# Patient Record
Sex: Male | Born: 1940 | Race: Black or African American | Hispanic: No | Marital: Married | Smoking: Former smoker
Health system: Southern US, Community
[De-identification: ages and names within clinical notes are randomized; demographics above are authoritative.]

## PROBLEM LIST (undated history)

## (undated) DIAGNOSIS — J449 Chronic obstructive pulmonary disease, unspecified: Secondary | ICD-10-CM

## (undated) DIAGNOSIS — R0902 Hypoxemia: Secondary | ICD-10-CM

## (undated) DIAGNOSIS — N179 Acute kidney failure, unspecified: Secondary | ICD-10-CM

## (undated) DIAGNOSIS — I959 Hypotension, unspecified: Secondary | ICD-10-CM

## (undated) DIAGNOSIS — R079 Chest pain, unspecified: Secondary | ICD-10-CM

## (undated) DIAGNOSIS — E119 Type 2 diabetes mellitus without complications: Secondary | ICD-10-CM

## (undated) DIAGNOSIS — G4733 Obstructive sleep apnea (adult) (pediatric): Secondary | ICD-10-CM

## (undated) DIAGNOSIS — I739 Peripheral vascular disease, unspecified: Secondary | ICD-10-CM

## (undated) DIAGNOSIS — I509 Heart failure, unspecified: Secondary | ICD-10-CM

## (undated) DIAGNOSIS — J40 Bronchitis, not specified as acute or chronic: Secondary | ICD-10-CM

## (undated) DIAGNOSIS — F0390 Unspecified dementia without behavioral disturbance: Secondary | ICD-10-CM

## (undated) DIAGNOSIS — J9600 Acute respiratory failure, unspecified whether with hypoxia or hypercapnia: Secondary | ICD-10-CM

## (undated) DIAGNOSIS — J9610 Chronic respiratory failure, unspecified whether with hypoxia or hypercapnia: Secondary | ICD-10-CM

## (undated) DIAGNOSIS — R600 Localized edema: Secondary | ICD-10-CM

## (undated) DIAGNOSIS — I10 Essential (primary) hypertension: Secondary | ICD-10-CM

## (undated) DIAGNOSIS — Z720 Tobacco use: Secondary | ICD-10-CM

## (undated) DIAGNOSIS — E785 Hyperlipidemia, unspecified: Secondary | ICD-10-CM

## (undated) HISTORY — DX: Hypotension, unspecified: I95.9

## (undated) HISTORY — DX: Obstructive sleep apnea (adult) (pediatric): G47.33

## (undated) HISTORY — DX: Chronic obstructive pulmonary disease, unspecified: J44.9

## (undated) HISTORY — DX: Hyperlipidemia, unspecified: E78.5

## (undated) HISTORY — DX: Hypoxemia: R09.02

## (undated) HISTORY — DX: Chronic respiratory failure, unspecified whether with hypoxia or hypercapnia: J96.10

## (undated) HISTORY — DX: Peripheral vascular disease, unspecified: I73.9

## (undated) HISTORY — DX: Essential (primary) hypertension: I10

## (undated) HISTORY — DX: Acute respiratory failure, unspecified whether with hypoxia or hypercapnia: J96.00

## (undated) HISTORY — DX: Heart failure, unspecified: I50.9

## (undated) HISTORY — DX: Type 2 diabetes mellitus without complications: E11.9

## (undated) HISTORY — DX: Acute kidney failure, unspecified: N17.9

## (undated) HISTORY — DX: Localized edema: R60.0

## (undated) HISTORY — DX: Bronchitis, not specified as acute or chronic: J40

## (undated) HISTORY — DX: Tobacco use: Z72.0

## (undated) HISTORY — PX: COLON SURGERY: SHX602

## (undated) HISTORY — DX: Chest pain, unspecified: R07.9

---

## 1997-10-20 ENCOUNTER — Emergency Department (HOSPITAL_COMMUNITY): Admission: EM | Admit: 1997-10-20 | Discharge: 1997-10-20 | Payer: Self-pay | Admitting: Emergency Medicine

## 1997-10-31 ENCOUNTER — Emergency Department (HOSPITAL_COMMUNITY): Admission: EM | Admit: 1997-10-31 | Discharge: 1997-10-31 | Payer: Self-pay | Admitting: Emergency Medicine

## 1997-11-03 ENCOUNTER — Emergency Department (HOSPITAL_COMMUNITY): Admission: EM | Admit: 1997-11-03 | Discharge: 1997-11-03 | Payer: Self-pay | Admitting: Emergency Medicine

## 2004-07-18 ENCOUNTER — Ambulatory Visit: Payer: Self-pay | Admitting: Oncology

## 2005-07-22 ENCOUNTER — Encounter: Admission: RE | Admit: 2005-07-22 | Discharge: 2005-07-22 | Payer: Self-pay | Admitting: Orthopedic Surgery

## 2009-01-27 ENCOUNTER — Emergency Department (HOSPITAL_COMMUNITY): Admission: EM | Admit: 2009-01-27 | Discharge: 2009-01-27 | Payer: Self-pay | Admitting: Emergency Medicine

## 2009-01-27 ENCOUNTER — Encounter (INDEPENDENT_AMBULATORY_CARE_PROVIDER_SITE_OTHER): Payer: Self-pay | Admitting: Emergency Medicine

## 2009-01-27 ENCOUNTER — Ambulatory Visit: Payer: Self-pay | Admitting: Vascular Surgery

## 2010-06-18 ENCOUNTER — Emergency Department (HOSPITAL_COMMUNITY): Payer: MEDICARE

## 2010-06-18 ENCOUNTER — Emergency Department (HOSPITAL_COMMUNITY)
Admission: EM | Admit: 2010-06-18 | Discharge: 2010-06-18 | Disposition: A | Discharge status: Home / Self Care | Payer: MEDICARE | Attending: Emergency Medicine | Admitting: Emergency Medicine

## 2010-06-18 DIAGNOSIS — M25519 Pain in unspecified shoulder: Secondary | ICD-10-CM | POA: Insufficient documentation

## 2010-06-18 DIAGNOSIS — R079 Chest pain, unspecified: Secondary | ICD-10-CM | POA: Insufficient documentation

## 2010-06-18 DIAGNOSIS — R109 Unspecified abdominal pain: Secondary | ICD-10-CM | POA: Insufficient documentation

## 2010-06-18 DIAGNOSIS — E119 Type 2 diabetes mellitus without complications: Secondary | ICD-10-CM | POA: Insufficient documentation

## 2010-06-18 DIAGNOSIS — I10 Essential (primary) hypertension: Secondary | ICD-10-CM | POA: Insufficient documentation

## 2010-06-18 LAB — TROPONIN I: Troponin I: 0.02 ng/mL (ref 0.00–0.06)

## 2010-06-18 LAB — URINALYSIS, ROUTINE W REFLEX MICROSCOPIC
Bilirubin Urine: NEGATIVE
Ketones, ur: NEGATIVE mg/dL
Specific Gravity, Urine: 1.028 (ref 1.005–1.030)
Urine Glucose, Fasting: NEGATIVE mg/dL
Urobilinogen, UA: 1 mg/dL (ref 0.0–1.0)

## 2010-06-18 LAB — COMPREHENSIVE METABOLIC PANEL
Albumin: 4.1 g/dL (ref 3.5–5.2)
GFR calc non Af Amer: >60 mL/min (ref >60)
Potassium: 3.6 mEq/L (ref 3.5–5.1)
Sodium: 139 mEq/L (ref 135–145)

## 2010-06-18 LAB — CBC
HCT: 53.3 % — ABNORMAL HIGH (ref 39.0–52.0)
Hemoglobin: 17.4 g/dL — ABNORMAL HIGH (ref 13.0–17.0)
MCH: 28 pg (ref 26.0–34.0)
MCHC: 32.6 g/dL (ref 30.0–36.0)
RDW: 12.7 % (ref 11.5–15.5)

## 2010-06-18 LAB — POCT CARDIAC MARKERS
CKMB, poc: 12.9 ng/mL (ref 1.0–8.0)
CKMB, poc: 4.2 ng/mL (ref 1.0–8.0)
Myoglobin, poc: 124 ng/mL (ref 12–200)

## 2010-06-18 LAB — DIFFERENTIAL
Basophils Absolute: 0 10*3/uL (ref 0.0–0.1)
Eosinophils Absolute: 0 10*3/uL (ref 0.0–0.7)
Lymphocytes Relative: 11 % — ABNORMAL LOW (ref 12–46)
Neutro Abs: 11.6 10*3/uL — ABNORMAL HIGH (ref 1.7–7.7)

## 2010-06-18 LAB — URINE MICROSCOPIC-ADD ON

## 2010-06-18 LAB — LIPASE, BLOOD: Lipase: 15 U/L (ref 11–59)

## 2010-06-18 LAB — CK TOTAL AND CKMB (NOT AT ARMC): CK, MB: 18.2 ng/mL (ref 0.3–4.0)

## 2010-08-22 LAB — GLUCOSE, CAPILLARY: Glucose-Capillary: 222 mg/dL — ABNORMAL HIGH (ref 70–99)

## 2010-11-04 ENCOUNTER — Inpatient Hospital Stay (HOSPITAL_COMMUNITY)
Admission: EM | Admit: 2010-11-04 | Discharge: 2010-11-11 | DRG: 208 | Disposition: A | Discharge status: Home Health | Payer: Medicare Other | Attending: Emergency Medicine | Admitting: Emergency Medicine

## 2010-11-04 ENCOUNTER — Emergency Department (HOSPITAL_COMMUNITY): Payer: Medicare Other

## 2010-11-04 DIAGNOSIS — I10 Essential (primary) hypertension: Secondary | ICD-10-CM | POA: Diagnosis present

## 2010-11-04 DIAGNOSIS — J9600 Acute respiratory failure, unspecified whether with hypoxia or hypercapnia: Principal | ICD-10-CM | POA: Diagnosis present

## 2010-11-04 DIAGNOSIS — Z91199 Patient's noncompliance with other medical treatment and regimen due to unspecified reason: Secondary | ICD-10-CM

## 2010-11-04 DIAGNOSIS — N139 Obstructive and reflux uropathy, unspecified: Secondary | ICD-10-CM | POA: Diagnosis present

## 2010-11-04 DIAGNOSIS — J441 Chronic obstructive pulmonary disease with (acute) exacerbation: Secondary | ICD-10-CM | POA: Diagnosis present

## 2010-11-04 DIAGNOSIS — R0902 Hypoxemia: Secondary | ICD-10-CM | POA: Diagnosis present

## 2010-11-04 DIAGNOSIS — J9620 Acute and chronic respiratory failure, unspecified whether with hypoxia or hypercapnia: Secondary | ICD-10-CM

## 2010-11-04 DIAGNOSIS — G4733 Obstructive sleep apnea (adult) (pediatric): Secondary | ICD-10-CM | POA: Diagnosis present

## 2010-11-04 DIAGNOSIS — R4182 Altered mental status, unspecified: Secondary | ICD-10-CM | POA: Diagnosis present

## 2010-11-04 DIAGNOSIS — Z9119 Patient's noncompliance with other medical treatment and regimen: Secondary | ICD-10-CM

## 2010-11-04 DIAGNOSIS — F17200 Nicotine dependence, unspecified, uncomplicated: Secondary | ICD-10-CM | POA: Diagnosis present

## 2010-11-04 DIAGNOSIS — E119 Type 2 diabetes mellitus without complications: Secondary | ICD-10-CM | POA: Diagnosis present

## 2010-11-04 DIAGNOSIS — I959 Hypotension, unspecified: Secondary | ICD-10-CM | POA: Diagnosis present

## 2010-11-04 LAB — CBC
Hemoglobin: 16.3 g/dL (ref 13.0–17.0)
MCH: 27.7 pg (ref 26.0–34.0)
MCHC: 31.5 g/dL (ref 30.0–36.0)
Platelets: ADEQUATE 10*3/uL (ref 150–400)
RDW: 12.9 % (ref 11.5–15.5)

## 2010-11-04 LAB — BASIC METABOLIC PANEL
BUN: 19 mg/dL (ref 6–23)
CO2: 32 mEq/L (ref 19–32)
Creatinine, Ser: 1.09 mg/dL (ref 0.50–1.35)
GFR calc Af Amer: >60 mL/min (ref >60)
GFR calc non Af Amer: >60 mL/min (ref >60)
Glucose, Bld: 217 mg/dL — ABNORMAL HIGH (ref 70–99)

## 2010-11-04 LAB — URINALYSIS, ROUTINE W REFLEX MICROSCOPIC
Leukocytes, UA: NEGATIVE
Specific Gravity, Urine: 1.03 (ref 1.005–1.030)
Urobilinogen, UA: 1 mg/dL (ref 0.0–1.0)

## 2010-11-04 LAB — BLOOD GAS, ARTERIAL
Acid-Base Excess: 3.8 mmol/L — ABNORMAL HIGH (ref 0.0–2.0)
Bicarbonate: 36.8 mEq/L — ABNORMAL HIGH (ref 20.0–24.0)
Drawn by: 257701
Expiratory PAP: 6
FIO2: 0.5 %
FIO2: 0.6 %
O2 Saturation: 96 %
Patient temperature: 98.6
TCO2: 34.3 mmol/L (ref 0–100)
TCO2: 27.7 mmol/L (ref 0–100)
pCO2 arterial: 61.1 mmHg (ref 35.0–45.0)
pO2, Arterial: 85.1 mmHg (ref 80.0–100.0)

## 2010-11-04 LAB — GLUCOSE, CAPILLARY: Glucose-Capillary: 232 mg/dL — ABNORMAL HIGH (ref 70–99)

## 2010-11-04 LAB — PROCALCITONIN: Procalcitonin: 0.12 ng/mL

## 2010-11-04 LAB — CK TOTAL AND CKMB (NOT AT ARMC)
CK, MB: 30.4 ng/mL (ref 0.3–4.0)
Relative Index: 1.2 (ref 0.0–2.5)

## 2010-11-04 LAB — URINE MICROSCOPIC-ADD ON

## 2010-11-04 LAB — PROTIME-INR
INR: 1.07 (ref 0.00–1.49)
Prothrombin Time: 14.1 seconds (ref 11.6–15.2)

## 2010-11-04 LAB — APTT: aPTT: 30 seconds (ref 24–37)

## 2010-11-04 LAB — ACETAMINOPHEN LEVEL: Acetaminophen (Tylenol), Serum: <15 ug/mL (ref 10–30)

## 2010-11-04 LAB — PRO B NATRIURETIC PEPTIDE: Pro B Natriuretic peptide (BNP): 84 pg/mL (ref 0–125)

## 2010-11-04 LAB — DIFFERENTIAL
Basophils Relative: 0 % (ref 0–1)
Neutrophils Relative %: 74 % (ref 43–77)

## 2010-11-04 LAB — TROPONIN I: Troponin I: <0.3 ng/mL (ref <0.30)

## 2010-11-04 MED ORDER — IOHEXOL 300 MG/ML  SOLN
100.0000 mL | Freq: Once | INTRAMUSCULAR | Status: AC | PRN
  Administered 2010-11-04: 100 mL via INTRAVENOUS

## 2010-11-05 ENCOUNTER — Inpatient Hospital Stay (HOSPITAL_COMMUNITY): Payer: Medicare Other

## 2010-11-05 DIAGNOSIS — I10 Essential (primary) hypertension: Secondary | ICD-10-CM

## 2010-11-05 DIAGNOSIS — J9600 Acute respiratory failure, unspecified whether with hypoxia or hypercapnia: Secondary | ICD-10-CM

## 2010-11-05 DIAGNOSIS — I359 Nonrheumatic aortic valve disorder, unspecified: Secondary | ICD-10-CM

## 2010-11-05 LAB — LEGIONELLA ANTIGEN, URINE

## 2010-11-05 LAB — STREP PNEUMONIAE URINARY ANTIGEN: Strep Pneumo Urinary Antigen: NEGATIVE

## 2010-11-05 LAB — BASIC METABOLIC PANEL
BUN: 25 mg/dL — ABNORMAL HIGH (ref 6–23)
Calcium: 7.4 mg/dL — ABNORMAL LOW (ref 8.4–10.5)
Chloride: 105 mEq/L (ref 96–112)
Creatinine, Ser: 1.28 mg/dL (ref 0.50–1.35)
GFR calc Af Amer: >60 mL/min (ref >60)

## 2010-11-05 LAB — BLOOD GAS, ARTERIAL
Bicarbonate: 28.7 mEq/L — ABNORMAL HIGH (ref 20.0–24.0)
O2 Saturation: 94.1 %
PEEP: 5 cmH2O
pCO2 arterial: 54.9 mmHg — ABNORMAL HIGH (ref 35.0–45.0)
pO2, Arterial: 71.6 mmHg — ABNORMAL LOW (ref 80.0–100.0)

## 2010-11-05 LAB — GLUCOSE, CAPILLARY
Glucose-Capillary: 179 mg/dL — ABNORMAL HIGH (ref 70–99)
Glucose-Capillary: 150 mg/dL — ABNORMAL HIGH (ref 70–99)
Glucose-Capillary: 156 mg/dL — ABNORMAL HIGH (ref 70–99)
Glucose-Capillary: 161 mg/dL — ABNORMAL HIGH (ref 70–99)

## 2010-11-05 LAB — CBC
HCT: 45.1 % (ref 39.0–52.0)
MCH: 27.2 pg (ref 26.0–34.0)
MCV: 89.7 fL (ref 78.0–100.0)
Platelets: 186 10*3/uL (ref 150–400)
RDW: 13.2 % (ref 11.5–15.5)

## 2010-11-05 LAB — CARDIAC PANEL(CRET KIN+CKTOT+MB+TROPI)
Relative Index: 0.8 (ref 0.0–2.5)
Total CK: 1221 U/L — ABNORMAL HIGH (ref 7–232)

## 2010-11-05 LAB — CK TOTAL AND CKMB (NOT AT ARMC)
CK, MB: 17.6 ng/mL (ref 0.3–4.0)
Relative Index: 1.1 (ref 0.0–2.5)

## 2010-11-05 LAB — URINE CULTURE

## 2010-11-05 LAB — MRSA PCR SCREENING: MRSA by PCR: NEGATIVE

## 2010-11-05 LAB — TROPONIN I: Troponin I: <0.3 ng/mL (ref <0.30)

## 2010-11-06 ENCOUNTER — Inpatient Hospital Stay (HOSPITAL_COMMUNITY): Payer: Medicare Other

## 2010-11-06 LAB — CBC
Hemoglobin: 13.1 g/dL (ref 13.0–17.0)
MCH: 27.2 pg (ref 26.0–34.0)
MCHC: 30.6 g/dL (ref 30.0–36.0)
Platelets: 158 10*3/uL (ref 150–400)
RDW: 13.4 % (ref 11.5–15.5)

## 2010-11-06 LAB — CULTURE, RESPIRATORY W GRAM STAIN

## 2010-11-06 LAB — GLUCOSE, CAPILLARY
Glucose-Capillary: 164 mg/dL — ABNORMAL HIGH (ref 70–99)
Glucose-Capillary: 168 mg/dL — ABNORMAL HIGH (ref 70–99)
Glucose-Capillary: 190 mg/dL — ABNORMAL HIGH (ref 70–99)

## 2010-11-06 LAB — BLOOD GAS, ARTERIAL
Bicarbonate: 25.8 mEq/L — ABNORMAL HIGH (ref 20.0–24.0)
O2 Saturation: 93.3 %
PEEP: 5 cmH2O
TCO2: 22.9 mmol/L (ref 0–100)
pO2, Arterial: 66.9 mmHg — ABNORMAL LOW (ref 80.0–100.0)

## 2010-11-06 LAB — BASIC METABOLIC PANEL
Calcium: 7.1 mg/dL — ABNORMAL LOW (ref 8.4–10.5)
GFR calc Af Amer: >60 mL/min (ref >60)
GFR calc non Af Amer: >60 mL/min (ref >60)
Glucose, Bld: 151 mg/dL — ABNORMAL HIGH (ref 70–99)
Sodium: 141 mEq/L (ref 135–145)

## 2010-11-07 ENCOUNTER — Inpatient Hospital Stay (HOSPITAL_COMMUNITY): Payer: Medicare Other

## 2010-11-07 LAB — CBC
HCT: 49.1 % (ref 39.0–52.0)
MCV: 87.5 fL (ref 78.0–100.0)
RBC: 5.61 MIL/uL (ref 4.22–5.81)
WBC: 19 10*3/uL — ABNORMAL HIGH (ref 4.0–10.5)

## 2010-11-07 LAB — BASIC METABOLIC PANEL
BUN: 29 mg/dL — ABNORMAL HIGH (ref 6–23)
Chloride: 104 mEq/L (ref 96–112)
Creatinine, Ser: 0.99 mg/dL (ref 0.50–1.35)
GFR calc Af Amer: >60 mL/min (ref >60)
GFR calc non Af Amer: >60 mL/min (ref >60)

## 2010-11-07 LAB — GLUCOSE, CAPILLARY
Glucose-Capillary: 231 mg/dL — ABNORMAL HIGH (ref 70–99)
Glucose-Capillary: 157 mg/dL — ABNORMAL HIGH (ref 70–99)
Glucose-Capillary: 159 mg/dL — ABNORMAL HIGH (ref 70–99)

## 2010-11-08 ENCOUNTER — Inpatient Hospital Stay (HOSPITAL_COMMUNITY): Payer: Medicare Other

## 2010-11-08 LAB — BASIC METABOLIC PANEL
Calcium: 8 mg/dL — ABNORMAL LOW (ref 8.4–10.5)
GFR calc Af Amer: >60 mL/min (ref >60)
GFR calc non Af Amer: >60 mL/min (ref >60)
Glucose, Bld: 205 mg/dL — ABNORMAL HIGH (ref 70–99)
Potassium: 4.3 mEq/L (ref 3.5–5.1)
Sodium: 137 mEq/L (ref 135–145)

## 2010-11-08 LAB — GLUCOSE, CAPILLARY
Glucose-Capillary: 226 mg/dL — ABNORMAL HIGH (ref 70–99)
Glucose-Capillary: 248 mg/dL — ABNORMAL HIGH (ref 70–99)
Glucose-Capillary: 214 mg/dL — ABNORMAL HIGH (ref 70–99)
Glucose-Capillary: 268 mg/dL — ABNORMAL HIGH (ref 70–99)
Glucose-Capillary: 265 mg/dL — ABNORMAL HIGH (ref 70–99)

## 2010-11-09 ENCOUNTER — Inpatient Hospital Stay (HOSPITAL_COMMUNITY): Payer: Medicare Other

## 2010-11-09 DIAGNOSIS — I10 Essential (primary) hypertension: Secondary | ICD-10-CM

## 2010-11-09 DIAGNOSIS — J9600 Acute respiratory failure, unspecified whether with hypoxia or hypercapnia: Secondary | ICD-10-CM

## 2010-11-09 LAB — PROCALCITONIN: Procalcitonin: <0.1 ng/mL

## 2010-11-09 LAB — GLUCOSE, CAPILLARY
Glucose-Capillary: 228 mg/dL — ABNORMAL HIGH (ref 70–99)
Glucose-Capillary: 347 mg/dL — ABNORMAL HIGH (ref 70–99)

## 2010-11-09 LAB — CBC
Hemoglobin: 14.4 g/dL (ref 13.0–17.0)
MCH: 27.3 pg (ref 26.0–34.0)
MCHC: 31.1 g/dL (ref 30.0–36.0)
RDW: 13.5 % (ref 11.5–15.5)

## 2010-11-10 ENCOUNTER — Inpatient Hospital Stay (HOSPITAL_COMMUNITY): Payer: Medicare Other

## 2010-11-10 DIAGNOSIS — J9600 Acute respiratory failure, unspecified whether with hypoxia or hypercapnia: Secondary | ICD-10-CM

## 2010-11-10 DIAGNOSIS — I10 Essential (primary) hypertension: Secondary | ICD-10-CM

## 2010-11-10 LAB — BASIC METABOLIC PANEL
Calcium: 8.4 mg/dL (ref 8.4–10.5)
Chloride: 99 mEq/L (ref 96–112)
Creatinine, Ser: 0.88 mg/dL (ref 0.50–1.35)
GFR calc Af Amer: >60 mL/min (ref >60)
Sodium: 134 mEq/L — ABNORMAL LOW (ref 135–145)

## 2010-11-10 LAB — CULTURE, BLOOD (ROUTINE X 2)
Culture  Setup Time: 201206192333
Culture  Setup Time: 201206192333
Culture: NO GROWTH

## 2010-11-10 LAB — CBC
MCH: 26.9 pg (ref 26.0–34.0)
MCV: 87.1 fL (ref 78.0–100.0)
Platelets: 103 10*3/uL — ABNORMAL LOW (ref 150–400)
RBC: 5.43 MIL/uL (ref 4.22–5.81)
RDW: 13.2 % (ref 11.5–15.5)
WBC: 13.4 10*3/uL — ABNORMAL HIGH (ref 4.0–10.5)

## 2010-11-10 LAB — PRO B NATRIURETIC PEPTIDE: Pro B Natriuretic peptide (BNP): 80.4 pg/mL (ref 0–125)

## 2010-11-10 LAB — GLUCOSE, CAPILLARY
Glucose-Capillary: 213 mg/dL — ABNORMAL HIGH (ref 70–99)
Glucose-Capillary: 224 mg/dL — ABNORMAL HIGH (ref 70–99)

## 2010-11-11 DIAGNOSIS — J9600 Acute respiratory failure, unspecified whether with hypoxia or hypercapnia: Secondary | ICD-10-CM

## 2010-11-11 DIAGNOSIS — I10 Essential (primary) hypertension: Secondary | ICD-10-CM

## 2010-11-11 LAB — CBC
HCT: 46.2 % (ref 39.0–52.0)
MCH: 26.7 pg (ref 26.0–34.0)
MCHC: 30.5 g/dL (ref 30.0–36.0)
MCV: 87.5 fL (ref 78.0–100.0)
Platelets: 102 10*3/uL — ABNORMAL LOW (ref 150–400)
RDW: 13.1 % (ref 11.5–15.5)
WBC: 11.5 10*3/uL — ABNORMAL HIGH (ref 4.0–10.5)

## 2010-11-11 LAB — BASIC METABOLIC PANEL
BUN: 25 mg/dL — ABNORMAL HIGH (ref 6–23)
Calcium: 8.6 mg/dL (ref 8.4–10.5)
Creatinine, Ser: 0.85 mg/dL (ref 0.50–1.35)
GFR calc Af Amer: >60 mL/min (ref >60)
GFR calc non Af Amer: >60 mL/min (ref >60)

## 2010-11-12 LAB — GLUCOSE, CAPILLARY: Glucose-Capillary: 155 mg/dL — ABNORMAL HIGH (ref 70–99)

## 2010-11-18 ENCOUNTER — Encounter: Payer: Self-pay | Admitting: *Deleted

## 2010-11-18 ENCOUNTER — Ambulatory Visit (INDEPENDENT_AMBULATORY_CARE_PROVIDER_SITE_OTHER): Payer: Medicare Other | Admitting: Adult Health

## 2010-11-18 VITALS — BP 108/66 | HR 93 | Temp 98.6°F | Ht 66.0 in | Wt 179.0 lb

## 2010-11-18 DIAGNOSIS — J449 Chronic obstructive pulmonary disease, unspecified: Secondary | ICD-10-CM

## 2010-11-18 NOTE — Progress Notes (Signed)
  Subjective:    Patient ID: Ivan Parks, male    DOB: 1940/06/14, 70 y.o.   MRN: [id number]  HPI 70 yo male seen for initial pulmonary consult 11/04/10 for AECOPD w/ vent depend resp failure . Active smoker.   11/18/2010 Post Hospital  Pt was admitted 6/19-6/26/12 for AECOPD requiring vent support. Admitted with acute resp distress found to have Hypercarbic resp failure and vent support. CT chest and head were neg. 2D echo showed LV hypertrophy, EF of 60%.  TX with IV abx, steroids, and nebs.  He was weaned off vent to O2. Did require HOme O2 for ambulatory desaturations.  Discharged on Advair 250/50 Twice daily  .   Since discharge he is feeling better with decreased dyspnea . No smoking since discharge.   Review of Systems Constitutional:   No  weight loss, night sweats,  Fevers, chills, fatigue, or  lassitude.  HEENT:   No headaches,  Difficulty swallowing,  Tooth/dental problems, or  Sore throat,                No sneezing, itching, ear ache, nasal congestion, post nasal drip,   CV:  No chest pain,  Orthopnea, PND, swelling in lower extremities, anasarca, dizziness, palpitations, syncope.   GI  No heartburn, indigestion, abdominal pain, nausea, vomiting, diarrhea, change in bowel habits, loss of appetite, bloody stools.   Resp: .  No excess mucus, no productive cough,  No non-productive cough,  No coughing up of blood.  No change in color of mucus.  No wheezing.  No chest wall deformity  Skin: no rash or lesions.  GU: no dysuria, change in color of urine, no urgency or frequency.  No flank pain, no hematuria   MS:  No joint pain or swelling.  No decreased range of motion.  No back pain.  Psych:  No change in mood or affect. No depression or anxiety.  No memory loss.         Objective:   Physical Exam GEN: A/Ox3; pleasant , NAD, elderly   HEENT:  [place]/AT,  EACs-clear, TMs-wnl, NOSE-clear, THROAT-clear, no lesions, no postnasal drip or exudate noted.   NECK:  Supple w/  fair ROM; no JVD; normal carotid impulses w/o bruits; no thyromegaly or nodules palpated; no lymphadenopathy.  RESP  Coarse BS w/ no wheezing  no accessory muscle use, no dullness to percussion  CARD:  RRR, no m/r/g  , no peripheral edema, pulses intact, no cyanosis or clubbing.  GI:   Soft & nt; nml bowel sounds; no organomegaly or masses detected.  Musco: Warm bil, no deformities or joint swelling noted.   Neuro: alert, no focal deficits noted.    Skin: Warm, no lesions or rashes         Assessment & Plan:

## 2010-11-18 NOTE — Patient Instructions (Signed)
Continue Advair 250/50 1 puff Twice daily  -brush/rinse and gargle after use.  Continue on O2 2.5 l/m continuously  No smoking  follow up 3 weeks Dr. Delton Coombes with PFTs

## 2010-11-19 DIAGNOSIS — J449 Chronic obstructive pulmonary disease, unspecified: Secondary | ICD-10-CM

## 2010-11-19 HISTORY — DX: Chronic obstructive pulmonary disease, unspecified: J44.9

## 2010-11-19 NOTE — Assessment & Plan Note (Addendum)
Flare >>Pt was admitted 6/19-6/26/12 for AECOPD requiring vent support. Admitted with acute resp distress found to have Hypercarbic resp failure and vent support. CT chest and head were neg. 2D echo showed LV hypertrophy, EF of 60%.  TX with IV abx, steroids, and nebs.  He was weaned off vent to O2. Did require HOme O2 for ambulatory desaturations.  Discharged on Advair 250/50 Twice daily  .  >improving , encouraged on smoking cesstation   Plan  Continue Advair 250/50 1 puff Twice daily  -brush/rinse and gargle after use.  Continue on O2 2.5 l/m continuously  No smoking  follow up 3 weeks Dr. Delton Coombes with PFTs

## 2010-11-20 NOTE — Discharge Summary (Signed)
NAMEADEM, [place]                 ACCOUNT NO.:  [id number]  MEDICAL RECORD NO.:  [id number]  LOCATION:  1435                         FACILITY:  [facility]  PHYSICIAN:  Charlaine Dalton. Sherene Sires, MD, FCCPDATE OF BIRTH:  1940/08/07  DATE OF ADMISSION:  11/04/2010 DATE OF DISCHARGE:  11/11/2010                              DISCHARGE SUMMARY   PULMONOLOGIST:  Dr. Levy Pupa of pulmonary division, will be his follow-up pulmonologist.  Dr. Laurann Montana of [facility].  DISCHARGE DIAGNOSES:  Consists of: 1. 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 2. Altered mental status. 3. Hypotension. 4. Hypertension. 5. Diabetes mellitus. 6. Obstructive uropathy.  HISTORY OF PRESENT ILLNESS:  [name] is a 70 year old African American male, who presented to the Emergency Department of [facility] with acute hypercarbic respiratory failure.  He is intubated by physician and pulmonary critical care was asked to admit.  Very little information was available at the time of admit.  He is known to have purulent drainage without radiographic evidence of pneumonia.  He has been a lifelong smoker and noted to be noncompliance with all medications.  LINES AND TUBES:  Had an endotracheal tube from November 04, 2010 to November 07, 2010.  MICRO DATA:  Negative.  ANTIBIOTICS:  Consist of Avelox until discontinued.  He was on sedation protocol from November 04, 2010 to November 06, 2010.  DIAGNOSTIC STUDIES:  CT of the chest was negative.  CT of the head was negative.  He does have sinus disease.  On November 05, 2010, 2-D echo showed wall thickness, increased left ventricular hypertrophy, ejection fraction of 60%.  LABORATORY DATA:  For further lab data, hemoglobin 41; hematocrit 46.2; platelets are 102,000, down from 108,000, eventually came to 186,000. Sodium 137, potassium 4.3, chloride 97, CO2 is 35, BUN is 35,  creatinine is 0.85, glucose went from 206-92 with initiation of metformin. Troponin I less than 0.30.  CK-MB is 9.6.  CK is 1221.  Calcium is 8.6. PFT's prior to discharge showed only mild airflow obstruction  RADIOGRAPHIC DATA:  Chest x-ray on November 10, 2010 demonstrates significant improvement of bilateral lung aeration.  HOSPITAL COURSE BY DISCHARGE DIAGNOSES: 1. Vent dependent respiratory failure secondary to acute exacerbation     of COPD with purulent tracheobronchitis and hypercarbia in the     setting of continued tobacco abuse:  He was initially admitted to     the hospital and intubated.  He was successfully liberated from     mechanical ventilatory support by November 06, 2010.  He is treated     with steroids and bronchodilators and he was treated for Avelox for     H. flu.  He completed antimicrobial therapy.  He was ambulating in     the hall on room air showed sats of 85%, which documented his need     for oxygen.  He will be discharged on home O2 at 2 liters 24x7.  He     will follow up initially with Dr. Rubye Oaks and then with Dr.     Levy Pupa for continued pulmonary issues. 2.  Altered mental status:  He has a CT of the head that was negative.     His altered mental status resolved on November 07, 2010, although he     does continue to have a somewhat blurred affect. 3. Hypotension, which resolved. 4. Hypertension:  This has been treated with antihypertensive. 5. Diabetes mellitus with steroid exacerbation:  He has been in     control now with Glucophage 1000 mg b.i.d.  He has been instructed     to follow up with his primary care physician Dr. Laurann Montana     within 1-2 weeks.  He will be left on metformin at this time.  He     was being taught be given diabetic teaching and taught how to use     the glucometer and he may be overcome off the metformin in the near     future. 6. Obstructive uropathy:  He had a Foley, which was discontinued.  He     had difficulty  in voiding and the bladder scan was negative.  We     will start him on Flomax with resolution of his uropathy symptoms.  DISCHARGE MEDICATIONS:  Note, he was taking no medications that he was supposed to be taking as an outpatient needed.  NEW MEDICATIONS: 1. Clonidine 0.1 mg twice daily. 2. Doxazosin Cardura 4 mg twice daily. 3. Advair 250/50 one puff inhaled daily. 4. Metformin 500 mg by mouth twice daily and that is for a total of     1000 mg twice daily. 5. Benicar 20 mg by mouth daily.  DIET:  His diet is a low carbohydrate heart healthy diet.  FOLLOWUP:  He is going to follow up with Dr. Rubye Oaks on November 18, 2010 at 3 p.m.  Also, he is to follow up with Dr. Delton Coombes in the near future and he is to follow with Dr. Laurann Montana at [facility] on [address].  DISCHARGE INSTRUCTIONS:  Further, he is going to be on home O2 at 2 liters for 24x7.  He has been given special instructions to check his glucose daily.  CONDITION ON DISCHARGE:  He is being discharged in improved condition.  Note, this was prolonged and difficult discharge taking greater than 70 minutes.     Devra Dopp, MSN, ACNP   ______________________________ Charlaine Dalton. Sherene Sires, MD, FCCP    SM/MEDQ  D:  11/11/2010  T:  11/11/2010  Job:  [id number]  cc:   Leslye Peer, MD [address] [place], [place] [address]  Aram Beecham Dr. Cliffton Asters  Electronically Signed by Devra Dopp MSN ACNP on 11/13/2010 03:11:52 PM Electronically Signed by Sandrea Hughs MD FCCP on 11/20/2010 11:25:21 AM

## 2010-12-04 ENCOUNTER — Encounter: Payer: Self-pay | Admitting: Internal Medicine

## 2010-12-19 ENCOUNTER — Ambulatory Visit (INDEPENDENT_AMBULATORY_CARE_PROVIDER_SITE_OTHER): Payer: Medicare Other | Admitting: Emergency Medicine

## 2010-12-19 ENCOUNTER — Encounter: Payer: Self-pay | Admitting: Emergency Medicine

## 2010-12-19 VITALS — BP 120/78 | HR 102 | Temp 98.3°F | Ht 66.0 in | Wt 181.0 lb

## 2010-12-19 DIAGNOSIS — J449 Chronic obstructive pulmonary disease, unspecified: Secondary | ICD-10-CM

## 2010-12-19 DIAGNOSIS — J4489 Other specified chronic obstructive pulmonary disease: Secondary | ICD-10-CM

## 2010-12-19 LAB — PULMONARY FUNCTION TEST

## 2010-12-19 NOTE — Progress Notes (Signed)
PFT done today. 

## 2010-12-19 NOTE — Progress Notes (Signed)
  Subjective:    Patient ID: Ivan Parks, male    DOB: [birth year], 70 y.o.   MRN: [id number] HPI 70 yo male seen for initial pulmonary consult 11/04/10 for AECOPD w/ vent depend resp failure . Active smoker.   11/18/2010 Post Hospital  Pt was admitted 6/19-6/26/12 for AECOPD requiring vent support. Admitted with acute resp distress found to have Hypercarbic resp failure and vent support. CT chest and head were neg. 2D echo showed LV hypertrophy, EF of 60%.  TX with IV abx, steroids, and nebs.  He was weaned off vent to O2. Did require HOme O2 for ambulatory desaturations.  Discharged on Advair 250/50 Twice daily  .   Since discharge he is feeling better with decreased dyspnea . No smoking since discharge.   ROV 12/19/10 -- follows up for COPD, had PFT today as below - severe AFL, no BD response, normal volumes, decreased DLCO that corrects for Va. He is wearing 2.5L/min. Tells me that he has been doing well since d/c from the hospital. His breathing and cough are better. No wheezing. He has not restarted smoking. Has been taking Advair bid.    Review of Systems  Constitutional:   No  weight loss, night sweats,  Fevers, chills, fatigue, or  lassitude.  HEENT:   No headaches,  Difficulty swallowing,  Tooth/dental problems, or  Sore throat,                No sneezing, itching, ear ache, nasal congestion, post nasal drip,   CV:  No chest pain,  Orthopnea, PND, swelling in lower extremities, anasarca, dizziness, palpitations, syncope.   GI  No heartburn, indigestion, abdominal pain, nausea, vomiting, diarrhea, change in bowel habits, loss of appetite, bloody stools.   Resp: .  No excess mucus, no productive cough,  No non-productive cough,  No coughing up of blood.  No change in color of mucus.  No wheezing.  No chest wall deformity  Skin: no rash or lesions.  GU: no dysuria, change in color of urine, no urgency or frequency.  No flank pain, no hematuria   MS:  No joint pain or swelling.  No  decreased range of motion.  No back pain.  Psych:  No change in mood or affect. No depression or anxiety.  No memory loss.     Objective:   GEN: A/Ox3; pleasant , NAD, elderly   HEENT:  [place]/AT,  EACs-clear, TMs-wnl, NOSE-clear, THROAT-clear, no lesions, no postnasal drip or exudate noted.   NECK:  Supple w/ fair ROM; no JVD; normal carotid impulses w/o bruits; no thyromegaly or nodules palpated; no lymphadenopathy.  RESP  Coarse BS w/ no wheezing  no accessory muscle use, no dullness to percussion  CARD:  RRR, no m/r/g  , no peripheral edema, pulses intact, no cyanosis or clubbing.  Musco: Warm bil, no deformities or joint swelling noted.   Neuro: alert, no focal deficits noted.    Skin: Warm, no lesions or rashes   Assessment & Plan:  COPD (chronic obstructive pulmonary disease) Will continue Advair, O2 at all times ROV in 6 mon, discuss possibly adding Spiriva next time.

## 2010-12-19 NOTE — Assessment & Plan Note (Signed)
Will continue Advair, O2 at all times ROV in 6 mon, discuss possibly adding Spiriva next time.

## 2010-12-19 NOTE — Patient Instructions (Signed)
Please continue your Advair twice a day Wear your oxygen set at 2.5L/min at all times Follow up with Dr Delton Coombes in 6 months or sooner if you have any problems We will discuss possibly adding a second medication for your breathing at your next visit.

## 2011-01-01 ENCOUNTER — Encounter: Payer: Self-pay | Admitting: Emergency Medicine

## 2011-08-24 ENCOUNTER — Ambulatory Visit: Payer: Medicare Other | Admitting: Adult Health

## 2011-09-10 ENCOUNTER — Encounter: Payer: Self-pay | Admitting: Emergency Medicine

## 2011-09-10 ENCOUNTER — Ambulatory Visit (INDEPENDENT_AMBULATORY_CARE_PROVIDER_SITE_OTHER): Payer: Medicare Other | Admitting: Emergency Medicine

## 2011-09-10 VITALS — BP 158/96 | HR 93 | Temp 98.3°F | Ht 66.0 in | Wt 196.8 lb

## 2011-09-10 DIAGNOSIS — J449 Chronic obstructive pulmonary disease, unspecified: Secondary | ICD-10-CM
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
## 2011-09-10 NOTE — Assessment & Plan Note (Signed)
-   continue advair - trial spiriva to see if he benefits - O2 - add nasal saline for dryness and throat irritation

## 2011-09-10 NOTE — Patient Instructions (Addendum)
Please continue your Advair twice a day We will try using Spiriva 1 inhalation daily. Keep track of your symptoms so we can decide next visit whether this medication has been helpful.  Try using nasal saline spray several times a day to keep your nose and throat moist.  Continue your oxygen at all times.  Please follow up with Dr Delton Coombes in 1 month or sooner if you have any problems.

## 2011-09-19 ENCOUNTER — Encounter (HOSPITAL_COMMUNITY): Payer: Self-pay | Admitting: Emergency Medicine

## 2011-09-19 ENCOUNTER — Emergency Department (HOSPITAL_COMMUNITY)
Admission: EM | Admit: 2011-09-19 | Discharge: 2011-09-19 | Disposition: A | Discharge status: Home / Self Care | Payer: Medicare Other | Attending: Emergency Medicine | Admitting: Emergency Medicine

## 2011-09-19 DIAGNOSIS — N476 Balanoposthitis: Secondary | ICD-10-CM | POA: Insufficient documentation

## 2011-09-19 DIAGNOSIS — Z79899 Other long term (current) drug therapy: Secondary | ICD-10-CM | POA: Insufficient documentation

## 2011-09-19 DIAGNOSIS — R300 Dysuria: Secondary | ICD-10-CM | POA: Insufficient documentation

## 2011-09-19 DIAGNOSIS — L293 Anogenital pruritus, unspecified: Secondary | ICD-10-CM | POA: Insufficient documentation

## 2011-09-19 DIAGNOSIS — I10 Essential (primary) hypertension: Secondary | ICD-10-CM | POA: Insufficient documentation

## 2011-09-19 DIAGNOSIS — E119 Type 2 diabetes mellitus without complications: Secondary | ICD-10-CM | POA: Insufficient documentation

## 2011-09-19 DIAGNOSIS — N471 Phimosis: Secondary | ICD-10-CM | POA: Insufficient documentation

## 2011-09-19 DIAGNOSIS — N489 Disorder of penis, unspecified: Secondary | ICD-10-CM | POA: Insufficient documentation

## 2011-09-19 DIAGNOSIS — J449 Chronic obstructive pulmonary disease, unspecified: Secondary | ICD-10-CM | POA: Insufficient documentation

## 2011-09-19 DIAGNOSIS — N478 Other disorders of prepuce: Secondary | ICD-10-CM | POA: Insufficient documentation

## 2011-09-19 DIAGNOSIS — N4889 Other specified disorders of penis: Secondary | ICD-10-CM | POA: Insufficient documentation

## 2011-09-19 DIAGNOSIS — J4489 Other specified chronic obstructive pulmonary disease: Secondary | ICD-10-CM | POA: Insufficient documentation

## 2011-09-19 LAB — URINALYSIS, ROUTINE W REFLEX MICROSCOPIC
Bilirubin Urine: NEGATIVE
Ketones, ur: NEGATIVE mg/dL
Leukocytes, UA: NEGATIVE
Nitrite: NEGATIVE
Protein, ur: 100 mg/dL — AB
pH: 6 (ref 5.0–8.0)

## 2011-09-19 LAB — URINE MICROSCOPIC-ADD ON

## 2011-09-19 MED ORDER — CLOTRIMAZOLE 1 % EX CREA: TOPICAL_CREAM | CUTANEOUS | Status: DC

## 2011-09-19 NOTE — Discharge Instructions (Signed)
Balanitis and Foreskin Hygiene Balanitis is a soreness and redness (inflammation) of the head (glans) of the penis. Sometimes there is a discharge, and there may be a mild itch or discomfort. CAUSES   Balanitis is an overgrowth of organisms (such as bacteria or yeast) which are normally present on the skin of the glans.   The condition most most often occurs in men who have a foreskin (have not been circumcised). This provides a warm, moist area for these organisms to grow.   When these organisms overgrow or multiply, they cause inflammation. This is more likely to occur with poor hygiene.   One common organism associated with balanitis is yeast. This yeast is known as Candida albicans. Balanitis may occur because of excessive growth of Candida, due to moisture and warmth under the foreskin.   Treatment of balanitis is usually done by keeping the glans and foreskin clean and dry. Medications usually do not work as well as good Presenter, broadcasting.  HOME CARE INSTRUCTIONS   Once a day, ideally when you shower or bathe, pull the foreskin back towards the body until the glans is uncovered. If there is resistance or discomfort with pulling the foreskin back, check with your caregiver.   Wash the end of the penis and foreskin thoroughly using warm water only. Topical antibiotics, antifungals, or cortisone medications may be used.   After washing, dry the end of the penis and foreskin thoroughly. More thorough drying can be done using a fan or hair dryer.   After drying, replace the foreskin.   When you urinate, slide the foreskin back. This will help keep urine from wetting the foreskin. Following urination, dry the end of the penis and replace the foreskin.   Good hygiene usually leads to rapid improvement in problems. Good hygiene will also help prevent further problems.  SEEK MEDICAL CARE IF:   You experience repeated problems despite good hygiene.   You develop a fever or are unable to urinate.    MAKE SURE YOU:   Understand these instructions.   Will watch your condition.   Will get help right away if you are not doing well or get worse.  Document Released: 07/25/2002 Document Revised: 04/23/2011 Document Reviewed: 08/27/2008 [facility] Patient Information 2012 [place], [place].

## 2011-09-19 NOTE — ED Notes (Signed)
Pt presenting to ed with c/o burning with urination and penile itching x 3 days. Pt states he noticed blood in his underwear this morning. Pt states "something ain't right down there". Pt is alert and oriented at this time.

## 2011-09-19 NOTE — ED Provider Notes (Signed)
History     CSN: [id number]  Arrival date & time 09/19/11  1514   First MD Initiated Contact with Patient 09/19/11 1559      Chief Complaint  Patient presents with  . Dysuria    (Consider location/radiation/quality/duration/timing/severity/associated sxs/prior treatment) Patient is a 71 y.o. male presenting with dysuria. The history is provided by the patient.  Dysuria    patient here complaining of penile pain and itching x2 days. Denies any dysuria or hematuria. States that he's been using a detergent and at bedtime and swelling around his foreskin. Denies any fever abdominal pain or flank pain. Patient notes difficulty retracting his foreskin  Past Medical History  Diagnosis Date  . HTN (hypertension)   . DM (diabetes mellitus)   . COPD (chronic obstructive pulmonary disease)   . Tobacco abuse   . Hypoxemia   . Bronchitis   . OSA (obstructive sleep apnea)   . Acute respiratory failure     History reviewed. No pertinent past surgical history.  No family history on file.  History  Substance Use Topics  . Smoking status: Former Smoker -- 1.5 packs/day for 50 years    Types: Cigarettes    Quit date: 11/04/2010  . Smokeless tobacco: Not on file  . Alcohol Use: No      Review of Systems  Genitourinary: Positive for dysuria.  All other systems reviewed and are negative.    Allergies  Review of patient's allergies indicates no known allergies.  Home Medications   Current Outpatient Rx  Name Route Sig Dispense Refill  . ADVAIR DISKUS 250-50 MCG/DOSE IN AEPB Oral Take 1 puff by mouth Twice daily.    Marland Kitchen DOXAZOSIN MESYLATE 4 MG PO TABS Oral Take 1 tablet by mouth daily.     Marland Kitchen LISINOPRIL-HYDROCHLOROTHIAZIDE 10-12.5 MG PO TABS Oral Take 1 tablet by mouth daily.    Marland Kitchen METFORMIN HCL ER 500 MG PO TB24 Oral Take 1,000 mg by mouth Twice daily.       BP 188/105  Pulse 87  Temp(Src) 98 F (36.7 C) (Oral)  Resp 20  SpO2 96%  Physical Exam  Nursing note and vitals  reviewed. Constitutional: He is oriented to person, place, and time. He appears well-developed and well-nourished.  Non-toxic appearance. No distress.  HENT:  Head: Normocephalic and atraumatic.  Eyes: Conjunctivae, EOM and lids are normal. Pupils are equal, round, and reactive to light.  Neck: Normal range of motion. Neck supple. No tracheal deviation present. No mass present.  Cardiovascular: Normal rate, regular rhythm and normal heart sounds.  Exam reveals no gallop.   No murmur heard. Pulmonary/Chest: Effort normal and breath sounds normal. No stridor. No respiratory distress. He has no decreased breath sounds. He has no wheezes. He has no rhonchi. He has no rales.  Abdominal: Soft. Normal appearance and bowel sounds are normal. He exhibits no distension. There is no tenderness. There is no rebound and no CVA tenderness.  Genitourinary: Uncircumcised. Paraphimosis present.  Musculoskeletal: Normal range of motion. He exhibits no edema and no tenderness.  Neurological: He is alert and oriented to person, place, and time. He has normal strength. No cranial nerve deficit or sensory deficit. GCS eye subscore is 4. GCS verbal subscore is 5. GCS motor subscore is 6.  Skin: Skin is warm and dry. No abrasion and no rash noted.  Psychiatric: He has a normal mood and affect. His speech is normal and behavior is normal.    ED Course  Procedures (including critical care  time)   Labs Reviewed  URINALYSIS, ROUTINE W REFLEX MICROSCOPIC  URINE CULTURE   No results found.   No diagnosis found.    MDM  Pt to be treated for balanoposthitis        Toy Baker, MD 09/19/11 1757

## 2011-09-20 LAB — URINE CULTURE: Culture  Setup Time: 201305042016
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
## 2011-10-08 ENCOUNTER — Ambulatory Visit: Payer: Medicare Other | Admitting: Emergency Medicine

## 2012-06-07 IMAGING — CR DG CHEST 1V PORT
1 series · 1 of 1 positions shown · non-contrast
Comparison: Plain films CT chest 11/04/2010.

CLINICAL DATA: Bradycardia.  Intubated patient.

PORTABLE CHEST - 1 VIEW

[AP]
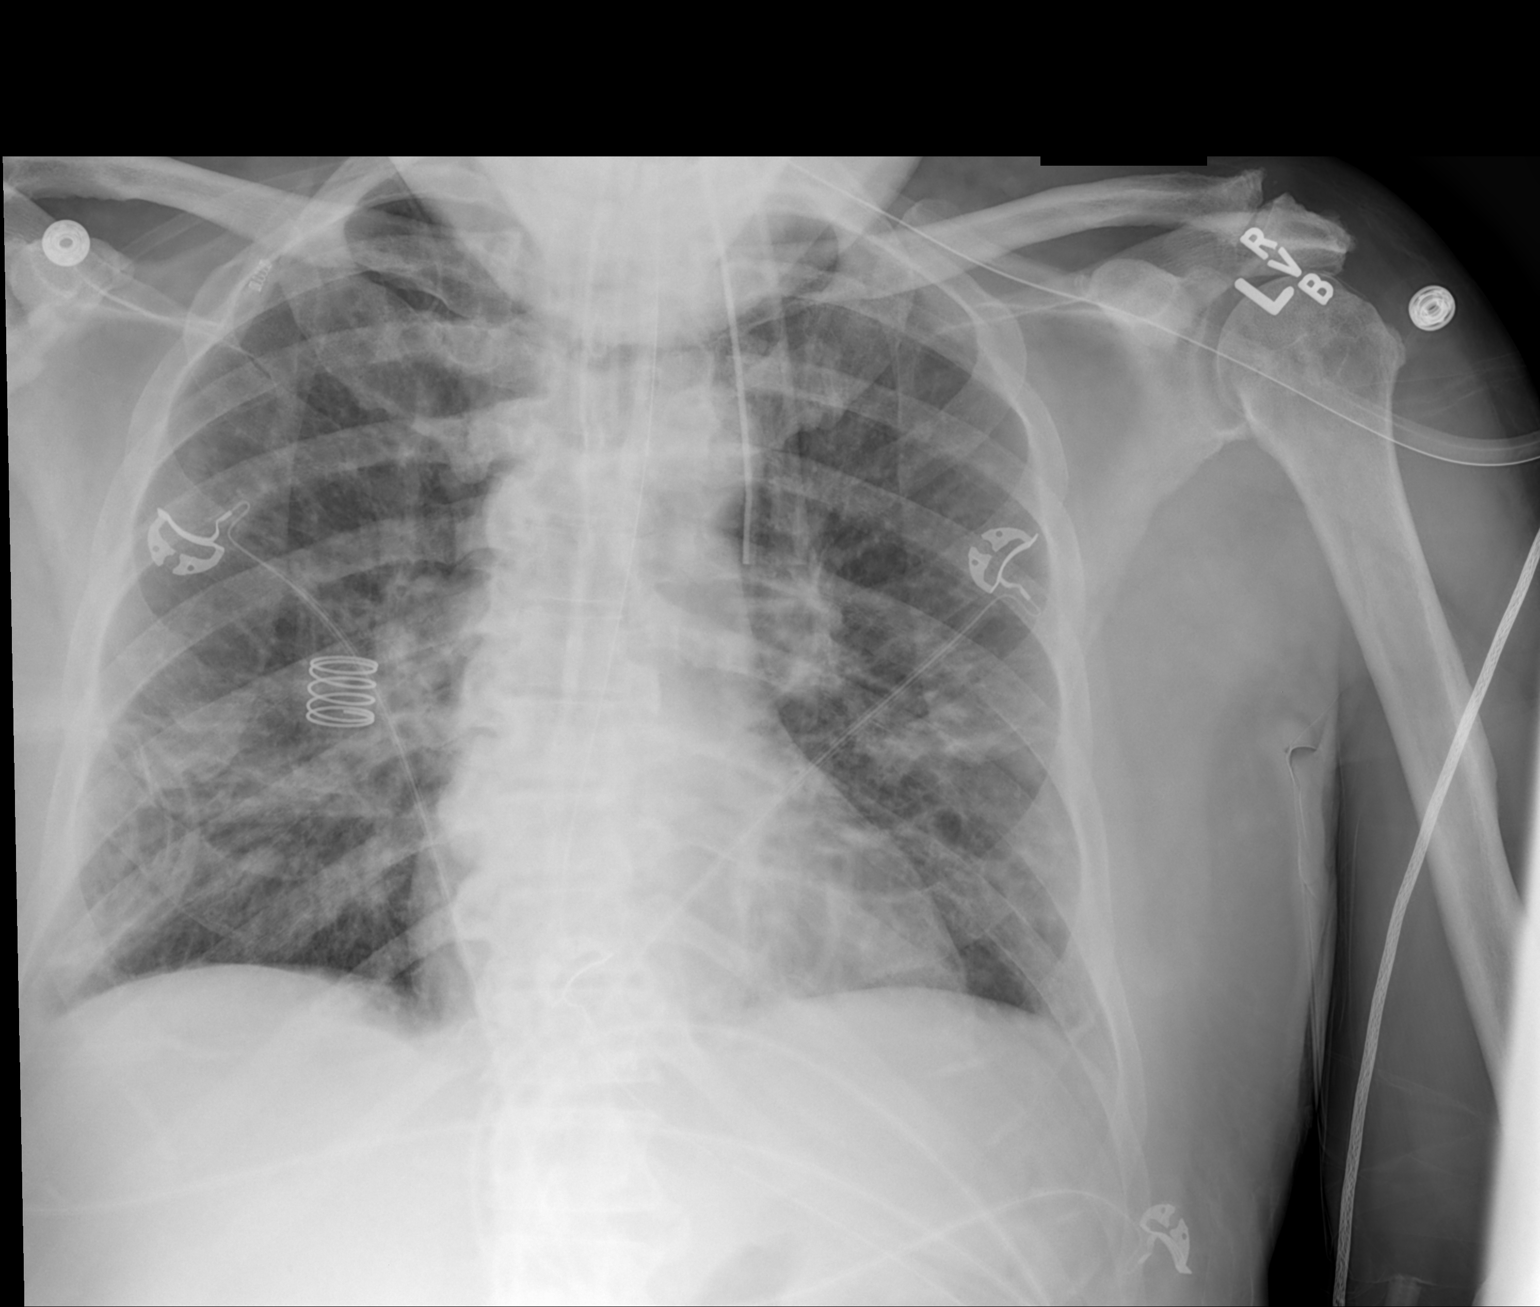

[1 of 1 positions shown; findings below may reference images not displayed]

FINDINGS: Support apparatus is unchanged.  Lung volumes are lower
than on the comparison study with some mild bilateral atelectasis.
Heart size normal.  No pneumothorax or pleural effusion.
IMPRESSION: Some increase in bilateral atelectasis with lung volumes lower than
on yesterday's exam.  No other change.

## 2012-06-10 IMAGING — CR DG CHEST 1V PORT
1 series · 1 of 1 positions shown · non-contrast
Comparison: Portable exam 4041 hours compared to 11/07/2010

CLINICAL DATA: Respiratory failure

PORTABLE CHEST - 1 VIEW

[AP]
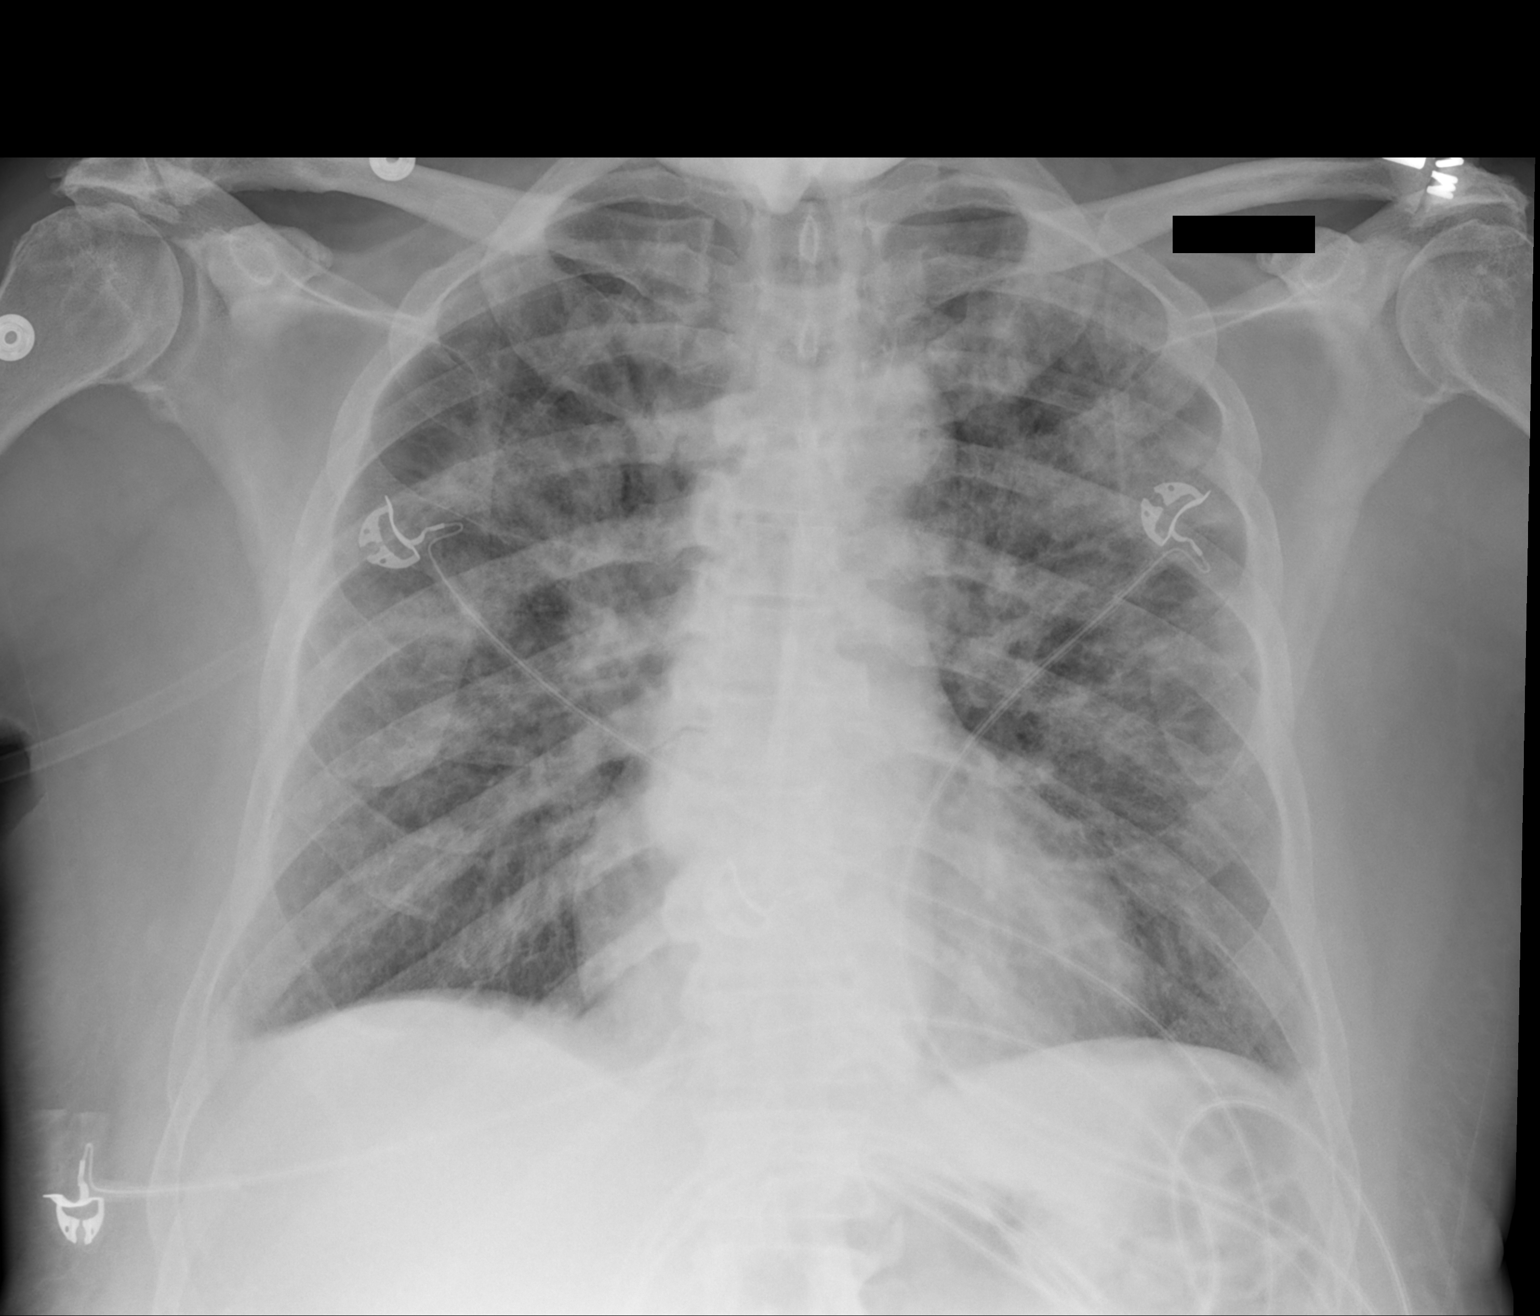

[1 of 1 positions shown; findings below may reference images not displayed]

FINDINGS: Stable heart size and mediastinal contours.
Patchy bilateral pulmonary infiltrates increased since previous
exam.
Probable tiny bilateral pleural effusions.
No pneumothorax.
Degenerative changes of the AC joints bilaterally.
IMPRESSION: Increased bilateral pulmonary infiltrates.

## 2012-06-12 IMAGING — CR DG CHEST 2V
2 series · 2 of 2 positions shown · non-contrast
Comparison: 11/08/2010 and earlier

CLINICAL DATA: Cough - follow-up pneumonia

CHEST - 2 VIEW

[w chest pa]
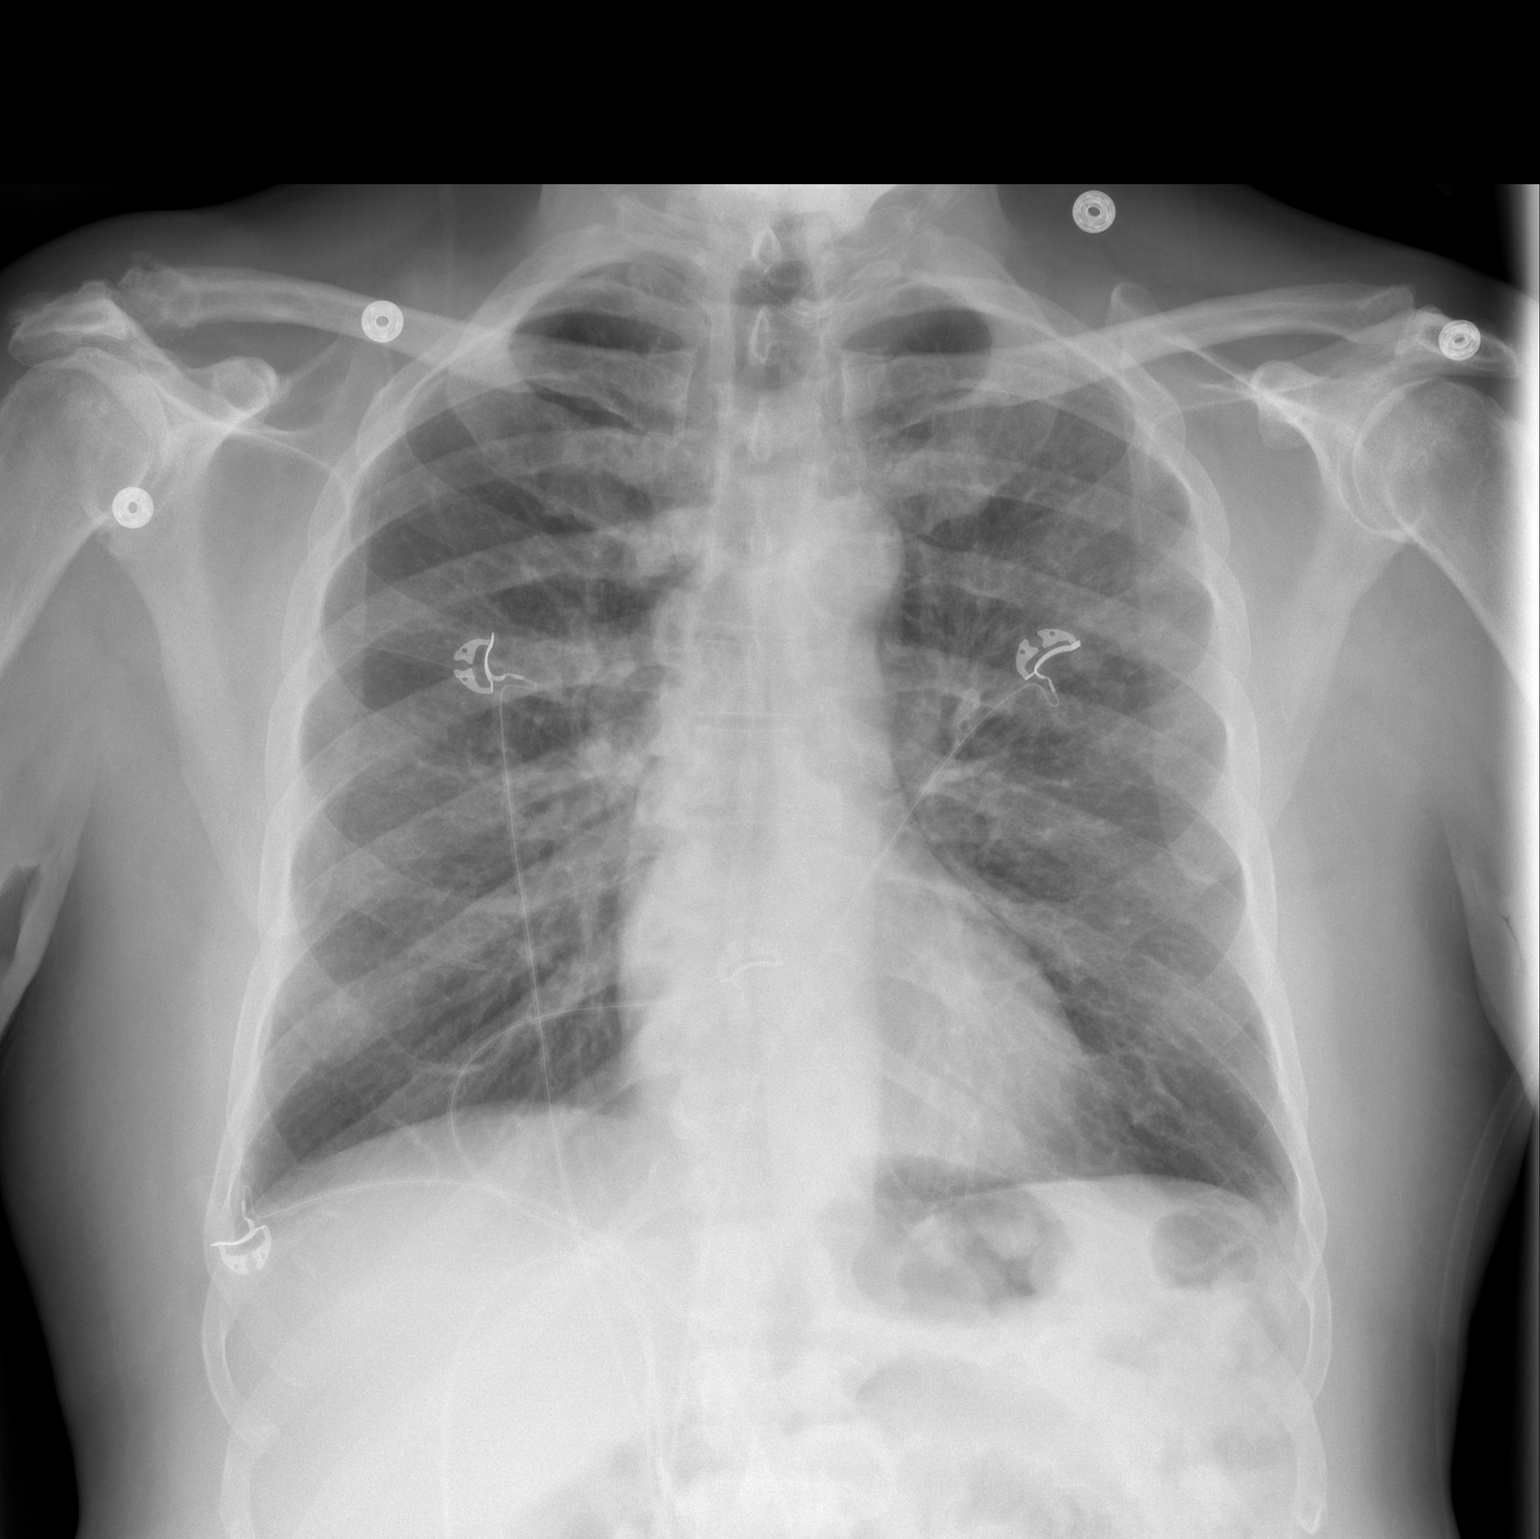

[w chest lat]
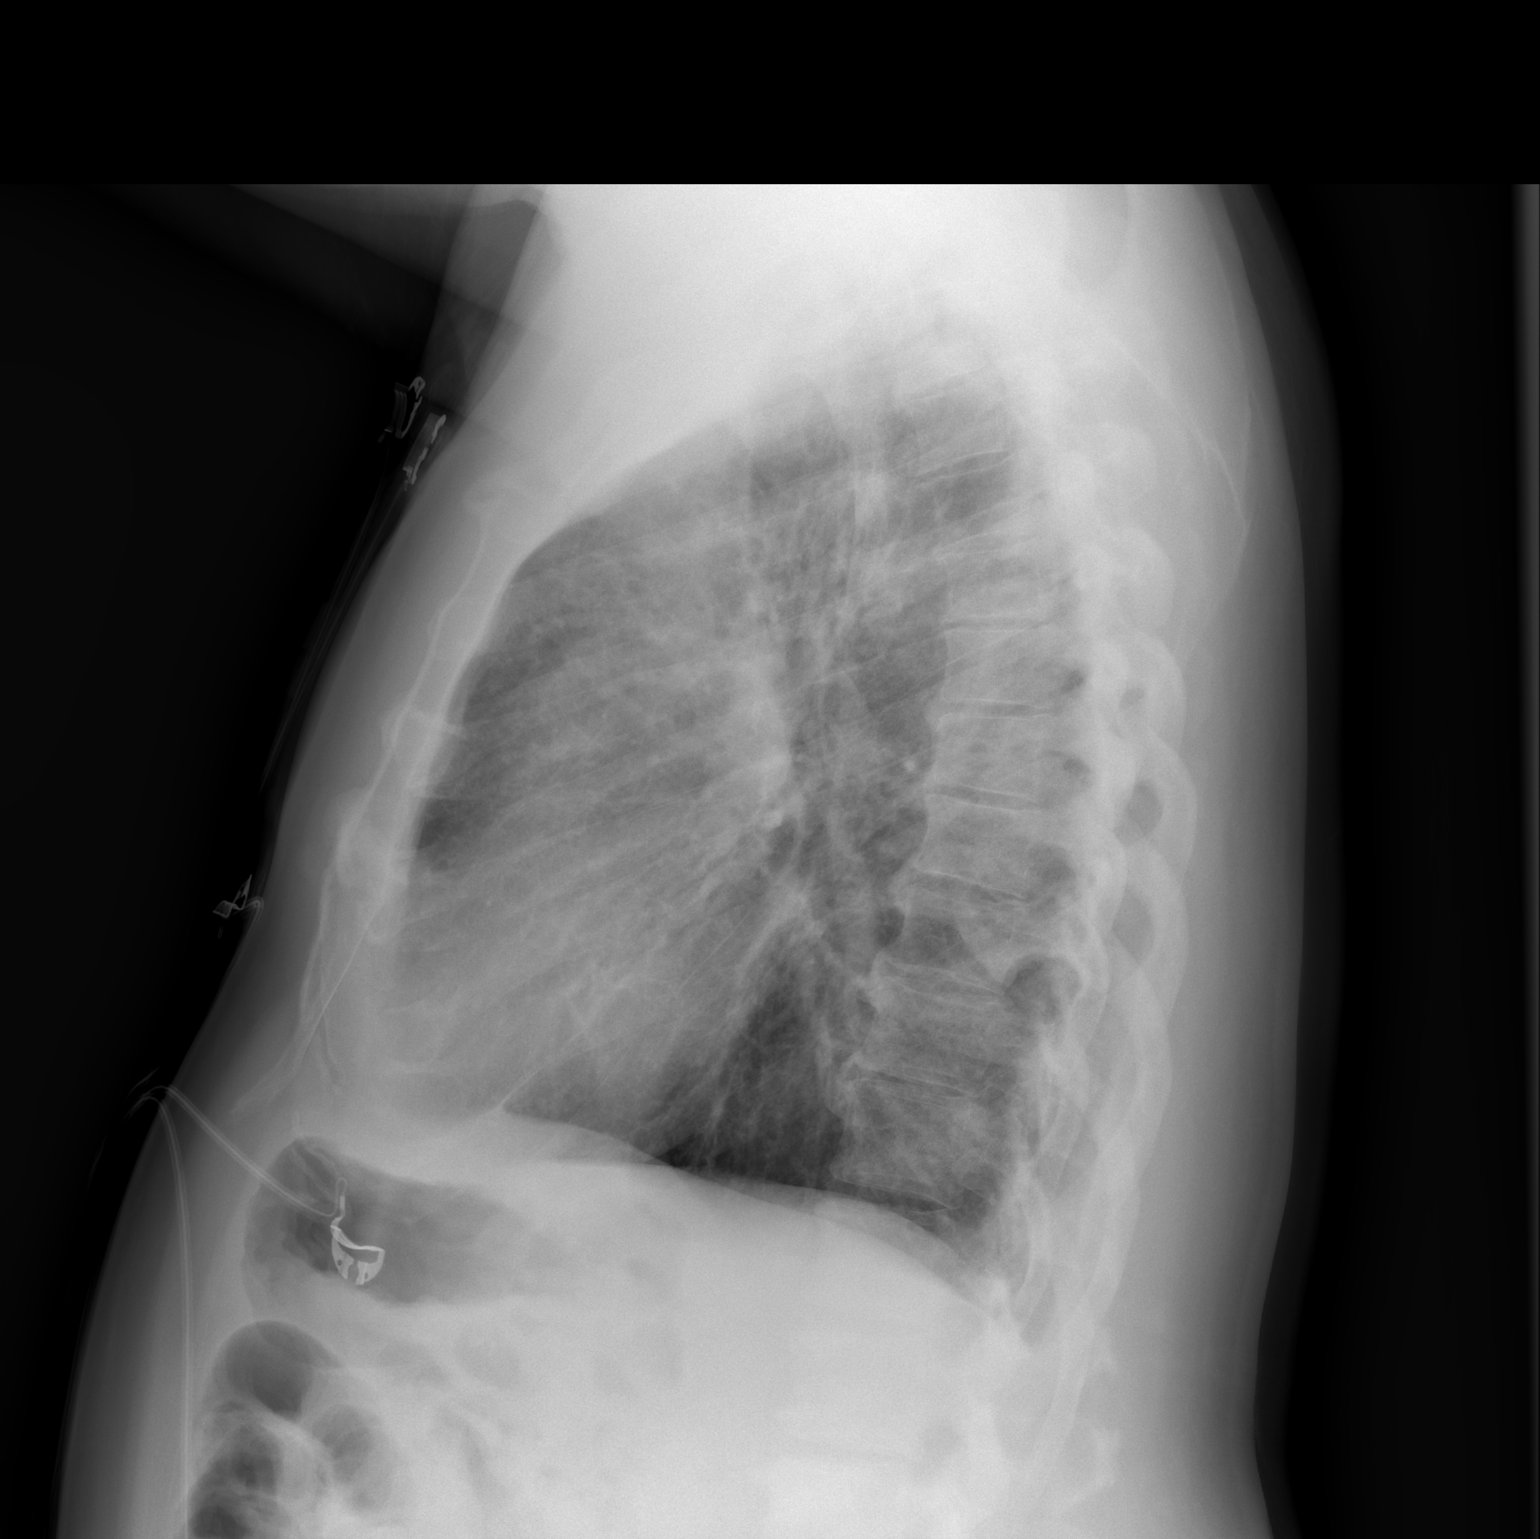

[2 of 2 positions shown; findings below may reference images not displayed]

FINDINGS: Significant interval improvement with incomplete clearing
of the lungs bilaterally.  Heart size normal.  No vascular
congestion or significant pleural fluid.
IMPRESSION: Significant interval improvement in bilateral lung aeration.

## 2012-08-22 ENCOUNTER — Ambulatory Visit (INDEPENDENT_AMBULATORY_CARE_PROVIDER_SITE_OTHER): Payer: Medicare Other | Admitting: Internal Medicine

## 2012-08-22 ENCOUNTER — Encounter: Payer: Self-pay | Admitting: Internal Medicine

## 2012-08-22 VITALS — BP 156/86 | HR 121 | Temp 98.2°F | Ht 66.0 in | Wt 203.2 lb

## 2012-08-22 DIAGNOSIS — J441 Chronic obstructive pulmonary disease with (acute) exacerbation: Secondary | ICD-10-CM

## 2012-08-22 MED ORDER — OSELTAMIVIR PHOSPHATE 75 MG PO CAPS: 75.0000 mg | ORAL_CAPSULE | Freq: Two times a day (BID) | ORAL | Status: DC

## 2012-08-22 MED ORDER — DOXYCYCLINE HYCLATE 100 MG PO TABS: 100.0000 mg | ORAL_TABLET | Freq: Two times a day (BID) | ORAL | Status: DC

## 2012-08-22 MED ORDER — PREDNISONE 10 MG PO TABS: ORAL_TABLET | ORAL | Status: DC

## 2012-08-22 NOTE — Assessment & Plan Note (Signed)
You have mild to moderate attack of copd called COPD exacerbation - Please take doxycycline 100mg  twice daily after meals x 5 days; avoid sunlight - Please take Take prednisone 40 mg daily x 2 days, then 20mg  daily x 2 days, then 10mg  daily x 2 days, then 5mg  daily x 2 days and stop - Take Tamiflu 75 mg twice daily for 5 days (this in case what is going on his influenza because there is still some local flu activity in [place]] - Your wife to talk to primary care physician and take Tamiflu once daily for 10 days in the event she has not had her flu shot  #Followup - As previously scheduled with Dr. Delton Coombes - Come sooner or go to the emergency room if you're getting worse
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
## 2012-08-22 NOTE — Patient Instructions (Addendum)
-   You have mild to moderate attack of copd called COPD exacerbation - Please take doxycycline 100mg  twice daily after meals x 5 days; avoid sunlight - Please take Take prednisone 40 mg daily x 2 days, then 20mg  daily x 2 days, then 10mg  daily x 2 days, then 5mg  daily x 2 days and stop - Take Tamiflu 75 mg twice daily for 5 days (this in case what is going on his influenza because there is still some local flu activity in [place]] - Your wife to talk to primary care physician and take Tamiflu once daily for 10 days in the event she has not had her flu shot  #Followup - As previously scheduled with Dr. Delton Coombes - Come sooner or go to the emergency room if you're getting worse

## 2012-08-29 ENCOUNTER — Telehealth: Payer: Self-pay | Admitting: Emergency Medicine

## 2012-08-29 NOTE — Telephone Encounter (Signed)
Spoke with pt's wife and advised that Tamiflu would have needed to be taken within the first 24-48 hrs of symptoms to be effective.  So no need to take it now.  She states that pt is feeling better now except for some mucus still left on throat.  Instructed ok to take mucinex as directed.

## 2012-09-13 ENCOUNTER — Telehealth: Payer: Self-pay | Admitting: Emergency Medicine

## 2012-09-13 DIAGNOSIS — J449 Chronic obstructive pulmonary disease, unspecified: Secondary | ICD-10-CM

## 2012-09-13 NOTE — Telephone Encounter (Signed)
lmomtcb x1 for pt 

## 2012-09-14 NOTE — Telephone Encounter (Signed)
Order has been placed. Spouse is aware.

## 2012-09-14 NOTE — Telephone Encounter (Signed)
If he has tanks, does this mean he wants a more portable system? A light weight system? If so go ahead and order

## 2012-09-14 NOTE — Telephone Encounter (Signed)
I spoke with spouse. She stated pt is on 2.5 L and he uses the concentrator and has cylinder tanks. Would like an order for pt to be evaluated for portable O2. Please advise if okay to send to [facility]. Please advise RB thanks

## 2012-10-12 ENCOUNTER — Ambulatory Visit: Payer: Medicare Other | Admitting: Emergency Medicine

## 2012-10-13 ENCOUNTER — Telehealth: Payer: Self-pay | Admitting: Emergency Medicine

## 2012-10-13 NOTE — Telephone Encounter (Signed)
I spoke with the pt wife and she states that she went out of town and forgot to let her children know that he had an appt with Dr. Delton Coombes yesterday so he missed the appt. She states this appt has been rescheduled. Nothing further needed. Carron Curie, CMA

## 2012-11-09 ENCOUNTER — Encounter: Payer: Self-pay | Admitting: Emergency Medicine

## 2012-11-09 ENCOUNTER — Ambulatory Visit (INDEPENDENT_AMBULATORY_CARE_PROVIDER_SITE_OTHER): Payer: Medicare Other | Admitting: Emergency Medicine

## 2012-11-09 VITALS — BP 118/72 | HR 115 | Temp 98.0°F | Ht 64.0 in | Wt 211.0 lb

## 2012-11-09 DIAGNOSIS — J309 Allergic rhinitis, unspecified: Secondary | ICD-10-CM | POA: Insufficient documentation

## 2012-11-09 DIAGNOSIS — J449 Chronic obstructive pulmonary disease, unspecified: Secondary | ICD-10-CM

## 2012-11-09 NOTE — Patient Instructions (Addendum)
Please continue your Spiriva and Advair as you are taking them.  Use ProAir 2 puffs up to every 4 hours if needed for shortness of breath Start loratadine 10mg  daily Start nasonex 2 sprays each nostril daily. If this medication helps you, please call our office so we can order the generic version from your pharmacy.  Wear your oxygen at 2.5L/min Follow with Dr Delton Coombes in 4 months or sooner if you have any problems.

## 2012-11-09 NOTE — Assessment & Plan Note (Signed)
-   continue spiriva and advair (he is using the advair qd, will continue this for now since he is doing well) - will get him an albuterol to use prn - rov 4
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
## 2012-11-09 NOTE — Assessment & Plan Note (Signed)
-   start loratadine and nasonex - he will call if the nasal steroid helps him and we will order fluticasone.

## 2013-08-01 ENCOUNTER — Other Ambulatory Visit: Payer: Self-pay | Admitting: Family Medicine

## 2013-08-01 DIAGNOSIS — R0989 Other specified symptoms and signs involving the circulatory and respiratory systems: Secondary | ICD-10-CM

## 2013-08-08 ENCOUNTER — Ambulatory Visit
Admission: RE | Admit: 2013-08-08 | Discharge: 2013-08-08 | Disposition: A | Discharge status: Home / Self Care | Payer: Medicare Other | Source: Ambulatory Visit | Attending: Family Medicine | Admitting: Family Medicine

## 2013-08-08 ENCOUNTER — Other Ambulatory Visit: Payer: Medicare Other

## 2013-08-08 DIAGNOSIS — R0989 Other specified symptoms and signs involving the circulatory and respiratory systems: Secondary | ICD-10-CM

## 2013-10-10 ENCOUNTER — Other Ambulatory Visit: Payer: Self-pay | Admitting: *Deleted

## 2013-10-10 DIAGNOSIS — I739 Peripheral vascular disease, unspecified: Secondary | ICD-10-CM

## 2013-11-08 ENCOUNTER — Encounter: Payer: Self-pay | Admitting: Vascular Surgery

## 2013-11-09 ENCOUNTER — Ambulatory Visit (HOSPITAL_COMMUNITY)
Admission: RE | Admit: 2013-11-09 | Discharge: 2013-11-09 | Disposition: A | Discharge status: Home / Self Care | Payer: Medicare Other | Source: Ambulatory Visit | Attending: Vascular Surgery | Admitting: Vascular Surgery

## 2013-11-09 ENCOUNTER — Encounter: Payer: Self-pay | Admitting: Vascular Surgery

## 2013-11-09 ENCOUNTER — Ambulatory Visit (INDEPENDENT_AMBULATORY_CARE_PROVIDER_SITE_OTHER): Payer: Medicare Other | Admitting: Vascular Surgery

## 2013-11-09 VITALS — BP 131/60 | HR 97 | Ht 64.0 in | Wt 202.5 lb

## 2013-11-09 DIAGNOSIS — I739 Peripheral vascular disease, unspecified: Secondary | ICD-10-CM | POA: Insufficient documentation

## 2013-11-09 NOTE — Progress Notes (Signed)
[facility] [place] HISTORY AND PHYSICAL   CC:  Swelling in right leg  White, Stacie Acresynthia S, MD  HPI: This is a 73 y.o. male who presents today stating that he was referred here for swelling in his right leg.  He states that he has had swelling in his right leg since he was a kid.  He states that this does not give him any problems.  He denies any claudication symptoms.  States that he was told he has some broken bones in his right foot and had a right knee injury in his younger years.  He does have a hx of tobacco use, but quit 3 years ago.  He does have COPD and wears continuous oxygen.  He is on a statin for his cholesterol.  He does take Metformin for his diabetes.  He is on an ACEI for his HTN.  Past Medical History  Diagnosis Date  . HTN (hypertension)   . DM (diabetes mellitus)   . COPD (chronic obstructive pulmonary disease)   . Tobacco abuse   . Hypoxemia   . Bronchitis   . OSA (obstructive sleep apnea)   . Acute respiratory failure    History reviewed. No pertinent past surgical history.  No Known Allergies  Current Outpatient Prescriptions  Medication Sig Dispense Refill  . amLODipine (NORVASC) 5 MG tablet Take 5 mg by mouth daily.      Marland Kitchen. atorvastatin (LIPITOR) 10 MG tablet Take 10 mg by mouth daily.      Marland Kitchen. doxazosin (CARDURA) 4 MG tablet Take 1 tablet by mouth daily.       Marland Kitchen. FLUTICASONE PROPIONATE, INHAL, IN Inhale into the lungs daily.      Marland Kitchen. lisinopril-hydrochlorothiazide (PRINZIDE,ZESTORETIC) 10-12.5 MG per tablet Take 1 tablet by mouth daily.      . metFORMIN (GLUCOPHAGE) 500 MG tablet Take 1,000 mg by mouth 2 (two) times daily with a meal.      . ranitidine (ZANTAC) 300 MG tablet Take 300 mg by mouth at bedtime.      Marland Kitchen. tiotropium (SPIRIVA) 18 MCG inhalation capsule Place 18 mcg into inhaler and inhale daily.      Marland Kitchen. ADVAIR DISKUS 250-50 MCG/DOSE AEPB Take 1 puff by mouth daily.       . Saxagliptin-Metformin (KOMBIGLYZE XR) 2.09-998 MG TB24 Take  2 tablets by mouth daily.       No current facility-administered medications for this visit.    Family History  Problem Relation Age of Onset  . Diabetes Mother   . Hypertension Mother   . Peripheral vascular disease Mother     amputation  . Diabetes Father   . Hypertension Father   . Peripheral vascular disease Father     amputation  . Diabetes Sister   . Hypertension Sister     History   Social History  . Marital Status: Married    Spouse Name: N/A    Number of Children: 7  . Years of Education: N/A   Occupational History  . retired    Social History Main Topics  . Smoking status: Former Smoker -- 1.50 packs/day for 50 years    Types: Cigarettes    Quit date: 11/04/2010  . Smokeless tobacco: Never Used  . Alcohol Use: No  . Drug Use: No  . Sexual Activity: Not on file   Other Topics Concern  . Not on file   Social History Narrative  . No narrative on file     ROS: [  x] Positive   [ ]  Negative   [ ]  All sytems reviewed and are negative  Cardiovascular: []  chest pain/pressure []  palpitations []  SOB lying flat []  DOE []  pain in legs while walking []  pain in feet when lying flat []  hx of DVT []  hx of phlebitis [x]  swelling in legs []  varicose veins  Pulmonary: []  productive cough []  asthma []  wheezing  Neurologic: []  weakness in []  arms []  legs []  numbness in []  arms []  legs [] difficulty speaking or slurred speech []  temporary loss of vision in one eye []  dizziness  Hematologic: []  bleeding problems []  problems with blood clotting easily  GI []  vomiting blood []  blood in stool  GU: []  burning with urination []  blood in urine  Psychiatric: []  hx of major depression  Integumentary: []  rashes []  ulcers  Constitutional: []  fever []  chills   PHYSICAL EXAMINATION:  Filed Vitals:   11/09/13 1351  BP: 131/60  Pulse: 97   Body mass index is 34.74 kg/(m^2).  General:  WDWN in NAD Gait: Not observed HENT: WNL, normocephalic,  but there his neck does have a frozen flexion and he has difficulty extending his neck.  Eyes: Pupils equal Pulmonary: normal non-labored breathing , without Rales, rhonchi,  Wheezing; wearing nasal canula for O2. Cardiac: RRR, without  Murmurs, rubs or gallops; without carotid bruits Abdomen: soft, NT, no masses Skin: without rashes, without ulcers  Vascular Exam/Pulses:  Right Left  Femoral 2+ (normal) 2+ (normal)  DP Non palpable Non palpable  PT Non palpable 2+ (normal)   Extremities: without ischemic changes, without Gangrene , without cellulitis; without open wounds; + edema RLE Musculoskeletal: no muscle wasting or atrophy  Neurologic: A&O X 3; Appropriate Affect ; SENSATION: normal; MOTOR FUNCTION:  moving all extremities equally. Speech is fluent/normal   Non-Invasive Vascular Imaging:   Lower extremity arterial duplex evaluation 11/09/13: 1.  Doppler velocities suggest > 50% stenosis of the right distal SFA 2.  Evidence of possible right anterior tibial artery occlusion as described below  Additional Findings: -Partially occlusive plaque noted in the right distal SFA -No flow noted in the right mid anterior tibial artery with reconstitution distally via collateral  ABI's 10/20/13: Right:  0.80 Left:  1.03  Pt meds includes: Statin:  Yes.   Beta Blocker:  No. Aspirin:  No. ACEI:  Yes.   ARB:  No. Other Antiplatelet/Anticoagulant:  No.   ASSESSMENT/PLAN:: 73 y.o. male with RLE edema and PAD   -the pt does have some swelling in his right lower leg, but he is not having any problems, pain or difficulty from this. -his ABI's are slightly decreased on the right, however, the pt does not have any claudication symptoms or non healing wounds. -will have him follow up in 6 months for repeat ABI's and see NP -he will call sooner if he has any issues.   Doreatha MassedSamantha Rhyne, PA-C [facility] [phone]  Clinic MD:  Pt seen and examined in conjunction with  Dr. Darrick PennaFields  History and exam findings as above. The patient has severe COPD. He was continuous 24-hour oxygen. He is unable to lay flat due to his severe COPD. He has a chronic asymptomatic swollen right leg. He denies any claudication symptoms. His right ABI is slightly decreased at 0.8 he is certainly not at risk of limb loss. He denies any claudication symptoms. Best course of management at this point would be continued observation in nature his ABIs cannot decline over time. Will followup in  6 months time with repeat ABIs. If he develops claudication symptoms we would consider intervention however, due to his severe COPD I doubt that he is able to walk long enough to elicit any symptoms. He is currently not at risk of limb loss.  Fabienne Bruns, MD [facility] [place] Office: [phone] Pager: [phone]

## 2014-05-01 ENCOUNTER — Encounter (HOSPITAL_BASED_OUTPATIENT_CLINIC_OR_DEPARTMENT_OTHER): Payer: Medicare Other | Attending: General Surgery

## 2014-05-01 DIAGNOSIS — L97819 Non-pressure chronic ulcer of other part of right lower leg with unspecified severity: Secondary | ICD-10-CM | POA: Insufficient documentation

## 2014-05-01 DIAGNOSIS — I87331 Chronic venous hypertension (idiopathic) with ulcer and inflammation of right lower extremity: Secondary | ICD-10-CM | POA: Diagnosis not present

## 2014-05-02 NOTE — H&P (Signed)
NAMJuliane Parks:  Spiers, Ivan Parks                 ACCOUNT NO.:  [id number]  MEDICAL RECORD NO.:  [id number]  LOCATION:  FOOT                         FACILITY:  [facility]  PHYSICIAN:  [name], M.D.        DATE OF BIRTH:  10-26-1940  DATE OF ADMISSION:  05/01/2014 DATE OF DISCHARGE:                             HISTORY & PHYSICAL   CHIEF COMPLAINT:  Wound, right foreleg.  HISTORY OF PRESENT ILLNESS:  The patient developed a sizable wound approximately 1 month ago in the right foreleg.  This is associated in the area of previous trauma and has been treated at home with antibiotic ointment and peroxide washes and has almost healed.  There is no pain. No evidence of infection.  PAST MEDICAL HISTORY:  Significant for hypertension, hyperlipidemia, diabetes type 2, COPD, sleep apnea, peripheral vascular disease and GERD.  PAST SURGICAL HISTORY:  Cyst of the colon was removed.  SOCIAL HISTORY:  Cigarettes; he quit 3 years ago.  Alcohol; none.  MEDICATIONS:  Advair, Spiriva, nasal oxygen, Flonase, lisinopril, doxazosin, atorvastatin, metformin, pioglitazone, Norvasc and insulin.  REVIEW OF SYSTEMS:  Essentially as above.  PHYSICAL EXAMINATION:  VITAL SIGNS:  Temp 97.6, pulse 96, respirations 20, blood pressure 156/77, blood glucose is 144. GENERAL APPEARANCE: Well developed, well nourished, nasal oxygen in place. CHEST:  Clear. HEART:  Regular rhythm. EXTREMITIES:  Examination of the lower extremity reveals the right lower extremity has considerable edema in the foot and foreleg.  Peripheral pulses are not palpable.  There is a tiny wound draining some serous fluid in the anterior foreleg and there was an area of recently healed skin.  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  PLAN OF TREATMENT:  We will get arterial and venous studies.  We will wrap the wound and treat it with a silver alginate at present.   I believe the wound will heal very rapidly, but I think there is significant arterial and venous disease present.     Joanne Gaveloy Ivan Parks, M.D.     RA/MEDQ  D:  05/01/2014  T:  05/02/2014  Job:  [id number]

## 2014-05-08 DIAGNOSIS — I87331 Chronic venous hypertension (idiopathic) with ulcer and inflammation of right lower extremity: Secondary | ICD-10-CM | POA: Diagnosis not present

## 2014-05-08 DIAGNOSIS — L97819 Non-pressure chronic ulcer of other part of right lower leg with unspecified severity: Secondary | ICD-10-CM | POA: Diagnosis not present

## 2014-05-16 ENCOUNTER — Encounter (HOSPITAL_COMMUNITY): Payer: Medicare Other

## 2014-05-16 ENCOUNTER — Ambulatory Visit: Payer: Medicare Other | Admitting: Family

## 2014-05-23 ENCOUNTER — Encounter: Payer: Self-pay | Admitting: Family

## 2014-05-24 ENCOUNTER — Encounter (HOSPITAL_COMMUNITY): Payer: Medicare Other

## 2014-05-24 ENCOUNTER — Ambulatory Visit: Payer: Medicare Other | Admitting: Family

## 2014-06-13 ENCOUNTER — Encounter: Payer: Self-pay | Admitting: Family

## 2014-06-14 ENCOUNTER — Ambulatory Visit: Payer: Self-pay | Admitting: Family

## 2014-06-14 ENCOUNTER — Encounter (HOSPITAL_COMMUNITY): Payer: Self-pay

## 2014-06-20 ENCOUNTER — Encounter: Payer: Self-pay | Admitting: Family

## 2014-06-21 ENCOUNTER — Ambulatory Visit: Payer: Self-pay | Admitting: Family

## 2014-06-21 ENCOUNTER — Inpatient Hospital Stay (HOSPITAL_COMMUNITY): Admission: RE | Admit: 2014-06-21 | Payer: Self-pay | Source: Ambulatory Visit

## 2014-07-04 ENCOUNTER — Encounter: Payer: Self-pay | Admitting: Family

## 2014-07-05 ENCOUNTER — Encounter: Payer: Self-pay | Admitting: Family

## 2014-07-05 ENCOUNTER — Other Ambulatory Visit: Payer: Self-pay

## 2014-07-05 ENCOUNTER — Ambulatory Visit (HOSPITAL_COMMUNITY)
Admission: RE | Admit: 2014-07-05 | Discharge: 2014-07-05 | Disposition: A | Discharge status: Home / Self Care | Payer: Medicare Other | Source: Ambulatory Visit | Attending: Family | Admitting: Family

## 2014-07-05 ENCOUNTER — Ambulatory Visit (INDEPENDENT_AMBULATORY_CARE_PROVIDER_SITE_OTHER): Payer: Medicare Other | Admitting: Family

## 2014-07-05 VITALS — BP 131/81 | HR 100 | Resp 18 | Ht 65.0 in | Wt 206.0 lb

## 2014-07-05 DIAGNOSIS — Z87891 Personal history of nicotine dependence: Secondary | ICD-10-CM

## 2014-07-05 DIAGNOSIS — R600 Localized edema: Secondary | ICD-10-CM

## 2014-07-05 DIAGNOSIS — I872 Venous insufficiency (chronic) (peripheral): Secondary | ICD-10-CM

## 2014-07-05 DIAGNOSIS — E1165 Type 2 diabetes mellitus with hyperglycemia: Secondary | ICD-10-CM

## 2014-07-05 DIAGNOSIS — E1151 Type 2 diabetes mellitus with diabetic peripheral angiopathy without gangrene: Secondary | ICD-10-CM

## 2014-07-05 DIAGNOSIS — I739 Peripheral vascular disease, unspecified: Secondary | ICD-10-CM

## 2014-07-05 DIAGNOSIS — IMO0002 Reserved for concepts with insufficient information to code with codable children: Secondary | ICD-10-CM

## 2014-07-05 DIAGNOSIS — E1159 Type 2 diabetes mellitus with other circulatory complications: Secondary | ICD-10-CM

## 2014-07-05 HISTORY — DX: Localized edema: R60.0

## 2014-07-05 NOTE — Progress Notes (Signed)
[facility] [place] HISTORY AND PHYSICAL -PAD  History of Present Illness Ivan Parks is a 74 y.o. male patient of Dr. Darrick Penna who presents today stating that he was referred here for swelling in his right leg, returns for scheduled follow up. He states that he has had swelling in his right leg since he was a kid. He states that this does not give him any problems. He denies any claudication symptoms. States that he was told he has some broken bones in his right foot and had a right knee injury in his younger years. He does have a hx of tobacco use, but quit in 2012.  He does have COPD and wears continuous oxygen. He is on a statin for his cholesterol. He does take Metformin for his diabetes. He is on an ACEI for his HTN.  Pt decided not to have the ABI test today, states he has no worsening problems. He was seen by Dr. Wiliam Ke for wound care of his right anterior lower leg, treated by his daughter, wounds healed, 2 visits to wound care and discharged as his wounds were healed. He has chronic venous insufficiency in his right lower leg, by history he does not seem to elevate his legs much, sleeps sitting up with his legs dependent.   Pt Diabetic: Yes, pt states uncontrolled Pt smoker: former smoker, quit in 2012  Pt meds include: Statin :Yes ASA: No, states he has no allergy to ASA, denies bleeding problems, denies GI ulcers or bleeding Other anticoagulants/antiplatelets: no  Past Medical History  Diagnosis Date  . HTN (hypertension)   . DM (diabetes mellitus)   . COPD (chronic obstructive pulmonary disease)   . Tobacco abuse   . Hypoxemia   . Bronchitis   . OSA (obstructive sleep apnea)   . Acute respiratory failure   . CHF (congestive heart failure)     Social History History  Substance Use Topics  . Smoking status: Former Smoker -- 1.50 packs/day for 50 years    Types: Cigarettes    Quit date: 11/04/2010  . Smokeless tobacco: Never Used  .  Alcohol Use: No    Family History Family History  Problem Relation Age of Onset  . Diabetes Mother   . Hypertension Mother   . Peripheral vascular disease Mother     amputation  . Diabetes Father     Right Leg Amputation-Gangrene  . Hypertension Father   . Peripheral vascular disease Father     amputation  . Cancer Father     Lead Poison-Ca  . Pneumonia Father   . Diabetes Sister   . Hypertension Sister   . Diabetes Daughter   . Hypertension Daughter     History reviewed. No pertinent past surgical history.  No Known Allergies  Current Outpatient Prescriptions  Medication Sig Dispense Refill  . ADVAIR DISKUS 250-50 MCG/DOSE AEPB Take 1 puff by mouth daily.     Marland Kitchen amLODipine (NORVASC) 5 MG tablet Take 5 mg by mouth daily.    Marland Kitchen atorvastatin (LIPITOR) 10 MG tablet Take 10 mg by mouth daily.    Marland Kitchen doxazosin (CARDURA) 4 MG tablet Take 1 tablet by mouth daily.     Marland Kitchen lisinopril-hydrochlorothiazide (PRINZIDE,ZESTORETIC) 20-25 MG per tablet Take 1 tablet by mouth daily.  1  . Saxagliptin-Metformin (KOMBIGLYZE XR) 2.09-998 MG TB24 Take 2 tablets by mouth daily.    Marland Kitchen tiotropium (SPIRIVA) 18 MCG inhalation capsule Place 18 mcg into inhaler and inhale daily.    Marland Kitchen  FLUTICASONE PROPIONATE, INHAL, IN Inhale into the lungs daily.    Marland Kitchen. lisinopril-hydrochlorothiazide (PRINZIDE,ZESTORETIC) 10-12.5 MG per tablet Take 1 tablet by mouth daily.    . metFORMIN (GLUCOPHAGE) 500 MG tablet Take 1,000 mg by mouth 2 (two) times daily with a meal.    . ranitidine (ZANTAC) 300 MG tablet Take 300 mg by mouth at bedtime.     No current facility-administered medications for this visit.    ROS: See HPI for pertinent positives and negatives.   Physical Examination  Filed Vitals:   07/05/14 1204  BP: 131/81  Pulse: 100  Resp: 18  Height: 5\' 5"  (1.651 m)  Weight: 206 lb (93.441 kg)  SpO2: 86%   Body mass index is 34.28 kg/(m^2).  General: WDWN in NAD obese male Gait: slow, deliberate, using  cane HENT: WNL, normocephalic, but there his neck does have a frozen flexion and he has difficulty extending his neck.  Eyes: Pupils equal Pulmonary: normal non-labored breathing , without Rales, rhonchi, Wheezing; wearing nasal canula for O2. Cardiac: RRR, with no appreciable murmur; without carotid bruits Abdomen: soft, NT, no palpable masses, large panus Skin: without rashes, without ulcers   Vascular Exam/Pulses:  Right Left  Femoral 2+ (normal) 2+ (normal)  DP Non palpable, biphasic by Doppler Non palpable, monophasic by Doppler  PT Non palpable, monophasic by Doppler Not palpable, monophasic by Doppler   Extremities: without ischemic changes, without Gangrene , without cellulitis; without open wounds; 2+ pitting edema RLE, 1+ pitting edema in left LE. Musculoskeletal: no muscle wasting or atrophy Neurologic: A&O X 3; Appropriate Affect ; SENSATION: normal; MOTOR FUNCTION: moving all extremities equally. Speech is fluent/normal, some hearing loss.          ASSESSMENT: Ivan Parks is a 74 y.o. male who presents with:  -Mild PAD, pt declined ABI's today, pulses are not palpable, are Dopplerable -Chronic venous insufficiency of lower legs, see Plan. -Uncontrolled DM is his major atherosclerotic risk factor. -He is a former smoker.  Face to face time with patient was 25 minutes. Over 50% of this time was spent on counseling and coordination of care.   PLAN:  Take a daily 81 mg ASA for risk reduction of CVD events. Daily seated leg exercises as discussed and demonstrated Knee high 20-30 mmHg compression hose during the day, elevate feet above heart when not walking. Pt advised to work closely with his DM medical provider to get his DM under as good control as possible.  I discussed in depth with the patient the nature of atherosclerosis, and emphasized the importance of maximal medical management including strict control of blood pressure, blood glucose,  and lipid levels, obtaining regular exercise, and continued cessation of smoking.  The patient is aware that without maximal medical management the underlying atherosclerotic disease process will progress, limiting the benefit of any interventions.  Based on the patient's vascular studies and examination, pt will return to clinic in 1 year for ABI's and evaluation of chronic venous insuficiency.  The patient was given information about PAD including signs, symptoms, treatment, what symptoms should prompt the patient to seek immediate medical care, and risk reduction measures to take.  Charisse MarchSuzanne Uchechukwu Dhawan, RN, MSN, FNP-C [facility] [place] Office Phone: [phone]  Clinic MD: Darrick PennaFields  07/05/2014 12:23 PM

## 2014-07-05 NOTE — Patient Instructions (Addendum)
Peripheral Vascular Disease Peripheral Vascular Disease (PVD), also called Peripheral Arterial Disease (PAD), is a circulation problem caused by cholesterol (atherosclerotic plaque) deposits in the arteries. PVD commonly occurs in the lower extremities (legs) but it can occur in other areas of the body, such as your arms. The cholesterol buildup in the arteries reduces blood flow which can cause pain and other serious problems. The presence of PVD can place a person at risk for Coronary Artery Disease (CAD).  CAUSES  Causes of PVD can be many. It is usually associated with more than one risk factor such as:   High Cholesterol.  Smoking.  Diabetes.  Lack of exercise or inactivity.  High blood pressure (hypertension).  Obesity.  Family history. SYMPTOMS   When the lower extremities are affected, patients with PVD may experience:  Leg pain with exertion or physical activity. This is called INTERMITTENT CLAUDICATION. This may present as cramping or numbness with physical activity. The location of the pain is associated with the level of blockage. For example, blockage at the abdominal level (distal abdominal aorta) may result in buttock or hip pain. Lower leg arterial blockage may result in calf pain.  As PVD becomes more severe, pain can develop with less physical activity.  In people with severe PVD, leg pain may occur at rest.  Other PVD signs and symptoms:  Leg numbness or weakness.  Coldness in the affected leg or foot, especially when compared to the other leg.  A change in leg color.  Patients with significant PVD are more prone to ulcers or sores on toes, feet or legs. These may take longer to heal or may reoccur. The ulcers or sores can become infected.  If signs and symptoms of PVD are ignored, gangrene may occur. This can result in the loss of toes or loss of an entire limb.  Not all leg pain is related to PVD. Other medical conditions can cause leg pain such  as:  Blood clots (embolism) or Deep Vein Thrombosis.  Inflammation of the blood vessels (vasculitis).  Spinal stenosis. DIAGNOSIS  Diagnosis of PVD can involve several different types of tests. These can include:  Pulse Volume Recording Method (PVR). This test is simple, painless and does not involve the use of X-rays. PVR involves measuring and comparing the blood pressure in the arms and legs. An ABI (Ankle-Brachial Index) is calculated. The normal ratio of blood pressures is 1. As this number becomes smaller, it indicates more severe disease.  < 0.95 - indicates significant narrowing in one or more leg vessels.  <0.8 - there will usually be pain in the foot, leg or buttock with exercise.  <0.4 - will usually have pain in the legs at rest.  <0.25 - usually indicates limb threatening PVD.  Doppler detection of pulses in the legs. This test is painless and checks to see if you have a pulses in your legs/feet.  A dye or contrast material (a substance that highlights the blood vessels so they show up on x-ray) may be given to help your caregiver better see the arteries for the following tests. The dye is eliminated from your body by the kidney's. Your caregiver may order blood work to check your kidney function and other laboratory values before the following tests are performed:  Magnetic Resonance Angiography (MRA). An MRA is a picture study of the blood vessels and arteries. The MRA machine uses a large magnet to produce images of the blood vessels.  Computed Tomography Angiography (CTA). A CTA   is a specialized x-ray that looks at how the blood flows in your blood vessels. An IV may be inserted into your arm so contrast dye can be injected.  Angiogram. Is a procedure that uses x-rays to look at your blood vessels. This procedure is minimally invasive, meaning a small incision (cut) is made in your groin. A small tube (catheter) is then inserted into the artery of your groin. The catheter  is guided to the blood vessel or artery your caregiver wants to examine. Contrast dye is injected into the catheter. X-rays are then taken of the blood vessel or artery. After the images are obtained, the catheter is taken out. TREATMENT  Treatment of PVD involves many interventions which may include:  Lifestyle changes:  Quitting smoking.  Exercise.  Following a low fat, low cholesterol diet.  Control of diabetes.  Foot care is very important to the PVD patient. Good foot care can help prevent infection.  Medication:  Cholesterol-lowering medicine.  Blood pressure medicine.  Anti-platelet drugs.  Certain medicines may reduce symptoms of Intermittent Claudication.  Interventional/Surgical options:  Angioplasty. An Angioplasty is a procedure that inflates a balloon in the blocked artery. This opens the blocked artery to improve blood flow.  Stent Implant. A wire mesh tube (stent) is placed in the artery. The stent expands and stays in place, allowing the artery to remain open.  Peripheral Bypass Surgery. This is a surgical procedure that reroutes the blood around a blocked artery to help improve blood flow. This type of procedure may be performed if Angioplasty or stent implants are not an option. SEEK IMMEDIATE MEDICAL CARE IF:   You develop pain or numbness in your arms or legs.  Your arm or leg turns cold, becomes blue in color.  You develop redness, warmth, swelling and pain in your arms or legs. MAKE SURE YOU:   Understand these instructions.  Will watch your condition.  Will get help right away if you are not doing well or get worse. Document Released: 06/11/2004 Document Revised: 07/27/2011 Document Reviewed: 05/08/2008 ExitCare Patient Information 2015 [employer]. This information is not intended to replace advice given to you by your health care provider. Make sure you discuss any questions you have with your health care provider.   Venous Stasis or  Chronic Venous Insufficiency Chronic venous insufficiency, also called venous stasis, is a condition that affects the veins in the legs. The condition prevents blood from being pumped through these veins effectively. Blood may no longer be pumped effectively from the legs back to the heart. This condition can range from mild to severe. With proper treatment, you should be able to continue with an active life. CAUSES  Chronic venous insufficiency occurs when the vein walls become stretched, weakened, or damaged or when valves within the vein are damaged. Some common causes of this include:  High blood pressure inside the veins (venous hypertension).  Increased blood pressure in the leg veins from long periods of sitting or standing.  A blood clot that blocks blood flow in a vein (deep vein thrombosis).  Inflammation of a superficial vein (phlebitis) that causes a blood clot to form. RISK FACTORS Various things can make you more likely to develop chronic venous insufficiency, including:  Family history of this condition.  Obesity.  Pregnancy.  Sedentary lifestyle.  Smoking.  Jobs requiring long periods of standing or sitting in one place.  Being a certain age. Women in their 40s and 50s and men in their 70s are more   likely to develop this condition. SIGNS AND SYMPTOMS  Symptoms may include:   Varicose veins.  Skin breakdown or ulcers.  Reddened or discolored skin on the leg.  Brown, smooth, tight, and painful skin just above the ankle, usually on the inside surface (lipodermatosclerosis).  Swelling. DIAGNOSIS  To diagnose this condition, your health care provider will take a medical history and do a physical exam. The following tests may be ordered to confirm the diagnosis:  Duplex ultrasound--A procedure that produces a picture of a blood vessel and nearby organs and also provides information on blood flow through the blood vessel.  Plethysmography--A procedure that tests  blood flow.  A venogram, or venography--A procedure used to look at the veins using X-ray and dye. TREATMENT The goals of treatment are to help you return to an active life and to minimize pain or disability. Treatment will depend on the severity of the condition. Medical procedures may be needed for severe cases. Treatment options may include:   Use of compression stockings. These can help with symptoms and lower the chances of the problem getting worse, but they do not cure the problem.  Sclerotherapy--A procedure involving an injection of a material that "dissolves" the damaged veins. Other veins in the network of blood vessels take over the function of the damaged veins.  Surgery to remove the vein or cut off blood flow through the vein (vein stripping or laser ablation surgery).  Surgery to repair a valve. HOME CARE INSTRUCTIONS   Wear compression stockings as directed by your health care provider.  Only take over-the-counter or prescription medicines for pain, discomfort, or fever as directed by your health care provider.  Follow up with your health care provider as directed. SEEK MEDICAL CARE IF:   You have redness, swelling, or increasing pain in the affected area.  You see a red streak or line that extends up or down from the affected area.  You have a breakdown or loss of skin in the affected area, even if the breakdown is small.  You have an injury to the affected area. SEEK IMMEDIATE MEDICAL CARE IF:   You have an injury and open wound in the affected area.  Your pain is severe and does not improve with medicine.  You have sudden numbness or weakness in the foot or ankle below the affected area, or you have trouble moving your foot or ankle.  You have a fever or persistent symptoms for more than 2-3 days.  You have a fever and your symptoms suddenly get worse. MAKE SURE YOU:   Understand these instructions.  Will watch your condition.  Will get help right  away if you are not doing well or get worse. Document Released: 09/07/2006 Document Revised: 02/22/2013 Document Reviewed: 01/09/2013 ExitCare Patient Information 2015 [employer]. This information is not intended to replace advice given to you by your health care provider. Make sure you discuss any questions you have with your health care provider.  

## 2014-08-13 ENCOUNTER — Encounter (HOSPITAL_COMMUNITY): Payer: Self-pay | Admitting: Emergency Medicine

## 2014-08-13 ENCOUNTER — Emergency Department (HOSPITAL_COMMUNITY)
Admission: EM | Admit: 2014-08-13 | Discharge: 2014-08-13 | Disposition: A | Discharge status: Home / Self Care | Payer: Medicare Other | Attending: Emergency Medicine | Admitting: Emergency Medicine

## 2014-08-13 DIAGNOSIS — R609 Edema, unspecified: Secondary | ICD-10-CM | POA: Diagnosis not present

## 2014-08-13 DIAGNOSIS — Z87891 Personal history of nicotine dependence: Secondary | ICD-10-CM | POA: Insufficient documentation

## 2014-08-13 DIAGNOSIS — E119 Type 2 diabetes mellitus without complications: Secondary | ICD-10-CM | POA: Insufficient documentation

## 2014-08-13 DIAGNOSIS — I10 Essential (primary) hypertension: Secondary | ICD-10-CM | POA: Insufficient documentation

## 2014-08-13 DIAGNOSIS — Z794 Long term (current) use of insulin: Secondary | ICD-10-CM | POA: Diagnosis not present

## 2014-08-13 DIAGNOSIS — I509 Heart failure, unspecified: Secondary | ICD-10-CM | POA: Insufficient documentation

## 2014-08-13 DIAGNOSIS — J449 Chronic obstructive pulmonary disease, unspecified: Secondary | ICD-10-CM | POA: Insufficient documentation

## 2014-08-13 DIAGNOSIS — Z79899 Other long term (current) drug therapy: Secondary | ICD-10-CM | POA: Insufficient documentation

## 2014-08-13 DIAGNOSIS — R600 Localized edema: Secondary | ICD-10-CM

## 2014-08-13 DIAGNOSIS — M7989 Other specified soft tissue disorders: Secondary | ICD-10-CM | POA: Diagnosis present

## 2014-08-13 DIAGNOSIS — Z8669 Personal history of other diseases of the nervous system and sense organs: Secondary | ICD-10-CM | POA: Diagnosis not present

## 2014-08-13 LAB — I-STAT CHEM 8, ED
BUN: 33 mg/dL — AB (ref 6–23)
CALCIUM ION: 1.13 mmol/L (ref 1.13–1.30)
CREATININE: 1.2 mg/dL (ref 0.50–1.35)
Chloride: 95 mmol/L — ABNORMAL LOW (ref 96–112)
Glucose, Bld: 185 mg/dL — ABNORMAL HIGH (ref 70–99)
HCT: 40 % (ref 39.0–52.0)
Hemoglobin: 13.6 g/dL (ref 13.0–17.0)
Potassium: 4.2 mmol/L (ref 3.5–5.1)
Sodium: 140 mmol/L (ref 135–145)
TCO2: 34 mmol/L (ref 0–100)

## 2014-08-13 MED ORDER — FUROSEMIDE 20 MG PO TABS: 20.0000 mg | ORAL_TABLET | Freq: Every day | ORAL | Status: DC

## 2014-08-13 MED ORDER — SULFAMETHOXAZOLE-TRIMETHOPRIM 800-160 MG PO TABS: 1.0000 | ORAL_TABLET | Freq: Two times a day (BID) | ORAL | Status: DC

## 2014-08-13 NOTE — ED Notes (Signed)
Pt c/o right swelling and drainage, pt is diabetic. Pt oxygen sats dropped down to 79 in triage on 2.5 liters in triage, pt c/o SOB as well since last Thursday pt has COPD.

## 2014-08-13 NOTE — Discharge Instructions (Signed)
Follow up with your md Thursday or Friday for recheck

## 2014-08-14 NOTE — ED Provider Notes (Signed)
CSN: [id number]     Arrival date & time 08/13/14  1234 History   First MD Initiated Contact with Patient 08/13/14 1324     Chief Complaint  Patient presents with  . Leg Swelling     (Consider location/radiation/quality/duration/timing/severity/associated sxs/prior Treatment) Patient is a 74 y.o. Parks presenting with leg pain. The history is provided by the patient (the pt complains of swelling to both lower legs).  Leg Pain Lower extremity pain location: swelling below knees to both legs. Injury: no   Pain details:    Quality:  Aching   Radiates to:  Does not radiate   Severity:  Mild   Onset quality:  Gradual   Timing:  Constant   Progression:  Worsening Chronicity:  Recurrent Associated symptoms: no back pain and no fatigue     Past Medical History  Diagnosis Date  . HTN (hypertension)   . DM (diabetes mellitus)   . COPD (chronic obstructive pulmonary disease)   . Tobacco abuse   . Hypoxemia   . Bronchitis   . OSA (obstructive sleep apnea)   . Acute respiratory failure   . CHF (congestive heart failure)    History reviewed. No pertinent past surgical history. Family History  Problem Relation Age of Onset  . Diabetes Mother   . Hypertension Mother   . Peripheral vascular disease Mother     amputation  . Diabetes Father     Right Leg Amputation-Gangrene  . Hypertension Father   . Peripheral vascular disease Father     amputation  . Cancer Father     Lead Poison-Ca  . Pneumonia Father   . Diabetes Sister   . Hypertension Sister   . Diabetes Daughter   . Hypertension Daughter    History  Substance Use Topics  . Smoking status: Former Smoker -- 1.50 packs/day for 50 years    Types: Cigarettes    Quit date: 11/04/2010  . Smokeless tobacco: Never Used  . Alcohol Use: No    Review of Systems  Constitutional: Negative for appetite change and fatigue.  HENT: Negative for congestion, ear discharge and sinus pressure.   Eyes: Negative for discharge.   Respiratory: Negative for cough.   Cardiovascular: Negative for chest pain.  Gastrointestinal: Negative for abdominal pain and diarrhea.  Genitourinary: Negative for frequency and hematuria.  Musculoskeletal: Negative for back pain.       Swelling to both lower legs  Skin: Negative for rash.  Neurological: Negative for seizures and headaches.  Psychiatric/Behavioral: Negative for hallucinations.      Allergies  Review of patient's allergies indicates no known allergies.  Home Medications   Prior to Admission medications   Medication Sig Start Date End Date Taking? Authorizing Provider  ADVAIR DISKUS 250-50 MCG/DOSE AEPB Take 1 puff by mouth daily.  11/11/10  Yes Historical Provider, MD  amLODipine (NORVASC) 5 MG tablet Take 5 mg by mouth daily.   Yes Historical Provider, MD  doxazosin (CARDURA) 4 MG tablet Take 4 mg by mouth daily.  11/11/10  Yes Historical Provider, MD  Insulin Detemir (LEVEMIR [place]) Inject 10 Units into the skin at bedtime. Gets samples from dr   Yes Historical Provider, MD  lisinopril-hydrochlorothiazide (PRINZIDE,ZESTORETIC) 20-25 MG per tablet Take 1 tablet by mouth daily. 12/Ivan/15  Yes Historical Provider, MD  loratadine (CLARITIN) 10 MG tablet Take 10 mg by mouth daily.   Yes Historical Provider, MD  metFORMIN (GLUCOPHAGE) 500 MG tablet Take 1,000 mg by mouth 2 (two) times daily with a  meal.   Yes Historical Provider, MD  tiotropium (SPIRIVA) 18 MCG inhalation capsule Place 18 mcg into inhaler and inhale daily as needed (for shortness of breath). Uses scheduled everyday but may take another puff if he gets short of breath   Yes Historical Provider, MD  furosemide (LASIX) 20 MG tablet Take 1 tablet (20 mg total) by mouth daily. 08/13/14   Bethann BerkshireJoseph Joshwa Hemric, MD  sulfamethoxazole-trimethoprim (SEPTRA DS) 800-160 MG per tablet Take 1 tablet by mouth every 12 (twelve) hours. 08/13/14   Bethann BerkshireJoseph Edilberto Roosevelt, MD   BP 146/65 mmHg  Pulse 79  Temp(Src) 98.1 F (36.7 C) (Oral)  Resp  13  SpO2 97% Physical Exam  Constitutional: He is oriented to person, place, and time. He appears well-developed.  HENT:  Head: Normocephalic.  Eyes: Conjunctivae and EOM are normal. No scleral icterus.  Neck: Neck supple. No thyromegaly present.  Cardiovascular: Normal rate and regular rhythm.  Exam reveals no gallop and no friction rub.   No murmur heard. Pulmonary/Chest: No stridor. He has no wheezes. He has no rales. He exhibits no tenderness.  Abdominal: He exhibits no distension. There is no tenderness. There is no rebound.  Musculoskeletal: Normal range of motion. He exhibits edema.  3 plus edema to both lower legs with possible celulitis  Lymphadenopathy:    He has no cervical adenopathy.  Neurological: He is oriented to person, place, and time. He exhibits normal muscle tone. Coordination normal.  Skin: No rash noted. No erythema.  Psychiatric: He has a normal mood and affect. His behavior is normal.    ED Course  Procedures (including critical care time) Labs Review Labs Reviewed  I-STAT CHEM 8, ED - Abnormal; Notable for the following:    Chloride 95 (*)    BUN 33 (*)    Glucose, Bld 185 (*)    All other components within normal limits    Imaging Review No results found.   EKG Interpretation None      MDM   Final diagnoses:  Bilateral edema of lower extremity        Bethann BerkshireJoseph Vernis Eid, MD 08/14/14 [phone]

## 2014-09-05 ENCOUNTER — Encounter (HOSPITAL_BASED_OUTPATIENT_CLINIC_OR_DEPARTMENT_OTHER): Payer: Medicare Other | Attending: Surgery

## 2014-09-05 DIAGNOSIS — I87311 Chronic venous hypertension (idiopathic) with ulcer of right lower extremity: Secondary | ICD-10-CM | POA: Diagnosis present

## 2014-09-05 DIAGNOSIS — Z7951 Long term (current) use of inhaled steroids: Secondary | ICD-10-CM | POA: Insufficient documentation

## 2014-09-05 DIAGNOSIS — Z792 Long term (current) use of antibiotics: Secondary | ICD-10-CM | POA: Insufficient documentation

## 2014-09-05 DIAGNOSIS — Z9981 Dependence on supplemental oxygen: Secondary | ICD-10-CM | POA: Insufficient documentation

## 2014-09-05 DIAGNOSIS — E119 Type 2 diabetes mellitus without complications: Secondary | ICD-10-CM | POA: Insufficient documentation

## 2014-09-05 DIAGNOSIS — J449 Chronic obstructive pulmonary disease, unspecified: Secondary | ICD-10-CM | POA: Insufficient documentation

## 2014-09-05 DIAGNOSIS — G4730 Sleep apnea, unspecified: Secondary | ICD-10-CM | POA: Insufficient documentation

## 2014-09-05 DIAGNOSIS — Z87891 Personal history of nicotine dependence: Secondary | ICD-10-CM | POA: Insufficient documentation

## 2014-09-05 DIAGNOSIS — L97821 Non-pressure chronic ulcer of other part of left lower leg limited to breakdown of skin: Secondary | ICD-10-CM | POA: Diagnosis not present

## 2014-09-05 DIAGNOSIS — I10 Essential (primary) hypertension: Secondary | ICD-10-CM | POA: Diagnosis not present

## 2014-09-05 DIAGNOSIS — R0902 Hypoxemia: Secondary | ICD-10-CM | POA: Insufficient documentation

## 2014-09-05 DIAGNOSIS — I509 Heart failure, unspecified: Secondary | ICD-10-CM | POA: Insufficient documentation

## 2014-09-05 DIAGNOSIS — L97811 Non-pressure chronic ulcer of other part of right lower leg limited to breakdown of skin: Secondary | ICD-10-CM | POA: Insufficient documentation

## 2014-09-05 DIAGNOSIS — Z794 Long term (current) use of insulin: Secondary | ICD-10-CM | POA: Diagnosis not present

## 2014-09-05 DIAGNOSIS — I890 Lymphedema, not elsewhere classified: Secondary | ICD-10-CM | POA: Insufficient documentation

## 2014-09-10 ENCOUNTER — Ambulatory Visit (HOSPITAL_COMMUNITY)
Admission: RE | Admit: 2014-09-10 | Discharge: 2014-09-10 | Disposition: A | Discharge status: Home / Self Care | Payer: Medicare Other | Source: Ambulatory Visit | Attending: Vascular Surgery | Admitting: Vascular Surgery

## 2014-09-10 ENCOUNTER — Other Ambulatory Visit: Payer: Self-pay | Admitting: Surgery

## 2014-09-10 DIAGNOSIS — I872 Venous insufficiency (chronic) (peripheral): Secondary | ICD-10-CM

## 2014-09-12 DIAGNOSIS — I87311 Chronic venous hypertension (idiopathic) with ulcer of right lower extremity: Secondary | ICD-10-CM | POA: Diagnosis not present

## 2014-09-19 ENCOUNTER — Encounter (HOSPITAL_BASED_OUTPATIENT_CLINIC_OR_DEPARTMENT_OTHER): Payer: Medicare Other | Attending: Surgery

## 2014-09-19 DIAGNOSIS — G4733 Obstructive sleep apnea (adult) (pediatric): Secondary | ICD-10-CM | POA: Insufficient documentation

## 2014-09-19 DIAGNOSIS — R0902 Hypoxemia: Secondary | ICD-10-CM | POA: Insufficient documentation

## 2014-09-19 DIAGNOSIS — J449 Chronic obstructive pulmonary disease, unspecified: Secondary | ICD-10-CM | POA: Insufficient documentation

## 2014-09-19 DIAGNOSIS — I890 Lymphedema, not elsewhere classified: Secondary | ICD-10-CM | POA: Diagnosis not present

## 2014-09-19 DIAGNOSIS — L97811 Non-pressure chronic ulcer of other part of right lower leg limited to breakdown of skin: Secondary | ICD-10-CM | POA: Insufficient documentation

## 2014-09-19 DIAGNOSIS — I87331 Chronic venous hypertension (idiopathic) with ulcer and inflammation of right lower extremity: Secondary | ICD-10-CM | POA: Insufficient documentation

## 2014-09-19 DIAGNOSIS — E119 Type 2 diabetes mellitus without complications: Secondary | ICD-10-CM | POA: Diagnosis not present

## 2014-09-19 DIAGNOSIS — Z87891 Personal history of nicotine dependence: Secondary | ICD-10-CM | POA: Diagnosis not present

## 2014-09-19 DIAGNOSIS — I509 Heart failure, unspecified: Secondary | ICD-10-CM | POA: Diagnosis not present

## 2014-09-24 ENCOUNTER — Ambulatory Visit: Payer: Medicare Other | Admitting: Emergency Medicine

## 2014-10-12 ENCOUNTER — Ambulatory Visit (INDEPENDENT_AMBULATORY_CARE_PROVIDER_SITE_OTHER): Payer: Medicare Other | Admitting: Emergency Medicine

## 2014-10-12 ENCOUNTER — Encounter: Payer: Self-pay | Admitting: Emergency Medicine

## 2014-10-12 ENCOUNTER — Ambulatory Visit: Payer: Medicare Other | Admitting: Emergency Medicine

## 2014-10-12 VITALS — BP 132/74 | HR 101 | Ht 63.0 in | Wt 190.0 lb

## 2014-10-12 DIAGNOSIS — J449 Chronic obstructive pulmonary disease, unspecified: Secondary | ICD-10-CM | POA: Diagnosis not present

## 2014-10-12 MED ORDER — TIOTROPIUM BROMIDE-OLODATEROL 2.5-2.5 MCG/ACT IN AERS: 2.0000 | INHALATION_SPRAY | Freq: Every day | RESPIRATORY_TRACT | Status: DC

## 2014-10-12 NOTE — Assessment & Plan Note (Signed)
He is worse over the last few months with more dyspnea. He has not taken his Advair or Spiriva for at least a month because he has run out. Prior to that he was using medication that he was able to get from a family member. Cost is an issue. I like to change him to Stiolto to see if he benefits and whether it is more cost effective

## 2014-10-12 NOTE — Patient Instructions (Signed)
We will try replacing Spiriva and Advair with Stiolto 2 puffs once a day.  Please call our office after you have finished the samples to let us know if you've benefited from this medication. If so we will order a through your pharmacy. Please continue your oxygen as you have been using it Follow with Dr Delton CoombesByrum or T Parrett in 1 month.
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
## 2014-10-12 NOTE — Addendum Note (Signed)
Addended by: Meyer CoryAHMAD, MISTY R on: 10/12/2014 12:43 PM   Modules accepted: Orders

## 2014-10-24 ENCOUNTER — Telehealth: Payer: Self-pay | Admitting: Emergency Medicine

## 2014-10-24 NOTE — Telephone Encounter (Signed)
Noted  

## 2014-11-03 ENCOUNTER — Emergency Department (HOSPITAL_COMMUNITY): Payer: Medicare Other

## 2014-11-03 ENCOUNTER — Emergency Department (HOSPITAL_COMMUNITY)
Admission: EM | Admit: 2014-11-03 | Discharge: 2014-11-03 | Disposition: A | Discharge status: Home / Self Care | Payer: Medicare Other | Attending: Emergency Medicine | Admitting: Emergency Medicine

## 2014-11-03 ENCOUNTER — Encounter (HOSPITAL_COMMUNITY): Payer: Self-pay | Admitting: Emergency Medicine

## 2014-11-03 DIAGNOSIS — J441 Chronic obstructive pulmonary disease with (acute) exacerbation: Secondary | ICD-10-CM | POA: Diagnosis not present

## 2014-11-03 DIAGNOSIS — R0602 Shortness of breath: Secondary | ICD-10-CM | POA: Diagnosis present

## 2014-11-03 DIAGNOSIS — Z87891 Personal history of nicotine dependence: Secondary | ICD-10-CM | POA: Diagnosis not present

## 2014-11-03 DIAGNOSIS — Z79899 Other long term (current) drug therapy: Secondary | ICD-10-CM | POA: Insufficient documentation

## 2014-11-03 DIAGNOSIS — I509 Heart failure, unspecified: Secondary | ICD-10-CM | POA: Diagnosis not present

## 2014-11-03 DIAGNOSIS — R2243 Localized swelling, mass and lump, lower limb, bilateral: Secondary | ICD-10-CM | POA: Diagnosis not present

## 2014-11-03 DIAGNOSIS — Z8669 Personal history of other diseases of the nervous system and sense organs: Secondary | ICD-10-CM | POA: Insufficient documentation

## 2014-11-03 DIAGNOSIS — Z794 Long term (current) use of insulin: Secondary | ICD-10-CM | POA: Diagnosis not present

## 2014-11-03 DIAGNOSIS — Z7951 Long term (current) use of inhaled steroids: Secondary | ICD-10-CM | POA: Diagnosis not present

## 2014-11-03 DIAGNOSIS — I10 Essential (primary) hypertension: Secondary | ICD-10-CM | POA: Insufficient documentation

## 2014-11-03 DIAGNOSIS — E119 Type 2 diabetes mellitus without complications: Secondary | ICD-10-CM | POA: Insufficient documentation

## 2014-11-03 LAB — BASIC METABOLIC PANEL
ANION GAP: 7 (ref 5–15)
BUN: 23 mg/dL — ABNORMAL HIGH (ref 6–20)
CALCIUM: 8.3 mg/dL — AB (ref 8.9–10.3)
CO2: 30 mmol/L (ref 22–32)
Chloride: 97 mmol/L — ABNORMAL LOW (ref 101–111)
Creatinine, Ser: 1.33 mg/dL — ABNORMAL HIGH (ref 0.61–1.24)
GFR calc Af Amer: 59 mL/min — ABNORMAL LOW (ref >60)
GFR, EST NON AFRICAN AMERICAN: 51 mL/min — AB (ref >60)
Glucose, Bld: 302 mg/dL — ABNORMAL HIGH (ref 65–99)
Potassium: 4.5 mmol/L (ref 3.5–5.1)
SODIUM: 134 mmol/L — AB (ref 135–145)

## 2014-11-03 LAB — CBC WITH DIFFERENTIAL/PLATELET
Basophils Absolute: 0 10*3/uL (ref 0.0–0.1)
Basophils Relative: 0 % (ref 0–1)
EOS ABS: 0.1 10*3/uL (ref 0.0–0.7)
Eosinophils Relative: 2 % (ref 0–5)
HEMATOCRIT: 43.7 % (ref 39.0–52.0)
Hemoglobin: 13.4 g/dL (ref 13.0–17.0)
LYMPHS ABS: 1.4 10*3/uL (ref 0.7–4.0)
Lymphocytes Relative: 17 % (ref 12–46)
MCH: 26.9 pg (ref 26.0–34.0)
MCHC: 30.7 g/dL (ref 30.0–36.0)
MCV: 87.6 fL (ref 78.0–100.0)
MONOS PCT: 8 % (ref 3–12)
Monocytes Absolute: 0.6 10*3/uL (ref 0.1–1.0)
NEUTROS ABS: 6.1 10*3/uL (ref 1.7–7.7)
Neutrophils Relative %: 73 % (ref 43–77)
Platelets: 172 10*3/uL (ref 150–400)
RBC: 4.99 MIL/uL (ref 4.22–5.81)
RDW: 12.8 % (ref 11.5–15.5)
WBC: 8.4 10*3/uL (ref 4.0–10.5)

## 2014-11-03 LAB — I-STAT TROPONIN, ED: Troponin i, poc: 0.01 ng/mL (ref 0.00–0.08)

## 2014-11-03 LAB — BRAIN NATRIURETIC PEPTIDE: B Natriuretic Peptide: 9.5 pg/mL (ref 0.0–100.0)

## 2014-11-03 MED ORDER — ALBUTEROL SULFATE (2.5 MG/3ML) 0.083% IN NEBU
5.0000 mg | INHALATION_SOLUTION | Freq: Once | RESPIRATORY_TRACT | Status: AC
  Administered 2014-11-03: 5 mg via RESPIRATORY_TRACT
  Filled 2014-11-03: qty 6

## 2014-11-03 MED ORDER — ALBUTEROL SULFATE HFA 108 (90 BASE) MCG/ACT IN AERS
1.0000 | INHALATION_SPRAY | Freq: Four times a day (QID) | RESPIRATORY_TRACT | Status: DC | PRN
  Administered 2014-11-03: 2 via RESPIRATORY_TRACT
  Filled 2014-11-03: qty 6.7

## 2014-11-03 MED ORDER — PREDNISONE 20 MG PO TABS
60.0000 mg | ORAL_TABLET | Freq: Once | ORAL | Status: AC
  Administered 2014-11-03: 60 mg via ORAL
  Filled 2014-11-03: qty 3

## 2014-11-03 MED ORDER — PREDNISONE 20 MG PO TABS: ORAL_TABLET | ORAL | Status: DC
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
## 2014-11-03 NOTE — Discharge Instructions (Signed)
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
## 2014-11-03 NOTE — ED Notes (Signed)
MD at bedside. 

## 2014-11-03 NOTE — ED Notes (Signed)
Patient transported to X-ray 

## 2014-11-03 NOTE — ED Notes (Signed)
Pt ambulated in hallway with no issues. Pt O2 level went to 94% while ambulating.

## 2014-11-20 ENCOUNTER — Ambulatory Visit: Payer: Medicare Other | Admitting: Adult Health

## 2014-11-26 ENCOUNTER — Encounter: Payer: Self-pay | Admitting: Adult Health

## 2014-11-26 ENCOUNTER — Ambulatory Visit (INDEPENDENT_AMBULATORY_CARE_PROVIDER_SITE_OTHER): Payer: Medicare Other | Admitting: Adult Health

## 2014-11-26 VITALS — BP 116/62 | HR 103 | Temp 98.0°F | Ht 64.0 in | Wt 194.0 lb

## 2014-11-26 DIAGNOSIS — J449 Chronic obstructive pulmonary disease, unspecified: Secondary | ICD-10-CM

## 2014-11-26 DIAGNOSIS — J9611 Chronic respiratory failure with hypoxia: Secondary | ICD-10-CM | POA: Diagnosis not present

## 2014-11-26 DIAGNOSIS — J9610 Chronic respiratory failure, unspecified whether with hypoxia or hypercapnia: Secondary | ICD-10-CM | POA: Insufficient documentation

## 2014-11-26 HISTORY — DX: Chronic respiratory failure, unspecified whether with hypoxia or hypercapnia: J96.10

## 2014-11-26 MED ORDER — TIOTROPIUM BROMIDE-OLODATEROL 2.5-2.5 MCG/ACT IN AERS: 2.0000 | INHALATION_SPRAY | Freq: Every day | RESPIRATORY_TRACT | Status: DC

## 2014-11-26 MED ORDER — TIOTROPIUM BROMIDE-OLODATEROL 2.5-2.5 MCG/ACT IN AERS: 2.0000 | INHALATION_SPRAY | Freq: Every day | RESPIRATORY_TRACT | Status: AC

## 2014-11-26 NOTE — Assessment & Plan Note (Signed)
Compensated on Oxygen   Plan    Wear Oxygen 2.5L .  follow up Dr. Delton CoombesByrum in 3 months and As needed

## 2014-11-26 NOTE — Assessment & Plan Note (Signed)
Compensated on present regimen   Plan  Continue on Stiolto  daily  Wear Oxygen 2.5L .  follow up Dr. Delton CoombesByrum in 3 months and As needed

## 2014-11-26 NOTE — Addendum Note (Signed)
Addended by: Karalee HeightOX, Zahrah Sutherlin P on: 11/26/2014 11:35 AM   Modules accepted: Orders
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
## 2014-11-26 NOTE — Patient Instructions (Addendum)
Continue on Stiolto  daily  Wear Oxygen 2.5L .  follow up Dr. Delton CoombesByrum in 3 months and As needed

## 2014-12-11 ENCOUNTER — Ambulatory Visit: Payer: Medicare Other | Admitting: Cardiology

## 2015-01-22 ENCOUNTER — Ambulatory Visit: Payer: Medicare Other | Admitting: Cardiovascular Disease

## 2015-01-23 ENCOUNTER — Emergency Department (HOSPITAL_COMMUNITY): Payer: Medicare Other

## 2015-01-23 ENCOUNTER — Encounter (HOSPITAL_COMMUNITY): Payer: Self-pay | Admitting: Family Medicine

## 2015-01-23 ENCOUNTER — Emergency Department (HOSPITAL_COMMUNITY)
Admission: EM | Admit: 2015-01-23 | Discharge: 2015-01-23 | Disposition: A | Discharge status: Home / Self Care | Payer: Medicare Other | Attending: Emergency Medicine | Admitting: Emergency Medicine

## 2015-01-23 DIAGNOSIS — R109 Unspecified abdominal pain: Secondary | ICD-10-CM | POA: Diagnosis not present

## 2015-01-23 DIAGNOSIS — I509 Heart failure, unspecified: Secondary | ICD-10-CM | POA: Diagnosis not present

## 2015-01-23 DIAGNOSIS — I10 Essential (primary) hypertension: Secondary | ICD-10-CM | POA: Diagnosis not present

## 2015-01-23 DIAGNOSIS — R079 Chest pain, unspecified: Secondary | ICD-10-CM | POA: Diagnosis present

## 2015-01-23 DIAGNOSIS — Z87891 Personal history of nicotine dependence: Secondary | ICD-10-CM | POA: Insufficient documentation

## 2015-01-23 DIAGNOSIS — E119 Type 2 diabetes mellitus without complications: Secondary | ICD-10-CM | POA: Diagnosis not present

## 2015-01-23 DIAGNOSIS — Z79899 Other long term (current) drug therapy: Secondary | ICD-10-CM | POA: Diagnosis not present

## 2015-01-23 DIAGNOSIS — K5900 Constipation, unspecified: Secondary | ICD-10-CM | POA: Insufficient documentation

## 2015-01-23 DIAGNOSIS — R112 Nausea with vomiting, unspecified: Secondary | ICD-10-CM | POA: Diagnosis not present

## 2015-01-23 DIAGNOSIS — J449 Chronic obstructive pulmonary disease, unspecified: Secondary | ICD-10-CM | POA: Insufficient documentation

## 2015-01-23 LAB — CBC
HCT: 42.4 % (ref 39.0–52.0)
HEMOGLOBIN: 13.6 g/dL (ref 13.0–17.0)
MCH: 27.1 pg (ref 26.0–34.0)
MCHC: 32.1 g/dL (ref 30.0–36.0)
MCV: 84.5 fL (ref 78.0–100.0)
PLATELETS: 201 10*3/uL (ref 150–400)
RBC: 5.02 MIL/uL (ref 4.22–5.81)
RDW: 12.9 % (ref 11.5–15.5)
WBC: 7.5 10*3/uL (ref 4.0–10.5)

## 2015-01-23 LAB — BASIC METABOLIC PANEL
ANION GAP: 12 (ref 5–15)
BUN: 18 mg/dL (ref 6–20)
CALCIUM: 9.6 mg/dL (ref 8.9–10.3)
CO2: 25 mmol/L (ref 22–32)
CREATININE: 1.22 mg/dL (ref 0.61–1.24)
Chloride: 94 mmol/L — ABNORMAL LOW (ref 101–111)
GFR, EST NON AFRICAN AMERICAN: 57 mL/min — AB (ref >60)
GLUCOSE: 307 mg/dL — AB (ref 65–99)
Potassium: 3.9 mmol/L (ref 3.5–5.1)
Sodium: 131 mmol/L — ABNORMAL LOW (ref 135–145)

## 2015-01-23 LAB — HEPATIC FUNCTION PANEL
ALT: 15 U/L — ABNORMAL LOW (ref 17–63)
AST: 21 U/L (ref 15–41)
Albumin: 3.6 g/dL (ref 3.5–5.0)
Alkaline Phosphatase: 73 U/L (ref 38–126)
Total Bilirubin: 0.8 mg/dL (ref 0.3–1.2)
Total Protein: 7.2 g/dL (ref 6.5–8.1)

## 2015-01-23 LAB — I-STAT TROPONIN, ED: TROPONIN I, POC: 0.01 ng/mL (ref 0.00–0.08)

## 2015-01-23 LAB — LIPASE, BLOOD: LIPASE: 35 U/L (ref 22–51)

## 2015-01-23 MED ORDER — ONDANSETRON 4 MG PO TBDP: 4.0000 mg | ORAL_TABLET | Freq: Three times a day (TID) | ORAL | Status: DC | PRN

## 2015-01-23 MED ORDER — ONDANSETRON HCL 4 MG/2ML IJ SOLN: 4.0000 mg | Freq: Once | INTRAMUSCULAR | Status: AC

## 2015-01-23 MED ORDER — ONDANSETRON 4 MG PO TBDP
4.0000 mg | ORAL_TABLET | Freq: Once | ORAL | Status: AC
  Administered 2015-01-23: 4 mg via ORAL
  Filled 2015-01-23: qty 1

## 2015-01-23 NOTE — ED Provider Notes (Signed)
CSN: [id number]     Arrival date & time 01/23/15  1831 History   First MD Initiated Contact with Patient 01/23/15 2124     Chief Complaint  Patient presents with  . Chest Pain     (Consider location/radiation/quality/duration/timing/severity/associated sxs/prior Treatment) Patient is a 74 y.o. male presenting with chest pain. The history is provided by the patient.  Chest Pain Associated symptoms: abdominal pain, nausea and vomiting   Associated symptoms: no back pain, no headache, no numbness, no shortness of breath and no weakness    patient presents with chest and upper abdominal pain. States that he began to throw up on Sunday, today is Wednesday. States he thinks he may have eaten some bad food. States he's also had some constipation. States he had one small bowel movement on Sunday but has not had much sense. He usually goes twice a day. States he has some pain in his upper abdomen that radiates into his chest. States it feels like when he has had gas in the past. States abdomen is not distended. No fevers. No cough. No dysuria.  Past Medical History  Diagnosis Date  . HTN (hypertension)   . DM (diabetes mellitus)   . COPD (chronic obstructive pulmonary disease)   . Tobacco abuse   . Hypoxemia   . Bronchitis   . OSA (obstructive sleep apnea)   . Acute respiratory failure   . CHF (congestive heart failure)    Past Surgical History  Procedure Laterality Date  . Colon surgery     Family History  Problem Relation Age of Onset  . Diabetes Mother   . Hypertension Mother   . Peripheral vascular disease Mother     amputation  . Diabetes Father     Right Leg Amputation-Gangrene  . Hypertension Father   . Peripheral vascular disease Father     amputation  . Cancer Father     Lead Poison-Ca  . Pneumonia Father   . Diabetes Sister   . Hypertension Sister   . Diabetes Daughter   . Hypertension Daughter    Social History  Substance Use Topics  . Smoking status: Former  Smoker -- 1.50 packs/day for 50 years    Types: Cigarettes    Quit date: 11/04/2010  . Smokeless tobacco: Never Used  . Alcohol Use: No    Review of Systems  Constitutional: Negative for activity change and appetite change.  Eyes: Negative for pain.  Respiratory: Negative for chest tightness and shortness of breath.   Cardiovascular: Positive for chest pain. Negative for leg swelling.  Gastrointestinal: Positive for nausea, vomiting, abdominal pain and constipation. Negative for diarrhea.  Genitourinary: Negative for flank pain.  Musculoskeletal: Negative for back pain and neck stiffness.  Skin: Negative for rash.  Neurological: Negative for weakness, numbness and headaches.  Psychiatric/Behavioral: Negative for behavioral problems.      Allergies  Review of patient's allergies indicates no known allergies.  Home Medications   Prior to Admission medications   Medication Sig Start Date End Date Taking? Authorizing Provider  amLODipine (NORVASC) 5 MG tablet Take 5 mg by mouth daily.    Historical Provider, MD  furosemide (LASIX) 20 MG tablet Take 1 tablet (20 mg total) by mouth daily. 08/13/14   Bethann Berkshire, MD  Insulin Detemir (LEVEMIR [place]) Inject 10 Units into the skin at bedtime. Gets samples from dr    Historical Provider, MD  lisinopril-hydrochlorothiazide (PRINZIDE,ZESTORETIC) 20-25 MG per tablet Take 1 tablet by mouth daily. 05/08/14   Historical  Provider, MD  loratadine (CLARITIN) 10 MG tablet Take 10 mg by mouth daily.    Historical Provider, MD  metFORMIN (GLUCOPHAGE) 500 MG tablet Take 1,000 mg by mouth 2 (two) times daily with a meal.    Historical Provider, MD  ondansetron (ZOFRAN-ODT) 4 MG disintegrating tablet Take 1 tablet (4 mg total) by mouth every 8 (eight) hours as needed for nausea or vomiting. 01/23/15   Benjiman Core, MD  Tiotropium Bromide-Olodaterol (STIOLTO RESPIMAT) 2.5-2.5 MCG/ACT AERS Inhale 2 puffs into the lungs daily. 11/26/14   Tammy S Parrett, NP    BP 161/81 mmHg  Pulse 94  Temp(Src) 98.4 F (36.9 C) (Oral)  Resp 20  SpO2 97% Physical Exam  Constitutional: He appears well-developed.  HENT:  Head: Atraumatic.  Neck: Normal range of motion.  Cardiovascular: Normal rate.   Pulmonary/Chest: Effort normal.  Abdominal: Soft. He exhibits distension.  Musculoskeletal: He exhibits no tenderness.  Neurological: He is alert.  Skin: Skin is warm.    ED Course  Procedures (including critical care time) Labs Review Labs Reviewed  BASIC METABOLIC PANEL - Abnormal; Notable for the following:    Sodium 131 (*)    Chloride 94 (*)    Glucose, Bld 307 (*)    GFR calc non Af Amer 57 (*)    All other components within normal limits  HEPATIC FUNCTION PANEL - Abnormal; Notable for the following:    ALT 15 (*)    Bilirubin, Direct <0.1 (*)    All other components within normal limits  CBC  LIPASE, BLOOD  I-STAT TROPOININ, ED    Imaging Review Dg Chest 2 View  01/23/2015   CLINICAL DATA:  Mid left chest pain with tingling in the left arm. History of COPD.  EXAM: CHEST  2 VIEW  COMPARISON:  PA and lateral chest 11/03/2014 and 11/10/2010.  FINDINGS: Mild linear atelectasis or scar is seen in the left mid lung. The lungs are otherwise clear. Heart size is normal. No pneumothorax or pleural effusion. No focal bony abnormality.  IMPRESSION: No acute disease.   Electronically Signed   By: Drusilla Kanner M.D.   On: 01/23/2015 20:59   Dg Abd 2 Views  01/23/2015   CLINICAL DATA:  Epigastric pain for 3 days. Lots of gas and burping.  EXAM: ABDOMEN - 2 VIEW  COMPARISON:  06/18/2010  FINDINGS: Scattered gas and stool in the colon. No small or large bowel distention. No free intra-abdominal air. No abnormal air-fluid levels. No radiopaque stones. Visualized bones appear intact. Degenerative changes in the lumbar spine and hips.  IMPRESSION: Nonobstructive bowel gas pattern.   Electronically Signed   By: Burman Nieves M.D.   On: 01/23/2015 22:42    I have personally reviewed and evaluated these images and lab results as part of my medical decision-making.   EKG Interpretation   Date/Time:  Wednesday January 23 2015 18:36:44 EDT Ventricular Rate:  95 PR Interval:  182 QRS Duration: 88 QT Interval:  346 QTC Calculation: 434 R Axis:   44 Text Interpretation:  Normal sinus rhythm with sinus arrhythmia Normal ECG  Confirmed by Ceylin Dreibelbis  MD, Harrold Donath [phone]) on 01/23/2015 9:26:53 PM      MDM   Final diagnoses:  Abdominal pain  Non-intractable vomiting with nausea, vomiting of unspecified type    Patient with abdominal pain nausea and vomiting. Still passing gas. Does not have obstruction on x-ray. EKG reassuring. Lab work also reassuring. Has tolerated some fluids here. Rather benign abdominal exam minus  some mild distention. Will discharge home. Patient feels somewhat better after Zofran.    Benjiman Core, MD 01/23/15 [phone]

## 2015-01-23 NOTE — ED Notes (Signed)
Pt. Given 240cc of Coke for PO challenge

## 2015-01-23 NOTE — Discharge Instructions (Signed)
Abdominal Pain °Many things can cause abdominal pain. Usually, abdominal pain is not caused by a disease and will improve without treatment. It can often be observed and treated at home. Your health care provider will do a physical exam and possibly order blood tests and X-rays to help determine the seriousness of your pain. However, in many cases, more time must pass before a clear cause of the pain can be found. Before that point, your health care provider may not know if you need more testing or further treatment. °HOME CARE INSTRUCTIONS  °Monitor your abdominal pain for any changes. The following actions may help to alleviate any discomfort you are experiencing: °· Only take over-the-counter or prescription medicines as directed by your health care provider. °· Do not take laxatives unless directed to do so by your health care provider. °· Try a clear liquid diet (broth, tea, or water) as directed by your health care provider. Slowly move to a bland diet as tolerated. °SEEK MEDICAL CARE IF: °· You have unexplained abdominal pain. °· You have abdominal pain associated with nausea or diarrhea. °· You have pain when you urinate or have a bowel movement. °· You experience abdominal pain that wakes you in the night. °· You have abdominal pain that is worsened or improved by eating food. °· You have abdominal pain that is worsened with eating fatty foods. °· You have a fever. °SEEK IMMEDIATE MEDICAL CARE IF:  °· Your pain does not go away within 2 hours. °· You keep throwing up (vomiting). °· Your pain is felt only in portions of the abdomen, such as the right side or the left lower portion of the abdomen. °· You pass bloody or black tarry stools. °MAKE SURE YOU: °· Understand these instructions.   °· Will watch your condition.   °· Will get help right away if you are not doing well or get worse.   °Document Released: 02/11/2005 Document Revised: 05/09/2013 Document Reviewed: 01/11/2013 °ExitCare® Patient Information  ©2015 [employer]. This information is not intended to replace advice given to you by your health care provider. Make sure you discuss any questions you have with your health care provider. ° °Nausea and Vomiting °Nausea is a sick feeling that often comes before throwing up (vomiting). Vomiting is a reflex where stomach contents come out of your mouth. Vomiting can cause severe loss of body fluids (dehydration). Children and elderly adults can become dehydrated quickly, especially if they also have diarrhea. Nausea and vomiting are symptoms of a condition or disease. It is important to find the cause of your symptoms. °CAUSES  °· Direct irritation of the stomach lining. This irritation can result from increased acid production (gastroesophageal reflux disease), infection, food poisoning, taking certain medicines (such as nonsteroidal anti-inflammatory drugs), alcohol use, or tobacco use. °· Signals from the brain. These signals could be caused by a headache, heat exposure, an inner ear disturbance, increased pressure in the brain from injury, infection, a tumor, or a concussion, pain, emotional stimulus, or metabolic problems. °· An obstruction in the gastrointestinal tract (bowel obstruction). °· Illnesses such as diabetes, hepatitis, gallbladder problems, appendicitis, kidney problems, cancer, sepsis, atypical symptoms of a heart attack, or eating disorders. °· Medical treatments such as chemotherapy and radiation. °· Receiving medicine that makes you sleep (general anesthetic) during surgery. °DIAGNOSIS °Your caregiver may ask for tests to be done if the problems do not improve after a few days. Tests may also be done if symptoms are severe or if the reason for the nausea   and vomiting is not clear. Tests may include: °· Urine tests. °· Blood tests. °· Stool tests. °· Cultures (to look for evidence of infection). °· X-rays or other imaging studies. °Test results can help your caregiver make decisions about  treatment or the need for additional tests. °TREATMENT °You need to stay well hydrated. Drink frequently but in small amounts. You may wish to drink water, sports drinks, clear broth, or eat frozen ice pops or gelatin dessert to help stay hydrated. When you eat, eating slowly may help prevent nausea. There are also some antinausea medicines that may help prevent nausea. °HOME CARE INSTRUCTIONS  °· Take all medicine as directed by your caregiver. °· If you do not have an appetite, do not force yourself to eat. However, you must continue to drink fluids. °· If you have an appetite, eat a normal diet unless your caregiver tells you differently. °¨ Eat a variety of complex carbohydrates (rice, wheat, potatoes, bread), lean meats, yogurt, fruits, and vegetables. °¨ Avoid high-fat foods because they are more difficult to digest. °· Drink enough water and fluids to keep your urine clear or pale yellow. °· If you are dehydrated, ask your caregiver for specific rehydration instructions. Signs of dehydration may include: °¨ Severe thirst. °¨ Dry lips and mouth. °¨ Dizziness. °¨ Dark urine. °¨ Decreasing urine frequency and amount. °¨ Confusion. °¨ Rapid breathing or pulse. °SEEK IMMEDIATE MEDICAL CARE IF:  °· You have blood or brown flecks (like coffee grounds) in your vomit. °· You have black or bloody stools. °· You have a severe headache or stiff neck. °· You are confused. °· You have severe abdominal pain. °· You have chest pain or trouble breathing. °· You do not urinate at least once every 8 hours. °· You develop cold or clammy skin. °· You continue to vomit for longer than 24 to 48 hours. °· You have a fever. °MAKE SURE YOU:  °· Understand these instructions. °· Will watch your condition. °· Will get help right away if you are not doing well or get worse. °Document Released: 05/04/2005 Document Revised: 07/27/2011 Document Reviewed: 10/01/2010 °ExitCare® Patient Information ©2015 [employer]. This information is not  intended to replace advice given to you by your health care provider. Make sure you discuss any questions you have with your health care provider. ° °

## 2015-01-23 NOTE — ED Notes (Signed)
Pt. Left with all belongings. Discharge instructions were reviewed and all questions were answered.  

## 2015-01-23 NOTE — ED Notes (Signed)
Pt transported to xray 

## 2015-01-23 NOTE — ED Notes (Addendum)
Pt here for chest pain that started this afternoon with some SOB. Pt sts also a lot of gas and abd pain.

## 2015-03-06 ENCOUNTER — Encounter: Payer: Self-pay | Admitting: Cardiovascular Disease

## 2015-03-06 ENCOUNTER — Ambulatory Visit (INDEPENDENT_AMBULATORY_CARE_PROVIDER_SITE_OTHER): Payer: Medicare Other | Admitting: Cardiovascular Disease

## 2015-03-06 VITALS — BP 104/66 | HR 109 | Ht 66.0 in | Wt 192.0 lb

## 2015-03-06 DIAGNOSIS — R600 Localized edema: Secondary | ICD-10-CM

## 2015-03-06 DIAGNOSIS — I10 Essential (primary) hypertension: Secondary | ICD-10-CM | POA: Diagnosis not present

## 2015-03-06 HISTORY — DX: Essential (primary) hypertension: I10

## 2015-03-06 NOTE — Patient Instructions (Signed)
Medication Instructions:  Your physician has recommended you make the following change in your medication:  1) STOP Norvasc   Labwork: none  Testing/Procedures: none  Follow-Up: Follow up with Dr. Allyson SabalBerry as needed.   Any Other Special Instructions Will Be Listed Below (If Applicable).

## 2015-03-06 NOTE — Assessment & Plan Note (Signed)
History of hypertension blood pressure measured at 104/66. He is on amlodipine, lisinopril and hydrochlorothiazide as well as Lasix. I've told him to stop his amlodipine as this may contribute to lower extremity edema.

## 2015-03-06 NOTE — Assessment & Plan Note (Signed)
[name] was referred to me by Dr. Cliffton AstersWhite for evaluation of lower extremity edema right greater than left. He has had this for many years. He apparently had a right lower extremity ulcer which was treated at the wound care center. He had Dopplers performed at [facility] which showed a focal lesion in the right SFA. Venous Doppler showed no evidence of DVT with some mild reflux on the left side.he is worse all restriction, leg elevation and compression stockings. He is on diuretics. He does have 2+ pitting edema on the right greater than left. I have no further recommendations at this time. I did tell to stop his amlodipine which may exacerbate edema.

## 2015-03-06 NOTE — Progress Notes (Signed)
03/06/2015 Ivan Parks   Feb 19, 1941  [id number]  Primary Physician Cala Bradford, MD Primary Cardiologist: Runell Gess MD Ivan Parks   HPI:  Mr. Doughten is a 74 year old moderately overweight married African-American male father of 8 children 7 of whom are children his accompanied by his daughter Ivan Parks today. He was referred by Dr. Leone Brand for peripheral vascular evaluation because of lower extremity edema right greater than left. His cardiovascular risk factor profile is notable for 50-pack-years of tobacco abuse having stopped 7 years ago. History of hypertension and diabetes. Does have COPD on home O2. He has never had a heart attack or stroke. He did have trauma to his right leg and developed a superficial ulcer which was treated at the wound care center. He was seen by [facility] were arterial venous Dopplers were performed. hhe did have a focal lesion in his mid right SFA but denies claudication. Venous Doppler showed no evidence of DVT but he did have reflux on the left side. He is on amlodipine for hypertension and is aware of solid restriction, leg elevation and does have compression stockings at home.   Current Outpatient Prescriptions  Medication Sig Dispense Refill  . furosemide (LASIX) 20 MG tablet Take 1 tablet (20 mg total) by mouth daily. 30 tablet 0  . Insulin Detemir (LEVEMIR [place]) Inject 10 Units into the skin at bedtime. Gets samples from dr    . lisinopril-hydrochlorothiazide (PRINZIDE,ZESTORETIC) 20-25 MG per tablet Take 1 tablet by mouth daily.  1  . loratadine (CLARITIN) 10 MG tablet Take 10 mg by mouth daily.    . metFORMIN (GLUCOPHAGE) 500 MG tablet Take 1,000 mg by mouth 2 (two) times daily with a meal.    . ondansetron (ZOFRAN-ODT) 4 MG disintegrating tablet Take 1 tablet (4 mg total) by mouth every 8 (eight) hours as needed for nausea or vomiting. 8 tablet 0  . Tiotropium Bromide-Olodaterol (STIOLTO RESPIMAT) 2.5-2.5 MCG/ACT AERS Inhale 2 puffs into  the lungs daily. 4 g 5   No current facility-administered medications for this visit.    No Known Allergies  Social History   Social History  . Marital Status: Married    Spouse Name: N/A  . Number of Children: 7  . Years of Education: N/A   Occupational History  . retired    Social History Main Topics  . Smoking status: Former Smoker -- 1.50 packs/day for 50 years    Types: Cigarettes    Quit date: 11/04/2010  . Smokeless tobacco: Never Used  . Alcohol Use: No  . Drug Use: No  . Sexual Activity: Not on file   Other Topics Concern  . Not on file   Social History Narrative     Review of Systems: General: negative for chills, fever, night sweats or weight changes.  Cardiovascular: negative for chest pain, dyspnea on exertion, edema, orthopnea, palpitations, paroxysmal nocturnal dyspnea or shortness of breath Dermatological: negative for rash Respiratory: negative for cough or wheezing Urologic: negative for hematuria Abdominal: negative for nausea, vomiting, diarrhea, bright red blood per rectum, melena, or hematemesis Neurologic: negative for visual changes, syncope, or dizziness All other systems reviewed and are otherwise negative except as noted above.    Blood pressure 104/66, pulse 109, height  (1.676 m), weight 192 lb (87.091 kg).  General appearance: alert and no distress Neck: no adenopathy, no carotid bruit, no JVD, supple, symmetrical, trachea midline and thyroid not enlarged, symmetric, no tenderness/mass/nodules Lungs: clear to auscultation bilaterally Heart: regular  rate and rhythm, S1, S2 normal, no murmur, click, rub or gallop Extremities: 2+ pitting edema right greater than left  EKG not performed today  ASSESSMENT AND PLAN:   Leg edema, right- Greater than Left leg Mr. Anselm Lisnoch was referred to me by Dr. Cliffton AstersWhite for evaluation of lower extremity edema right greater than left. He has had this for many years. He apparently had a right lower  extremity ulcer which was treated at the wound care center. He had Dopplers performed at [facility] which showed a focal lesion in the right SFA. Venous Doppler showed no evidence of DVT with some mild reflux on the left side.he is worse all restriction, leg elevation and compression stockings. He is on diuretics. He does have 2+ pitting edema on the right greater than left. I have no further recommendations at this time. I did tell to stop his amlodipine which may exacerbate edema.  Essential hypertension History of hypertension blood pressure measured at 104/66. He is on amlodipine, lisinopril and hydrochlorothiazide as well as Lasix. I've told him to stop his amlodipine as this may contribute to lower extremity edema.      Runell GessJonathan J. Rilynn Habel MD FACP,FACC,FAHA, [facility] 03/06/2015 2:51 PM

## 2015-06-27 ENCOUNTER — Encounter: Payer: Self-pay | Admitting: Family

## 2015-07-04 ENCOUNTER — Encounter (HOSPITAL_COMMUNITY): Payer: Medicare Other

## 2015-07-04 ENCOUNTER — Ambulatory Visit: Payer: Medicare Other | Admitting: Family

## 2015-08-08 ENCOUNTER — Encounter: Payer: Self-pay | Admitting: Family

## 2015-08-16 ENCOUNTER — Ambulatory Visit: Payer: Medicare Other | Admitting: Family

## 2015-08-16 ENCOUNTER — Encounter (HOSPITAL_COMMUNITY): Payer: Medicare Other

## 2015-10-09 ENCOUNTER — Encounter: Payer: Self-pay | Admitting: Family

## 2015-10-16 ENCOUNTER — Encounter: Payer: Self-pay | Admitting: Family

## 2015-10-16 ENCOUNTER — Ambulatory Visit (HOSPITAL_COMMUNITY)
Admission: RE | Admit: 2015-10-16 | Discharge: 2015-10-16 | Disposition: A | Discharge status: Home / Self Care | Payer: Medicare Other | Source: Ambulatory Visit | Attending: Family | Admitting: Family

## 2015-10-16 ENCOUNTER — Ambulatory Visit (INDEPENDENT_AMBULATORY_CARE_PROVIDER_SITE_OTHER): Payer: Medicare Other | Admitting: Family

## 2015-10-16 VITALS — BP 124/79 | HR 108 | Temp 97.0°F | Resp 16 | Ht 64.0 in | Wt 197.0 lb

## 2015-10-16 DIAGNOSIS — R600 Localized edema: Secondary | ICD-10-CM

## 2015-10-16 DIAGNOSIS — I872 Venous insufficiency (chronic) (peripheral): Secondary | ICD-10-CM

## 2015-10-16 DIAGNOSIS — Z87891 Personal history of nicotine dependence: Secondary | ICD-10-CM

## 2015-10-16 DIAGNOSIS — IMO0002 Reserved for concepts with insufficient information to code with codable children: Secondary | ICD-10-CM

## 2015-10-16 DIAGNOSIS — I739 Peripheral vascular disease, unspecified: Secondary | ICD-10-CM

## 2015-10-16 DIAGNOSIS — I110 Hypertensive heart disease with heart failure: Secondary | ICD-10-CM | POA: Diagnosis not present

## 2015-10-16 DIAGNOSIS — I509 Heart failure, unspecified: Secondary | ICD-10-CM | POA: Diagnosis not present

## 2015-10-16 DIAGNOSIS — E119 Type 2 diabetes mellitus without complications: Secondary | ICD-10-CM | POA: Insufficient documentation

## 2015-10-16 DIAGNOSIS — Z720 Tobacco use: Secondary | ICD-10-CM | POA: Diagnosis not present

## 2015-10-16 DIAGNOSIS — E1165 Type 2 diabetes mellitus with hyperglycemia: Secondary | ICD-10-CM

## 2015-10-16 DIAGNOSIS — G4733 Obstructive sleep apnea (adult) (pediatric): Secondary | ICD-10-CM | POA: Insufficient documentation

## 2015-10-16 DIAGNOSIS — E1151 Type 2 diabetes mellitus with diabetic peripheral angiopathy without gangrene: Secondary | ICD-10-CM | POA: Diagnosis not present

## 2015-10-16 NOTE — Progress Notes (Signed)
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 male patient of Dr. Darrick Penna who presents today stating that he was referred here for swelling in his right leg, returns for scheduled follow up. He states that he has had swelling in his right leg since he was a kid. He states that this does not give him any problems. He denies any claudication symptoms, but he does not seem to walk much, limited by right knee pain and possibly dyspnea. States that he was told he has some broken bones in his right foot and had a right knee injury in his younger years. He does have a hx of tobacco use, but quit in 2012.  He does have COPD and wears continuous oxygen. He is on a statin for his cholesterol. He does take Metformin for his diabetes. He is on an ACEI for his HTN.  He was seen by Dr. Wiliam Ke for wound care of his right anterior lower leg, treated by his daughter, wounds healed, 2 visits to wound care and discharged as his wounds were healed. He has chronic venous insufficiency in his right lower leg, by history he does not seem to elevate his legs much, sleeps sitting up with his legs dependent.   Pt Diabetic: Yes, pt states uncontrolled Pt smoker: former smoker, quit in 2012  Pt meds include: Statin :Yes ASA: No, states he has no allergy to ASA, denies bleeding problems, denies GI ulcers or bleeding Other anticoagulants/antiplatelets: no   Past Medical History  Diagnosis Date  . HTN (hypertension)   . DM (diabetes mellitus) (HCC)   . COPD (chronic obstructive pulmonary disease) (HCC)   . Tobacco abuse   . Hypoxemia   . Bronchitis   . OSA (obstructive sleep apnea)   . Acute respiratory failure (HCC)   . CHF (congestive heart failure) (HCC)   . Bilateral lower extremity edema   . Peripheral vascular disease [facility])     Social History Social History  Substance Use Topics  . Smoking  status: Former Smoker -- 1.50 packs/day for 50 years    Types: Cigarettes    Quit date: 11/04/2010  . Smokeless tobacco: Never Used  . Alcohol Use: No    Family History Family History  Problem Relation Age of Onset  . Diabetes Mother   . Hypertension Mother   . Peripheral vascular disease Mother     amputation  . Diabetes Father     Right Leg Amputation-Gangrene  . Hypertension Father   . Peripheral vascular disease Father     amputation  . Cancer Father     Lead Poison-Ca  . Pneumonia Father   . Diabetes Sister   . Hypertension Sister   . Diabetes Daughter   . Hypertension Daughter     Past Surgical History  Procedure Laterality Date  . Colon surgery      No Known Allergies  Current Outpatient Prescriptions  Medication Sig Dispense Refill  . furosemide (LASIX) 20 MG tablet Take 1 tablet (20 mg total) by mouth daily. 30 tablet 0  . Insulin Detemir (LEVEMIR [place]) Inject 10 Units into the skin at bedtime. Gets samples from dr    . lisinopril-hydrochlorothiazide (PRINZIDE,ZESTORETIC) 20-25 MG per tablet Take 1 tablet by mouth daily.  1  . loratadine (CLARITIN) 10 MG tablet Take 10 mg by mouth daily.    . metFORMIN (GLUCOPHAGE) 500 MG tablet Take 1,000  mg by mouth 2 (two) times daily with a meal.    . ondansetron (ZOFRAN-ODT) 4 MG disintegrating tablet Take 1 tablet (4 mg total) by mouth every 8 (eight) hours as needed for nausea or vomiting. 8 tablet 0  . Tiotropium Bromide-Olodaterol (STIOLTO RESPIMAT) 2.5-2.5 MCG/ACT AERS Inhale 2 puffs into the lungs daily. 4 g 5   No current facility-administered medications for this visit.    ROS: See HPI for pertinent positives and negatives.   Physical Examination  Filed Vitals:   10/16/15 1549  BP: 124/79  Pulse: 108  Temp: 97 F (36.1 C)  Resp: 16  Height: 5\' 4"  (1.626 m)  Weight: 197 lb (89.359 kg)  SpO2: 88%   Body mass index is 33.8 kg/(m^2).  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  Eyes: Pupils equal Pulmonary: normal non-labored breathing, limited air movement in all fields, no rales, rhonchi,or wheezing; wearing nasal canula for supplemental O2. Cardiac: RRR, with no appreciable murmur; without carotid bruits Abdomen: soft, NT, no palpable masses, large panus Skin: without rashes, without ulcers, see Extremities.  Vascular Exam/Pulses:  Right Left  Femoral Not palpable (obese) Not palpable (obese)  DP Non palpable, biphasic by Doppler Non palpable, biphasic by Doppler  PT Non palpable, biphasic by Doppler Not palpable, mbiphasic by Doppler   Extremities: without ischemic changes, without Gangrene , without cellulitis; without open wounds; 2+ pitting and non pitting edema RLE, 1+ in left LE. Leathery skin of both lower legs consistent with chronic venous insufficiency.  Musculoskeletal: no muscle wasting or atrophy Neurologic: A&O X 3; Appropriate Affect ; SENSATION: normal; MOTOR FUNCTION: moving all extremities equally. Speech is fluent/normal, some hearing loss.               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 male who presents with: -Mild PAD,pulses are not palpable, are Dopplerable. His walking is limited by right knee pain, see Plan.  -Chronic venous insufficiency of lower legs, see Plan. -Uncontrolled DM is his major atherosclerotic risk factor. Other atherosclerotic risk factors for him are former smoker, COPD, obesity, and sedentary lifestyle.    PLAN:  Based on the patient's vascular studies and examination, pt will return to clinic in 1 year with ABI's.   PAOD - Daily seated leg exercises discussed and demonstrated   Chronic venous insufficiency  with history of venous stasis ulcers - adequate elevation of legs demonstrated to pt and niece: do this 20 minutes, 3-4 x/day and overnight.       Wear knee high compression hose during the day.  I discussed in depth with the patient the nature of atherosclerosis, and emphasized the importance of maximal medical management including strict control of blood pressure, blood glucose, and lipid levels, obtaining regular exercise, and continued cessation of smoking.  The patient is aware that without maximal medical management the underlying atherosclerotic disease process will progress, limiting the benefit of any interventions.  The patient was given information about PAD including signs, symptoms, treatment, what symptoms should prompt the patient to seek immediate medical care, and risk reduction measures to take.  Charisse MarchSuzanne Alonnah Lampkins, RN, MSN, FNP-C [facility] [place] Office Phone: [phone]  Clinic MD: Early  10/16/2015 3:56 PM

## 2015-10-16 NOTE — Patient Instructions (Addendum)
Peripheral Vascular Disease  Peripheral vascular disease (PVD) is a disease of the blood vessels that are not part of your heart and brain. A simple term for PVD is poor circulation. In most cases, PVD narrows the blood vessels that carry blood from your heart to the rest of your body. This can result in a decreased supply of blood to your arms, legs, and internal organs, like your stomach or kidneys. However, it most often affects a person's lower legs and feet.  There are two types of PVD.  · Organic PVD. This is the more common type. It is caused by damage to the structure of blood vessels.  · Functional PVD. This is caused by conditions that make blood vessels contract and tighten (spasm).  Without treatment, PVD tends to get worse over time.  PVD can also lead to acute ischemic limb. This is when an arm or limb suddenly has trouble getting enough blood. This is a medical emergency.  CAUSES  Each type of PVD has many different causes. The most common cause of PVD is buildup of a fatty material (plaque) inside of your arteries (atherosclerosis). Small amounts of plaque can break off from the walls of the blood vessels and become lodged in a smaller artery. This blocks blood flow and can cause acute ischemic limb.  Other common causes of PVD include:  · Blood clots that form inside of blood vessels.  · Injuries to blood vessels.  · Diseases that cause inflammation of blood vessels or cause blood vessel spasms.  · Health behaviors and health history that increase your risk of developing PVD.  RISK FACTORS   You may have a greater risk of PVD if you:  · Have a family history of PVD.  · Have certain medical conditions, including:    High cholesterol.    Diabetes.    High blood pressure (hypertension).    Coronary heart disease.    Past problems with blood clots.    Past injury, such as burns or a broken bone. These may have damaged blood vessels in your limbs.    Buerger disease. This is caused by inflamed blood  vessels in your hands and feet.    Some forms of arthritis.    Rare birth defects that affect the arteries in your legs.  · Use tobacco.  · Do not get enough exercise.  · Are obese.  · Are age 50 or older.  SIGNS AND SYMPTOMS   PVD may cause many different symptoms. Your symptoms depend on what part of your body is not getting enough blood. Some common signs and symptoms include:  · Cramps in your lower legs. This may be a symptom of poor leg circulation (claudication).  · Pain and weakness in your legs while you are physically active that goes away when you rest (intermittent claudication).  · Leg pain when at rest.  · Leg numbness, tingling, or weakness.  · Coldness in a leg or foot, especially when compared with the other leg.  · Skin or hair changes. These can include:    Hair loss.    Shiny skin.    Pale or bluish skin.    Thick toenails.  · Inability to get or maintain an erection (erectile dysfunction).  People with PVD are more prone to developing ulcers and sores on their toes, feet, or legs. These may take longer than normal to heal.  DIAGNOSIS  Your health care provider may diagnose PVD from your signs and symptoms.   The health care provider will also do a physical exam. You may have tests to find out what is causing your PVD and determine its severity. Tests may include:  · Blood pressure recordings from your arms and legs and measurements of the strength of your pulses (pulse volume recordings).  · Imaging studies using sound waves to take pictures of the blood flow through your blood vessels (Doppler ultrasound).  · Injecting a dye into your blood vessels before having imaging studies using:    X-rays (angiogram or arteriogram).    Computer-generated X-rays (CT angiogram).    A powerful electromagnetic field and a computer (magnetic resonance angiogram or MRA).  TREATMENT  Treatment for PVD depends on the cause of your condition and the severity of your symptoms. It also depends on your age. Underlying  causes need to be treated and controlled. These include long-lasting (chronic) conditions, such as diabetes, high cholesterol, and high blood pressure. You may need to first try making lifestyle changes and taking medicines. Surgery may be needed if these do not work.  Lifestyle changes may include:  · Quitting smoking.  · Exercising regularly.  · Following a low-fat, low-cholesterol diet.  Medicines may include:  · Blood thinners to prevent blood clots.  · Medicines to improve blood flow.  · Medicines to improve your blood cholesterol levels.  Surgical procedures may include:  · A procedure that uses an inflated balloon to open a blocked artery and improve blood flow (angioplasty).  · A procedure to put in a tube (stent) to keep a blocked artery open (stent implant).  · Surgery to reroute blood flow around a blocked artery (peripheral bypass surgery).  · Surgery to remove dead tissue from an infected wound on the affected limb.  · Amputation. This is surgical removal of the affected limb. This may be necessary in cases of acute ischemic limb that are not improved through medical or surgical treatments.  HOME CARE INSTRUCTIONS  · Take medicines only as directed by your health care provider.  · Do not use any tobacco products, including cigarettes, chewing tobacco, or electronic cigarettes.  If you need help quitting, ask your health care provider.  · Lose weight if you are overweight, and maintain a healthy weight as directed by your health care provider.  · Eat a diet that is low in fat and cholesterol. If you need help, ask your health care provider.  · Exercise regularly. Ask your health care provider to suggest some good activities for you.  · Use compression stockings or other mechanical devices as directed by your health care provider.  · Take good care of your feet.    Wear comfortable shoes that fit well.    Check your feet often for any cuts or sores.  SEEK MEDICAL CARE IF:  · You have cramps in your legs  while walking.  · You have leg pain when you are at rest.  · You have coldness in a leg or foot.  · Your skin changes.  · You have erectile dysfunction.  · You have cuts or sores on your feet that are not healing.  SEEK IMMEDIATE MEDICAL CARE IF:  · Your arm or leg turns cold and blue.  · Your arms or legs become red, warm, swollen, painful, or numb.  · You have chest pain or trouble breathing.  · You suddenly have weakness in your face, arm, or leg.  · You become very confused or lose the ability to speak.  ·   You suddenly have a very bad headache or lose your vision.     This information is not intended to replace advice given to you by your health care provider. Make sure you discuss any questions you have with your health care provider.     Document Released: 06/11/2004 Document Revised: 05/25/2014 Document Reviewed: 10/12/2013  Elsevier Interactive Patient Education ©2016 [employer] Inc[employer]    Venous Stasis or Chronic Venous Insufficiency  Chronic venous insufficiency, also called venous stasis, is a condition that affects the veins in the legs. The condition prevents blood from being pumped through these veins effectively. Blood may no longer be pumped effectively from the legs back to the heart. This condition can range from mild to severe. With proper treatment, you should be able to continue with an active life.  CAUSES   Chronic venous insufficiency occurs when the vein walls become stretched, weakened, or damaged or when valves within the vein are damaged. Some common causes of this include:  · High blood pressure inside the veins (venous hypertension).  · Increased blood pressure in the leg veins from long periods of sitting or standing.  · A blood clot that blocks blood flow in a vein (deep vein thrombosis).  · Inflammation of a superficial vein (phlebitis) that causes a blood clot to form.  RISK FACTORS  Various things can make you more likely to develop chronic venous insufficiency, including:  · Family  history of this condition.  · Obesity.  · Pregnancy.  · Sedentary lifestyle.  · Smoking.  · Jobs requiring long periods of standing or sitting in one place.  · Being a certain age. Women in their 40s and 50s and men in their 70s are more likely to develop this condition.  SIGNS AND SYMPTOMS   Symptoms may include:   · Varicose veins.  · Skin breakdown or ulcers.  · Reddened or discolored skin on the leg.  · Brown, smooth, tight, and painful skin just above the ankle, usually on the inside surface (lipodermatosclerosis).  · Swelling.  DIAGNOSIS   To diagnose this condition, your health care provider will take a medical history and do a physical exam. The following tests may be ordered to confirm the diagnosis:  · Duplex ultrasound--A procedure that produces a picture of a blood vessel and nearby organs and also provides information on blood flow through the blood vessel.  · Plethysmography--A procedure that tests blood flow.  · A venogram, or venography--A procedure used to look at the veins using X-ray and dye.  TREATMENT  The goals of treatment are to help you return to an active life and to minimize pain or disability. Treatment will depend on the severity of the condition. Medical procedures may be needed for severe cases. Treatment options may include:   · Use of compression stockings. These can help with symptoms and lower the chances of the problem getting worse, but they do not cure the problem.  · Sclerotherapy--A procedure involving an injection of a material that "dissolves" the damaged veins. Other veins in the network of blood vessels take over the function of the damaged veins.  · Surgery to remove the vein or cut off blood flow through the vein (vein stripping or laser ablation surgery).  · Surgery to repair a valve.  HOME CARE INSTRUCTIONS   · Wear compression stockings as directed by your health care provider.  · Only take over-the-counter or prescription medicines for pain, discomfort, or fever as  directed by your health   care provider.  · Follow up with your health care provider as directed.  SEEK MEDICAL CARE IF:   · You have redness, swelling, or increasing pain in the affected area.  · You see a red streak or line that extends up or down from the affected area.  · You have a breakdown or loss of skin in the affected area, even if the breakdown is small.  · You have an injury to the affected area.  SEEK IMMEDIATE MEDICAL CARE IF:   · You have an injury and open wound in the affected area.  · Your pain is severe and does not improve with medicine.  · You have sudden numbness or weakness in the foot or ankle below the affected area, or you have trouble moving your foot or ankle.  · You have a fever or persistent symptoms for more than 2-3 days.  · You have a fever and your symptoms suddenly get worse.  MAKE SURE YOU:   · Understand these instructions.  · Will watch your condition.  · Will get help right away if you are not doing well or get worse.     This information is not intended to replace advice given to you by your health care provider. Make sure you discuss any questions you have with your health care provider.     Document Released: 09/07/2006 Document Revised: 02/22/2013 Document Reviewed: 01/09/2013  Elsevier Interactive Patient Education ©2016 Elsevier Inc.

## 2015-12-09 ENCOUNTER — Other Ambulatory Visit: Payer: Self-pay | Admitting: Family

## 2015-12-09 DIAGNOSIS — I739 Peripheral vascular disease, unspecified: Secondary | ICD-10-CM

## 2016-01-14 ENCOUNTER — Ambulatory Visit (INDEPENDENT_AMBULATORY_CARE_PROVIDER_SITE_OTHER): Payer: Medicare Other | Admitting: Adult Health

## 2016-01-14 ENCOUNTER — Ambulatory Visit (INDEPENDENT_AMBULATORY_CARE_PROVIDER_SITE_OTHER)
Admission: RE | Admit: 2016-01-14 | Discharge: 2016-01-14 | Disposition: A | Discharge status: Home / Self Care | Payer: Medicare Other | Source: Ambulatory Visit | Attending: Adult Health | Admitting: Adult Health

## 2016-01-14 ENCOUNTER — Encounter: Payer: Self-pay | Admitting: Adult Health

## 2016-01-14 ENCOUNTER — Telehealth: Payer: Self-pay | Admitting: Emergency Medicine

## 2016-01-14 DIAGNOSIS — J9611 Chronic respiratory failure with hypoxia: Secondary | ICD-10-CM

## 2016-01-14 DIAGNOSIS — J449 Chronic obstructive pulmonary disease, unspecified: Secondary | ICD-10-CM

## 2016-01-14 MED ORDER — TIOTROPIUM BROMIDE-OLODATEROL 2.5-2.5 MCG/ACT IN AERS: 2.0000 | INHALATION_SPRAY | Freq: Every day | RESPIRATORY_TRACT | 5 refills | Status: DC

## 2016-01-14 NOTE — Addendum Note (Signed)
Addended by: Karalee HeightOX, Mayu Ronk P on: 01/14/2016 11:15 AM   Modules accepted: Orders

## 2016-01-14 NOTE — Telephone Encounter (Signed)
Spoke with pt's wife.  She states that pharmacy will have to order Stiolto and should pt use his daughter's Symbicort until this comes in.  I spoke with pharmacy and Stiolto will be available tomorrow and they will call when it comes in.  Spoke with pt's wife and advised that they will be able to pick up [employer] tomorrow.

## 2016-01-14 NOTE — Patient Instructions (Addendum)
Restart Stiolto 2 puffs daily  Chest xray and labs today .  Continue on Oxygen 2.5l/m  follow up Dr. Delton CoombesByrum in 3 months and As needed   Discuss with your primary doctor that Lisinopril may cause your cough or wheezing to be worse.

## 2016-01-14 NOTE — Assessment & Plan Note (Signed)
On oxygen 2.5 L/m

## 2016-01-14 NOTE — Progress Notes (Signed)
Subjective:    Patient ID: Ivan Parks, male    DOB: 09-11-1940, 75 y.o.   MRN: [id number]  HPI 75 yo male seen for initial pulmonary consult 11/04/10 for AECOPD w/ vent depend resp failure . Former smoker    TEST  10/2010  CT chest and head were neg.  10/2010 2D echo showed LV hypertrophy, EF of 60%.  2012 FEV1 36%, ratio 54    PULMONARY FUNCTON TEST 12/19/2010  FVC 1.71  FEV1 .93  FEV1/FVC 54.4  FVC  % Predicted 45  FEV % Predicted 36  FeF 25-75 .28  FeF 25-75 % Predicted 2.46    01/14/2016 Follow up COPD and Chronic O2  Patient returns for a 1 year follow-up. Says he has had thick mucus , little cough and dyspnea  for last 2 weeks . Seen PCP last week , was given nebulizer treatment in office. Started on Abx and steroid . He is starting to feel better.  Gets winded easily  Suppose to be on Stiolto., says he has been out of for 1 month.  CXR 01/2015 nad.  He remains on 2.5 L of oxygen. He denies any chest pain, hemoptysis , orthopnea, PND or leg swelling. Last cxr in June with no acute findings PVX utd. Declines Prevnar and Flu .   On ACE inhibitor, says very little cough . Just still has mucus in throat.    Past Medical History:  Diagnosis Date  . Acute respiratory failure (HCC)   . Bilateral lower extremity edema   . Bronchitis   . CHF (congestive heart failure) (HCC)   . COPD (chronic obstructive pulmonary disease) (HCC)   . DM (diabetes mellitus) (HCC)   . HTN (hypertension)   . Hypoxemia   . OSA (obstructive sleep apnea)   . Peripheral vascular disease (HCC)   . Tobacco abuse    . Current Outpatient Prescriptions on File Prior to Visit  Medication Sig Dispense Refill  . acetaminophen (TYLENOL) 500 MG tablet Take 500 mg by mouth every 8 (eight) hours as needed.    . furosemide (LASIX) 20 MG tablet Take 1 tablet (20 mg total) by mouth daily. 30 tablet 0  . Insulin Detemir (LEVEMIR [place]) Inject 10 Units into the skin at bedtime. Gets samples from dr    .  lisinopril-hydrochlorothiazide (PRINZIDE,ZESTORETIC) 20-25 MG per tablet Take 1 tablet by mouth daily.  1  . loratadine (CLARITIN) 10 MG tablet Take 10 mg by mouth daily.    . metFORMIN (GLUCOPHAGE) 500 MG tablet Take 1,000 mg by mouth 2 (two) times daily with a meal.    . ondansetron (ZOFRAN-ODT) 4 MG disintegrating tablet Take 1 tablet (4 mg total) by mouth every 8 (eight) hours as needed for nausea or vomiting. 8 tablet 0  . Tiotropium Bromide-Olodaterol (STIOLTO RESPIMAT) 2.5-2.5 MCG/ACT AERS Inhale 2 puffs into the lungs daily. 4 g 5   No current facility-administered medications on file prior to visit.     Review of Systems  Constitutional:   No  weight loss, night sweats,  Fevers, chills, + fatigue, or  lassitude.  HEENT:   No headaches,  Difficulty swallowing,  Tooth/dental problems, or  Sore throat,                No sneezing, itching, ear ache, nasal congestion, post nasal drip,   CV:  No chest pain,  Orthopnea, PND, swelling in lower extremities, anasarca, dizziness, palpitations, syncope.   GI  No heartburn, indigestion, abdominal pain, nausea,  vomiting, diarrhea, change in bowel habits, loss of appetite, bloody stools.   Resp:   No chest wall deformity  Skin: no rash or lesions.  GU: no dysuria, change in color of urine, no urgency or frequency.  No flank pain, no hematuria   MS:  No joint pain or swelling.  No decreased range of motion.  No back pain.  Psych:  No change in mood or affect. No depression or anxiety.  No memory loss.        Objective:   Physical Exam Vitals:   01/14/16 1045  BP: 118/68  Pulse: (!) 103  Temp: 97.9 F (36.6 C)  TempSrc: Oral  SpO2: 97%  Weight: 190 lb (86.2 kg)  Height: 5\' 5"  (1.651 m)    GEN: A/Ox3; pleasant , NAD, elderly    HEENT:  [place]/AT,  EACs-clear, TMs-wnl, NOSE-clear, THROAT-clear, no lesions, no postnasal drip or exudate noted.   NECK:  Supple w/ fair ROM; no JVD; normal carotid impulses w/o bruits; no thyromegaly  or nodules palpated; no lymphadenopathy.    RESP  Very distant, no crackles or wheeze.   CARD:  RRR, no m/r/g  , 1 peripheral edema, pulses intact, no cyanosis or clubbing.  Musco: Warm bil, no deformities or joint swelling noted.   Neuro: alert, no focal deficits noted.    Skin: Warm, no lesions or rashes   .Tammy Parrett NP-C  [facility]  01/14/2016

## 2016-01-14 NOTE — Assessment & Plan Note (Signed)
Severe COPD with recent exacerbation, now slowly resolving. Check chest x-ray and labs today including a BNP  Plan  Patient Instructions  Restart Stiolto 2 puffs daily  Chest xray and labs today .  Continue on Oxygen 2.5l/m  follow up Dr. Delton CoombesByrum in 3 months and As needed   Discuss with your primary doctor that Lisinopril may cause your cough or wheezing to be worse.

## 2016-01-14 NOTE — Progress Notes (Signed)
Called spoke with pt's daughter. Reviewed results and recs. She voiced understanding and had no further questions. Nothing further needed.

## 2016-01-23 ENCOUNTER — Telehealth: Payer: Self-pay | Admitting: Emergency Medicine

## 2016-01-23 NOTE — Telephone Encounter (Signed)
Spoke with pt. He is requesting samples of Stiolto. Samples will be left at the front desk. Nothing further was needed.

## 2016-03-19 ENCOUNTER — Encounter: Payer: Self-pay | Admitting: Internal Medicine

## 2016-03-26 ENCOUNTER — Other Ambulatory Visit: Payer: Self-pay | Admitting: Urology

## 2016-03-27 NOTE — Progress Notes (Signed)
Pt is being scheduled for preop appt; please place surgical orders in epic. Thanks.  

## 2016-03-31 ENCOUNTER — Emergency Department (HOSPITAL_COMMUNITY): Payer: Medicare Other

## 2016-03-31 ENCOUNTER — Other Ambulatory Visit: Payer: Self-pay

## 2016-03-31 ENCOUNTER — Encounter (HOSPITAL_COMMUNITY)
Admission: RE | Admit: 2016-03-31 | Discharge: 2016-03-31 | Disposition: A | Discharge status: Home / Self Care | Payer: Medicare Other | Source: Ambulatory Visit | Attending: Urology | Admitting: Urology

## 2016-03-31 ENCOUNTER — Inpatient Hospital Stay (HOSPITAL_COMMUNITY)
Admission: EM | Admit: 2016-03-31 | Discharge: 2016-04-01 | DRG: 312 | Disposition: A | Discharge status: Home / Self Care | Payer: Medicare Other | Attending: Internal Medicine | Admitting: Internal Medicine

## 2016-03-31 ENCOUNTER — Encounter (HOSPITAL_COMMUNITY): Payer: Self-pay | Admitting: Emergency Medicine

## 2016-03-31 ENCOUNTER — Encounter (HOSPITAL_COMMUNITY): Payer: Self-pay

## 2016-03-31 DIAGNOSIS — K521 Toxic gastroenteritis and colitis: Secondary | ICD-10-CM | POA: Diagnosis present

## 2016-03-31 DIAGNOSIS — Z87440 Personal history of urinary (tract) infections: Secondary | ICD-10-CM

## 2016-03-31 DIAGNOSIS — I952 Hypotension due to drugs: Secondary | ICD-10-CM | POA: Diagnosis not present

## 2016-03-31 DIAGNOSIS — E872 Acidosis: Secondary | ICD-10-CM

## 2016-03-31 DIAGNOSIS — E119 Type 2 diabetes mellitus without complications: Secondary | ICD-10-CM

## 2016-03-31 DIAGNOSIS — IMO0001 Reserved for inherently not codable concepts without codable children: Secondary | ICD-10-CM

## 2016-03-31 DIAGNOSIS — J449 Chronic obstructive pulmonary disease, unspecified: Secondary | ICD-10-CM

## 2016-03-31 DIAGNOSIS — T383X5A Adverse effect of insulin and oral hypoglycemic [antidiabetic] drugs, initial encounter: Secondary | ICD-10-CM | POA: Diagnosis present

## 2016-03-31 DIAGNOSIS — G4733 Obstructive sleep apnea (adult) (pediatric): Secondary | ICD-10-CM

## 2016-03-31 DIAGNOSIS — Z794 Long term (current) use of insulin: Secondary | ICD-10-CM

## 2016-03-31 DIAGNOSIS — E1165 Type 2 diabetes mellitus with hyperglycemia: Secondary | ICD-10-CM | POA: Diagnosis not present

## 2016-03-31 DIAGNOSIS — N179 Acute kidney failure, unspecified: Secondary | ICD-10-CM | POA: Diagnosis not present

## 2016-03-31 DIAGNOSIS — Z87891 Personal history of nicotine dependence: Secondary | ICD-10-CM

## 2016-03-31 DIAGNOSIS — Z9981 Dependence on supplemental oxygen: Secondary | ICD-10-CM | POA: Diagnosis not present

## 2016-03-31 DIAGNOSIS — Y92009 Unspecified place in unspecified non-institutional (private) residence as the place of occurrence of the external cause: Secondary | ICD-10-CM | POA: Diagnosis not present

## 2016-03-31 DIAGNOSIS — Z8674 Personal history of sudden cardiac arrest: Secondary | ICD-10-CM

## 2016-03-31 DIAGNOSIS — J42 Unspecified chronic bronchitis: Secondary | ICD-10-CM

## 2016-03-31 DIAGNOSIS — T501X5A Adverse effect of loop [high-ceiling] diuretics, initial encounter: Secondary | ICD-10-CM | POA: Diagnosis present

## 2016-03-31 DIAGNOSIS — Z01810 Encounter for preprocedural cardiovascular examination: Secondary | ICD-10-CM

## 2016-03-31 DIAGNOSIS — R0902 Hypoxemia: Secondary | ICD-10-CM | POA: Insufficient documentation

## 2016-03-31 DIAGNOSIS — J9610 Chronic respiratory failure, unspecified whether with hypoxia or hypercapnia: Secondary | ICD-10-CM | POA: Diagnosis present

## 2016-03-31 DIAGNOSIS — I959 Hypotension, unspecified: Secondary | ICD-10-CM | POA: Diagnosis present

## 2016-03-31 DIAGNOSIS — I509 Heart failure, unspecified: Secondary | ICD-10-CM | POA: Insufficient documentation

## 2016-03-31 DIAGNOSIS — Z8249 Family history of ischemic heart disease and other diseases of the circulatory system: Secondary | ICD-10-CM

## 2016-03-31 DIAGNOSIS — I10 Essential (primary) hypertension: Secondary | ICD-10-CM | POA: Diagnosis not present

## 2016-03-31 DIAGNOSIS — I110 Hypertensive heart disease with heart failure: Secondary | ICD-10-CM | POA: Insufficient documentation

## 2016-03-31 DIAGNOSIS — T502X5A Adverse effect of carbonic-anhydrase inhibitors, benzothiadiazides and other diuretics, initial encounter: Secondary | ICD-10-CM | POA: Diagnosis present

## 2016-03-31 DIAGNOSIS — E1151 Type 2 diabetes mellitus with diabetic peripheral angiopathy without gangrene: Secondary | ICD-10-CM | POA: Diagnosis present

## 2016-03-31 DIAGNOSIS — R739 Hyperglycemia, unspecified: Secondary | ICD-10-CM

## 2016-03-31 DIAGNOSIS — E860 Dehydration: Secondary | ICD-10-CM

## 2016-03-31 DIAGNOSIS — T464X5A Adverse effect of angiotensin-converting-enzyme inhibitors, initial encounter: Secondary | ICD-10-CM | POA: Diagnosis present

## 2016-03-31 DIAGNOSIS — Z833 Family history of diabetes mellitus: Secondary | ICD-10-CM

## 2016-03-31 HISTORY — DX: Hypotension, unspecified: I95.9

## 2016-03-31 LAB — COMPREHENSIVE METABOLIC PANEL
ALBUMIN: 3.5 g/dL (ref 3.5–5.0)
ALT: 16 U/L — ABNORMAL LOW (ref 17–63)
ANION GAP: 10 (ref 5–15)
AST: 21 U/L (ref 15–41)
Alkaline Phosphatase: 65 U/L (ref 38–126)
BILIRUBIN TOTAL: 0.5 mg/dL (ref 0.3–1.2)
BUN: 34 mg/dL — ABNORMAL HIGH (ref 6–20)
CO2: 27 mmol/L (ref 22–32)
Calcium: 8.9 mg/dL (ref 8.9–10.3)
Chloride: 98 mmol/L — ABNORMAL LOW (ref 101–111)
Creatinine, Ser: 1.55 mg/dL — ABNORMAL HIGH (ref 0.61–1.24)
GFR, EST AFRICAN AMERICAN: 49 mL/min — AB (ref >60)
GFR, EST NON AFRICAN AMERICAN: 42 mL/min — AB (ref >60)
Glucose, Bld: 353 mg/dL — ABNORMAL HIGH (ref 65–99)
POTASSIUM: 4.4 mmol/L (ref 3.5–5.1)
Sodium: 135 mmol/L (ref 135–145)
TOTAL PROTEIN: 6.8 g/dL (ref 6.5–8.1)

## 2016-03-31 LAB — CBC
HCT: 43.9 % (ref 39.0–52.0)
Hemoglobin: 14.4 g/dL (ref 13.0–17.0)
MCH: 27.7 pg (ref 26.0–34.0)
MCHC: 32.8 g/dL (ref 30.0–36.0)
MCV: 84.4 fL (ref 78.0–100.0)
PLATELETS: 169 10*3/uL (ref 150–400)
RBC: 5.2 MIL/uL (ref 4.22–5.81)
RDW: 12.4 % (ref 11.5–15.5)
WBC: 8.4 10*3/uL (ref 4.0–10.5)

## 2016-03-31 LAB — URINALYSIS, ROUTINE W REFLEX MICROSCOPIC
Bilirubin Urine: NEGATIVE
Glucose, UA: 500 mg/dL — AB
HGB URINE DIPSTICK: NEGATIVE
KETONES UR: NEGATIVE mg/dL
Leukocytes, UA: NEGATIVE
Nitrite: NEGATIVE
PROTEIN: NEGATIVE mg/dL
Specific Gravity, Urine: 1.013 (ref 1.005–1.030)
pH: 5.5 (ref 5.0–8.0)

## 2016-03-31 LAB — LACTIC ACID, PLASMA
LACTIC ACID, VENOUS: 1.2 mmol/L (ref 0.5–1.9)
Lactic Acid, Venous: 1 mmol/L (ref 0.5–1.9)

## 2016-03-31 LAB — I-STAT TROPONIN, ED: Troponin i, poc: 0.01 ng/mL (ref 0.00–0.08)

## 2016-03-31 LAB — I-STAT CG4 LACTIC ACID, ED: LACTIC ACID, VENOUS: 2 mmol/L — AB (ref 0.5–1.9)

## 2016-03-31 LAB — TROPONIN I: Troponin I: <0.03 ng/mL (ref <0.03)

## 2016-03-31 LAB — D-DIMER, QUANTITATIVE (NOT AT ARMC): D DIMER QUANT: 1.53 ug{FEU}/mL — AB (ref 0.00–0.50)

## 2016-03-31 LAB — TSH: TSH: 1.611 u[IU]/mL (ref 0.350–4.500)

## 2016-03-31 LAB — GLUCOSE, CAPILLARY: GLUCOSE-CAPILLARY: 282 mg/dL — AB (ref 65–99)

## 2016-03-31 MED ORDER — SODIUM CHLORIDE 0.9 % IV SOLN
INTRAVENOUS | Status: AC
  Administered 2016-03-31: 20:00:00 via INTRAVENOUS

## 2016-03-31 MED ORDER — ACETAMINOPHEN 325 MG PO TABS: 650.0000 mg | ORAL_TABLET | Freq: Four times a day (QID) | ORAL | Status: DC | PRN

## 2016-03-31 MED ORDER — TRAMADOL HCL 50 MG PO TABS: 50.0000 mg | ORAL_TABLET | Freq: Four times a day (QID) | ORAL | Status: DC | PRN

## 2016-03-31 MED ORDER — ACETAMINOPHEN 650 MG RE SUPP: 650.0000 mg | Freq: Four times a day (QID) | RECTAL | Status: DC | PRN

## 2016-03-31 MED ORDER — SENNOSIDES-DOCUSATE SODIUM 8.6-50 MG PO TABS: 1.0000 | ORAL_TABLET | Freq: Every evening | ORAL | Status: DC | PRN

## 2016-03-31 MED ORDER — ENOXAPARIN SODIUM 40 MG/0.4ML ~~LOC~~ SOLN
40.0000 mg | SUBCUTANEOUS | Status: DC
  Filled 2016-03-31: qty 0.4

## 2016-03-31 MED ORDER — IPRATROPIUM-ALBUTEROL 0.5-2.5 (3) MG/3ML IN SOLN: 3.0000 mL | Freq: Four times a day (QID) | RESPIRATORY_TRACT | Status: DC | PRN

## 2016-03-31 MED ORDER — INSULIN DETEMIR 100 UNIT/ML ~~LOC~~ SOLN
10.0000 [IU] | Freq: Every day | SUBCUTANEOUS | Status: DC
  Administered 2016-03-31: 10 [IU] via SUBCUTANEOUS
  Filled 2016-03-31 (×2): qty 0.1

## 2016-03-31 MED ORDER — INSULIN ASPART 100 UNIT/ML ~~LOC~~ SOLN
0.0000 [IU] | Freq: Three times a day (TID) | SUBCUTANEOUS | Status: DC
  Administered 2016-04-01 (×2): 3 [IU] via SUBCUTANEOUS

## 2016-03-31 MED ORDER — BISACODYL 10 MG RE SUPP
10.0000 mg | Freq: Every day | RECTAL | Status: DC | PRN
Start: 2016-03-31 — End: 2016-04-01

## 2016-03-31 MED ORDER — SODIUM CHLORIDE 0.9 % IV BOLUS (SEPSIS)
1000.0000 mL | Freq: Once | INTRAVENOUS | Status: AC
  Administered 2016-03-31: 1000 mL via INTRAVENOUS

## 2016-03-31 MED ORDER — ONDANSETRON HCL 4 MG/2ML IJ SOLN: 4.0000 mg | Freq: Four times a day (QID) | INTRAMUSCULAR | Status: DC | PRN

## 2016-03-31 MED ORDER — SODIUM CHLORIDE 0.9 % IV SOLN
INTRAVENOUS | Status: DC
  Administered 2016-04-01: 05:00:00 via INTRAVENOUS

## 2016-03-31 MED ORDER — ONDANSETRON HCL 4 MG PO TABS: 4.0000 mg | ORAL_TABLET | Freq: Four times a day (QID) | ORAL | Status: DC | PRN

## 2016-03-31 MED ORDER — SODIUM CHLORIDE 0.9% FLUSH
3.0000 mL | Freq: Two times a day (BID) | INTRAVENOUS | Status: DC
  Administered 2016-03-31: 3 mL via INTRAVENOUS

## 2016-03-31 NOTE — ED Provider Notes (Addendum)
[facility]-EMERGENCY DEPT Provider Note   CSN: [id number] Arrival date & time: 03/31/16  1438     History   Chief Complaint Chief Complaint  Patient presents with  . Hypotension    HPI Ivan Parks is a 75 y.o. male.  HPI Patient presents to the emergency room for evaluation of hypotension. Patient states he was in the preop area in preparation for an elective circumcision.  In the preop area the patient was noted to be hypotensive. He had several blood pressures measured in the 50s to 70s systolic. Patient denied any symptoms. He denied feeling dizzy, lightheaded, short of breath. He denied having any trouble with any chest pain. The patient states he was feeling well today and was driven in from home.  He was not having any complaints.  The patient was sent from the preop area to the emergency room for further evaluation.  He last took his BP meds this am.   Past Medical History:  Diagnosis Date  . Acute respiratory failure (HCC)   . Bilateral lower extremity edema   . Bronchitis   . CHF (congestive heart failure) (HCC)   . COPD (chronic obstructive pulmonary disease) (HCC)   . DM (diabetes mellitus) (HCC)   . HTN (hypertension)   . Hypoxemia   . OSA (obstructive sleep apnea)   . Peripheral vascular disease (HCC)   . Tobacco abuse     Patient Active Problem List   Diagnosis Date Noted  . Essential hypertension 03/06/2015  . Chronic respiratory failure (HCC) 11/26/2014  . Leg edema, right- Greater than Left leg 07/05/2014  . Peripheral vascular disease, unspecified 11/09/2013  . Allergic rhinitis 11/09/2012  . 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 COLON SURGERY         Home Medications    Prior to Admission medications   Medication Sig Start Date End Date Taking? Authorizing Provider  acetaminophen (TYLENOL) 500 MG tablet Take 500 mg by mouth every 8 (eight) hours as needed for mild pain.    Yes Historical  Provider, MD  furosemide (LASIX) 20 MG tablet Take 1 tablet (20 mg total) by mouth daily. 08/13/14  Yes Bethann Berkshire, MD  insulin detemir (LEVEMIR) 100 UNIT/ML injection Inject 10 Units into the skin at bedtime.   Yes Historical Provider, MD  lisinopril-hydrochlorothiazide (PRINZIDE,ZESTORETIC) 20-25 MG per tablet Take 1 tablet by mouth daily. 05/08/14  Yes Historical Provider, MD  metFORMIN (GLUCOPHAGE) 500 MG tablet Take 1,000 mg by mouth 2 (two) times daily with a meal.   Yes Historical Provider, MD  Tiotropium Bromide-Olodaterol (STIOLTO RESPIMAT) 2.5-2.5 MCG/ACT AERS Inhale 2 puffs into the lungs daily. 01/14/16  Yes Tammy Rogers Seeds, NP    Family History Family History  Problem Relation Age of Onset  . Diabetes Mother   . Hypertension Mother   . Peripheral vascular disease Mother     amputation  . Diabetes Father     Right Leg Amputation-Gangrene  . Hypertension Father   . Peripheral vascular disease Father     amputation  . Cancer Father     Lead Poison-Ca  . Pneumonia Father   . Diabetes Sister   . Hypertension Sister   . Diabetes Daughter   . Hypertension Daughter     Social History Social History  Substance Use Topics  . Smoking status: Former Smoker    Packs/day: 1.50    Years: 50.00    Types: Cigarettes  Quit date: 11/04/2010  . Smokeless tobacco: Never Used  . Alcohol use No     Allergies   Patient has no known allergies.   Review of Systems Review of Systems  Constitutional: Negative for fever.  HENT: Negative for sore throat.   Respiratory: Negative for chest tightness and shortness of breath.   Cardiovascular: Negative for chest pain.  Gastrointestinal: Negative for abdominal pain.  Genitourinary: Negative for enuresis.  Neurological: Negative for seizures.  Psychiatric/Behavioral: Negative for confusion.  All other systems reviewed and are negative.    Physical Exam Updated Vital Signs BP (!) 91/54   Pulse 103   Resp 16   SpO2 100%    Physical Exam  Constitutional: He appears well-developed and well-nourished. No distress.  HENT:  Head: Normocephalic and atraumatic.  Right Ear: External ear normal.  Left Ear: External ear normal.  Eyes: Conjunctivae are normal. Right eye exhibits no discharge. Left eye exhibits no discharge. No scleral icterus.  Neck: Neck supple. No tracheal deviation present.  Cardiovascular: Normal rate, regular rhythm and intact distal pulses.   Pulmonary/Chest: Effort normal and breath sounds normal. No stridor. No respiratory distress. He has no wheezes. He has no rales.  Abdominal: Soft. Bowel sounds are normal. He exhibits no distension. There is no tenderness. There is no rebound and no guarding.  Genitourinary: Penis normal.  Musculoskeletal: He exhibits edema. He exhibits no tenderness.  Chronic edema of the lower extremities, venous stasis changes of the skin  Neurological: He is alert. He has normal strength. No cranial nerve deficit (no facial droop, extraocular movements intact, no slurred speech) or sensory deficit. He exhibits normal muscle tone. He displays no seizure activity. Coordination normal.  Skin: Skin is warm and dry. No rash noted.  Psychiatric: He has a normal mood and affect.  Nursing note and vitals reviewed.    ED Treatments / Results  Labs (all labs ordered are listed, but only abnormal results are displayed) Labs Reviewed  URINALYSIS, ROUTINE W REFLEX MICROSCOPIC (NOT [facility]) - Abnormal; Notable for the following:       Result Value   Glucose, UA 500 (*)    All other components within normal limits  COMPREHENSIVE METABOLIC PANEL - Abnormal; Notable for the following:    Chloride 98 (*)    Glucose, Bld 353 (*)    BUN 34 (*)    Creatinine, Ser 1.55 (*)    ALT 16 (*)    GFR calc non Af Amer 42 (*)    GFR calc Af Amer 49 (*)    All other components within normal limits  I-STAT CG4 LACTIC ACID, ED - Abnormal; Notable for the following:    Lactic Acid, Venous  2.00 (*)    All other components within normal limits  CULTURE, BLOOD (ROUTINE X 2)  CULTURE, BLOOD (ROUTINE X 2)  URINE CULTURE  CBC  I-STAT TROPOININ, ED    EKG  EKG Interpretation  Date/Time:  Tuesday March 31 2016 14:54:07 EST Ventricular Rate:  101 PR Interval:    QRS Duration: 89 QT Interval:  328 QTC Calculation: 426 R Axis:   44 Text Interpretation:  Sinus tachycardia Sinus pause No significant change since last tracing Confirmed by Kinsly Hild  MD-J, Yehudah Standing ([id number]) on 03/31/2016 3:06:16 PM       Radiology Dg Chest Port 1 View  Result Date: 03/31/2016 CLINICAL DATA:  Low blood pressure EXAM: PORTABLE CHEST 1 VIEW COMPARISON:  01/14/2016 FINDINGS: Heart and mediastinal contours are within normal limits.  No focal opacities or effusions. No acute bony abnormality. IMPRESSION: No active disease. Electronically Signed   By: Charlett NoseKevin  Dover M.D.   On: 03/31/2016 16:41    Procedures Procedures (including critical care time)  Medications Ordered in ED Medications  sodium chloride 0.9 % bolus 1,000 mL (not administered)  sodium chloride 0.9 % bolus 1,000 mL (not administered)  sodium chloride 0.9 % bolus 1,000 mL (0 mLs Intravenous Stopped 03/31/16 1549)     Initial Impression / Assessment and Plan / ED Course  I have reviewed the triage vital signs and the nursing notes.  Pertinent labs & imaging results that were available during my care of the patient were reviewed by me and considered in my medical decision making (see chart for details).  Clinical Course as of Mar 31 1725  Tue Mar 31, 2016  1520 Initial blood pressure in the emergency room is 116 systolic. He appears stable. No complaints to suggest bleeding.  No chest pain or shortness of breath.  No infectious symptoms. We will proceed with further evaluation  [JK]  1723 BP trending down again.  In the 90s systolic.  Lactic acid slightly elevated.  No WBC elevated.  No UTI  [JK]  1723 No PNA.  Doubt sepsis.    [JK]    1724 Blood sugar and creatinine more elevated.  Could be related to dehydration with his hyperglycemia as well as decreased need for BP meds ( he did take them this am.).  Will continue repeat lactic.  Consult with medical service for admission.  [JK]    Clinical Course User Index [JK] Linwood DibblesJon Neyah Ellerman, MD     Final Clinical Impressions(s) / ED Diagnoses   Final diagnoses:  Hypotension, unspecified hypotension type  Dehydration      Linwood DibblesJon Amandeep Hogston, MD 03/31/16 1726    Linwood DibblesJon Norabelle Kondo, MD 03/31/16 1750

## 2016-03-31 NOTE — Progress Notes (Signed)
Initial BP upon arrival to Pre-Admissions- BP-78/45,  HR- 122, O2 sat-93 %, then BP-59/42, HR-122, O2 sat-91%, then BP-70/42, HR-118, O2 sat- 92 % all oxygen saturations on Oxygen at 3 liters/min. Nasal cannula.  Lossie FaesPam Gibson, scheduler notified of this and to let Dr. Patsi Searsannenbaum know . Patient states has been having problems with being dizzy and has a sense of falling.   EKG obtained.  223 pm- BP-76/50, HR-113, O2 sat.-92%.  Dr. Desmond Lopeurk notified of vital signs and informed me to take patient to the Emergency Room.

## 2016-03-31 NOTE — ED Notes (Signed)
Bed: RESA Expected date:  Expected time:  Means of arrival:  Comments: HYPOTENSION

## 2016-03-31 NOTE — ED Triage Notes (Signed)
Pt sent for hypotension 78/42, O2 89% on 3 L/min, dizziness, sensation of falling. Pt was consulting for circumcision surgery. [place] 3 L/min O2 at home. O2 currently 100% on 3 L/min, BP currently 96/75.

## 2016-03-31 NOTE — ED Notes (Signed)
Patient made aware that urine sample is needed. Patient states he cannot void at this time. Patient given an urinal and encouraged to void when able.

## 2016-03-31 NOTE — ED Notes (Signed)
Hospitalist at bedside 

## 2016-03-31 NOTE — Progress Notes (Signed)
Hand-off report given to Dorene GrebeNatalie, [facility]. Given information to her about what went on in Pre-Admissions with Vital Signs and notes are in EPIC.

## 2016-03-31 NOTE — H&P (Signed)
History and Physical    Ivan Parks UEA:[id number] DOB: March 15, 1941 DOA: 03/31/2016  PCP: Cala Bradford, MD   Patient coming from: Home  Chief Complaint: Hypotension  HPI: Ivan Parks is a 75 y.o. male with medical history significant of Hypertension, COPD, PVD, OSA, Prior Tobacco Abuse, Hx of Cardiac Arrest 5 years ago, IDDM who presented to [facility] ED from Preoperative Clearance for a Circumcision. 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 Because of Concern patient was transferred to [facility] ED for Evaluation where he was bolused 2 Liters with interval improvement in his BP however it still remained low. Patient denied any active complaints except mild dizziness and sensation of falling earlier which have now resolved. Hospitalist was consulted to Admit for Hypotension.   ED Course: Bolused 2 Liters in the Ed. Had Labwork Done.   Review of Systems: As per HPI otherwise 10 point review of systems negative. Denied, N/V, Fevers, Chills, Nightsweats, Weight loss or weight gain, Burning in Urine, Cough or SOB. No Cp, Blood in Stools or painful defecation.  Past Medical History:  Diagnosis Date  . Acute respiratory failure (HCC)   . Bilateral lower extremity edema   . Bronchitis   . CHF (congestive heart failure) (HCC)   . COPD (chronic obstructive pulmonary disease) (HCC)   . DM (diabetes mellitus) (HCC)   . HTN (hypertension)   . Hypoxemia   . OSA (obstructive sleep apnea)   . Peripheral vascular disease (HCC)   . Tobacco abuse     Past Surgical History:  Procedure Laterality Date  . COLON SURGERY     SOCIAL HISTORY  reports that he quit smoking about 5 years ago. His smoking use included Cigarettes. He has a 75.00 pack-year smoking history. He has never used smokeless tobacco. He reports that he does not drink alcohol or use  drugs.  No Known Allergies  Family History  Problem Relation Age of Onset  . Diabetes Mother   . Hypertension Mother   . Peripheral vascular disease Mother     amputation  . Diabetes Father     Right Leg Amputation-Gangrene  . Hypertension Father   . Peripheral vascular disease Father     amputation  . Cancer Father     Lead Poison-Ca  . Pneumonia Father   . Diabetes Sister   . Hypertension Sister   . Diabetes Daughter   . Hypertension Daughter     Prior to Admission medications   Medication Sig Start Date End Date Taking? Authorizing Provider  acetaminophen (TYLENOL) 500 MG tablet Take 500 mg by mouth every 8 (eight) hours as needed for mild pain.    Yes Historical Provider, MD  furosemide (LASIX) 20 MG tablet Take 1 tablet (20 mg total) by mouth daily. 08/13/14  Yes Bethann Berkshire, MD  insulin detemir (LEVEMIR) 100 UNIT/ML injection Inject 10 Units into the skin at bedtime.   Yes Historical Provider, MD  lisinopril-hydrochlorothiazide (PRINZIDE,ZESTORETIC) 20-25 MG per tablet Take 1 tablet by mouth daily. 05/08/14  Yes Historical Provider, MD  metFORMIN (GLUCOPHAGE) 500 MG tablet Take 1,000 mg by mouth 2 (two) times daily with a meal.   Yes Historical Provider, MD  Tiotropium Bromide-Olodaterol (STIOLTO RESPIMAT) 2.5-2.5 MCG/ACT AERS Inhale 2 puffs into the lungs daily. 01/14/16  Yes Tammy Rogers Seeds, NP    Physical Exam: Vitals:  03/31/16 1700 03/31/16 1730 03/31/16 1745 03/31/16 1800  BP: (!) 91/54 101/66 109/74 105/62  Pulse: 103 104 105 106  Resp: 16 14 12 23   SpO2: 100% 98% 99% 99%   Constitutional: WN/WD, NAD and appears calm and comfortable Eyes: Lids and conjunctivae normal, sclerae anicteric  ENMT: External Ears, Nose appear normal. Grossly normal hearing. Mucous Membranes slightly dry.  Neck: Appears normal, supple, no cervical masses, normal ROM, no appreciable thyromegaly Respiratory: Clear to auscultation bilaterally, no wheezing, rales, rhonchi or crackles.  Normal respiratory effort and patient is not tachypenic. No accessory muscle use wearing O2 via [place].  Cardiovascular: Tachycardic Rhythm. No appreciable murmurs / rubs / gallops. S1 and S2 auscultated.  Abdomen: Soft, non-tender, non-distended. No masses palpated. No appreciable hepatosplenomegaly. Bowel sounds positive x4. GU: Deferred. Musculoskeletal: No clubbing / cyanosis of digits/nails.  Skin: Right LE Venous Stasis changes.  Neurologic: CN 2-12 grossly intact with no focal deficits. Sensation intact in all 4 Extremities, Romberg sign cerebellar reflexes not assessed.  Psychiatric: Normal judgment and insight. Alert and oriented x 3. Normal mood and appropriate affect.   Labs on Admission: I have personally reviewed following labs and imaging studies  CBC:  Recent Labs Lab 03/31/16 1512  WBC 8.4  HGB 14.4  HCT 43.9  MCV 84.4  PLT 169   Basic Metabolic Panel:  Recent Labs Lab 03/31/16 1541  NA 135  K 4.4  CL 98*  CO2 27  GLUCOSE 353*  BUN 34*  CREATININE 1.55*  CALCIUM 8.9   GFR: CrCl cannot be calculated (Unknown ideal weight.). Liver Function Tests:  Recent Labs Lab 03/31/16 1541  AST 21  ALT 16*  ALKPHOS 65  BILITOT 0.5  PROT 6.8  ALBUMIN 3.5   No results for input(s): LIPASE, AMYLASE in the last 168 hours. No results for input(s): AMMONIA in the last 168 hours. Coagulation Profile: No results for input(s): INR, PROTIME in the last 168 hours. Cardiac Enzymes: No results for input(s): CKTOTAL, CKMB, CKMBINDEX, TROPONINI in the last 168 hours. BNP (last 3 results) No results for input(s): PROBNP in the last 8760 hours. HbA1C: No results for input(s): HGBA1C in the last 72 hours. CBG: No results for input(s): GLUCAP in the last 168 hours. Lipid Profile: No results for input(s): CHOL, HDL, LDLCALC, TRIG, CHOLHDL, LDLDIRECT in the last 72 hours. Thyroid Function Tests: No results for input(s): TSH, T4TOTAL, FREET4, T3FREE, THYROIDAB in the last 72  hours. Anemia Panel: No results for input(s): VITAMINB12, FOLATE, FERRITIN, TIBC, IRON, RETICCTPCT in the last 72 hours. Urine analysis:    Component Value Date/Time   COLORURINE YELLOW 03/31/2016 1623   APPEARANCEUR CLEAR 03/31/2016 1623   LABSPEC 1.013 03/31/2016 1623   PHURINE 5.5 03/31/2016 1623   GLUCOSEU 500 (A) 03/31/2016 1623   HGBUR NEGATIVE 03/31/2016 1623   BILIRUBINUR NEGATIVE 03/31/2016 1623   KETONESUR NEGATIVE 03/31/2016 1623   PROTEINUR NEGATIVE 03/31/2016 1623   UROBILINOGEN 0.2 09/19/2011 1701   NITRITE NEGATIVE 03/31/2016 1623   LEUKOCYTESUR NEGATIVE 03/31/2016 1623   Sepsis Labs: !!!!!!!!!!!!!!!!!!!!!!!!!!!!!!!!!!!!!!!!!!!! @LABRCNTIP (procalcitonin:4,lacticidven:4) )No results found for this or any previous visit (from the past 240 hour(s)).   Radiological Exams on Admission: Dg Chest Port 1 View  Result Date: 03/31/2016 CLINICAL DATA:  Low blood pressure EXAM: PORTABLE CHEST 1 VIEW COMPARISON:  01/14/2016 FINDINGS: Heart and mediastinal contours are within normal limits. No focal opacities or effusions. No acute bony abnormality. IMPRESSION: No active disease. Electronically Signed   By: Charlett NoseKevin  Dover M.D.  On: 03/31/2016 16:41    EKG: Ordered and Pending to be done.   Assessment/Plan Active Problems:   Hypotension  Hypotension with SIRS 2/2 to Unclear Etiology - ? Medication Induced from Diuretics -Improving with IVF Rehydration; Patient on Home HCTZ and Lasix -C/w IVF Saline at 100 mL/hr -Patient complained of Some Urinary Discomfort on and off -Blood Cx, Sputum Cx, Urine Cx\ -CXR showed Heart and mediastinal contours are within normal limits. No focal opacities or effusions. No acute bony abnormality. 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 -Cr Last year basline of 1.2; Cr this Time was 1.55 -C/w IVF Hydration  Hyperglycemia in the Setting of Insulin Dependent Diabetes Mellitus -C/w Home Levemir -Hold Metformin because of AKI and Lactic Acidosis -Novolog Moderate SSI  COPD with Chronic Respiratory Failure -Currently not in Exacerbation -DuoNebs q6hprn  Sinus Tachycardia likely 2/2 to Dehydration -C/w IVF Rehydration -Telemetry  -Will check D-Dimer and then pursue PE W/U if Elevated as patient was Hypotensive  DVT prophylaxis: Lovenox Code Status: FULL Family Communication: Discussed with Wife at Bedside Disposition Plan: Admit to Telemetry Consults called: None Admission status: Inpatient Telemetry  PortlandOmair Latif Anik Wesch, D.O. [facility] Pager [phone]  If 7PM-7AM, please contact night-coverage [email or web address] Password [email or web address]  03/31/2016, 6:23 PM

## 2016-03-31 NOTE — ED Notes (Signed)
X-ray at bedside

## 2016-03-31 NOTE — Progress Notes (Signed)
Called Emergency Room and talked to charge nurse-Stacey and given vital signs and information given to her and will take patient to the ED.

## 2016-04-01 ENCOUNTER — Inpatient Hospital Stay (HOSPITAL_BASED_OUTPATIENT_CLINIC_OR_DEPARTMENT_OTHER): Payer: Medicare Other

## 2016-04-01 DIAGNOSIS — E119 Type 2 diabetes mellitus without complications: Secondary | ICD-10-CM | POA: Diagnosis not present

## 2016-04-01 DIAGNOSIS — I951 Orthostatic hypotension: Secondary | ICD-10-CM

## 2016-04-01 DIAGNOSIS — N179 Acute kidney failure, unspecified: Secondary | ICD-10-CM | POA: Diagnosis present

## 2016-04-01 DIAGNOSIS — I959 Hypotension, unspecified: Secondary | ICD-10-CM

## 2016-04-01 DIAGNOSIS — Z794 Long term (current) use of insulin: Secondary | ICD-10-CM

## 2016-04-01 DIAGNOSIS — J9611 Chronic respiratory failure with hypoxia: Secondary | ICD-10-CM

## 2016-04-01 DIAGNOSIS — I952 Hypotension due to drugs: Secondary | ICD-10-CM | POA: Diagnosis not present

## 2016-04-01 DIAGNOSIS — R000 Tachycardia, unspecified: Secondary | ICD-10-CM

## 2016-04-01 DIAGNOSIS — Z9981 Dependence on supplemental oxygen: Secondary | ICD-10-CM

## 2016-04-01 HISTORY — DX: Acute kidney failure, unspecified: N17.9

## 2016-04-01 LAB — GLUCOSE, CAPILLARY: Glucose-Capillary: 184 mg/dL — ABNORMAL HIGH (ref 65–99)

## 2016-04-01 LAB — URINE CULTURE: Culture: NO GROWTH

## 2016-04-01 LAB — COMPREHENSIVE METABOLIC PANEL
ALT: 14 U/L — AB (ref 17–63)
ANION GAP: 8 (ref 5–15)
AST: 21 U/L (ref 15–41)
Albumin: 3.1 g/dL — ABNORMAL LOW (ref 3.5–5.0)
Alkaline Phosphatase: 60 U/L (ref 38–126)
BUN: 27 mg/dL — ABNORMAL HIGH (ref 6–20)
CHLORIDE: 104 mmol/L (ref 101–111)
CO2: 26 mmol/L (ref 22–32)
CREATININE: 1.16 mg/dL (ref 0.61–1.24)
Calcium: 8.2 mg/dL — ABNORMAL LOW (ref 8.9–10.3)
GFR, EST NON AFRICAN AMERICAN: 60 mL/min — AB (ref >60)
Glucose, Bld: 270 mg/dL — ABNORMAL HIGH (ref 65–99)
POTASSIUM: 4.3 mmol/L (ref 3.5–5.1)
Sodium: 138 mmol/L (ref 135–145)
Total Bilirubin: 0.7 mg/dL (ref 0.3–1.2)
Total Protein: 6.3 g/dL — ABNORMAL LOW (ref 6.5–8.1)

## 2016-04-01 LAB — TROPONIN I

## 2016-04-01 LAB — CBC
HCT: 41.5 % (ref 39.0–52.0)
HEMOGLOBIN: 13.5 g/dL (ref 13.0–17.0)
MCH: 27.6 pg (ref 26.0–34.0)
MCHC: 32.5 g/dL (ref 30.0–36.0)
MCV: 84.9 fL (ref 78.0–100.0)
PLATELETS: 158 10*3/uL (ref 150–400)
RBC: 4.89 MIL/uL (ref 4.22–5.81)
RDW: 12.5 % (ref 11.5–15.5)
WBC: 7.3 10*3/uL (ref 4.0–10.5)

## 2016-04-01 LAB — ECHOCARDIOGRAM COMPLETE
Height: 65 in
Weight: 3056 oz

## 2016-04-01 MED ORDER — LISINOPRIL 10 MG PO TABS: 10.0000 mg | ORAL_TABLET | Freq: Every day | ORAL | 0 refills | Status: DC

## 2016-04-01 MED ORDER — FUROSEMIDE 20 MG PO TABS: 20.0000 mg | ORAL_TABLET | ORAL | 0 refills | Status: DC | PRN

## 2016-04-01 NOTE — Progress Notes (Signed)
Inpatient Diabetes Program Recommendations  AACE/ADA: New Consensus Statement on Inpatient Glycemic Control (2015)  Target Ranges:  Prepandial:   less than 140 mg/dL      Peak postprandial:   less than 180 mg/dL (1-2 hours)      Critically ill patients:  140 - 180 mg/dL   Lab Results  Component Value Date   GLUCAP 184 (H) 04/01/2016   Pt having US. Will check back before 3:30 pm.  Thank you. Ailene Ardshonda Haroldine Redler, RD, LDN, CDE Inpatient Diabetes Coordinator [phone]

## 2016-04-01 NOTE — Progress Notes (Signed)
OT Cancellation Note  Patient Details Name: Ivan Parks MRN: [id number] DOB: Apr 12, 1941   Cancelled Treatment:    Reason Eval/Treat Not Completed: PT screened, no needs identified, will sign off  Ivan Parks 04/01/2016, 2:07 PM  Ivan Parks, OTR/L [phone] 04/01/2016

## 2016-04-01 NOTE — Care Management Obs Status (Signed)
MEDICARE OBSERVATION STATUS NOTIFICATION   Patient Details  Name: Ivan Parks MRN: [id number] Date of Birth: 1940/06/18   Medicare Observation Status Notification Given:  Yes    Golda AcreDavis, Rhonda Lynn, RN 04/01/2016, 4:25 PM

## 2016-04-01 NOTE — Progress Notes (Signed)
RN assumed care of Pt. Discharge instructions given to Pt and spouse, verbalized understanding. Awaiting family for transportation home.

## 2016-04-01 NOTE — Evaluation (Signed)
Physical Therapy One Time Evaluation Patient Details Name: Stephan MinisterLarry L Borunda MRN: [id number] DOB: [birth year] Today's Date: 04/01/2016   History of Present Illness   75 y.o. male with medical history significant of Hypertension, COPD, PVD, OSA, Prior Tobacco Abuse, Hx of Cardiac Arrest 5 years ago, IDDM [facility] for a Circumcision and found to be hypotensive  Clinical Impression  Patient evaluated by Physical Therapy with no further acute PT needs identified. All education has been completed and the patient has no further questions. Pt mobilizing well and reports no dizziness. Obtained orthostatics prior to gait (see below).  See below for any follow-up Physical Therapy or equipment needs. PT is signing off. Thank you for this referral.    04/01/16 1045  Vital Signs  BP Location Left Arm  BP Method Automatic  Patient Position (if appropriate) Orthostatic Vitals  Orthostatic Lying   BP- Lying 120/56  Pulse- Lying 98  Orthostatic Sitting  BP- Sitting 128/82  Pulse- Sitting 98  Orthostatic Standing at 0 minutes  BP- Standing at 0 minutes 118/69  Pulse- Standing at 0 minutes 98  Orthostatic Standing at 3 minutes  BP- Standing at 3 minutes 125/80  Pulse- Standing at 3 minutes 102  Oxygen Therapy  SpO2 98 %  O2 Device Nasal Cannula  O2 Flow Rate (L/min) 2.5 L/min       Follow Up Recommendations No PT follow up    Equipment Recommendations  None recommended by PT    Recommendations for Other Services       Precautions / Restrictions Precautions Precaution Comments: chronic oxygen      Mobility  Bed Mobility Overal bed mobility: Modified Independent                Transfers Overall transfer level: Needs assistance Equipment used: None Transfers: Sit to/from Stand Sit to Stand: Supervision            Ambulation/Gait Ambulation/Gait assistance: Supervision Ambulation Distance (Feet): 350 Feet Assistive device:  None Gait Pattern/deviations: WFL(Within Functional Limits)     General Gait Details: pt pushed IV pole, has cane and RW at home if needed, no unsteadiness or LOB observed, pt denies dizziness and SOB, remained on 3L O2 [place]  Stairs            Wheelchair Mobility    Modified Rankin (Stroke Patients Only)       Balance                                             Pertinent Vitals/Pain Pain Assessment: No/denies pain    Home Living Family/patient expects to be discharged to:: Private residence Living Arrangements: Spouse/significant other   Type of Home: House       Home Layout: One level Home Equipment: Environmental consultantWalker - 2 wheels;Cane - single point      Prior Function Level of Independence: Independent         Comments: chronic 2.5 L O2 [place]     Hand Dominance        Extremity/Trunk Assessment               Lower Extremity Assessment: Overall WFL for tasks assessed      Cervical / Trunk Assessment: Kyphotic;Other exceptions  Communication   Communication: No difficulties  Cognition Arousal/Alertness: Awake/alert Behavior During Therapy: WFL for tasks assessed/performed Overall  Cognitive Status: Within Functional Limits for tasks assessed                      General Comments      Exercises     Assessment/Plan    PT Assessment Patent does not need any further PT services  PT Problem List            PT Treatment Interventions      PT Goals (Current goals can be found in the Care Plan section)  Acute Rehab PT Goals PT Goal Formulation: All assessment and education complete, DC therapy    Frequency     Barriers to discharge        Co-evaluation               End of Session Equipment Utilized During Treatment: Oxygen Activity Tolerance: Patient tolerated treatment well Patient left: in bed;with call bell/phone within reach           Time: 1045-1107 PT Time Calculation (min) (ACUTE ONLY): 22  min   Charges:   PT Evaluation $PT Eval Low Complexity: 1 Procedure     PT G Codes:        Taytem Ghattas,KATHrine E 04/01/2016, 12:07 PM Zenovia JarredKati Tamas Suen, PT, DPT 04/01/2016 Pager: [phone]
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
## 2016-04-01 NOTE — Progress Notes (Signed)
Echocardiogram 2D Echocardiogram has been performed.  Marisue Humblelexis N Jerron Niblack 04/01/2016, 12:56 PM

## 2016-04-01 NOTE — Progress Notes (Signed)
Inpatient Diabetes Program Recommendations  AACE/ADA: New Consensus Statement on Inpatient Glycemic Control (2015)  Target Ranges:  Prepandial:   less than 140 mg/dL      Peak postprandial:   less than 180 mg/dL (1-2 hours)      Critically ill patients:  140 - 180 mg/dL   Lab Results  Component Value Date   GLUCAP 184 (H) 04/01/2016    Review of Glycemic Control  Diabetes history: DM2 Outpatient Diabetes medications: Levemir 10 units QHS, metformin 1000 mg bid Current orders for Inpatient glycemic control: Levemir 10 units QHS, Novolog 0-15 units tidwc  Spoke with pt and wife regarding pt's glycemic control at home. Pt states he takes his insulin "when he needs to." Does not check blood sugars on a regular basis. Wife states pt does not follow a healthy diet at home. Very little exercise. Stressed importance of monitoring blood sugars and taking logbook to MD for any needed insulin adjustments. Has scheduled appt with endocrinologist.   Awaiting HgbA1C results. Would probably benefit from addition of meal coverage insulin at home as post-prandial blood sugars are elevated, as compliance is issue with insulin administration. Needs a lot of support from family.  Pt requests prescription for glucose monitoring strips.  Thank you. Ailene Ardshonda Dermot Gremillion, RD, LDN, CDE Inpatient Diabetes Coordinator [phone]

## 2016-04-01 NOTE — Progress Notes (Signed)
[facility] scheduling of pts inpatient status

## 2016-04-01 NOTE — Care Management CC44 (Signed)
Condition Code 44 Documentation Completed  Patient Details  Name: Stephan MinisterLarry L Kelliher MRN: [id number] Date of Birth: [birth year]   Condition Code 44 given:  Yes Patient signature on Condition Code 44 notice:  Yes Documentation of 2 MD's agreement:  Yes Code 44 added to claim:  Yes    Golda Acreavis, Rhonda Lynn, RN 04/01/2016, 4:25 PM

## 2016-04-01 NOTE — Progress Notes (Signed)
Pt left the unit in stable condition, transported home by family.

## 2016-04-02 ENCOUNTER — Ambulatory Visit (HOSPITAL_COMMUNITY): Admission: RE | Admit: 2016-04-02 | Payer: Medicare Other | Source: Ambulatory Visit | Admitting: Urology

## 2016-04-02 ENCOUNTER — Encounter (HOSPITAL_COMMUNITY): Admission: RE | Payer: Self-pay | Source: Ambulatory Visit

## 2016-04-02 LAB — GLUCOSE, CAPILLARY: Glucose-Capillary: 169 mg/dL — ABNORMAL HIGH (ref 65–99)

## 2016-04-02 LAB — HEMOGLOBIN A1C
Hgb A1c MFr Bld: 15.4 % — ABNORMAL HIGH (ref 4.8–5.6)
Mean Plasma Glucose: 395 mg/dL

## 2016-04-02 SURGERY — CIRCUMCISION, ADULT
Anesthesia: General

## 2016-04-05 LAB — CULTURE, BLOOD (ROUTINE X 2)
CULTURE: NO GROWTH
Culture: NO GROWTH

## 2016-04-07 NOTE — Progress Notes (Signed)
Physical Therapy Evaluation G-Codes    04/01/16 1206  PT Time Calculation  PT Start Time (ACUTE ONLY) 1045  PT Stop Time (ACUTE ONLY) 1107  PT Time Calculation (min) (ACUTE ONLY) 22 min  PT G-Codes **NOT FOR INPATIENT CLASS**  Functional Assessment Tool Used clinical judgement  Functional Limitation Mobility: Walking and moving around  Mobility: Walking and Moving Around Current Status (Z6109(G8978) CI  Mobility: Walking and Moving Around Goal Status (U0454(G8979) CI  Mobility: Walking and Moving Around Discharge Status (U9811(G8980) CI  PT General Charges  $$ ACUTE PT VISIT 1 Procedure  PT Evaluation  $PT Eval Low Complexity 1 Procedure   Zenovia JarredKati Jil Penland, PT, DPT 04/07/2016 Pager: [phone]

## 2016-04-16 ENCOUNTER — Ambulatory Visit (INDEPENDENT_AMBULATORY_CARE_PROVIDER_SITE_OTHER): Payer: Medicare Other | Admitting: Emergency Medicine

## 2016-04-16 ENCOUNTER — Encounter: Payer: Self-pay | Admitting: Emergency Medicine

## 2016-04-16 DIAGNOSIS — J449 Chronic obstructive pulmonary disease, unspecified: Secondary | ICD-10-CM | POA: Diagnosis not present

## 2016-04-16 NOTE — Patient Instructions (Addendum)
Please continue your Stiolto as yo have been taking it, 2 sprays once a day We will write a prescription for albuterol. Take albuterol 2 puffs up to every 4 hours if needed for shortness of breath.  We offered the flu shot today. You would probably benefit from it.  We discussed getting the Prevnar-13 pneumonia shot today. You should think about getting this.  Follow with Dr Delton CoombesByrum in 6 months or sooner if you have any problems
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
## 2016-04-16 NOTE — Assessment & Plan Note (Signed)
Appears to be stable for the most part. No exacerbations according to his daughter or his wife. We will continue Stiolto as he is taking it. He needs to have albuterol available to use when necessary and we will give a prescription for this. I offered him the flu shot and the Prevnar 13. He is not interested in having either. He is compliant with his oxygen and will wear 2.5 L/m at all times.

## 2016-04-26 NOTE — Progress Notes (Signed)
Subjective:    Patient ID: Ivan Parks, male    DOB: [birth year], 75 y.o.   MRN: [id number]  HPI pt is referred by Dr Cliffton AstersWhite, for diabetes.  Pt states DM was dx'ed in 2010; he has mild neuropathy of the lower extremities; he has associated PAD and renal insufficiency; he has been on insulin since 2016; pt says his diet and exercise are poor; he has never had pancreatitis, severe hypoglycemia or DKA.  He takes levemir, 20 units qd.  He says he misses the insulin approx twice a week. Since on 20 units qd, cbg's are in the 200's.  He says he cannot afford the insulin.  Past Medical History:  Diagnosis Date  . Acute respiratory failure (HCC)   . Bilateral lower extremity edema   . Bronchitis   . CHF (congestive heart failure) (HCC)   . COPD (chronic obstructive pulmonary disease) (HCC)   . DM (diabetes mellitus) (HCC)   . HTN (hypertension)   . Hypoxemia   . OSA (obstructive sleep apnea)   . Peripheral vascular disease (HCC)   . Tobacco abuse     Past Surgical History:  Procedure Laterality Date  . COLON SURGERY      Social History   Social History  . Marital status: Married    Spouse name: N/A  . Number of children: 7  . Years of education: N/A   Occupational History  . retired    Social History Main Topics  . Smoking status: Former Smoker    Packs/day: 1.50    Years: 50.00    Types: Cigarettes    Quit date: 11/04/2010  . Smokeless tobacco: Never Used  . Alcohol use No  . Drug use: No  . Sexual activity: Not on file   Other Topics Concern  . Not on file   Social History Narrative  . No narrative on file    Current Outpatient Prescriptions on File Prior to Visit  Medication Sig Dispense Refill  . acetaminophen (TYLENOL) 500 MG tablet Take 500 mg by mouth every 8 (eight) hours as needed for mild pain.     . furosemide (LASIX) 20 MG tablet Take 1 tablet (20 mg total) by mouth every other day as needed for fluid or edema. 30 tablet 0  . lisinopril (PRINIVIL) 10  MG tablet Take 1 tablet (10 mg total) by mouth daily. 30 tablet 0  . Tiotropium Bromide-Olodaterol (STIOLTO RESPIMAT) 2.5-2.5 MCG/ACT AERS Inhale 2 puffs into the lungs daily. 4 g 5   No current facility-administered medications on file prior to visit.     No Known Allergies  Family History  Problem Relation Age of Onset  . Diabetes Mother   . Hypertension Mother   . Peripheral vascular disease Mother     amputation  . Diabetes Father     Right Leg Amputation-Gangrene  . Hypertension Father   . Peripheral vascular disease Father     amputation  . Cancer Father     Lead Poison-Ca  . Pneumonia Father   . Diabetes Sister   . Hypertension Sister   . Diabetes Daughter   . Hypertension Daughter     BP 136/88   Pulse 83   Ht 5\' 5"  (1.651 m)   Wt 189 lb (85.7 kg)   SpO2 94% Comment: On 2.5 L Of O2  BMI 31.45 kg/m    Review of Systems denies weight loss, headache, chest pain, n/v, muscle cramps, excessive diaphoresis, memory loss, depression, rhinorrhea, and  easy bruising.  He has chronic blurry vision, polyuria, cold intolerance. and doe.     Objective:   Physical Exam VS: see vs page GEN: no distress.  Has 02 on.  HEAD: head: no deformity eyes: no periorbital swelling, no proptosis external nose and ears are normal mouth: no lesion seen NECK: supple, thyroid is not enlarged CHEST WALL: no deformity LUNGS: clear to auscultation CV: reg rate and rhythm, no murmur ABD: abdomen is soft, nontender.  no hepatosplenomegaly.  not distended.  no hernia.  MUSCULOSKELETAL: muscle bulk and strength are grossly normal.  no obvious joint swelling.  gait is normal and steady.  EXTEMITIES: no deformity.  no ulcer on the feet.  feet are of normal temp, but are hyperpigmented.  1+ bilat edema.  There is bilateral onychomycosis of the toenails.  PULSES: dorsalis pedis absent bilaterally.  no carotid bruit NEURO:  cn 2-12 grossly intact.   readily moves all 4's.  sensation is intact to  touch on the feet SKIN:  Normal texture and temperature.  No rash or suspicious lesion is visible.   NODES:  None palpable at the neck PSYCH: alert, well-oriented.  Does not appear anxious nor depressed.    Lab Results  Component Value Date   HGBA1C 15.4 (H) 03/31/2016   Lab Results  Component Value Date   CREATININE 1.16 04/01/2016   BUN 27 (H) 04/01/2016   NA 138 04/01/2016   K 4.3 04/01/2016   CL 104 04/01/2016   CO2 26 04/01/2016   I have reviewed outside records, and summarized: Pt was noted to have severely elevated a1c, and referred here.  He was also rx'ed for balanitis.  He was not taking lipitor as rx'ed.    i personally reviewed electrocardiogram tracing (03/31/16): Indication: hypotension Impression: ST    Assessment & Plan:  Insulin-requiring type 2 DM, with renal insufficiency: severe exacerbation.   Noncompliance with insulin: this contributes to severe hyperglycemia.  He is not currently a candidate for multiple daily injections.  This may be due to the cost of insulin.   Patient is advised the following: Patient Instructions  good diet and exercise significantly improve the control of your diabetes.  please let me know if you wish to be referred to a dietician.  high blood sugar is very risky to your health.  you should see an eye doctor and dentist every year.  It is very important to get all recommended vaccinations.  Controlling your blood pressure and cholesterol drastically reduces the damage diabetes does to your body.  Those who smoke should quit.  Please discuss these with your doctor.   check your blood sugar twice a day.  vary the time of day when you check, between before the 3 meals, and at bedtime.  also check if you have symptoms of your blood sugar being too high or too low.  please keep a record of the readings and bring it to your next appointment here (or you can bring the meter itself).  You can write it on any piece of paper.  please call us  sooner if your blood sugar goes below 70, or if you have a lot of readings over 200. For now, please: change levemir to NPH, 30 units each morning.  I have sent a prescription to your pharmacy.   To help you remember, put the insulin pen next to something you use each morning, such as your toothbrush.   Please come back for a follow-up appointment in 1  month.

## 2016-04-29 ENCOUNTER — Encounter: Payer: Self-pay | Admitting: Endocrinology

## 2016-04-29 ENCOUNTER — Ambulatory Visit (INDEPENDENT_AMBULATORY_CARE_PROVIDER_SITE_OTHER): Payer: Medicare Other | Admitting: Endocrinology

## 2016-04-29 DIAGNOSIS — Z794 Long term (current) use of insulin: Secondary | ICD-10-CM

## 2016-04-29 DIAGNOSIS — E1122 Type 2 diabetes mellitus with diabetic chronic kidney disease: Secondary | ICD-10-CM

## 2016-04-29 DIAGNOSIS — N182 Chronic kidney disease, stage 2 (mild): Secondary | ICD-10-CM | POA: Diagnosis not present

## 2016-04-29 MED ORDER — INSULIN NPH (HUMAN) (ISOPHANE) 100 UNIT/ML ~~LOC~~ SUSP: 30.0000 [IU] | SUBCUTANEOUS | 11 refills | Status: DC

## 2016-04-29 NOTE — Patient Instructions (Addendum)
good diet and exercise significantly improve the control of your diabetes.  please let me know if you wish to be referred to a dietician.  high blood sugar is very risky to your health.  you should see an eye doctor and dentist every year.  It is very important to get all recommended vaccinations.  Controlling your blood pressure and cholesterol drastically reduces the damage diabetes does to your body.  Those who smoke should quit.  Please discuss these with your doctor.   check your blood sugar twice a day.  vary the time of day when you check, between before the 3 meals, and at bedtime.  also check if you have symptoms of your blood sugar being too high or too low.  please keep a record of the readings and bring it to your next appointment here (or you can bring the meter itself).  You can write it on any piece of paper.  please call us sooner if your blood sugar goes below 70, or if you have a lot of readings over 200. For now, please: change levemir to NPH, 30 units each morning.  I have sent a prescription to your pharmacy.   To help you remember, put the insulin pen next to something you use each morning, such as your toothbrush.   Please come back for a follow-up appointment in 1 month.

## 2016-05-02 DIAGNOSIS — E119 Type 2 diabetes mellitus without complications: Secondary | ICD-10-CM | POA: Insufficient documentation

## 2016-05-02 HISTORY — DX: Type 2 diabetes mellitus without complications: E11.9

## 2016-05-13 ENCOUNTER — Telehealth: Payer: Self-pay | Admitting: Endocrinology

## 2016-05-13 MED ORDER — GLUCOSE BLOOD VI STRP: ORAL_STRIP | 2 refills | Status: DC

## 2016-05-13 NOTE — Telephone Encounter (Signed)
Refill submitted per patient's request.  

## 2016-05-13 NOTE — Telephone Encounter (Signed)
Pt's wife called in and requests a prescription for his test strips to be sent to the pharmacy for testing 2x daily.  Contour Test Strip  [facility] [address]

## 2016-06-01 NOTE — Progress Notes (Signed)
Subjective:    Patient ID: Ivan Parks, male    DOB: 1940/08/25, 76 y.o.   MRN: [id number]  HPI Pt returns for f/u of diabetes mellitus: DM type: Insulin-requiring type 2.  Dx'ed: 2010 Complications: polyneuropathy, PAD, and renal insufficiency Therapy: insulin since 2016 GDM: never DKA: never Severe hypoglycemia: never Pancreatitis: never Other: he takes QD insulin, due to noncompliance; he takes human insulin, due to cost.  Interval history: he says he never misses the insulin.  no cbg record, but states cbg's are persistently over 300.  He wants to use up the levemir first.   Past Medical History:  Diagnosis Date  . Acute respiratory failure (HCC)   . Bilateral lower extremity edema   . Bronchitis   . CHF (congestive heart failure) (HCC)   . COPD (chronic obstructive pulmonary disease) (HCC)   . DM (diabetes mellitus) (HCC)   . HTN (hypertension)   . Hypoxemia   . OSA (obstructive sleep apnea)   . Peripheral vascular disease (HCC)   . Tobacco abuse     Past Surgical History:  Procedure Laterality Date  . COLON SURGERY      Social History   Social History  . Marital status: Married    Spouse name: N/A  . Number of children: 7  . Years of education: N/A   Occupational History  . retired    Social History Main Topics  . Smoking status: Former Smoker    Packs/day: 1.50    Years: 50.00    Types: Cigarettes    Quit date: 11/04/2010  . Smokeless tobacco: Never Used  . Alcohol use No  . Drug use: No  . Sexual activity: Not on file   Other Topics Concern  . Not on file   Social History Narrative  . No narrative on file    Current Outpatient Prescriptions on File Prior to Visit  Medication Sig Dispense Refill  . acetaminophen (TYLENOL) 500 MG tablet Take 500 mg by mouth every 8 (eight) hours as needed for mild pain.     . furosemide (LASIX) 20 MG tablet Take 1 tablet (20 mg total) by mouth every other day as needed for fluid or edema. 30 tablet 0  .  glucose blood (BAYER CONTOUR NEXT TEST) test strip Use to check blood sugar 2 times per day. 100 each 2  . lisinopril (PRINIVIL) 10 MG tablet Take 1 tablet (10 mg total) by mouth daily. 30 tablet 0  . Tiotropium Bromide-Olodaterol (STIOLTO RESPIMAT) 2.5-2.5 MCG/ACT AERS Inhale 2 puffs into the lungs daily. 4 g 5  . traMADol (ULTRAM) 50 MG tablet Take by mouth every 6 (six) hours as needed.     No current facility-administered medications on file prior to visit.     No Known Allergies  Family History  Problem Relation Age of Onset  . Diabetes Mother   . Hypertension Mother   . Peripheral vascular disease Mother     amputation  . Diabetes Father     Right Leg Amputation-Gangrene  . Hypertension Father   . Peripheral vascular disease Father     amputation  . Cancer Father     Lead Poison-Ca  . Pneumonia Father   . Diabetes Sister   . Hypertension Sister   . Diabetes Daughter   . Hypertension Daughter     BP 132/84   Pulse (!) 102   Ht 5\' 5"  (1.651 m)   Wt 190 lb (86.2 kg)   SpO2 92% Comment: On  2 Liters of O2  BMI 31.62 kg/m   Review of Systems He denies hypoglycemia.      Objective:   Physical Exam VITAL SIGNS:  See vs page GENERAL: no distress.  Has 02 on.   Pulses: dorsalis pedis intact bilat.   MSK: no deformity of the feet.   CV: 2+ right leg edema, and 1+ on the left.   Skin:  no ulcer on the feet.  feet are of normal temp, but are hyperpigmented.   Neuro: sensation is intact to touch on the feet, but decreased from normal.   A1c=14.7%    Assessment & Plan:  Insulin-requiring type 2 DM: he needs increased rx.   Patient is advised the following: Patient Instructions  check your blood sugar twice a day.  vary the time of day when you check, between before the 3 meals, and at bedtime.  also check if you have symptoms of your blood sugar being too high or too low.  please keep a record of the readings and bring it to your next appointment here (or you can  bring the meter itself).  You can write it on any piece of paper.  please call us sooner if your blood sugar goes below 70, or if you have a lot of readings over 200. please increase the insulin to 50 units each morning.  It is OK to use up the levemir first.   Please call us next week, to tell us how the blood sugar is doing.   Please come back for a follow-up appointment in 2 weeks.

## 2016-06-02 ENCOUNTER — Ambulatory Visit (INDEPENDENT_AMBULATORY_CARE_PROVIDER_SITE_OTHER): Payer: Medicare Other | Admitting: Endocrinology

## 2016-06-02 ENCOUNTER — Encounter: Payer: Self-pay | Admitting: Endocrinology

## 2016-06-02 VITALS — BP 132/84 | HR 102 | Ht 65.0 in | Wt 190.0 lb

## 2016-06-02 DIAGNOSIS — Z794 Long term (current) use of insulin: Secondary | ICD-10-CM

## 2016-06-02 DIAGNOSIS — E1122 Type 2 diabetes mellitus with diabetic chronic kidney disease: Secondary | ICD-10-CM

## 2016-06-02 DIAGNOSIS — N182 Chronic kidney disease, stage 2 (mild): Secondary | ICD-10-CM | POA: Diagnosis not present

## 2016-06-02 LAB — POCT GLYCOSYLATED HEMOGLOBIN (HGB A1C): Hemoglobin A1C: 14.7

## 2016-06-02 MED ORDER — INSULIN NPH (HUMAN) (ISOPHANE) 100 UNIT/ML ~~LOC~~ SUSP: 50.0000 [IU] | SUBCUTANEOUS | 11 refills | Status: DC

## 2016-06-02 NOTE — Patient Instructions (Addendum)
check your blood sugar twice a day.  vary the time of day when you check, between before the 3 meals, and at bedtime.  also check if you have symptoms of your blood sugar being too high or too low.  please keep a record of the readings and bring it to your next appointment here (or you can bring the meter itself).  You can write it on any piece of paper.  please call us sooner if your blood sugar goes below 70, or if you have a lot of readings over 200.  please increase the insulin to 50 units each morning.  It is OK to use up the levemir first.   Please call us next week, to tell us how the blood sugar is doing.  Please come back for a follow-up appointment in 2 weeks.

## 2016-06-17 ENCOUNTER — Ambulatory Visit (INDEPENDENT_AMBULATORY_CARE_PROVIDER_SITE_OTHER): Payer: Medicare Other | Admitting: Endocrinology

## 2016-06-17 VITALS — BP 136/87 | HR 100 | Ht 65.0 in | Wt 191.0 lb

## 2016-06-17 DIAGNOSIS — E1122 Type 2 diabetes mellitus with diabetic chronic kidney disease: Secondary | ICD-10-CM | POA: Diagnosis not present

## 2016-06-17 DIAGNOSIS — Z794 Long term (current) use of insulin: Secondary | ICD-10-CM

## 2016-06-17 DIAGNOSIS — N182 Chronic kidney disease, stage 2 (mild): Secondary | ICD-10-CM

## 2016-06-17 MED ORDER — GLUCOSE BLOOD VI STRP: ORAL_STRIP | 2 refills | Status: DC

## 2016-06-17 MED ORDER — INSULIN NPH (HUMAN) (ISOPHANE) 100 UNIT/ML ~~LOC~~ SUSP: 70.0000 [IU] | SUBCUTANEOUS | 11 refills | Status: DC

## 2016-06-17 NOTE — Progress Notes (Signed)
Subjective:     Patient ID: Ivan Parks, male   DOB: 1941/03/23, 76 y.o.   MRN: [id number]  HPI   Review of Systems     Objective:   Physical Exam     Assessment:         Plan:

## 2016-06-17 NOTE — Patient Instructions (Addendum)
check your blood sugar twice a day.  vary the time of day when you check, between before the 3 meals, and at bedtime.  also check if you have symptoms of your blood sugar being too high or too low.  please keep a record of the readings and bring it to your next appointment here (or you can bring the meter itself).  You can write it on any piece of paper.  please call us sooner if your blood sugar goes below 70, or if you have a lot of readings over 200.  please increase the insulin to 70 units each morning.  It is OK to use up the levemir first.   Please come back for a follow-up appointment in 3 weeks.  By then, you will be on the new NPH insulin.

## 2016-06-17 NOTE — Progress Notes (Signed)
Subjective:    Patient ID: Ivan Parks, male    DOB: August 03, 1940, 76 y.o.   MRN: [id number]  HPI Pt returns for f/u of diabetes mellitus: DM type: Insulin-requiring type 2.  Dx'ed: 2010 Complications: polyneuropathy, PAD, and renal insufficiency Therapy: insulin since 2016 GDM: never DKA: never Severe hypoglycemia: never Pancreatitis: never Other: he takes QD insulin, due to noncompliance; he takes human insulin, due to cost.  Interval history: he says he never misses the insulin.  no cbg record, but states cbg's are persistently over 300.  He is still using up the levemir.  Meter is downloaded today, and the printout is scanned into the record.  It varies from 250-500.  There is no trend throughout the day.  He has 2 more pens.  Past Medical History:  Diagnosis Date  . Acute respiratory failure (HCC)   . Bilateral lower extremity edema   . Bronchitis   . CHF (congestive heart failure) (HCC)   . COPD (chronic obstructive pulmonary disease) (HCC)   . DM (diabetes mellitus) (HCC)   . HTN (hypertension)   . Hypoxemia   . OSA (obstructive sleep apnea)   . Peripheral vascular disease (HCC)   . Tobacco abuse     Past Surgical History:  Procedure Laterality Date  . COLON SURGERY      Social History   Social History  . Marital status: Married    Spouse name: N/A  . Number of children: 7  . Years of education: N/A   Occupational History  . retired    Social History Main Topics  . Smoking status: Former Smoker    Packs/day: 1.50    Years: 50.00    Types: Cigarettes    Quit date: 11/04/2010  . Smokeless tobacco: Never Used  . Alcohol use No  . Drug use: No  . Sexual activity: Not on file   Other Topics Concern  . Not on file   Social History Narrative  . No narrative on file    Current Outpatient Prescriptions on File Prior to Visit  Medication Sig Dispense Refill  . acetaminophen (TYLENOL) 500 MG tablet Take 500 mg by mouth every 8 (eight) hours as needed  for mild pain.     . furosemide (LASIX) 20 MG tablet Take 1 tablet (20 mg total) by mouth every other day as needed for fluid or edema. 30 tablet 0  . lisinopril (PRINIVIL) 10 MG tablet Take 1 tablet (10 mg total) by mouth daily. 30 tablet 0  . Tiotropium Bromide-Olodaterol (STIOLTO RESPIMAT) 2.5-2.5 MCG/ACT AERS Inhale 2 puffs into the lungs daily. 4 g 5   No current facility-administered medications on file prior to visit.     No Known Allergies    BP 136/87   Pulse 100   Ht 5\' 5"  (1.651 m)   Wt 191 lb (86.6 kg)   SpO2 92% Comment: 2.5 L of O2  BMI 31.78 kg/m    Review of Systems He denies hypoglycemia.      Objective:   Physical Exam VITAL SIGNS:  See vs page GENERAL: no distress.  Has 02 on.   Pulses: dorsalis pedis intact bilat.   MSK: no deformity of the feet.   CV: 2+ right leg edema, and 1+ on the left.   Skin:  no ulcer on the feet.  feet are of normal temp, but are hyperpigmented.   Neuro: sensation is intact to touch on the feet, but decreased from normal.   Lab Results  Component Value Date   CREATININE 1.16 04/01/2016   BUN 27 (H) 04/01/2016   NA 138 04/01/2016   K 4.3 04/01/2016   CL 104 04/01/2016   CO2 26 04/01/2016      Assessment & Plan:  Insulin-requiring type 2 DM, with PAD: he needs increased rx  Patient is advised the following: Patient Instructions  check your blood sugar twice a day.  vary the time of day when you check, between before the 3 meals, and at bedtime.  also check if you have symptoms of your blood sugar being too high or too low.  please keep a record of the readings and bring it to your next appointment here (or you can bring the meter itself).  You can write it on any piece of paper.  please call us sooner if your blood sugar goes below 70, or if you have a lot of readings over 200.  please increase the insulin to 70 units each morning.  It is OK to use up the levemir first.   Please come back for a follow-up appointment in 3  weeks.  By then, you will be on the new NPH insulin.

## 2016-06-24 ENCOUNTER — Other Ambulatory Visit: Payer: Self-pay | Admitting: *Deleted

## 2016-06-24 MED ORDER — TIOTROPIUM BROMIDE-OLODATEROL 2.5-2.5 MCG/ACT IN AERS: 2.0000 | INHALATION_SPRAY | Freq: Every day | RESPIRATORY_TRACT | 1 refills | Status: DC

## 2016-06-29 ENCOUNTER — Telehealth: Payer: Self-pay | Admitting: Endocrinology

## 2016-06-29 MED ORDER — GLUCOSE BLOOD VI STRP: ORAL_STRIP | 2 refills | Status: DC

## 2016-06-29 MED ORDER — ONETOUCH DELICA LANCETS 33G MISC: 2 refills | Status: DC

## 2016-06-29 MED ORDER — ONETOUCH VERIO FLEX SYSTEM W/DEVICE KIT: 2.0000 | PACK | Freq: Two times a day (BID) | 2 refills | Status: DC

## 2016-06-29 NOTE — Telephone Encounter (Signed)
Refills submitted.  

## 2016-06-29 NOTE — Telephone Encounter (Signed)
Pt is in need of the diabetic supplies called into [facility]  Let the pt know that the test strips have been called into [facility], pt wife states that is fine-no need to call in the supplies now

## 2016-06-29 NOTE — Telephone Encounter (Signed)
Please call in the [facility] for a new meter and all supplies to [facility]

## 2016-07-08 ENCOUNTER — Encounter: Payer: Self-pay | Admitting: Endocrinology

## 2016-07-08 ENCOUNTER — Ambulatory Visit (INDEPENDENT_AMBULATORY_CARE_PROVIDER_SITE_OTHER): Payer: Medicare Other | Admitting: Endocrinology

## 2016-07-08 VITALS — BP 132/84 | HR 102 | Ht 65.0 in | Wt 192.0 lb

## 2016-07-08 DIAGNOSIS — Z794 Long term (current) use of insulin: Secondary | ICD-10-CM | POA: Diagnosis not present

## 2016-07-08 DIAGNOSIS — N182 Chronic kidney disease, stage 2 (mild): Secondary | ICD-10-CM

## 2016-07-08 DIAGNOSIS — E1122 Type 2 diabetes mellitus with diabetic chronic kidney disease: Secondary | ICD-10-CM | POA: Diagnosis not present

## 2016-07-08 MED ORDER — INSULIN NPH (HUMAN) (ISOPHANE) 100 UNIT/ML ~~LOC~~ SUSP: 100.0000 [IU] | SUBCUTANEOUS | 11 refills | Status: DC

## 2016-07-08 NOTE — Patient Instructions (Addendum)
check your blood sugar twice a day.  vary the time of day when you check, between before the 3 meals, and at bedtime.  also check if you have symptoms of your blood sugar being too high or too low.  please keep a record of the readings and bring it to your next appointment here (or you can bring the meter itself).  You can write it on any piece of paper.  please call us sooner if your blood sugar goes below 70, or if you have a lot of readings over 200.   please increase the insulin to 100 units each morning.  It is OK to use up the lantus first.   Please come back for a follow-up appointment in 2 weeks.  By then, you will be on the new NPH insulin.

## 2016-07-08 NOTE — Progress Notes (Signed)
Subjective:    Patient ID: Ivan Parks, male    DOB: [birth year], 76 y.o.   MRN: [id number]  HPI Pt returns for f/u of diabetes mellitus: DM type: Insulin-requiring type 2.  Dx'ed: 3007 Complications: polyneuropathy, PAD, and renal insufficiency Therapy: insulin since 2016 GDM: never DKA: never Severe hypoglycemia: never Pancreatitis: never Other: he takes QD insulin, due to noncompliance; he takes human insulin, due to cost.  Interval history: he says he never misses the insulin.  He says he is now using up the lantus.  Meter is downloaded today, and the printout is scanned into the record.  It varies from 200-300.  There is no trend throughout the day.  He has a few more pens, which he wants to use up Past Medical History:  Diagnosis Date  . Acute respiratory failure ([place])   . Bilateral lower extremity edema   . Bronchitis   . CHF (congestive heart failure) ([place])   . COPD (chronic obstructive pulmonary disease) ([place])   . DM (diabetes mellitus) ([place])   . HTN (hypertension)   . Hypoxemia   . OSA (obstructive sleep apnea)   . Peripheral vascular disease ([place])   . Tobacco abuse     Past Surgical History:  Procedure Laterality Date  . COLON SURGERY      Social History   Social History  . Marital status: Married    Spouse name: N/A  . Number of children: 7  . Years of education: N/A   Occupational History  . retired    Social History Main Topics  . Smoking status: Former Smoker    Packs/day: 1.50    Years: 50.00    Types: Cigarettes    Quit date: 11/04/2010  . Smokeless tobacco: Never Used  . Alcohol use No  . Drug use: No  . Sexual activity: Not on file   Other Topics Concern  . Not on file   Social History Narrative  . No narrative on file    Current Outpatient Prescriptions on File Prior to Visit  Medication Sig Dispense Refill  . acetaminophen (TYLENOL) 500 MG tablet Take 500 mg by mouth every 8 (eight) hours as needed for mild pain.     . Blood  Glucose Monitoring Suppl (ONETOUCH VERIO FLEX SYSTEM) w/Device KIT 2 each by Does not apply route 2 (two) times daily. 1 kit 2  . furosemide (LASIX) 20 MG tablet Take 1 tablet (20 mg total) by mouth every other day as needed for fluid or edema. 30 tablet 0  . glucose blood (ONETOUCH VERIO) test strip Use to check blood sugar 2 times per day. 200 each 2  . lisinopril (PRINIVIL) 10 MG tablet Take 1 tablet (10 mg total) by mouth daily. 30 tablet 0  . ONETOUCH DELICA LANCETS 62U MISC Use to check blood sugar 2 times per day. 200 each 2  . Tiotropium Bromide-Olodaterol (STIOLTO RESPIMAT) 2.5-2.5 MCG/ACT AERS Inhale 2 puffs into the lungs daily. 3 Inhaler 1   No current facility-administered medications on file prior to visit.     No Known Allergies  Family History  Problem Relation Age of Onset  . Diabetes Mother   . Hypertension Mother   . Peripheral vascular disease Mother     amputation  . Diabetes Father     Right Leg Amputation-Gangrene  . Hypertension Father   . Peripheral vascular disease Father     amputation  . Cancer Father     Lead Poison-Ca  . Pneumonia  Father   . Diabetes Sister   . Hypertension Sister   . Diabetes Daughter   . Hypertension Daughter     BP 132/84   Pulse (!) 102   Ht '5\' 5"'  (1.651 m)   Wt 192 lb (87.1 kg)   SpO2 92% Comment: On 3 L of 02  BMI 31.95 kg/m    Review of Systems He denies hypoglycemia.     Objective:   Physical Exam VITAL SIGNS:  See vs page GENERAL: no distress Pulses: dorsalis pedis intact bilat.   MSK: no deformity of the feet CV: no leg edema Skin:  no ulcer on the feet.  normal color and temp on the feet. Neuro: sensation is intact to touch on the feet.        Assessment & Plan:  Insulin-requiring type 2 DM, with renal insufficiency: he needs increased rx.  Patient is advised the following: Patient Instructions  check your blood sugar twice a day.  vary the time of day when you check, between before the 3 meals,  and at bedtime.  also check if you have symptoms of your blood sugar being too high or too low.  please keep a record of the readings and bring it to your next appointment here (or you can bring the meter itself).  You can write it on any piece of paper.  please call us sooner if your blood sugar goes below 70, or if you have a lot of readings over 200.   please increase the insulin to 100 units each morning.  It is OK to use up the lantus first.   Please come back for a follow-up appointment in 2 weeks.  By then, you will be on the new NPH insulin.

## 2016-07-20 ENCOUNTER — Other Ambulatory Visit: Payer: Self-pay

## 2016-07-20 MED ORDER — INSULIN NPH (HUMAN) (ISOPHANE) 100 UNIT/ML ~~LOC~~ SUSP: SUBCUTANEOUS | 4 refills | Status: DC

## 2016-07-21 ENCOUNTER — Other Ambulatory Visit: Payer: Self-pay

## 2016-07-21 MED ORDER — INSULIN NPH (HUMAN) (ISOPHANE) 100 UNIT/ML ~~LOC~~ SUSP: SUBCUTANEOUS | 5 refills | Status: DC

## 2016-07-23 ENCOUNTER — Telehealth: Payer: Self-pay | Admitting: Endocrinology

## 2016-07-23 NOTE — Telephone Encounter (Signed)
Patient daughter is having some confusion with patient dosage on  Insulin  insulin NPH Human (HUMULIN N) 100 UNIT/ML injection 30 mL

## 2016-07-23 NOTE — Telephone Encounter (Signed)
Requested a call back from the patient to further discuss.  

## 2016-07-27 ENCOUNTER — Ambulatory Visit: Payer: Medicare Other | Admitting: Endocrinology

## 2016-07-27 ENCOUNTER — Encounter: Payer: Self-pay | Admitting: Endocrinology

## 2016-07-27 ENCOUNTER — Ambulatory Visit (INDEPENDENT_AMBULATORY_CARE_PROVIDER_SITE_OTHER): Payer: Medicare Other | Admitting: Endocrinology

## 2016-07-27 VITALS — BP 134/86 | HR 99 | Ht 65.0 in | Wt 189.0 lb

## 2016-07-27 DIAGNOSIS — E1122 Type 2 diabetes mellitus with diabetic chronic kidney disease: Secondary | ICD-10-CM

## 2016-07-27 DIAGNOSIS — Z794 Long term (current) use of insulin: Secondary | ICD-10-CM | POA: Diagnosis not present

## 2016-07-27 DIAGNOSIS — N182 Chronic kidney disease, stage 2 (mild): Secondary | ICD-10-CM | POA: Diagnosis not present

## 2016-07-27 MED ORDER — INSULIN NPH (HUMAN) (ISOPHANE) 100 UNIT/ML ~~LOC~~ SUSP: 40.0000 [IU] | SUBCUTANEOUS | 5 refills | Status: DC

## 2016-07-27 NOTE — Patient Instructions (Addendum)
check your blood sugar twice a day.  vary the time of day when you check, between before the 3 meals, and at bedtime.  also check if you have symptoms of your blood sugar being too high or too low.  please keep a record of the readings and bring it to your next appointment here (or you can bring the meter itself).  You can write it on any piece of paper.  please call us sooner if your blood sugar goes below 70, or if you have a lot of readings over 200.   please discard the lantus. Then start the new NPH insulin, 40 units each morning.   Please call us in a few days, to tell us how the blood sugar is doing. Please come back for a follow-up appointment in 2 weeks.

## 2016-07-27 NOTE — Progress Notes (Signed)
Subjective:    Patient ID: Ivan Parks, male    DOB: 06-03-1940, 76 y.o.   MRN: [id number]  HPI Pt returns for f/u of diabetes mellitus: DM type: Insulin-requiring type 2.  Dx'ed: 0254 Complications: polyneuropathy, PAD, and renal insufficiency.   Therapy: insulin since 2016 GDM: never DKA: never Severe hypoglycemia: never Pancreatitis: never Other: he takes QD insulin, due to noncompliance; he takes human insulin, due to cost.  Interval history: he says he never misses the insulin.  no cbg record, but states cbg's are still approx 300.  There is no trend throughout the day.  He still has a few more pens, which he wants to use up.  He says 1 pen lasts 1 month.   Past Medical History:  Diagnosis Date  . Acute respiratory failure ([place])   . Bilateral lower extremity edema   . Bronchitis   . CHF (congestive heart failure) ([place])   . COPD (chronic obstructive pulmonary disease) ([place])   . DM (diabetes mellitus) ([place])   . HTN (hypertension)   . Hypoxemia   . OSA (obstructive sleep apnea)   . Peripheral vascular disease ([place])   . Tobacco abuse     Past Surgical History:  Procedure Laterality Date  . COLON SURGERY      Social History   Social History  . Marital status: Married    Spouse name: N/A  . Number of children: 7  . Years of education: N/A   Occupational History  . retired    Social History Main Topics  . Smoking status: Former Smoker    Packs/day: 1.50    Years: 50.00    Types: Cigarettes    Quit date: 11/04/2010  . Smokeless tobacco: Never Used  . Alcohol use No  . Drug use: No  . Sexual activity: Not on file   Other Topics Concern  . Not on file   Social History Narrative  . No narrative on file    Current Outpatient Prescriptions on File Prior to Visit  Medication Sig Dispense Refill  . acetaminophen (TYLENOL) 500 MG tablet Take 500 mg by mouth every 8 (eight) hours as needed for mild pain.     . Blood Glucose Monitoring Suppl (ONETOUCH VERIO  FLEX SYSTEM) w/Device KIT 2 each by Does not apply route 2 (two) times daily. 1 kit 2  . furosemide (LASIX) 20 MG tablet Take 1 tablet (20 mg total) by mouth every other day as needed for fluid or edema. 30 tablet 0  . glucose blood (ONETOUCH VERIO) test strip Use to check blood sugar 2 times per day. 200 each 2  . lisinopril (PRINIVIL) 10 MG tablet Take 1 tablet (10 mg total) by mouth daily. 30 tablet 0  . ONETOUCH DELICA LANCETS 27C MISC Use to check blood sugar 2 times per day. 200 each 2  . Tiotropium Bromide-Olodaterol (STIOLTO RESPIMAT) 2.5-2.5 MCG/ACT AERS Inhale 2 puffs into the lungs daily. 3 Inhaler 1   No current facility-administered medications on file prior to visit.     No Known Allergies  Family History  Problem Relation Age of Onset  . Diabetes Mother   . Hypertension Mother   . Peripheral vascular disease Mother     amputation  . Diabetes Father     Right Leg Amputation-Gangrene  . Hypertension Father   . Peripheral vascular disease Father     amputation  . Cancer Father     Lead Poison-Ca  . Pneumonia Father   .  Diabetes Sister   . Hypertension Sister   . Diabetes Daughter   . Hypertension Daughter     BP 134/86   Pulse 99   Ht '5\' 5"'  (1.651 m)   Wt 189 lb (85.7 kg)   SpO2 90% Comment: 3 L of o2  BMI 31.45 kg/m    Review of Systems He denies hypoglycemia    Objective:   Physical Exam VITAL SIGNS:  See vs page GENERAL: no distress Pulses: dorsalis pedis intact bilat.   MSK: no deformity of the feet CV: 2+ right leg edema (1+ on the left).  Skin:  no ulcer on the feet.  normal temp on the feet.  There is patchy hyperpigmentation on the feet.   Neuro: sensation is intact to touch on the feet.       Assessment & Plan:  Insulin-requiring type 2 DM, with PAD. glycemic control is worse. Renal insuff; in this setting, he prob needs NPH insulin just QD.    Patient is advised the following: Patient Instructions  check your blood sugar twice a day.   vary the time of day when you check, between before the 3 meals, and at bedtime.  also check if you have symptoms of your blood sugar being too high or too low.  please keep a record of the readings and bring it to your next appointment here (or you can bring the meter itself).  You can write it on any piece of paper.  please call us sooner if your blood sugar goes below 70, or if you have a lot of readings over 200.   please discard the lantus. Then start the new NPH insulin, 40 units each morning.   Please call us in a few days, to tell us how the blood sugar is doing. Please come back for a follow-up appointment in 2 weeks.

## 2016-08-25 ENCOUNTER — Ambulatory Visit (INDEPENDENT_AMBULATORY_CARE_PROVIDER_SITE_OTHER): Payer: Medicare Other | Admitting: Endocrinology

## 2016-08-25 ENCOUNTER — Telehealth: Payer: Self-pay | Admitting: Emergency Medicine

## 2016-08-25 VITALS — BP 134/86 | HR 107 | Ht 65.0 in | Wt 201.0 lb

## 2016-08-25 DIAGNOSIS — E1122 Type 2 diabetes mellitus with diabetic chronic kidney disease: Secondary | ICD-10-CM | POA: Diagnosis not present

## 2016-08-25 DIAGNOSIS — N182 Chronic kidney disease, stage 2 (mild): Secondary | ICD-10-CM

## 2016-08-25 DIAGNOSIS — Z794 Long term (current) use of insulin: Secondary | ICD-10-CM

## 2016-08-25 LAB — GLUCOSE, POCT (MANUAL RESULT ENTRY): POC Glucose: 118 mg/dl — AB (ref 70–99)

## 2016-08-25 LAB — POCT GLYCOSYLATED HEMOGLOBIN (HGB A1C): HEMOGLOBIN A1C: 11.4

## 2016-08-25 MED ORDER — INSULIN NPH ISOPHANE & REGULAR (70-30) 100 UNIT/ML ~~LOC~~ SUSP: 40.0000 [IU] | Freq: Every day | SUBCUTANEOUS | 11 refills | Status: DC

## 2016-08-25 NOTE — Patient Instructions (Addendum)
check your blood sugar twice a day.  vary the time of day when you check, between before the 3 meals, and at bedtime.  also check if you have symptoms of your blood sugar being too high or too low.  please keep a record of the readings and bring it to your next appointment here (or you can bring the meter itself).  You can write it on any piece of paper.  please call us sooner if your blood sugar goes below 70, or if you have a lot of readings over 200.   please change the NPH insulin to "70/30," 40 units with breakfast.   Please come back for a follow-up appointment in 6 weeks.

## 2016-08-25 NOTE — Progress Notes (Signed)
Subjective:    Patient ID: Ivan Parks, male    DOB: October 20, 1940, 76 y.o.   MRN: [id number]  HPI Pt returns for f/u of diabetes mellitus: DM type: Insulin-requiring type 2.  Dx'ed: 0086 Complications: polyneuropathy, PAD, and renal insufficiency.   Therapy: insulin since 2016.   DKA: never Severe hypoglycemia: never.  Pancreatitis: never.   Other: he takes QD insulin, due to noncompliance; he takes human insulin, due to cost.   Interval history: he says he never misses the insulin.  He now takes NPH insulin, 40 units qam.  Meter is downloaded today, and the printout is scanned into the record.  It varies from 79-271.  It is in general higher as the day goes on.   Past Medical History:  Diagnosis Date  . Acute respiratory failure ([place])   . Bilateral lower extremity edema   . Bronchitis   . CHF (congestive heart failure) ([place])   . COPD (chronic obstructive pulmonary disease) ([place])   . DM (diabetes mellitus) ([place])   . HTN (hypertension)   . Hypoxemia   . OSA (obstructive sleep apnea)   . Peripheral vascular disease ([place])   . Tobacco abuse     Past Surgical History:  Procedure Laterality Date  . COLON SURGERY      Social History   Social History  . Marital status: Married    Spouse name: N/A  . Number of children: 7  . Years of education: N/A   Occupational History  . retired    Social History Main Topics  . Smoking status: Former Smoker    Packs/day: 1.50    Years: 50.00    Types: Cigarettes    Quit date: 11/04/2010  . Smokeless tobacco: Never Used  . Alcohol use No  . Drug use: No  . Sexual activity: Not on file   Other Topics Concern  . Not on file   Social History Narrative  . No narrative on file    Current Outpatient Prescriptions on File Prior to Visit  Medication Sig Dispense Refill  . acetaminophen (TYLENOL) 500 MG tablet Take 500 mg by mouth every 8 (eight) hours as needed for mild pain.     . Blood Glucose Monitoring Suppl (ONETOUCH VERIO  FLEX SYSTEM) w/Device KIT 2 each by Does not apply route 2 (two) times daily. 1 kit 2  . furosemide (LASIX) 20 MG tablet Take 1 tablet (20 mg total) by mouth every other day as needed for fluid or edema. 30 tablet 0  . glucose blood (ONETOUCH VERIO) test strip Use to check blood sugar 2 times per day. 200 each 2  . lisinopril (PRINIVIL) 10 MG tablet Take 1 tablet (10 mg total) by mouth daily. 30 tablet 0  . ONETOUCH DELICA LANCETS 76P MISC Use to check blood sugar 2 times per day. 200 each 2  . Tiotropium Bromide-Olodaterol (STIOLTO RESPIMAT) 2.5-2.5 MCG/ACT AERS Inhale 2 puffs into the lungs daily. 3 Inhaler 1   No current facility-administered medications on file prior to visit.     No Known Allergies  Family History  Problem Relation Age of Onset  . Diabetes Mother   . Hypertension Mother   . Peripheral vascular disease Mother     amputation  . Diabetes Father     Right Leg Amputation-Gangrene  . Hypertension Father   . Peripheral vascular disease Father     amputation  . Cancer Father     Lead Poison-Ca  . Pneumonia Father   .  Diabetes Sister   . Hypertension Sister   . Diabetes Daughter   . Hypertension Daughter     BP 134/86   Pulse (!) 107   Ht '5\' 5"'  (1.651 m)   Wt 201 lb (91.2 kg)   SpO2 97% Comment: On 3 L of O2  BMI 33.45 kg/m    Review of Systems He denies hypoglycemia.      Objective:   Physical Exam VITAL SIGNS:  See vs page GENERAL: no distress Pulses: dorsalis pedis intact bilat.   MSK: no deformity of the feet CV: 2+ bilat leg edema Skin:  no ulcer on the feet.  normal temp on the feet.  There is dense hyperpigmentation on the feet.   Neuro: sensation is intact to touch on the feet, but decreased from normal.       Assessment & Plan:  Insulin-requiring type 2 DM, with PAD: The pattern of his cbg's indicates he needs a faster-acting qd insulin Renal insufficiency: this is the likely reason for the long duration of action of  insulin.   Patient Instructions  check your blood sugar twice a day.  vary the time of day when you check, between before the 3 meals, and at bedtime.  also check if you have symptoms of your blood sugar being too high or too low.  please keep a record of the readings and bring it to your next appointment here (or you can bring the meter itself).  You can write it on any piece of paper.  please call us sooner if your blood sugar goes below 70, or if you have a lot of readings over 200.   please change the NPH insulin to "70/30," 40 units with breakfast.   Please come back for a follow-up appointment in 6 weeks.

## 2016-08-25 NOTE — Telephone Encounter (Signed)
Called and spoke to pt's wife. Pt is requesting a sample of Stiolto. This has been placed up front for pick up. She verbalized understanding and denied any further questions or concerns at this time.

## 2016-10-02 ENCOUNTER — Encounter: Payer: Self-pay | Admitting: Family

## 2016-10-06 ENCOUNTER — Encounter: Payer: Self-pay | Admitting: Endocrinology

## 2016-10-06 ENCOUNTER — Ambulatory Visit (INDEPENDENT_AMBULATORY_CARE_PROVIDER_SITE_OTHER): Payer: Medicare Other | Admitting: Endocrinology

## 2016-10-06 VITALS — BP 122/82 | HR 88 | Ht 65.0 in | Wt 194.0 lb

## 2016-10-06 DIAGNOSIS — E1122 Type 2 diabetes mellitus with diabetic chronic kidney disease: Secondary | ICD-10-CM | POA: Diagnosis not present

## 2016-10-06 DIAGNOSIS — Z794 Long term (current) use of insulin: Secondary | ICD-10-CM | POA: Diagnosis not present

## 2016-10-06 DIAGNOSIS — N182 Chronic kidney disease, stage 2 (mild): Secondary | ICD-10-CM

## 2016-10-06 MED ORDER — INSULIN NPH ISOPHANE & REGULAR (70-30) 100 UNIT/ML ~~LOC~~ SUSP: 50.0000 [IU] | Freq: Every day | SUBCUTANEOUS | 11 refills | Status: DC

## 2016-10-06 NOTE — Progress Notes (Signed)
Subjective:    Patient ID: Ivan Parks, male    DOB: Aug 30, 1940, 76 y.o.   MRN: [id number]  HPI Pt returns for f/u of diabetes mellitus: DM type: Insulin-requiring type 2.  Dx'ed: 7939 Complications: polyneuropathy, PAD, and renal insufficiency.   Therapy: insulin since 2016.   DKA: never Severe hypoglycemia: never.  Pancreatitis: never.   Other: he takes QD insulin, due to noncompliance; he takes human insulin, due to cost; he has long duration of action of insulin, prob due to renal insuff; NPH was changed to 70/30, due to AM hypoglycemia and PM hyperglycemia. Interval history: Meter is downloaded today, and the printout is scanned into the record.  It varies from 70-300, but wife says some of the cbg' s are prior to the 70/30 insulin.  He says he never misses the insulin.  Past Medical History:  Diagnosis Date  . Acute respiratory failure ([place])   . Bilateral lower extremity edema   . Bronchitis   . CHF (congestive heart failure) ([place])   . COPD (chronic obstructive pulmonary disease) ([place])   . DM (diabetes mellitus) ([place])   . HTN (hypertension)   . Hypoxemia   . OSA (obstructive sleep apnea)   . Peripheral vascular disease ([place])   . Tobacco abuse     Past Surgical History:  Procedure Laterality Date  . COLON SURGERY      Social History   Social History  . Marital status: Married    Spouse name: N/A  . Number of children: 7  . Years of education: N/A   Occupational History  . retired    Social History Main Topics  . Smoking status: Former Smoker    Packs/day: 1.50    Years: 50.00    Types: Cigarettes    Quit date: 11/04/2010  . Smokeless tobacco: Never Used  . Alcohol use No  . Drug use: No  . Sexual activity: Not on file   Other Topics Concern  . Not on file   Social History Narrative  . No narrative on file    Current Outpatient Prescriptions on File Prior to Visit  Medication Sig Dispense Refill  . acetaminophen (TYLENOL) 500 MG tablet Take  500 mg by mouth every 8 (eight) hours as needed for mild pain.     Marland Kitchen atorvastatin (LIPITOR) 20 MG tablet     . Blood Glucose Monitoring Suppl (ONETOUCH VERIO FLEX SYSTEM) w/Device KIT 2 each by Does not apply route 2 (two) times daily. 1 kit 2  . furosemide (LASIX) 20 MG tablet Take 1 tablet (20 mg total) by mouth every other day as needed for fluid or edema. 30 tablet 0  . glucose blood (ONETOUCH VERIO) test strip Use to check blood sugar 2 times per day. 200 each 2  . lisinopril (PRINIVIL) 10 MG tablet Take 1 tablet (10 mg total) by mouth daily. 30 tablet 0  . ONETOUCH DELICA LANCETS 03E MISC Use to check blood sugar 2 times per day. 200 each 2  . Tiotropium Bromide-Olodaterol (STIOLTO RESPIMAT) 2.5-2.5 MCG/ACT AERS Inhale 2 puffs into the lungs daily. 3 Inhaler 1   No current facility-administered medications on file prior to visit.     No Known Allergies  Family History  Problem Relation Age of Onset  . Diabetes Mother   . Hypertension Mother   . Peripheral vascular disease Mother        amputation  . Diabetes Father        Right Leg Amputation-Gangrene  .  Hypertension Father   . Peripheral vascular disease Father        amputation  . Cancer Father        Lead Poison-Ca  . Pneumonia Father   . Diabetes Sister   . Hypertension Sister   . Diabetes Daughter   . Hypertension Daughter     BP 122/82   Pulse 88   Ht _0  (1.651 m)   Wt 194 lb (88 kg)   SpO2 94% Comment: On 3 Liters of 02  BMI 32.28 kg/m   Review of Systems He denies hypoglycemia    Objective:   Physical Exam VITAL SIGNS:  See vs page GENERAL: no distress Pulses: dorsalis pedis are absent bilat, prob due to edema. MSK: no deformity of the feet CV: 2+ bilat leg edema Skin:  no ulcer on the feet.  normal temp on the feet.  There is dense hyperpigmentation on the feet and legs.   Neuro: sensation is intact to touch on the feet, but decreased from normal.       Assessment & Plan:  Insulin-requiring  type 2 DM, with renal insuff: he needs increased rx  Patient Instructions  check your blood sugar twice a day.  vary the time of day when you check, between before the 3 meals, and at bedtime.  also check if you have symptoms of your blood sugar being too high or too low.  please keep a record of the readings and bring it to your next appointment here (or you can bring the meter itself).  You can write it on any piece of paper.  please call us sooner if your blood sugar goes below 70, or if you have a lot of readings over 200.   please increase the insulin to 50 units with breakfast.   Please come back for a follow-up appointment in 2 months.

## 2016-10-06 NOTE — Patient Instructions (Addendum)
check your blood sugar twice a day.  vary the time of day when you check, between before the 3 meals, and at bedtime.  also check if you have symptoms of your blood sugar being too high or too low.  please keep a record of the readings and bring it to your next appointment here (or you can bring the meter itself).  You can write it on any piece of paper.  please call us sooner if your blood sugar goes below 70, or if you have a lot of readings over 200.   please increase the insulin to 50 units with breakfast.   Please come back for a follow-up appointment in 2 months.

## 2016-10-15 ENCOUNTER — Ambulatory Visit (HOSPITAL_COMMUNITY)
Admission: RE | Admit: 2016-10-15 | Discharge: 2016-10-15 | Disposition: A | Discharge status: Home / Self Care | Payer: Medicare Other | Source: Ambulatory Visit | Attending: Family | Admitting: Family

## 2016-10-15 ENCOUNTER — Encounter: Payer: Self-pay | Admitting: Family

## 2016-10-15 ENCOUNTER — Ambulatory Visit (INDEPENDENT_AMBULATORY_CARE_PROVIDER_SITE_OTHER): Payer: Medicare Other | Admitting: Family

## 2016-10-15 VITALS — BP 146/89 | HR 95 | Temp 97.0°F | Resp 18 | Ht 65.0 in | Wt 197.0 lb

## 2016-10-15 DIAGNOSIS — E1151 Type 2 diabetes mellitus with diabetic peripheral angiopathy without gangrene: Secondary | ICD-10-CM

## 2016-10-15 DIAGNOSIS — I872 Venous insufficiency (chronic) (peripheral): Secondary | ICD-10-CM

## 2016-10-15 DIAGNOSIS — IMO0002 Reserved for concepts with insufficient information to code with codable children: Secondary | ICD-10-CM

## 2016-10-15 DIAGNOSIS — E1165 Type 2 diabetes mellitus with hyperglycemia: Secondary | ICD-10-CM

## 2016-10-15 DIAGNOSIS — I739 Peripheral vascular disease, unspecified: Secondary | ICD-10-CM

## 2016-10-15 DIAGNOSIS — R9439 Abnormal result of other cardiovascular function study: Secondary | ICD-10-CM | POA: Diagnosis not present

## 2016-10-15 DIAGNOSIS — Z87891 Personal history of nicotine dependence: Secondary | ICD-10-CM

## 2016-10-15 NOTE — Progress Notes (Signed)
[facility] [place]   CC: Follow up peripheral artery occlusive disease  History of Present Illness Ivan Parks is a 76 y.o. male patient of Dr. Oneida Alar who presents today stating that he was referred here for swelling in his right leg, returns for scheduled follow up.   He states that he has had swelling in his right leg since he was a kid. He states that this does not give him any problems. He denies any claudication symptoms, but he does not seem to walk much, limited by right knee pain and possibly dyspnea. States that he was told he has some broken bones in his right foot and had a right knee injury in his younger years. He does have a hx of tobacco use, but quit in 2012.  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   He was seen by Dr. Lindon Romp for wound care of his right anterior lower leg, treated by his daughter, wounds healed, 2 visits to wound care and discharged as his wounds were healed. He has chronic venous insufficiency in his right lower leg, by history he does not seem to elevate his legs much, sleeps sitting up with his legs dependent.   Pt Diabetic: Yes, A1C on 08-25-16 was 11.4 (review of records), uncontrolled. He has been seeing an endocrinologist since 2017.  Pt smoker: former smoker, quit in 2012  Pt meds include: Statin :Yes ASA: No, states he has no allergy to ASA, denies bleeding problems, denies GI ulcers or bleeding Other anticoagulants/antiplatelets: no   Past Medical History:  Diagnosis Date  . Acute respiratory failure ([place])   . Bilateral lower extremity edema   . Bronchitis   . CHF (congestive heart failure) ([place])   . COPD (chronic obstructive pulmonary disease) ([place])   . DM (diabetes mellitus) ([place])   . HTN (hypertension)   . Hypoxemia   . OSA (obstructive sleep apnea)   . Peripheral vascular disease ([place])   . Tobacco abuse     Social History Social History  Substance Use Topics  . Smoking status:  Former Smoker    Packs/day: 1.50    Years: 50.00    Types: Cigarettes    Quit date: 11/04/2010  . Smokeless tobacco: Never Used  . Alcohol use No    Family History Family History  Problem Relation Age of Onset  . Diabetes Mother   . Hypertension Mother   . Peripheral vascular disease Mother        amputation  . Diabetes Father        Right Leg Amputation-Gangrene  . Hypertension Father   . Peripheral vascular disease Father        amputation  . Cancer Father        Lead Poison-Ca  . Pneumonia Father   . Diabetes Sister   . Hypertension Sister   . Diabetes Daughter   . Hypertension Daughter     Past Surgical History:  Procedure Laterality Date  . COLON SURGERY      No Known Allergies  Current Outpatient Prescriptions  Medication Sig Dispense Refill  . acetaminophen (TYLENOL) 500 MG tablet Take 500 mg by mouth every 8 (eight) hours as needed for mild pain.     Marland Kitchen atorvastatin (LIPITOR) 20 MG tablet     . Blood Glucose Monitoring Suppl (ONETOUCH VERIO FLEX SYSTEM) w/Device KIT 2 each by Does not apply route 2 (two) times daily. 1 kit 2  . glucose blood (ONETOUCH VERIO)  test strip Use to check blood sugar 2 times per day. 200 each 2  . insulin NPH-regular Human (NOVOLIN 70/30 RELION) (70-30) 100 UNIT/ML injection Inject 50 Units into the skin daily with breakfast. And syringes 1/day 20 mL 11  . lisinopril (PRINIVIL) 10 MG tablet Take 1 tablet (10 mg total) by mouth daily. 30 tablet 0  . ONETOUCH DELICA LANCETS 80H MISC Use to check blood sugar 2 times per day. 200 each 2  . Tiotropium Bromide-Olodaterol (STIOLTO RESPIMAT) 2.5-2.5 MCG/ACT AERS Inhale 2 puffs into the lungs daily. 3 Inhaler 1  . furosemide (LASIX) 20 MG tablet Take 1 tablet (20 mg total) by mouth every other day as needed for fluid or edema. (Patient not taking: Reported on 10/15/2016) 30 tablet 0   No current facility-administered medications for this visit.     ROS: See HPI for pertinent positives and  negatives.   Physical Examination  Vitals:   10/15/16 1346  BP: (!) 148/87  Pulse: 95  Resp: 18  Temp: 97 F (36.1 C)  TempSrc: Oral  SpO2: 94%  Weight: 197 lb (89.4 kg)  Height: '5\' 5"'  (1.651 m)   Body mass index is 32.78 kg/m.  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  Eyes: Pupils equal Pulmonary: Slightly-labored breathing, limited air movement in all fields, no rales, rhonchi,or wheezing; wearing nasal canula with supplemental O2. Cardiac: RRR, with no appreciable murmur; without carotid bruits Abdomen: soft, NT, no palpable masses, large panus Skin: without rashes, without ulcers, see Extremities.  Vascular Exam/Pulses:  Right Left  Femoral Not palpable (obese) Not palpable (obese)  DP Faintly palpable, biphasic by Doppler faintly palpable, triphasic by Doppler  PT Non palpable, biphasic by Doppler Not palpable, triphasic by Doppler   Extremities: without ischemic changes, without Gangrene , without cellulitis; without open wounds; 2+ pitting and non pitting edema RLE, 1+ in left LE. Leathery skin of both lower legs consistent with chronic venous insufficiency.  Musculoskeletal: no muscle wasting or atrophy Neurologic: A&O X 3; Appropriate Affect ; SENSATION: normal; MOTOR FUNCTION: moving all extremities equally. Speech is fluent/normal, some hearing loss .    Non-Invasive Vascular Imaging: DATE: 10/15/2016  ABI   R:   ABI: 0.89 (0.86, 10-16-15),   PT: bi  DP: bi  TBI:  0.53  L:   ABI: 1.09 (was 0.98),   PT: tri  DP: tri  TBI: 0.88    ASSESSMENT: Ivan Parks is a 76 y.o. male who presents with: -Mild PAD,pulses are not palpable, are Dopplerable. His walking is limited by right knee pain, see Plan. Slightly improved ABI's bilaterally.  -Chronic venous insufficiency of lower legs, see Plan  and pt instructions. -Uncontrolled DM is his major atherosclerotic risk factor. Other atherosclerotic risk factors for him are former smoker, COPD, obesity, and sedentary lifestyle.   PLAN:  Daily seated leg exercises discussed and demonstrated.  Based on the patient's vascular studies and examination, pt will return to clinic in 1 year with ABI's.  I discussed in depth with the patient the nature of atherosclerosis, and emphasized the importance of maximal medical management including strict control of blood pressure, blood glucose, and lipid levels, obtaining regular exercise, and continued cessation of smoking.  The patient is aware that without maximal medical management the underlying atherosclerotic disease process will progress, limiting the benefit of any interventions.  The patient was given information  about PAD including signs, symptoms, treatment, what symptoms should prompt the patient to seek immediate medical care, and risk reduction measures to take.  Clemon Chambers, RN, MSN, FNP-C [facility] Phone: [phone]  Clinic MD: Fields/Dickson  10/15/16 1:49 PM

## 2016-10-15 NOTE — Patient Instructions (Addendum)
Peripheral Vascular Disease Peripheral vascular disease (PVD) is a disease of the blood vessels that are not part of your heart and brain. A simple term for PVD is poor circulation. In most cases, PVD narrows the blood vessels that carry blood from your heart to the rest of your body. This can result in a decreased supply of blood to your arms, legs, and internal organs, like your stomach or kidneys. However, it most often affects a person's lower legs and feet. There are two types of PVD.  Organic PVD. This is the more common type. It is caused by damage to the structure of blood vessels.  Functional PVD. This is caused by conditions that make blood vessels contract and tighten (spasm).  Without treatment, PVD tends to get worse over time. PVD can also lead to acute ischemic limb. This is when an arm or limb suddenly has trouble getting enough blood. This is a medical emergency. Follow these instructions at home:  Take medicines only as told by your doctor.  Do not use any tobacco products, including cigarettes, chewing tobacco, or electronic cigarettes. If you need help quitting, ask your doctor.  Lose weight if you are overweight, and maintain a healthy weight as told by your doctor.  Eat a diet that is low in fat and cholesterol. If you need help, ask your doctor.  Exercise regularly. Ask your doctor for some good activities for you.  Take good care of your feet. ? Wear comfortable shoes that fit well. ? Check your feet often for any cuts or sores. Contact a doctor if:  You have cramps in your legs while walking.  You have leg pain when you are at rest.  You have coldness in a leg or foot.  Your skin changes.  You are unable to get or have an erection (erectile dysfunction).  You have cuts or sores on your feet that are not healing. Get help right away if:  Your arm or leg turns cold and blue.  Your arms or legs become red, warm, swollen, painful, or numb.  You have  chest pain or trouble breathing.  You suddenly have weakness in your face, arm, or leg.  You become very confused or you cannot speak.  You suddenly have a very bad headache.  You suddenly cannot see. This information is not intended to replace advice given to you by your health care provider. Make sure you discuss any questions you have with your health care provider. Document Released: 07/29/2009 Document Revised: 10/10/2015 Document Reviewed: 10/12/2013 Elsevier Interactive Patient Education  2017 Else[employer] Inc.      Chronic Venous Insufficiency Chronic venous insufficiency, also called venous stasis, is a condition that prevents blood from being pumped effectively through the veins in your legs. Blood may no longer be pumped effectively from the legs back to the heart. This condition can range from mild to severe. With proper treatment, you should be able to continue with an active life. What are the causes? Chronic venous insufficiency occurs when the vein walls become stretched, weakened, or damaged, or when valves within the vein are damaged. Some common causes of this include:  High blood pressure inside the veins (venous hypertension).  Increased blood pressure in the leg veins from long periods of sitting or standing.  A blood clot that blocks blood flow in a vein (deep vein thrombosis, DVT).  Inflammation of a vein (phlebitis) that causes a blood clot to form.  Tumors in the pelvis that cause blood   to back up.  What increases the risk? The following factors may make you more likely to develop this condition:  Having a family history of this condition.  Obesity.  Pregnancy.  Living without enough physical activity or exercise (sedentary lifestyle).  Smoking.  Having a job that requires long periods of standing or sitting in one place.  Being a certain age. Women in their 40s and 50s and men in their 70s are more likely to develop this condition.  What are  the signs or symptoms? Symptoms of this condition include:  Veins that are enlarged, bulging, or twisted (varicose veins).  Skin breakdown or ulcers.  Reddened or discolored skin on the front of the leg.  Brown, smooth, tight, and painful skin just above the ankle, usually on the inside of the leg (lipodermatosclerosis).  Swelling.  How is this diagnosed? This condition may be diagnosed based on:  Your medical history.  A physical exam.  Tests, such as: ? A procedure that creates an image of a blood vessel and nearby organs and provides information about blood flow through the blood vessel (duplex ultrasound). ? A procedure that tests blood flow (plethysmography). ? A procedure to look at the veins using X-ray and dye (venogram).  How is this treated? The goals of treatment are to help you return to an active life and to minimize pain or disability. Treatment depends on the severity of your condition, and it may include:  Wearing compression stockings. These can help relieve symptoms and help prevent your condition from getting worse. However, they do not cure the condition.  Sclerotherapy. This is a procedure involving an injection of a material that "dissolves" damaged veins.  Surgery. This may involve: ? Removing a diseased vein (vein stripping). ? Cutting off blood flow through the vein (laser ablation surgery). ? Repairing a valve.  Follow these instructions at home:  Wear compression stockings as told by your health care provider. These stockings help to prevent blood clots and reduce swelling in your legs.  Take over-the-counter and prescription medicines only as told by your health care provider.  Stay active by exercising, walking, or doing different activities. Ask your health care provider what activities are safe for you and how much exercise you need.  Drink enough fluid to keep your urine clear or pale yellow.  Do not use any products that contain nicotine  or tobacco, such as cigarettes and e-cigarettes. If you need help quitting, ask your health care provider.  Keep all follow-up visits as told by your health care provider. This is important. Contact a health care provider if:  You have redness, swelling, or more pain in the affected area.  You see a red streak or line that extends up or down from the affected area.  You have skin breakdown or a loss of skin in the affected area, even if the breakdown is small.  You get an injury in the affected area. Get help right away if:  You get an injury and an open wound in the affected area.  You have severe pain that does not get better with medicine.  You have sudden numbness or weakness in the foot or ankle below the affected area, or you have trouble moving your foot or ankle.  You have a fever and you have worse or persistent symptoms.  You have chest pain.  You have shortness of breath. Summary  Chronic venous insufficiency, also called venous stasis, is a condition that prevents blood from being   pumped effectively through the veins in your legs.  Chronic venous insufficiency occurs when the vein walls become stretched, weakened, or damaged, or when valves within the vein are damaged.  Treatment for this condition depends on how severe your condition is, and it may involve wearing compression stockings or having a procedure.  Make sure you stay active by exercising, walking, or doing different activities. Ask your health care provider what activities are safe for you and how much exercise you need. This information is not intended to replace advice given to you by your health care provider. Make sure you discuss any questions you have with your health care provider. Document Released: 09/07/2006 Document Revised: 03/23/2016 Document Reviewed: 03/23/2016 Elsevier Interactive Patient Education  2017 Elsevier Inc.   Elevate feet above slightly flexed knees, feet above heart,  overnight and 3-4x/day for 20 minutes.

## 2016-10-16 ENCOUNTER — Emergency Department (HOSPITAL_COMMUNITY): Payer: Medicare Other

## 2016-10-16 ENCOUNTER — Emergency Department (HOSPITAL_COMMUNITY)
Admission: EM | Admit: 2016-10-16 | Discharge: 2016-10-16 | Disposition: A | Discharge status: Home / Self Care | Payer: Medicare Other | Attending: Emergency Medicine | Admitting: Emergency Medicine

## 2016-10-16 ENCOUNTER — Encounter (HOSPITAL_COMMUNITY): Payer: Self-pay

## 2016-10-16 DIAGNOSIS — R1032 Left lower quadrant pain: Secondary | ICD-10-CM | POA: Insufficient documentation

## 2016-10-16 DIAGNOSIS — I509 Heart failure, unspecified: Secondary | ICD-10-CM | POA: Diagnosis not present

## 2016-10-16 DIAGNOSIS — E119 Type 2 diabetes mellitus without complications: Secondary | ICD-10-CM | POA: Insufficient documentation

## 2016-10-16 DIAGNOSIS — R109 Unspecified abdominal pain: Secondary | ICD-10-CM

## 2016-10-16 DIAGNOSIS — J449 Chronic obstructive pulmonary disease, unspecified: Secondary | ICD-10-CM | POA: Insufficient documentation

## 2016-10-16 DIAGNOSIS — M5432 Sciatica, left side: Secondary | ICD-10-CM | POA: Diagnosis not present

## 2016-10-16 DIAGNOSIS — I110 Hypertensive heart disease with heart failure: Secondary | ICD-10-CM | POA: Insufficient documentation

## 2016-10-16 DIAGNOSIS — Z79899 Other long term (current) drug therapy: Secondary | ICD-10-CM | POA: Diagnosis not present

## 2016-10-16 DIAGNOSIS — Z87891 Personal history of nicotine dependence: Secondary | ICD-10-CM | POA: Diagnosis not present

## 2016-10-16 LAB — CBC
HCT: 46.2 % (ref 39.0–52.0)
HEMOGLOBIN: 14.3 g/dL (ref 13.0–17.0)
MCH: 27.4 pg (ref 26.0–34.0)
MCHC: 31 g/dL (ref 30.0–36.0)
MCV: 88.7 fL (ref 78.0–100.0)
Platelets: 167 10*3/uL (ref 150–400)
RBC: 5.21 MIL/uL (ref 4.22–5.81)
RDW: 12.4 % (ref 11.5–15.5)
WBC: 8.1 10*3/uL (ref 4.0–10.5)

## 2016-10-16 LAB — COMPREHENSIVE METABOLIC PANEL
ALK PHOS: 64 U/L (ref 38–126)
ALT: 21 U/L (ref 17–63)
ANION GAP: 7 (ref 5–15)
AST: 28 U/L (ref 15–41)
Albumin: 3.3 g/dL — ABNORMAL LOW (ref 3.5–5.0)
BILIRUBIN TOTAL: 0.4 mg/dL (ref 0.3–1.2)
BUN: 10 mg/dL (ref 6–20)
CALCIUM: 9.2 mg/dL (ref 8.9–10.3)
CO2: 33 mmol/L — ABNORMAL HIGH (ref 22–32)
Chloride: 99 mmol/L — ABNORMAL LOW (ref 101–111)
Creatinine, Ser: 0.92 mg/dL (ref 0.61–1.24)
GLUCOSE: 43 mg/dL — AB (ref 65–99)
Potassium: 3.7 mmol/L (ref 3.5–5.1)
Sodium: 139 mmol/L (ref 135–145)
TOTAL PROTEIN: 7.3 g/dL (ref 6.5–8.1)

## 2016-10-16 LAB — URINALYSIS, ROUTINE W REFLEX MICROSCOPIC
BILIRUBIN URINE: NEGATIVE
Glucose, UA: NEGATIVE mg/dL
Hgb urine dipstick: NEGATIVE
Ketones, ur: NEGATIVE mg/dL
Leukocytes, UA: NEGATIVE
NITRITE: NEGATIVE
Protein, ur: NEGATIVE mg/dL
SPECIFIC GRAVITY, URINE: 1.004 — AB (ref 1.005–1.030)
pH: 7 (ref 5.0–8.0)

## 2016-10-16 LAB — CBG MONITORING, ED
GLUCOSE-CAPILLARY: 73 mg/dL (ref 65–99)
Glucose-Capillary: 135 mg/dL — ABNORMAL HIGH (ref 65–99)

## 2016-10-16 LAB — LIPASE, BLOOD: Lipase: 18 U/L (ref 11–51)

## 2016-10-16 NOTE — Discharge Instructions (Signed)
Take tylenol 1000mg(2 extra strength) four times a day.  ° °

## 2016-10-16 NOTE — ED Provider Notes (Signed)
[facility] DEPT Provider Note   CSN: [id number] Arrival date & time: 10/16/16  1542     History   Chief Complaint Chief Complaint  Patient presents with  . Abdominal Pain    HPI Ivan Parks is a 76 y.o. male.  76 yo M with a chief complaint of left flank pain. Going on for the past couple days. Worse with twisting and palpation. He feels that radiates down the anterior aspect of his left leg to the knee. Denies recent injury. Denies abdominal pain. Denies vomiting or fever. Denies dysuria or hematuria.   The history is provided by the patient.  Abdominal Pain   This is a new problem. The current episode started 2 days ago. The problem occurs constantly. The problem has not changed since onset.Pain location: left flank. The quality of the pain is sharp and cramping. The pain is at a severity of 7/10. The pain is moderate. Pertinent negatives include fever, diarrhea, vomiting, headaches, arthralgias and myalgias. Nothing aggravates the symptoms. Nothing relieves the symptoms.    Past Medical History:  Diagnosis Date  . Acute respiratory failure ([place])   . Bilateral lower extremity edema   . Bronchitis   . CHF (congestive heart failure) ([place])   . COPD (chronic obstructive pulmonary disease) ([place])   . DM (diabetes mellitus) ([place])   . HTN (hypertension)   . Hypoxemia   . OSA (obstructive sleep apnea)   . Peripheral vascular disease ([place])   . Tobacco abuse     Patient Active Problem List   Diagnosis Date Noted  . Diabetes ([place]) 05/02/2016  . AKI (acute kidney injury) ([place]) 04/01/2016  . Hypotension 03/31/2016  . Essential hypertension 03/06/2015  . Chronic respiratory failure ([place]) 11/26/2014  . Leg edema, right- Greater than Left leg 07/05/2014  . Peripheral vascular disease, unspecified ([place]) 11/09/2013  . Allergic rhinitis 11/09/2012  . 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 COLON SURGERY         Home  Medications    Prior to Admission medications   Medication Sig Start Date End Date Taking? Authorizing Provider  acetaminophen (TYLENOL) 500 MG tablet Take 500 mg by mouth every 8 (eight) hours as needed for mild pain.     [provider]  atorvastatin (LIPITOR) 20 MG tablet  08/19/16   [provider]  Blood Glucose Monitoring Suppl ([place]) w/Device KIT 2 each by Does not apply route 2 (two) times daily. 06/29/16   Renato Shin, MD  furosemide (LASIX) 20 MG tablet Take 1 tablet (20 mg total) by mouth every other day as needed for fluid or edema. Patient not taking: Reported on 10/15/2016 04/01/16   Florencia Reasons, MD  glucose blood [facility] VERIO) test strip Use to check blood sugar 2 times per day. 06/29/16   Renato Shin, MD  insulin NPH-regular Human (NOVOLIN 70/30 RELION) (70-30) 100 UNIT/ML injection Inject 50 Units into the skin daily with breakfast. And syringes 1/day 10/06/16   Renato Shin, MD  lisinopril (PRINIVIL) 10 MG tablet Take 1 tablet (10 mg total) by mouth daily. 04/01/16   Florencia Reasons, MD  [facility] DELICA LANCETS 58P MISC Use to check blood sugar 2 times per day. 06/29/16   Renato Shin, MD  Tiotropium Bromide-Olodaterol (STIOLTO RESPIMAT) 2.5-2.5 MCG/ACT AERS Inhale 2 puffs into the lungs daily. 06/24/16   Collene Gobble, MD    Family History Family History  Problem Relation Age of  Onset  . Diabetes Mother   . Hypertension Mother   . Peripheral vascular disease Mother        amputation  . Diabetes Father        Right Leg Amputation-Gangrene  . Hypertension Father   . Peripheral vascular disease Father        amputation  . Cancer Father        Lead Poison-Ca  . Pneumonia Father   . Diabetes Sister   . Hypertension Sister   . Diabetes Daughter   . Hypertension Daughter     Social History Social History  Substance Use Topics  . Smoking status: Former Smoker    Packs/day: 1.50    Years: 50.00    Types: Cigarettes    Quit date:  11/04/2010  . Smokeless tobacco: Never Used  . Alcohol use No     Allergies   Patient has no known allergies.   Review of Systems Review of Systems  Constitutional: Negative for chills and fever.  HENT: Negative for congestion and facial swelling.   Eyes: Negative for discharge and visual disturbance.  Respiratory: Negative for shortness of breath.   Cardiovascular: Negative for chest pain and palpitations.  Gastrointestinal: Negative for abdominal pain, diarrhea and vomiting.  Genitourinary: Positive for flank pain.  Musculoskeletal: Negative for arthralgias and myalgias.  Skin: Negative for color change and rash.  Neurological: Negative for tremors, syncope and headaches.  Psychiatric/Behavioral: Negative for confusion and dysphoric mood.     Physical Exam Updated Vital Signs BP (!) 160/86   Pulse 86   Temp 98.2 F (36.8 C) (Oral)   Resp 14   Ht _0  (1.651 m)   Wt 89.4 kg (197 lb)   SpO2 99%   BMI 32.78 kg/m   Physical Exam  Constitutional: He is oriented to person, place, and time. He appears well-developed and well-nourished.  HENT:  Head: Normocephalic and atraumatic.  Eyes: EOM are normal. Pupils are equal, round, and reactive to light.  Neck: Normal range of motion. Neck supple. No JVD present.  Cardiovascular: Normal rate and regular rhythm.  Exam reveals no gallop and no friction rub.   No murmur heard. Pulmonary/Chest: No respiratory distress. He has no wheezes.  Abdominal: He exhibits no distension and no mass. There is no tenderness. There is no rebound and no guarding.  Musculoskeletal: Normal range of motion.  No noted pain on exam. Patient points to the area of his left flank.  Neurological: He is alert and oriented to person, place, and time.  Skin: No rash noted. No pallor.  Psychiatric: He has a normal mood and affect. His behavior is normal.  Nursing note and vitals reviewed.    ED Treatments / Results  Labs (all labs ordered are listed,  but only abnormal results are displayed) Labs Reviewed  COMPREHENSIVE METABOLIC PANEL - Abnormal; Notable for the following:       Result Value   Chloride 99 (*)    CO2 33 (*)    Glucose, Bld 43 (*)    Albumin 3.3 (*)    All other components within normal limits  URINALYSIS, ROUTINE W REFLEX MICROSCOPIC - Abnormal; Notable for the following:    Color, Urine COLORLESS (*)    Specific Gravity, Urine 1.004 (*)    All other components within normal limits  CBG MONITORING, ED - Abnormal; Notable for the following:    Glucose-Capillary 135 (*)    All other components within normal limits  LIPASE, BLOOD  CBC  CBG MONITORING, ED    EKG  EKG Interpretation None       Radiology Ct Renal Stone Study  Result Date: 10/16/2016 CLINICAL DATA:  Lower abdominal pain x2 days EXAM: CT ABDOMEN AND PELVIS WITHOUT CONTRAST TECHNIQUE: Multidetector CT imaging of the abdomen and pelvis was performed following the standard protocol without IV contrast. COMPARISON:  Abdomen radiographs 01/23/2015 FINDINGS: Lower chest: Coronary arteriosclerosis and aortic atherosclerosis. Normal size cardiac chambers without pericardial effusion. No pneumonic consolidation or effusion. Hepatobiliary: No focal liver abnormality is seen. No gallstones, gallbladder wall thickening, or biliary dilatation. Pancreas: Unremarkable. No pancreatic ductal dilatation or surrounding inflammatory changes. Spleen: Normal in size without focal abnormality. Adrenals/Urinary Tract: Thickened, hyperplastic appearance of the adrenal glands bilaterally without focal mass. No obstructive uropathy. The unenhanced kidneys are unremarkable. There is no hydro ureteral nephrosis. The urinary bladder is physiologically distended. Stomach/Bowel: The stomach is nondistended. There is normal small bowel rotation without obstruction or inflammation. Normal appendix. A moderate to large amount of fecal retention is noted. Minimal pericolonic fatty induration  along the distal descending and proximal sigmoid colon may reflect a mild diverticulitis. Vascular/Lymphatic: Aortoiliac atherosclerosis without aneurysm. Small lymph nodes are seen the retroperitoneum and along the iliac chain. Reproductive: Normal prostate and seminal vesicles. Other: Tiny umbilical fat containing hernia. Subcutaneous nodule along the ventral aspect of the lower pelvis possibly a small epidermoid cyst. Musculoskeletal: Mild disc space narrowing at L1-2,L4-5 and L5-S1. No acute nor suspicious osseous abnormalities. IMPRESSION: 1. Mild thickening of both adrenal glands which may reflect adrenal hyperplasia. 2. Minimal pericolonic fatty induration in the junction of the distal descending and sigmoid colon may reflect the mild diverticulitis. 3. Lumbar spondylosis. 4. Coronary arteriosclerosis and aortic atherosclerosis. 5. Tiny subcutaneous nodule along the ventral aspect of the pelvis likely a small epidermoid cyst Electronically Signed   By: Ashley Royalty M.D.   On: 10/16/2016 23:01    Procedures Procedures (including critical care time)  Medications Ordered in ED Medications - No data to display   Initial Impression / Assessment and Plan / ED Course  I have reviewed the triage vital signs and the nursing notes.  Pertinent labs & imaging results that were available during my care of the patient were reviewed by me and considered in my medical decision making (see chart for details).     76 yo M With a chief complaint of left flank pain. CT scan with no acute finding in the area of patient's pain. Based on his history it sounds most likely like a radiculopathy. Love the patient to Tylenol. Follow-up with his family physician.  11:21 PM:  I have discussed the diagnosis/risks/treatment options with the patient and family and believe the pt to be eligible for discharge home to follow-up with PCP. We also discussed returning to the ED immediately if new or worsening sx occur. We  discussed the sx which are most concerning (e.g., sudden worsening pain, fever, inability to tolerate by mouth) that necessitate immediate return. Medications administered to the patient during their visit and any new prescriptions provided to the patient are listed below.  Medications given during this visit Medications - No data to display   The patient appears reasonably screen and/or stabilized for discharge and I doubt any other medical condition or other [facility] requiring further screening, evaluation, or treatment in the ED at this time prior to discharge.    Final Clinical Impressions(s) / ED Diagnoses   Final diagnoses:  Left flank pain  Sciatica  of left side    New Prescriptions New Prescriptions   No medications on file     Deno Etienne, DO 10/16/16 2321

## 2016-10-16 NOTE — ED Notes (Signed)
Dr. Floyd at bedside. 

## 2016-10-16 NOTE — ED Triage Notes (Signed)
Pt complaining of L sided abdominal pain that radiates to L flank. Pt states pain radiates to L leg. Pt denies any N/V/D. Pt denies any urinary symptoms.

## 2016-10-16 NOTE — ED Notes (Signed)
Pt verbalized understanding discharge instructions and denies any further needs or questions at this time. VS stable, ambulatory and steady gait.   

## 2016-10-16 NOTE — ED Notes (Signed)
ED Provider at bedside. 

## 2016-10-16 NOTE — ED Notes (Signed)
Pt in CT at this time.

## 2016-10-23 NOTE — Addendum Note (Signed)
Addended by: Burton ApleyPETTY, Preslynn Bier A on: 10/23/2016 02:32 PM   Modules accepted: Orders

## 2016-11-27 ENCOUNTER — Telehealth: Payer: Self-pay | Admitting: Emergency Medicine

## 2016-11-27 NOTE — Telephone Encounter (Signed)
Returned patient's call and got patient's wife who states patient wanted to know if he could have a POC. Upon looking in the chart, the last qualifying sats we have on this patient was from 2016. Informed her that he would need an ov. She states that this is fine but to call her daughter Steward DroneBrenda because she is the one who handles their appts and drives them due to them both unable to drive. Called daughter and an appt was made with NP. She understood and had no further questions at this time. Nothing further is needed

## 2016-12-07 ENCOUNTER — Telehealth: Payer: Self-pay | Admitting: Endocrinology

## 2016-12-07 ENCOUNTER — Ambulatory Visit: Payer: Medicare Other | Admitting: Endocrinology

## 2016-12-07 NOTE — Telephone Encounter (Signed)
Appointment cancelled

## 2016-12-07 NOTE — Telephone Encounter (Signed)
Patient called the [facility] 12/06/16 @ 8:25to cancel appt for 12/07/16.

## 2016-12-14 ENCOUNTER — Telehealth: Payer: Self-pay | Admitting: Endocrinology

## 2016-12-14 NOTE — Telephone Encounter (Signed)
It is unlikely to be a problem.  However, pt did not keep appt, so can't say for sure.

## 2016-12-14 NOTE — Telephone Encounter (Signed)
Please advise, Thanks!  

## 2016-12-14 NOTE — Telephone Encounter (Signed)
Patient's daughter calling in reference to beach trip on August 16th. She wants to make sure the doctor OK's patient to go. Please call patient's daughter and advise.

## 2016-12-15 NOTE — Telephone Encounter (Signed)
Called and notified patient's daughter that it would unlikely be a problem. They also had made follow up appt for the 28th.

## 2016-12-16 ENCOUNTER — Telehealth: Payer: Self-pay | Admitting: Adult Health

## 2016-12-16 NOTE — Telephone Encounter (Signed)
Patient's wife Luetta NuttingHattie is returning phone call [phone]

## 2016-12-16 NOTE — Telephone Encounter (Signed)
Called and spoke with pts wife and she stated that the pt has the oxygen tanks and the machine that stays at the house.  They are wanting to go to the beach and they are wanting to know what they need to use.  [facility]  They are wanting to leave on the 16th to go to the beach.  I have scheduled the pt to see TP on 8/3 at 4:15.  The pts wife will make his daughter aware.

## 2016-12-16 NOTE — Telephone Encounter (Signed)
[id number] x1 

## 2016-12-18 ENCOUNTER — Encounter: Payer: Self-pay | Admitting: Adult Health

## 2016-12-18 ENCOUNTER — Ambulatory Visit (INDEPENDENT_AMBULATORY_CARE_PROVIDER_SITE_OTHER): Payer: Medicare Other | Admitting: Adult Health

## 2016-12-18 VITALS — BP 134/66 | HR 98 | Ht 64.0 in | Wt 186.4 lb

## 2016-12-18 DIAGNOSIS — J449 Chronic obstructive pulmonary disease, unspecified: Secondary | ICD-10-CM | POA: Diagnosis not present

## 2016-12-18 DIAGNOSIS — J9611 Chronic respiratory failure with hypoxia: Secondary | ICD-10-CM

## 2016-12-18 MED ORDER — TIOTROPIUM BROMIDE-OLODATEROL 2.5-2.5 MCG/ACT IN AERS: 2.0000 | INHALATION_SPRAY | Freq: Every day | RESPIRATORY_TRACT | 0 refills | Status: DC

## 2016-12-18 NOTE — [place] Notes (Signed)
'@Patient'  ID: Ivan Parks, male    DOB: 30-Apr-1941, 76 y.o.   MRN: [id number]  Chief Complaint  Patient presents with  . Follow-up    COPD     Referring provider: Harlan Stains, MD  HPI: 76 year old male former smoker followed for COPD and chronic hypoxic respiratory failure, on O2 History of critical illness. 2012 with vent dependent respiratory failure  12/18/2016 Follow up : COPD  Pt presents for a six-month follow-up. Says overall that his breathing is doing okay.  He remains on Stiolto . No flare of cough or wheezing .  PVX utd.  Gets winded with walking long distance.  Remains O2 . Going to beach soon needs a Marine scientist for trip . Order to [facility] sent .    No Known Allergies  Immunization History  Administered Date(s) Administered  . Influenza Whole 11/10/2010  . Pneumococcal Polysaccharide-23 11/10/2010    Past Medical History:  Diagnosis Date  . Acute respiratory failure ([place])   . Bilateral lower extremity edema   . Bronchitis   . CHF (congestive heart failure) ([place])   . COPD (chronic obstructive pulmonary disease) ([place])   . DM (diabetes mellitus) ([place])   . HTN (hypertension)   . Hypoxemia   . OSA (obstructive sleep apnea)   . Peripheral vascular disease ([place])   . Tobacco abuse     Tobacco History: History  Smoking Status  . Former Smoker  . Packs/day: 1.50  . Years: 50.00  . Types: Cigarettes  . Quit date: 11/04/2010  Smokeless Tobacco  . Never Used   Counseling given: Not Answered   Outpatient Encounter Prescriptions as of 12/18/2016  Medication Sig  . acetaminophen (TYLENOL) 500 MG tablet Take 500 mg by mouth every 8 (eight) hours as needed for mild pain.   Marland Kitchen atorvastatin (LIPITOR) 20 MG tablet Take 20 mg by mouth daily at 6 PM.   . Blood Glucose Monitoring Suppl (ONETOUCH VERIO FLEX SYSTEM) w/Device KIT 2 each by Does not apply route 2 (two) times daily.  . furosemide (LASIX) 20 MG tablet Take 1 tablet (20 mg total) by mouth every  other day as needed for fluid or edema.  Marland Kitchen glucose blood (ONETOUCH VERIO) test strip Use to check blood sugar 2 times per day.  . insulin NPH-regular Human (NOVOLIN 70/30 RELION) (70-30) 100 UNIT/ML injection Inject 50 Units into the skin daily with breakfast. And syringes 1/day  . lisinopril (PRINIVIL) 10 MG tablet Take 1 tablet (10 mg total) by mouth daily.  Glory Rosebush DELICA LANCETS 61W MISC Use to check blood sugar 2 times per day.  . Tiotropium Bromide-Olodaterol (STIOLTO RESPIMAT) 2.5-2.5 MCG/ACT AERS Inhale 2 puffs into the lungs daily.  . Tiotropium Bromide-Olodaterol (STIOLTO RESPIMAT) 2.5-2.5 MCG/ACT AERS Inhale 2 puffs into the lungs daily.   No facility-administered encounter medications on file as of 12/18/2016.      Review of Systems  Constitutional:   No  weight loss, night sweats,  Fevers, chills,  +fatigue, or  lassitude.  HEENT:   No headaches,  Difficulty swallowing,  Tooth/dental problems, or  Sore throat,                No sneezing, itching, ear ache, nasal congestion, post nasal drip,   CV:  No chest pain,  Orthopnea, PND, swelling in lower extremities, anasarca, dizziness, palpitations, syncope.   GI  No heartburn, indigestion, abdominal pain, nausea, vomiting, diarrhea, change in bowel habits, loss of appetite, bloody stools.   Resp:  No chest wall deformity  Skin: no rash or lesions.  GU: no dysuria, change in color of urine, no urgency or frequency.  No flank pain, no hematuria   MS:  No joint pain or swelling.  No decreased range of motion.  No back pain.    Physical Exam  BP 134/66 (BP Location: Left Arm, Cuff Size: Normal)   Pulse 98   Ht '5\' 4"'  (1.626 m)   Wt 186 lb 6.4 oz (84.6 kg)   SpO2 94%   BMI 32.00 kg/m   GEN: A/Ox3; pleasant , NAD, obese    HEENT:  [place]/AT,  EACs-clear, TMs-wnl, NOSE-clear, THROAT-clear, no lesions, no postnasal drip or exudate noted.   NECK:  Supple w/ fair ROM; no JVD; normal carotid impulses w/o bruits; no  thyromegaly or nodules palpated; no lymphadenopathy.    RESP  Decreased BS in bases . no accessory muscle use, no dullness to percussion  CARD:  RRR, no m/r/g, no peripheral edema, pulses intact, no cyanosis or clubbing.  GI:   Soft & nt; nml bowel sounds; no organomegaly or masses detected.   Musco: Warm bil, no deformities or joint swelling noted.   Neuro: alert, no focal deficits noted.    Skin: Warm, no lesions or rashes    Lab Results:  CBC  BNPImaging: No results found.   Assessment & Plan:   COPD, severe Compensated without flare   Plan  Patient Instructions  Continue on current regimen Continue on Oxygen 2.5l/m  Follow up Dr. Lamonte Sakai in 4 months and As needed      Chronic respiratory failure Cont on O2      Glen Kesinger, NP 12/18/2016

## 2016-12-18 NOTE — Patient Instructions (Signed)
Continue on current regimen Continue on Oxygen 2.5l/m  Follow up Dr. Delton CoombesByrum in 4 months and As needed

## 2016-12-18 NOTE — Assessment & Plan Note (Signed)
Cont on O2 .  

## 2016-12-18 NOTE — Assessment & Plan Note (Signed)
Compensated without flare   Plan  Patient Instructions  Continue on current regimen Continue on Oxygen 2.5l/m  Follow up Dr. Delton CoombesByrum in 4 months and As needed

## 2016-12-18 NOTE — Addendum Note (Signed)
Addended by: Boone MasterJONES, JESSICA E on: 12/18/2016 04:50 PM   Modules accepted: Orders

## 2016-12-29 ENCOUNTER — Emergency Department (HOSPITAL_COMMUNITY): Payer: Medicare Other

## 2016-12-29 ENCOUNTER — Emergency Department (HOSPITAL_COMMUNITY)
Admission: EM | Admit: 2016-12-29 | Discharge: 2016-12-30 | Disposition: A | Discharge status: Home / Self Care | Payer: Medicare Other | Attending: Emergency Medicine | Admitting: Emergency Medicine

## 2016-12-29 DIAGNOSIS — J449 Chronic obstructive pulmonary disease, unspecified: Secondary | ICD-10-CM | POA: Diagnosis not present

## 2016-12-29 DIAGNOSIS — I509 Heart failure, unspecified: Secondary | ICD-10-CM | POA: Diagnosis not present

## 2016-12-29 DIAGNOSIS — E119 Type 2 diabetes mellitus without complications: Secondary | ICD-10-CM | POA: Diagnosis not present

## 2016-12-29 DIAGNOSIS — Z794 Long term (current) use of insulin: Secondary | ICD-10-CM | POA: Insufficient documentation

## 2016-12-29 DIAGNOSIS — Z79899 Other long term (current) drug therapy: Secondary | ICD-10-CM | POA: Insufficient documentation

## 2016-12-29 DIAGNOSIS — E162 Hypoglycemia, unspecified: Secondary | ICD-10-CM | POA: Diagnosis not present

## 2016-12-29 DIAGNOSIS — Z87891 Personal history of nicotine dependence: Secondary | ICD-10-CM | POA: Insufficient documentation

## 2016-12-29 DIAGNOSIS — I110 Hypertensive heart disease with heart failure: Secondary | ICD-10-CM | POA: Diagnosis not present

## 2016-12-29 LAB — CBC WITH DIFFERENTIAL/PLATELET
BASOS ABS: 0 10*3/uL (ref 0.0–0.1)
Basophils Relative: 0 %
EOS PCT: 0 %
Eosinophils Absolute: 0 10*3/uL (ref 0.0–0.7)
HCT: 44 % (ref 39.0–52.0)
Hemoglobin: 14.6 g/dL (ref 13.0–17.0)
LYMPHS PCT: 10 %
Lymphs Abs: 1.4 10*3/uL (ref 0.7–4.0)
MCH: 28 pg (ref 26.0–34.0)
MCHC: 33.2 g/dL (ref 30.0–36.0)
MCV: 84.5 fL (ref 78.0–100.0)
MONO ABS: 0.6 10*3/uL (ref 0.1–1.0)
MONOS PCT: 5 %
Neutro Abs: 12 10*3/uL — ABNORMAL HIGH (ref 1.7–7.7)
Neutrophils Relative %: 85 %
PLATELETS: 196 10*3/uL (ref 150–400)
RBC: 5.21 MIL/uL (ref 4.22–5.81)
RDW: 12.4 % (ref 11.5–15.5)
WBC: 14.1 10*3/uL — ABNORMAL HIGH (ref 4.0–10.5)

## 2016-12-29 LAB — BASIC METABOLIC PANEL
ANION GAP: 7 (ref 5–15)
BUN: 19 mg/dL (ref 6–20)
CALCIUM: 8.6 mg/dL — AB (ref 8.9–10.3)
CO2: 31 mmol/L (ref 22–32)
CREATININE: 0.9 mg/dL (ref 0.61–1.24)
Chloride: 97 mmol/L — ABNORMAL LOW (ref 101–111)
GFR calc Af Amer: >60 mL/min (ref >60)
GLUCOSE: 90 mg/dL (ref 65–99)
Potassium: 4.1 mmol/L (ref 3.5–5.1)
Sodium: 135 mmol/L (ref 135–145)

## 2016-12-29 LAB — CBG MONITORING, ED
GLUCOSE-CAPILLARY: 88 mg/dL (ref 65–99)
Glucose-Capillary: 70 mg/dL (ref 65–99)

## 2016-12-29 NOTE — ED Notes (Signed)
CBG 88 

## 2016-12-29 NOTE — ED Notes (Signed)
Bed: WA17 Expected date:  Expected time:  Means of arrival:  Comments: Hypoglycemia of 27

## 2016-12-29 NOTE — ED Triage Notes (Addendum)
Per EMS- patient's family called for altered mental status. CBG-27 when EMs arrived. Patient was combative and was unable to get Iv started and [patient refused to eat. Patient was given glucagon 1 mg IM prior to EMs arrival and CBG increased to 100. Patient is on home O2 2L/min via [place] due to history of COPD.

## 2016-12-29 NOTE — ED Notes (Signed)
Unable to collect labs patient is not in room 

## 2016-12-30 ENCOUNTER — Ambulatory Visit: Payer: Medicare Other | Admitting: Adult Health

## 2016-12-30 LAB — CBG MONITORING, ED
Glucose-Capillary: 69 mg/dL (ref 65–99)
Glucose-Capillary: 87 mg/dL (ref 65–99)

## 2016-12-30 NOTE — ED Provider Notes (Signed)
[place] DEPT Provider Note   CSN: [id number] Arrival date & time: 12/29/16  1833     History   Chief Complaint Chief Complaint  Patient presents with  . Hypoglycemia    HPI Ivan Parks is a 76 y.o. male.  HPI   76 year old male with a history of CHF, COPD, diabetes, hypertension, COPD on 2 L, who presents with concern for altered mental status. Wife reports that he had had 50 units of insulin this morning per his regular regimen, have breakfast, however did not eat anything for the rest of the day. She was getting ready to make him dinner, when he began to act confused. He then was combative.  They tried to get him to eat but he was too confused and refused.  When EMS arrived his glucose was 27.  He would not allow them to start an IV, was combative and didn't want to eat. They gave him 59m of IM glucagon and it increased to 100. On arrival to the ED, he is alert and oriented. Family again reports he did not have anything to eat today. They deny recent illness, including no chest pain, no dyspnea, no fevers, no urinary symptoms, no cough/congestion/sore throat, no diarrhea/vomiting, falls, numbness/weakness. FGolden Circleout of bed about 1 week ago and has had right lower back pain that goes down into right hip.  Denies medication changes.  Past Medical History:  Diagnosis Date  . Acute respiratory failure ([place]   . Bilateral lower extremity edema   . Bronchitis   . CHF (congestive heart failure) ([place]   . COPD (chronic obstructive pulmonary disease) ([place]   . DM (diabetes mellitus) ([place]   . HTN (hypertension)   . Hypoxemia   . OSA (obstructive sleep apnea)   . Peripheral vascular disease ([facility]   . Tobacco abuse     Patient Active Problem List   Diagnosis Date Noted  . Diabetes ([facility] [facility] 05/02/2016  . AKI (acute kidney injury) ([facility] 04/01/2016  . Hypotension 03/31/2016  . Essential hypertension 03/06/2015  . Chronic respiratory failure ([place] 11/26/2014  . Leg edema, right-  Greater than Left leg 07/05/2014  . Peripheral vascular disease, unspecified ([facility] 11/09/2013  . Allergic rhinitis 11/09/2012  . COPD, severe ([facility] 11/19/2010    Past Surgical History:  Procedure Laterality Date  . COLON SURGERY         Home Medications    Prior to Admission medications   Medication Sig Start Date End Date Taking? Authorizing Provider  acetaminophen (TYLENOL) 500 MG tablet Take 500 mg by mouth every 8 (eight) hours as needed for mild pain.    Yes [provider]  atorvastatin (LIPITOR) 20 MG tablet Take 20 mg by mouth daily at 6 PM.  08/19/16  Yes [provider]  furosemide (LASIX) 20 MG tablet Take 1 tablet (20 mg total) by mouth every other day as needed for fluid or edema. Patient taking differently: Take 20 mg by mouth daily as needed for fluid or edema.  04/01/16  Yes XFlorencia Reasons MD  insulin NPH-regular Human (NOVOLIN 70/30 RELION) (70-30) 100 UNIT/ML injection Inject 50 Units into the skin daily with breakfast. And syringes 1/day 10/06/16  Yes ERenato Shin MD  lisinopril (PRINIVIL) 10 MG tablet Take 1 tablet (10 mg total) by mouth daily. 04/01/16  Yes XFlorencia Reasons MD  Tiotropium Bromide-Olodaterol (STIOLTO RESPIMAT) 2.5-2.5 MCG/ACT AERS Inhale 2 puffs into the lungs daily. 12/18/16  Yes Parrett, TFonnie Mu NP  Blood Glucose Monitoring Suppl ([facility]  [place]) w/Device KIT 2 each by Does not apply route 2 (two) times daily. 06/29/16   Renato Shin, MD  glucose blood [facility] VERIO) test strip Use to check blood sugar 2 times per day. 06/29/16   Renato Shin, MD  [facility] DELICA LANCETS 79T MISC Use to check blood sugar 2 times per day. 06/29/16   Renato Shin, MD  Tiotropium Bromide-Olodaterol (STIOLTO RESPIMAT) 2.5-2.5 MCG/ACT AERS Inhale 2 puffs into the lungs daily. Patient not taking: Reported on 12/29/2016 06/24/16   Collene Gobble, MD    Family History Family History  Problem Relation Age of Onset  . Diabetes Mother   . Hypertension  Mother   . Peripheral vascular disease Mother        amputation  . Diabetes Father        Right Leg Amputation-Gangrene  . Hypertension Father   . Peripheral vascular disease Father        amputation  . Cancer Father        Lead Poison-Ca  . Pneumonia Father   . Diabetes Sister   . Hypertension Sister   . Diabetes Daughter   . Hypertension Daughter     Social History Social History  Substance Use Topics  . Smoking status: Former Smoker    Packs/day: 1.50    Years: 50.00    Types: Cigarettes    Quit date: 11/04/2010  . Smokeless tobacco: Never Used  . Alcohol use No     Allergies   Patient has no known allergies.   Review of Systems Review of Systems  Constitutional: Negative for fever.  HENT: Negative for sore throat.   Eyes: Negative for visual disturbance.  Respiratory: Negative for shortness of breath.   Cardiovascular: Negative for chest pain.  Gastrointestinal: Negative for abdominal pain, diarrhea, nausea and vomiting.  Genitourinary: Negative for difficulty urinating.  Musculoskeletal: Negative for back pain and neck stiffness.  Skin: Negative for rash.  Neurological: Negative for syncope, weakness, numbness and headaches.  Psychiatric/Behavioral: Positive for confusion (now resolved).     Physical Exam Updated Vital Signs BP (!) 162/81   Pulse 91   Temp 97.6 F (36.4 C) (Oral)   Resp 16   SpO2 99%   Physical Exam  Constitutional: He is oriented to person, place, and time. He appears well-developed and well-nourished. No distress.  HENT:  Head: Normocephalic and atraumatic.  Eyes: Conjunctivae and EOM are normal.  Neck: Normal range of motion.  Cardiovascular: Normal rate, regular rhythm, normal heart sounds and intact distal pulses.  Exam reveals no gallop and no friction rub.   No murmur heard. Pulmonary/Chest: Effort normal and breath sounds normal. No respiratory distress. He has no wheezes. He has no rales.  Abdominal: Soft. He exhibits  no distension. There is no tenderness. There is no guarding.  Musculoskeletal: He exhibits no edema.  Neurological: He is alert and oriented to person, place, and time.  Skin: Skin is warm and dry. He is not diaphoretic.  Nursing note and vitals reviewed.    ED Treatments / Results  Labs (all labs ordered are listed, but only abnormal results are displayed) Labs Reviewed  CBC WITH DIFFERENTIAL/PLATELET - Abnormal; Notable for the following:       Result Value   WBC 14.1 (*)    Neutro Abs 12.0 (*)    All other components within normal limits  BASIC METABOLIC PANEL - Abnormal; Notable for the following:    Chloride 97 (*)    Calcium 8.6 (*)  All other components within normal limits  CBG MONITORING, ED  CBG MONITORING, ED  CBG MONITORING, ED  CBG MONITORING, ED    EKG  EKG Interpretation None       Radiology Dg Lumbar Spine Complete  Result Date: 12/29/2016 CLINICAL DATA:  Pain following fall EXAM: LUMBAR SPINE - COMPLETE 4+ VIEW COMPARISON:  None. FINDINGS: Frontal, lateral, spot lumbosacral lateral, and bilateral oblique views were obtained. There are 5 non-rib-bearing lumbar type vertebral bodies. There is no fracture or spondylolisthesis. There is severe disc space narrowing at L4-5 and L5-S1. There is slight disc space narrowing at L3-4. There are prominent anterior osteophytes at L2, L3, L4. There is facet osteoarthritic change at L3-4, L4-5, and L5-S1 on the left at L5-S1 on the right. There are foci of atherosclerotic calcification in the distal aorta. IMPRESSION: Multilevel arthropathy. No fracture or spondylolisthesis. There is aortic atherosclerosis. Aortic Atherosclerosis (ICD10-I70.0). Electronically Signed   By: Lowella Grip III M.D.   On: 12/29/2016 20:33    Procedures Procedures (including critical care time)  Medications Ordered in ED Medications - No data to display   Initial Impression / Assessment and Plan / ED Course  I have reviewed the  triage vital signs and the nursing notes.  Pertinent labs & imaging results that were available during my care of the patient were reviewed by me and considered in my medical decision making (see chart for details).      76 year old male with a history of CHF, COPD, diabetes, hypertension, COPD on 2 L, who presents with concern for altered mental status and hypoglycemia to 54 with EMS.  Received glucagon and had glucose of 100 with EMS. Was 66 on arrival, patient able to tolerate po.  No history to suggest infectious etiology. Suspect patient took full dose insulin this AM and did not eat today with insulin peaking in afternoon causing hypoglycemia. Observed in the ED without return of hypoglycemia. He is tolerating po. He is beyond insulin peak at this time, should have little effect. Recommend close PCP follow up. Patient discharged in stable condition with understanding of reasons to return.   Final Clinical Impressions(s) / ED Diagnoses   Final diagnoses:  Hypoglycemia    New Prescriptions New Prescriptions   No medications on file     Gareth Morgan, MD 12/30/16 [phone]

## 2017-01-05 ENCOUNTER — Ambulatory Visit (INDEPENDENT_AMBULATORY_CARE_PROVIDER_SITE_OTHER): Payer: Medicare Other | Admitting: Endocrinology

## 2017-01-05 ENCOUNTER — Encounter: Payer: Self-pay | Admitting: Endocrinology

## 2017-01-05 VITALS — BP 148/88 | HR 86 | Wt 186.4 lb

## 2017-01-05 DIAGNOSIS — Z794 Long term (current) use of insulin: Secondary | ICD-10-CM

## 2017-01-05 DIAGNOSIS — E1122 Type 2 diabetes mellitus with diabetic chronic kidney disease: Secondary | ICD-10-CM | POA: Diagnosis not present

## 2017-01-05 DIAGNOSIS — N182 Chronic kidney disease, stage 2 (mild): Secondary | ICD-10-CM

## 2017-01-05 LAB — POCT GLYCOSYLATED HEMOGLOBIN (HGB A1C): HEMOGLOBIN A1C: 7.7

## 2017-01-05 MED ORDER — INSULIN NPH ISOPHANE & REGULAR (70-30) 100 UNIT/ML ~~LOC~~ SUSP
30.0000 [IU] | Freq: Every day | SUBCUTANEOUS | 11 refills | Status: DC
Start: 2017-01-05 — End: 2017-08-25

## 2017-01-05 NOTE — [place] Notes (Signed)
Subjective:    Patient ID: Ivan Parks, male    DOB: 07-15-1940, 76 y.o.   MRN: [id number]  HPI Pt returns for f/u of diabetes mellitus: DM type: Insulin-requiring type 2.  Dx'ed: 2778 Complications: polyneuropathy, PAD, and renal insufficiency.   Therapy: insulin since 2016.   DKA: never Severe hypoglycemia: last episode was 2018.  Pancreatitis: never.   Other: he takes QD insulin, due to noncompliance; he takes human insulin, due to cost; he has long duration of action of insulin, prob due to renal insuff; NPH was changed to 70/30, due to AM hypoglycemia and PM hyperglycemia. Interval history: Last week, he had 2 episodes of severe hypoglycemia.  both happened in the afternoon.  He says this due to decreased caloric intake, which he says is better now.   Past Medical History:  Diagnosis Date  . Acute respiratory failure ([place])   . Bilateral lower extremity edema   . Bronchitis   . CHF (congestive heart failure) ([place])   . COPD (chronic obstructive pulmonary disease) ([place])   . DM (diabetes mellitus) ([place])   . HTN (hypertension)   . Hypoxemia   . OSA (obstructive sleep apnea)   . Peripheral vascular disease ([place])   . Tobacco abuse     Past Surgical History:  Procedure Laterality Date  . COLON SURGERY      Social History   Social History  . Marital status: Married    Spouse name: N/A  . Number of children: 7  . Years of education: N/A   Occupational History  . retired    Social History Main Topics  . Smoking status: Former Smoker    Packs/day: 1.50    Years: 50.00    Types: Cigarettes    Quit date: 11/04/2010  . Smokeless tobacco: Never Used  . Alcohol use No  . Drug use: No  . Sexual activity: Not on file   Other Topics Concern  . Not on file   Social History Narrative  . No narrative on file    Current Outpatient Prescriptions on File Prior to Visit  Medication Sig Dispense Refill  . acetaminophen (TYLENOL) 500 MG tablet Take 500 mg by mouth every  8 (eight) hours as needed for mild pain.     Marland Kitchen atorvastatin (LIPITOR) 20 MG tablet Take 20 mg by mouth daily at 6 PM.     . Blood Glucose Monitoring Suppl (ONETOUCH VERIO FLEX SYSTEM) w/Device KIT 2 each by Does not apply route 2 (two) times daily. 1 kit 2  . furosemide (LASIX) 20 MG tablet Take 1 tablet (20 mg total) by mouth every other day as needed for fluid or edema. (Patient taking differently: Take 20 mg by mouth daily as needed for fluid or edema. ) 30 tablet 0  . glucose blood (ONETOUCH VERIO) test strip Use to check blood sugar 2 times per day. 200 each 2  . lisinopril (PRINIVIL) 10 MG tablet Take 1 tablet (10 mg total) by mouth daily. 30 tablet 0  . ONETOUCH DELICA LANCETS 24M MISC Use to check blood sugar 2 times per day. 200 each 2  . Tiotropium Bromide-Olodaterol (STIOLTO RESPIMAT) 2.5-2.5 MCG/ACT AERS Inhale 2 puffs into the lungs daily. 3 Inhaler 1  . Tiotropium Bromide-Olodaterol (STIOLTO RESPIMAT) 2.5-2.5 MCG/ACT AERS Inhale 2 puffs into the lungs daily. 3 Inhaler 0   No current facility-administered medications on file prior to visit.     No Known Allergies  Family History  Problem Relation Age of  Onset  . Diabetes Mother   . Hypertension Mother   . Peripheral vascular disease Mother        amputation  . Diabetes Father        Right Leg Amputation-Gangrene  . Hypertension Father   . Peripheral vascular disease Father        amputation  . Cancer Father        Lead Poison-Ca  . Pneumonia Father   . Diabetes Sister   . Hypertension Sister   . Diabetes Daughter   . Hypertension Daughter     BP (!) 148/88   Pulse 86   Wt 186 lb 6.4 oz (84.6 kg)   SpO2 93%   BMI 32.00 kg/m   Review of Systems He has lost 8 lbs recently.      Objective:   Physical Exam VITAL SIGNS:  See vs page GENERAL: no distress Pulses: foot pulses are intact bilaterally.   MSK: no deformity of the feet or ankles.  CV: 1+ bilat edema of the legs.   Skin:  no ulcer on the feet or  ankles.  Normal temp on the feet and ankles.  There is dense hyperpigmentation on the feet and legs. Neuro: sensation is intact to touch on the feet and ankles, but decreased from normal.     Lab Results  Component Value Date   CREATININE 0.90 12/29/2016   BUN 19 12/29/2016   NA 135 12/29/2016   K 4.1 12/29/2016   CL 97 (L) 12/29/2016   CO2 31 12/29/2016   Lab Results  Component Value Date   HGBA1C 7.7 01/05/2017      Assessment & Plan:  Insulin-requiring type 2 DM, with PAD: this is the best control this pt should aim for, given this regimen, which does match insulin to her changing needs throughout the day hypoglycemia, worse: in this setting, he is not a candidate for aggressive glycemic control.    Patient Instructions  check your blood sugar twice a day.  vary the time of day when you check, between before the 3 meals, and at bedtime.  also check if you have symptoms of your blood sugar being too high or too low.  please keep a record of the readings and bring it to your next appointment here (or you can bring the meter itself).  You can write it on any piece of paper.  please call us sooner if your blood sugar goes below 70, or if you have a lot of readings over 200.   please reduce the insulin to 30 units with breakfast.   On this type of insulin schedule, you should eat meals on a regular schedule (especially lunch).  If a meal is missed or significantly delayed, your blood sugar could go low.   Please come back for a follow-up appointment in 2 months.

## 2017-01-05 NOTE — Patient Instructions (Signed)
check your blood sugar twice a day.  vary the time of day when you check, between before the 3 meals, and at bedtime.  also check if you have symptoms of your blood sugar being too high or too low.  please keep a record of the readings and bring it to your next appointment here (or you can bring the meter itself).  You can write it on any piece of paper.  please call us sooner if your blood sugar goes below 70, or if you have a lot of readings over 200.   please reduce the insulin to 30 units with breakfast.   On this type of insulin schedule, you should eat meals on a regular schedule (especially lunch).  If a meal is missed or significantly delayed, your blood sugar could go low.   Please come back for a follow-up appointment in 2 months.

## 2017-01-09 ENCOUNTER — Encounter (HOSPITAL_COMMUNITY): Payer: Self-pay | Admitting: Emergency Medicine

## 2017-01-09 ENCOUNTER — Emergency Department (HOSPITAL_COMMUNITY): Payer: Medicare Other

## 2017-01-09 ENCOUNTER — Emergency Department (HOSPITAL_COMMUNITY)
Admission: EM | Admit: 2017-01-09 | Discharge: 2017-01-09 | Disposition: A | Discharge status: Home / Self Care | Payer: Medicare Other | Attending: Emergency Medicine | Admitting: Emergency Medicine

## 2017-01-09 DIAGNOSIS — Z87891 Personal history of nicotine dependence: Secondary | ICD-10-CM | POA: Insufficient documentation

## 2017-01-09 DIAGNOSIS — M25552 Pain in left hip: Secondary | ICD-10-CM | POA: Diagnosis not present

## 2017-01-09 DIAGNOSIS — M549 Dorsalgia, unspecified: Secondary | ICD-10-CM | POA: Diagnosis present

## 2017-01-09 DIAGNOSIS — M5442 Lumbago with sciatica, left side: Secondary | ICD-10-CM

## 2017-01-09 DIAGNOSIS — Z794 Long term (current) use of insulin: Secondary | ICD-10-CM | POA: Insufficient documentation

## 2017-01-09 DIAGNOSIS — M25551 Pain in right hip: Secondary | ICD-10-CM | POA: Insufficient documentation

## 2017-01-09 DIAGNOSIS — I110 Hypertensive heart disease with heart failure: Secondary | ICD-10-CM | POA: Insufficient documentation

## 2017-01-09 DIAGNOSIS — E119 Type 2 diabetes mellitus without complications: Secondary | ICD-10-CM | POA: Insufficient documentation

## 2017-01-09 DIAGNOSIS — Z79899 Other long term (current) drug therapy: Secondary | ICD-10-CM | POA: Insufficient documentation

## 2017-01-09 DIAGNOSIS — J449 Chronic obstructive pulmonary disease, unspecified: Secondary | ICD-10-CM | POA: Diagnosis not present

## 2017-01-09 DIAGNOSIS — I509 Heart failure, unspecified: Secondary | ICD-10-CM | POA: Insufficient documentation

## 2017-01-09 DIAGNOSIS — M5441 Lumbago with sciatica, right side: Secondary | ICD-10-CM

## 2017-01-09 MED ORDER — HYDROCODONE-ACETAMINOPHEN 5-325 MG PO TABS: 1.0000 | ORAL_TABLET | Freq: Four times a day (QID) | ORAL | 0 refills | Status: DC | PRN

## 2017-01-09 MED ORDER — DEXAMETHASONE SODIUM PHOSPHATE 10 MG/ML IJ SOLN
10.0000 mg | Freq: Once | INTRAMUSCULAR | Status: AC
  Administered 2017-01-09: 10 mg via INTRAMUSCULAR
  Filled 2017-01-09: qty 1

## 2017-01-09 MED ORDER — HYDROCODONE-ACETAMINOPHEN 5-325 MG PO TABS
1.0000 | ORAL_TABLET | Freq: Once | ORAL | Status: AC
  Administered 2017-01-09: 1 via ORAL
  Filled 2017-01-09: qty 1

## 2017-01-09 MED ORDER — PREDNISONE 20 MG PO TABS: 40.0000 mg | ORAL_TABLET | Freq: Every day | ORAL | 0 refills | Status: DC

## 2017-01-09 NOTE — Discharge Instructions (Signed)
As discussed, your evaluation today has been largely reassuring.  But, it is important that you monitor your condition carefully, and do not hesitate to return to the ED if you develop new, or concerning changes in your condition. ? ?Otherwise, please follow-up with your physician for appropriate ongoing care. ? ?

## 2017-01-09 NOTE — ED Provider Notes (Signed)
[facility] DEPT Provider Note   CSN: [id number] Arrival date & time: 01/09/17  1021     History   Chief Complaint Chief Complaint  Patient presents with  . Back Pain    HPI BARNIE SOPKO is a 76 y.o. male.  HPI  Patient presents with ongoing back pain. Pain is bilateral radiated down both posterior/lateral thighs. Pain is worse with any attempt at motion, ambulation. Pain began about 2 weeks ago after mechanical fall, and transiently was better, but has become more pronounced last few days. No loss of sensation in the distal extremities, no abdominal pain, no urinary changes, no fever, no chills. History of surgery in the lumbar spine. Patient  He has had no relief with tramadol.  Patient is here with his wife notes that since the fall several weeks ago the patient has been active, essentially spending days in his bed, recliner, kitchen table, without much more activity.     Past Medical History:  Diagnosis Date  . Acute respiratory failure ([place])   . Bilateral lower extremity edema   . Bronchitis   . CHF (congestive heart failure) ([place])   . COPD (chronic obstructive pulmonary disease) ([place])   . DM (diabetes mellitus) ([place])   . HTN (hypertension)   . Hypoxemia   . OSA (obstructive sleep apnea)   . Peripheral vascular disease ([place])   . Tobacco abuse     Patient Active Problem List   Diagnosis Date Noted  . Diabetes ([place]) 05/02/2016  . AKI (acute kidney injury) ([place]) 04/01/2016  . Hypotension 03/31/2016  . Essential hypertension 03/06/2015  . Chronic respiratory failure ([place]) 11/26/2014  . Leg edema, right- Greater than Left leg 07/05/2014  . Peripheral vascular disease, unspecified ([place]) 11/09/2013  . Allergic rhinitis 11/09/2012  . 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 COLON SURGERY         Home Medications    Prior to Admission medications   Medication Sig Start Date End Date Taking? Authorizing  Provider  acetaminophen (TYLENOL) 500 MG tablet Take 500 mg by mouth every 8 (eight) hours as needed for mild pain.     [provider]  atorvastatin (LIPITOR) 20 MG tablet Take 20 mg by mouth daily at 6 PM.  08/19/16   [provider]  Blood Glucose Monitoring Suppl ([place]) w/Device KIT 2 each by Does not apply route 2 (two) times daily. 06/29/16   Renato Shin, MD  furosemide (LASIX) 20 MG tablet Take 1 tablet (20 mg total) by mouth every other day as needed for fluid or edema. Patient taking differently: Take 20 mg by mouth daily as needed for fluid or edema.  04/01/16   Florencia Reasons, MD  glucose blood (ONETOUCH VERIO) test strip Use to check blood sugar 2 times per day. 06/29/16   Renato Shin, MD  insulin NPH-regular Human (NOVOLIN 70/30 RELION) (70-30) 100 UNIT/ML injection Inject 30 Units into the skin daily with breakfast. And syringes 1/day 01/05/17   Renato Shin, MD  lisinopril (PRINIVIL) 10 MG tablet Take 1 tablet (10 mg total) by mouth daily. 04/01/16   Florencia Reasons, MD  [facility] DELICA LANCETS 37S MISC Use to check blood sugar 2 times per day. 06/29/16   Renato Shin, MD  Tiotropium Bromide-Olodaterol (STIOLTO RESPIMAT) 2.5-2.5 MCG/ACT AERS Inhale 2 puffs into the lungs daily. 06/24/16   Collene Gobble, MD  Tiotropium Bromide-Olodaterol (STIOLTO RESPIMAT) 2.5-2.5 MCG/ACT AERS Inhale 2  puffs into the lungs daily. 12/18/16   Parrett, Fonnie Mu, NP    Family History Family History  Problem Relation Age of Onset  . Diabetes Mother   . Hypertension Mother   . Peripheral vascular disease Mother        amputation  . Diabetes Father        Right Leg Amputation-Gangrene  . Hypertension Father   . Peripheral vascular disease Father        amputation  . Cancer Father        Lead Poison-Ca  . Pneumonia Father   . Diabetes Sister   . Hypertension Sister   . Diabetes Daughter   . Hypertension Daughter     Social History Social History  Substance Use Topics   . Smoking status: Former Smoker    Packs/day: 1.50    Years: 50.00    Types: Cigarettes    Quit date: 11/04/2010  . Smokeless tobacco: Never Used  . Alcohol use No     Allergies   Patient has no known allergies.   Review of Systems Review of Systems  Constitutional:       Per HPI, otherwise negative  HENT:       Per HPI, otherwise negative  Respiratory:       Per HPI, otherwise negative  Cardiovascular:       Per HPI, otherwise negative  Gastrointestinal: Negative for vomiting.  Endocrine:       Negative aside from HPI  Genitourinary:       Neg aside from HPI   Musculoskeletal:       Per HPI, otherwise negative  Skin: Negative.   Neurological: Negative for syncope.     Physical Exam Updated Vital Signs BP (!) 172/109 (BP Location: Right Arm)   Pulse (!) 108   Temp 98.3 F (36.8 C) (Oral)   Resp (!) 24   SpO2 92%   Physical Exam  Constitutional: He is oriented to person, place, and time. He appears well-developed. No distress.  HENT:  Head: Normocephalic and atraumatic.  Eyes: Conjunctivae and EOM are normal.  Cardiovascular: Normal rate and regular rhythm.   Pulmonary/Chest: Effort normal. No stridor. No respiratory distress.  Pulse oxygenation 94% room air borderline  Abdominal: He exhibits no distension.  Musculoskeletal: He exhibits no edema.  Patient does move all to be spontaneously, but has pain in his lower back with flexion of either hip, more prominently on the left. No gross deformities.  Neurological: He is alert and oriented to person, place, and time.  Skin: Skin is warm and dry.  Psychiatric: He has a normal mood and affect.  Nursing note and vitals reviewed.    ED Treatments / Results   Radiology Ct Pelvis Wo Contrast  Result Date: 01/09/2017 CLINICAL DATA:  [place] 2 weeks ago with ongoing low back pain. EXAM: CT PELVIS WITHOUT CONTRAST TECHNIQUE: Multidetector CT imaging of the pelvis was performed following the standard protocol  without intravenous contrast. COMPARISON:  None. FINDINGS: Urinary Tract:  Unremarkable. Bowel:  No dilatation. Vascular/Lymphatic: Atherosclerotic calcification noted distal aorta. Reproductive: The prostate gland and seminal vesicles have normal imaging features. Other:  No intraperitoneal free fluid. Musculoskeletal: No fracture. No worrisome lytic or sclerotic osseous abnormality. 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 Facets at L5-S1 also appear fused. IMPRESSION: 1. No acute bony abnormality. 2. 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 Electronically Signed   By: Verda Cumins.D.  On: 01/09/2017 14:14    Procedures Procedures (including critical care time)  Medications Ordered in ED Medications  dexamethasone (DECADRON) injection 10 mg (10 mg Intramuscular Given 01/09/17 1405)  HYDROcodone-acetaminophen (NORCO/VICODIN) 5-325 MG per tablet 1 tablet (1 tablet Oral Given 01/09/17 1405)     Initial Impression / Assessment and Plan / ED Course  I have reviewed the triage vital signs and the nursing notes.  Pertinent labs & imaging results that were available during my care of the patient were reviewed by me and considered in my medical decision making (see chart for details).  After the initial interview are reviewed the patient's chart and documentation following this fall with unremarkable x-ray.  3:20 PM Patient in no distress, sleeping. He awakens easily. I discussed reassuring CT results with him. With no evidence for distal neurovascular compromise, no fractures, patient will have a course of increased analgesia. I advocated for orthopedics follow-up and consideration of physical therapy.   Final Clinical Impressions(s) / ED Diagnoses  Hip pain, bilateral   Carmin Muskrat, MD 01/09/17 1521

## 2017-01-09 NOTE — ED Triage Notes (Signed)
Pt reports ongoing bilateral lower back pain radiating down to bilateral thighs since he fell 2 weeks ago.

## 2017-01-12 ENCOUNTER — Ambulatory Visit: Payer: Medicare Other | Admitting: Endocrinology

## 2017-03-10 ENCOUNTER — Encounter: Payer: Self-pay | Admitting: Endocrinology

## 2017-03-10 ENCOUNTER — Ambulatory Visit (INDEPENDENT_AMBULATORY_CARE_PROVIDER_SITE_OTHER): Payer: Medicare Other | Admitting: Endocrinology

## 2017-03-10 VITALS — BP 170/98 | HR 103 | Wt 180.0 lb

## 2017-03-10 DIAGNOSIS — E1122 Type 2 diabetes mellitus with diabetic chronic kidney disease: Secondary | ICD-10-CM | POA: Diagnosis not present

## 2017-03-10 DIAGNOSIS — Z794 Long term (current) use of insulin: Secondary | ICD-10-CM | POA: Diagnosis not present

## 2017-03-10 DIAGNOSIS — N182 Chronic kidney disease, stage 2 (mild): Secondary | ICD-10-CM

## 2017-03-10 LAB — POCT GLYCOSYLATED HEMOGLOBIN (HGB A1C): HEMOGLOBIN A1C: 8

## 2017-03-10 NOTE — Patient Instructions (Addendum)
check your blood sugar twice a day.  vary the time of day when you check, between before the 3 meals, and at bedtime.  also check if you have symptoms of your blood sugar being too high or too low.  please keep a record of the readings and bring it to your next appointment here (or you can bring the meter itself).  You can write it on any piece of paper.  please call us sooner if your blood sugar goes below 70, or if you have a lot of readings over 200.   please change the insulin to 30 units daily, all with breakfast.   On this type of insulin schedule, you should eat meals on a regular schedule (especially lunch).  If a meal is missed or significantly delayed, your blood sugar could go low.   Please come back for a follow-up appointment in 3 months.

## 2017-03-10 NOTE — Progress Notes (Signed)
Subjective:    Patient ID: Ivan Parks, male    DOB: 01-22-1941, 76 y.o.   MRN: [id number]  HPI Pt returns for f/u of diabetes mellitus: DM type: Insulin-requiring type 2.  Dx'ed: 8413 Complications: polyneuropathy, PAD, and renal insufficiency.   Therapy: insulin since 2016.   DKA: never Severe hypoglycemia: last episode was 2018.  Pancreatitis: never.   Other: he takes QD insulin, due to h/o noncompliance; he takes human insulin, due to cost; he has long duration of action of insulin, prob due to renal insuff; NPH was changed to 70/30, due to AM hypoglycemia and PM hyperglycemia. Interval history: Meter is downloaded today, and the printout is scanned into the record.  All but 1 are checked fasting.  He takes the 70/30 insulin 10 units 3 times a day (just before each meal).   Past Medical History:  Diagnosis Date  . Acute respiratory failure ([place])   . Bilateral lower extremity edema   . Bronchitis   . CHF (congestive heart failure) ([place])   . COPD (chronic obstructive pulmonary disease) ([place])   . DM (diabetes mellitus) ([place])   . HTN (hypertension)   . Hypoxemia   . OSA (obstructive sleep apnea)   . Peripheral vascular disease ([place])   . Tobacco abuse     Past Surgical History:  Procedure Laterality Date  . COLON SURGERY      Social History   Social History  . Marital status: Married    Spouse name: N/A  . Number of children: 7  . Years of education: N/A   Occupational History  . retired    Social History Main Topics  . Smoking status: Former Smoker    Packs/day: 1.50    Years: 50.00    Types: Cigarettes    Quit date: 11/04/2010  . Smokeless tobacco: Never Used  . Alcohol use No  . Drug use: No  . Sexual activity: Not on file   Other Topics Concern  . Not on file   Social History Narrative  . No narrative on file    Current Outpatient Prescriptions on File Prior to Visit  Medication Sig Dispense Refill  . acetaminophen (TYLENOL) 500 MG tablet Take  500 mg by mouth every 8 (eight) hours as needed for mild pain.     Marland Kitchen atorvastatin (LIPITOR) 20 MG tablet Take 20 mg by mouth daily at 6 PM.     . Blood Glucose Monitoring Suppl (ONETOUCH VERIO FLEX SYSTEM) w/Device KIT 2 each by Does not apply route 2 (two) times daily. 1 kit 2  . furosemide (LASIX) 20 MG tablet Take 1 tablet (20 mg total) by mouth every other day as needed for fluid or edema. (Patient taking differently: Take 20 mg by mouth daily as needed for fluid or edema. ) 30 tablet 0  . glucose blood (ONETOUCH VERIO) test strip Use to check blood sugar 2 times per day. 200 each 2  . HYDROcodone-acetaminophen (NORCO/VICODIN) 5-325 MG tablet Take 1 tablet by mouth every 6 (six) hours as needed for severe pain. 15 tablet 0  . insulin NPH-regular Human (NOVOLIN 70/30 RELION) (70-30) 100 UNIT/ML injection Inject 30 Units into the skin daily with breakfast. And syringes 1/day 10 mL 11  . lisinopril (PRINIVIL) 10 MG tablet Take 1 tablet (10 mg total) by mouth daily. 30 tablet 0  . ONETOUCH DELICA LANCETS 24M MISC Use to check blood sugar 2 times per day. 200 each 2  . Tiotropium Bromide-Olodaterol (STIOLTO RESPIMAT) 2.5-2.5  MCG/ACT AERS Inhale 2 puffs into the lungs daily. 3 Inhaler 1  . Tiotropium Bromide-Olodaterol (STIOLTO RESPIMAT) 2.5-2.5 MCG/ACT AERS Inhale 2 puffs into the lungs daily. 3 Inhaler 0   No current facility-administered medications on file prior to visit.     No Known Allergies  Family History  Problem Relation Age of Onset  . Diabetes Mother   . Hypertension Mother   . Peripheral vascular disease Mother        amputation  . Diabetes Father        Right Leg Amputation-Gangrene  . Hypertension Father   . Peripheral vascular disease Father        amputation  . Cancer Father        Lead Poison-Ca  . Pneumonia Father   . Diabetes Sister   . Hypertension Sister   . Diabetes Daughter   . Hypertension Daughter     BP (!) 170/98   Pulse (!) 103   Wt 180 lb (81.6 kg)    SpO2 93%   BMI 30.90 kg/m    Review of Systems He denies hypoglycemia.      Objective:   Physical Exam VITAL SIGNS:  See vs page GENERAL: no distress Pulses: foot pulses are intact bilaterally.   MSK: no deformity of the feet or ankles.  CV: 2+ bilat edema of the legs.   Skin:  no ulcer on the feet or ankles.  Normal temp on the feet and ankles.  There is dense hyperpigmentation on the feet and legs. Neuro: sensation is intact to touch on the feet and ankles, but decreased from normal.    A1c=8.1%    Assessment & Plan:  Insulin-requiring type 2 DM, with renal insuff: the pattern of his cbg's indicates he needs some adjustment in his therapy Severe hypoglycemia: in this context, he is not a candidate for aggressive glycemic control.   Noncompliance with cbg recording and insulin: this complicates the rx of DM.  Patient Instructions  check your blood sugar twice a day.  vary the time of day when you check, between before the 3 meals, and at bedtime.  also check if you have symptoms of your blood sugar being too high or too low.  please keep a record of the readings and bring it to your next appointment here (or you can bring the meter itself).  You can write it on any piece of paper.  please call us sooner if your blood sugar goes below 70, or if you have a lot of readings over 200.   please change the insulin to 30 units daily, all with breakfast.   On this type of insulin schedule, you should eat meals on a regular schedule (especially lunch).  If a meal is missed or significantly delayed, your blood sugar could go low.   Please come back for a follow-up appointment in 3 months.

## 2017-04-19 ENCOUNTER — Ambulatory Visit (INDEPENDENT_AMBULATORY_CARE_PROVIDER_SITE_OTHER): Payer: Medicare Other | Admitting: Emergency Medicine

## 2017-04-19 ENCOUNTER — Encounter: Payer: Self-pay | Admitting: Emergency Medicine

## 2017-04-19 VITALS — BP 136/82 | HR 109 | Ht 65.0 in | Wt 185.6 lb

## 2017-04-19 DIAGNOSIS — J449 Chronic obstructive pulmonary disease, unspecified: Secondary | ICD-10-CM

## 2017-04-19 MED ORDER — TIOTROPIUM BROMIDE-OLODATEROL 2.5-2.5 MCG/ACT IN AERS: 2.0000 | INHALATION_SPRAY | RESPIRATORY_TRACT | 0 refills | Status: AC

## 2017-04-19 NOTE — Assessment & Plan Note (Signed)
Continue your oxygen at 2.5 l/min

## 2017-04-19 NOTE — Progress Notes (Signed)
  Subjective:    Patient ID: Ivan Parks, male    DOB: 1940-06-01, 76 y.o.   MRN: [id number]  HPI 76 yo male seen for initial pulmonary consult 11/04/10 for AECOPD w/ vent depend resp failure.   ROV 04/19/17 --76 year old man with a history of former tobacco, COPD with severe obstruction by spirometry.  Also with allergic rhinitis and chronic hypoxemic respiratory failure.  He uses oxygen at 2.5 L/min, usually wears reliably but didn't wear it today. Currently he is dyspneic. He is on Stiolto, takes it reliably. Rarely uses albuterol. Minimal cough, denies any congestion. No wheeze.  Offered the flu shot - he doesn't want it. No acute exacerbations since last time.     PULMONARY FUNCTON TEST 12/19/2010  FVC 1.71  FEV1 .93  FEV1/FVC 54.4  FVC  % Predicted 45  FEV % Predicted 36  FeF 25-75 .28  FeF 25-75 % Predicted 2.46    Review of Systems  Constitutional: Negative for fever and unexpected weight change.  HENT: Positive for sinus pressure. Negative for congestion, dental problem, ear pain, nosebleeds, postnasal drip, rhinorrhea, sneezing, sore throat and trouble swallowing.   Eyes: Negative for redness and itching.  Respiratory: Positive for shortness of breath. Negative for cough, chest tightness and wheezing.   Cardiovascular: Negative for palpitations and leg swelling.  Gastrointestinal: Negative for nausea and vomiting.  Genitourinary: Negative for dysuria.  Musculoskeletal: Negative for joint swelling.  Skin: Negative for rash.  Neurological: Negative for headaches.  Hematological: Does not bruise/bleed easily.  Psychiatric/Behavioral: Negative for dysphoric mood. The patient is not nervous/anxious.      Objective:   Physical Exam Vitals:   04/19/17 1342  BP: 136/82  Pulse: (!) 109  SpO2: 90%  Weight: 185 lb 9.6 oz (84.2 kg)  Height: 5\' 5"  (1.651 m)   Gen: Pleasant, well-nourished, in no distress,  normal affect  ENT: No lesions,  mouth clear,  oropharynx clear, no  postnasal drip  Neck: No JVD, no stridor  Lungs: No use of accessory muscles, distant clear without rales or rhonchi  Cardiovascular: RRR, heart sounds normal, no murmur or gallops, no peripheral edema  Musculoskeletal: No deformities, no cyanosis or clubbing  Neuro: alert, non focal, insight into his disease is poor  Skin: Warm, no lesions or rashes     Assessment & Plan:  COPD, severe (HCC) Continue Stiolto 2 puffs once a day every day Keep albuterol available to use if needed for shortness of breath, wheezing, chest tightness You would benefit from getting the flu shot Follow with Dr Delton CoombesByrum in 6 months or sooner if you have any problems  Chronic respiratory failure Continue your oxygen at 2.5 l/min  Ivan Pupaobert Lesean Woolverton, MD, PhD 04/19/2017, 2:03 PM [facility] [phone] or if no answer [phone]

## 2017-04-19 NOTE — Patient Instructions (Addendum)
Continue Stiolto 2 puffs once a day every day Keep albuterol available to use if needed for shortness of breath, wheezing, chest tightness Continue your oxygen at 2.5 l/min You would benefit from getting the flu shot Follow with Dr Delton CoombesByrum in 6 months or sooner if you have any problems

## 2017-04-19 NOTE — Assessment & Plan Note (Signed)
Continue Stiolto 2 puffs once a day every day Keep albuterol available to use if needed for shortness of breath, wheezing, chest tightness You would benefit from getting the flu shot Follow with Dr Delton CoombesByrum in 6 months or sooner if you have any problems

## 2017-04-20 ENCOUNTER — Telehealth: Payer: Self-pay | Admitting: Emergency Medicine

## 2017-04-20 NOTE — Telephone Encounter (Signed)
Form has been filled out and signed by RB. This has been faxed back to [employer]. Pt is aware of this information. Nothing further was needed.

## 2017-04-20 NOTE — Telephone Encounter (Signed)
Forms given to [place]. Will route for follow up.

## 2017-06-10 ENCOUNTER — Other Ambulatory Visit: Payer: Medicare Other

## 2017-06-16 ENCOUNTER — Ambulatory Visit: Payer: Medicare Other | Admitting: Endocrinology

## 2017-06-16 ENCOUNTER — Telehealth: Payer: Self-pay

## 2017-06-16 ENCOUNTER — Other Ambulatory Visit: Payer: Medicare Other

## 2017-06-16 NOTE — Telephone Encounter (Signed)
-----   Message from Romero BellingSean Ellison, MD sent at 06/16/2017 11:11 AM EST ----- Regarding: FW: labs This should be for an office visit.  Please add him to today's schedule.   ----- Message ----- From: Adline MangoStone-Elmore, Penny I Sent: 06/16/2017  10:39 AM To: Romero BellingSean Ellison, MD Subject: labs                                           Please put labs in patient coming in today.  Thanks

## 2017-06-16 NOTE — Telephone Encounter (Signed)
Please add pt to this afternoons schedule per note below

## 2017-07-14 ENCOUNTER — Ambulatory Visit: Payer: Medicare Other | Admitting: Endocrinology

## 2017-07-14 ENCOUNTER — Other Ambulatory Visit: Payer: Medicare Other

## 2017-07-19 ENCOUNTER — Telehealth: Payer: Self-pay | Admitting: Emergency Medicine

## 2017-07-19 MED ORDER — TIOTROPIUM BROMIDE-OLODATEROL 2.5-2.5 MCG/ACT IN AERS: 2.0000 | INHALATION_SPRAY | Freq: Every day | RESPIRATORY_TRACT | 0 refills | Status: DC

## 2017-07-19 NOTE — Telephone Encounter (Signed)
Went up to the lobby and spoke with pt's stepdaughter Patsy LagerYolanda verifying the pt's name and DOB.  Gave her a sample of Stiolto for pt.  Nothing further needed at this current time.

## 2017-07-30 ENCOUNTER — Encounter: Payer: Self-pay | Admitting: Pulmonary Disease

## 2017-07-30 ENCOUNTER — Ambulatory Visit (INDEPENDENT_AMBULATORY_CARE_PROVIDER_SITE_OTHER): Payer: Medicare Other | Admitting: Pulmonary Disease

## 2017-07-30 ENCOUNTER — Ambulatory Visit (INDEPENDENT_AMBULATORY_CARE_PROVIDER_SITE_OTHER)
Admission: RE | Admit: 2017-07-30 | Discharge: 2017-07-30 | Disposition: A | Discharge status: Home / Self Care | Payer: Medicare Other | Source: Ambulatory Visit | Attending: Pulmonary Disease | Admitting: Pulmonary Disease

## 2017-07-30 VITALS — BP 144/88 | HR 108 | Temp 98.1°F | Ht 65.0 in | Wt 188.0 lb

## 2017-07-30 DIAGNOSIS — J449 Chronic obstructive pulmonary disease, unspecified: Secondary | ICD-10-CM

## 2017-07-30 DIAGNOSIS — J441 Chronic obstructive pulmonary disease with (acute) exacerbation: Secondary | ICD-10-CM

## 2017-07-30 MED ORDER — ALBUTEROL SULFATE (2.5 MG/3ML) 0.083% IN NEBU: 2.5000 mg | INHALATION_SOLUTION | Freq: Four times a day (QID) | RESPIRATORY_TRACT | 11 refills | Status: DC | PRN

## 2017-07-30 MED ORDER — PREDNISONE 20 MG PO TABS: 20.0000 mg | ORAL_TABLET | Freq: Every day | ORAL | 0 refills | Status: DC

## 2017-07-30 NOTE — Progress Notes (Signed)
Subjective:   PATIENT ID: Ivan Parks GENDER: male DOB: [birth year], MRN: [id number]  Synopsis: Patient of Dr. Lamonte Sakai with COPD   HPI  Chief Complaint  Patient presents with  . Acute Visit    RB pt being treated for severe COPD presents with prod cough with brown mucus X4 days.      [name] says that he has been sick since Monday.  Specifically he says that he has been having sinus congestion increasing shortness of breath and some congestion in his chest.  He says that he has a hard time breathing through his nose.  He continues to use his oxygen.  He notes that he has been noncompliant with his Stiolto lately but he says that this week he is used it more frequently.  He does not have albuterol or nebulizer to use in the home.  In general though he says that he feels like he is coming down with a cold that is now starting to affect his chest.  He denies mucus production.  He denies fevers chills body aches or weight loss.  He says he has had some constipation but he denies diarrhea or nausea or vomiting.  Past Medical History:  Diagnosis Date  . Acute respiratory failure ([place])   . Bilateral lower extremity edema   . Bronchitis   . CHF (congestive heart failure) ([place])   . COPD (chronic obstructive pulmonary disease) ([place])   . DM (diabetes mellitus) ([place])   . HTN (hypertension)   . Hypoxemia   . OSA (obstructive sleep apnea)   . Peripheral vascular disease ([place])   . Tobacco abuse      Family History  Problem Relation Age of Onset  . Diabetes Mother   . Hypertension Mother   . Peripheral vascular disease Mother        amputation  . Diabetes Father        Right Leg Amputation-Gangrene  . Hypertension Father   . Peripheral vascular disease Father        amputation  . Cancer Father        Lead Poison-Ca  . Pneumonia Father   . Diabetes Sister   . Hypertension Sister   . Diabetes Daughter   . Hypertension Daughter      Social History   Socioeconomic History  .  Marital status: Married    Spouse name: Not on file  . Number of children: 7  . Years of education: Not on file  . Highest education level: Not on file  Social Needs  . Financial resource strain: Not on file  . Food insecurity - worry: Not on file  . Food insecurity - inability: Not on file  . Transportation needs - medical: Not on file  . Transportation needs - non-medical: Not on file  Occupational History  . Occupation: retired  Tobacco Use  . Smoking status: Former Smoker    Packs/day: 1.50    Years: 50.00    Pack years: 75.00    Types: Cigarettes    Last attempt to quit: 11/04/2010    Years since quitting: 6.7  . Smokeless tobacco: Never Used  Substance and Sexual Activity  . Alcohol use: No    Alcohol/week: 0.0 oz  . Drug use: No  . Sexual activity: Not on file  Other Topics Concern  . Not on file  Social History Narrative  . Not on file     No Known Allergies   Outpatient Medications Prior to Visit  Medication Sig Dispense Refill  . acetaminophen (TYLENOL) 500 MG tablet Take 500 mg by mouth every 8 (eight) hours as needed for mild pain.     Marland Kitchen atorvastatin (LIPITOR) 20 MG tablet Take 20 mg by mouth daily at 6 PM.     . Blood Glucose Monitoring Suppl (ONETOUCH VERIO FLEX SYSTEM) w/Device KIT 2 each by Does not apply route 2 (two) times daily. 1 kit 2  . furosemide (LASIX) 20 MG tablet Take 1 tablet (20 mg total) by mouth every other day as needed for fluid or edema. (Patient taking differently: Take 20 mg by mouth daily as needed for fluid or edema. ) 30 tablet 0  . glucose blood (ONETOUCH VERIO) test strip Use to check blood sugar 2 times per day. 200 each 2  . HYDROcodone-acetaminophen (NORCO/VICODIN) 5-325 MG tablet Take 1 tablet by mouth every 6 (six) hours as needed for severe pain. 15 tablet 0  . insulin NPH-regular Human (NOVOLIN 70/30 RELION) (70-30) 100 UNIT/ML injection Inject 30 Units into the skin daily with breakfast. And syringes 1/day 10 mL 11  .  lisinopril (PRINIVIL) 10 MG tablet Take 1 tablet (10 mg total) by mouth daily. 30 tablet 0  . ONETOUCH DELICA LANCETS 50Y MISC Use to check blood sugar 2 times per day. 200 each 2  . Tiotropium Bromide-Olodaterol (STIOLTO RESPIMAT) 2.5-2.5 MCG/ACT AERS Inhale 2 puffs into the lungs daily. 3 Inhaler 1  . Tiotropium Bromide-Olodaterol (STIOLTO RESPIMAT) 2.5-2.5 MCG/ACT AERS Inhale 2 puffs into the lungs daily. 1 Inhaler 0   No facility-administered medications prior to visit.     Review of Systems  Constitutional: Positive for malaise/fatigue. Negative for chills.  HENT: Positive for congestion and sinus pain.   Respiratory: Positive for shortness of breath and wheezing. Negative for cough and sputum production.   Cardiovascular: Negative for palpitations, claudication and leg swelling.  Neurological: Negative for weakness.      Objective:  Physical Exam   Vitals:   07/30/17 1133  BP: (!) 144/88  Pulse: (!) 108  Temp: 98.1 F (36.7 C)  TempSrc: Oral  SpO2: 95%  Weight: 188 lb (85.3 kg)  Height: _0  (1.651 m)   3L [place]  Gen: chronically ill appearing HENT: OP clear, TM's clear, neck supple PULM: Wheezing bilaterally B, normal percussion CV: RRR, no mgr, trace edema GI: BS+, soft, nontender Derm: no cyanosis or rash Psyche: normal mood and affect   CBC    Component Value Date/Time   WBC 14.1 (H) 12/29/2016 2120   RBC 5.21 12/29/2016 2120   HGB 14.6 12/29/2016 2120   HCT 44.0 12/29/2016 2120   PLT 196 12/29/2016 2120   MCV 84.5 12/29/2016 2120   MCH 28.0 12/29/2016 2120   MCHC 33.2 12/29/2016 2120   RDW 12.4 12/29/2016 2120   LYMPHSABS 1.4 12/29/2016 2120   MONOABS 0.6 12/29/2016 2120   EOSABS 0.0 12/29/2016 2120   BASOSABS 0.0 12/29/2016 2120     Chest imaging:  PFT: PULMONARY FUNCTON TEST 12/19/2010  FVC 1.71  FEV1 .93  FEV1/FVC 54.4  FVC  % Predicted 45  FEV % Predicted 36  FeF 25-75 .28  FeF 25-75 % Predicted 2.46     Labs:  Path:  Echo:  Heart Catheterization:   Records from his visit with Dr. Lamonte Sakai in 2018 reviewed where he was treated with Stiolto    Assessment & Plan:   COPD, severe ([place]) - Plan: Ambulatory Referral for DME, DG Chest 2 View  COPD with  acute exacerbation ([place])  Discussion: He has an exacerbation of COPD associated with likely a viral illness.  He is wheezing on physical exam today though he does not appear critically ill.  He should be able to be managed as an outpatient.  I doubt something like pneumonia but he has a difficult time providing a detailed history so I think it is reasonable to get a chest x-ray.  I do not think he needs antibiotics at this time.   Plan: COPD with acute exacerbation: Take prednisone 20 mg daily times 5 days Use albuterol nebulized 3 times a day for the next 3-5 days, we will prescribe this medicine and a machine.  After 3-5 days you can use it as needed Takes the also 2 puffs daily no matter how you feel Use Mucinex to help break up mucus in your chest  We will see you back as previously arranged by Dr. Lamonte Sakai    Current Outpatient Medications:  .  acetaminophen (TYLENOL) 500 MG tablet, Take 500 mg by mouth every 8 (eight) hours as needed for mild pain. , Disp: , Rfl:  .  atorvastatin (LIPITOR) 20 MG tablet, Take 20 mg by mouth daily at 6 PM. , Disp: , Rfl:  .  Blood Glucose Monitoring Suppl (ONETOUCH VERIO FLEX SYSTEM) w/Device KIT, 2 each by Does not apply route 2 (two) times daily., Disp: 1 kit, Rfl: 2 .  furosemide (LASIX) 20 MG tablet, Take 1 tablet (20 mg total) by mouth every other day as needed for fluid or edema. (Patient taking differently: Take 20 mg by mouth daily as needed for fluid or edema. ), Disp: 30 tablet, Rfl: 0 .  glucose blood (ONETOUCH VERIO) test strip, Use to check blood sugar 2 times per day., Disp: 200 each, Rfl: 2 .  HYDROcodone-acetaminophen (NORCO/VICODIN) 5-325 MG tablet, Take 1 tablet by mouth every 6 (six)  hours as needed for severe pain., Disp: 15 tablet, Rfl: 0 .  insulin NPH-regular Human (NOVOLIN 70/30 RELION) (70-30) 100 UNIT/ML injection, Inject 30 Units into the skin daily with breakfast. And syringes 1/day, Disp: 10 mL, Rfl: 11 .  lisinopril (PRINIVIL) 10 MG tablet, Take 1 tablet (10 mg total) by mouth daily., Disp: 30 tablet, Rfl: 0 .  ONETOUCH DELICA LANCETS 26J MISC, Use to check blood sugar 2 times per day., Disp: 200 each, Rfl: 2 .  Tiotropium Bromide-Olodaterol (STIOLTO RESPIMAT) 2.5-2.5 MCG/ACT AERS, Inhale 2 puffs into the lungs daily., Disp: 3 Inhaler, Rfl: 1

## 2017-07-30 NOTE — Patient Instructions (Signed)
COPD with acute exacerbation: Take prednisone 20 mg daily times 5 days Use albuterol nebulized 3 times a day for the next 3-5 days, we will prescribe this medicine and a machine.  After 3-5 days you can use it as needed Takes the also 2 puffs daily no matter how you feel Use Mucinex to help break up mucus in your chest

## 2017-08-11 ENCOUNTER — Ambulatory Visit: Payer: Medicare Other | Admitting: Endocrinology

## 2017-08-20 ENCOUNTER — Ambulatory Visit: Payer: Medicare Other | Admitting: Endocrinology

## 2017-08-25 ENCOUNTER — Encounter: Payer: Self-pay | Admitting: Endocrinology

## 2017-08-25 ENCOUNTER — Ambulatory Visit (INDEPENDENT_AMBULATORY_CARE_PROVIDER_SITE_OTHER): Payer: Medicare Other | Admitting: Endocrinology

## 2017-08-25 VITALS — BP 182/98 | HR 101 | Wt 188.8 lb

## 2017-08-25 DIAGNOSIS — N182 Chronic kidney disease, stage 2 (mild): Secondary | ICD-10-CM | POA: Diagnosis not present

## 2017-08-25 DIAGNOSIS — E1122 Type 2 diabetes mellitus with diabetic chronic kidney disease: Secondary | ICD-10-CM | POA: Diagnosis not present

## 2017-08-25 DIAGNOSIS — Z794 Long term (current) use of insulin: Secondary | ICD-10-CM

## 2017-08-25 LAB — POCT GLYCOSYLATED HEMOGLOBIN (HGB A1C): Hemoglobin A1C: 11.6

## 2017-08-25 MED ORDER — INSULIN NPH ISOPHANE & REGULAR (70-30) 100 UNIT/ML ~~LOC~~ SUSP: 40.0000 [IU] | Freq: Every day | SUBCUTANEOUS | 11 refills | Status: DC

## 2017-08-25 NOTE — Patient Instructions (Addendum)
check your blood sugar twice a day.  vary the time of day when you check, between before the 3 meals, and at bedtime.  also check if you have symptoms of your blood sugar being too high or too low.  please keep a record of the readings and bring it to your next appointment here (or you can bring the meter itself).  You can write it on any piece of paper.  please call us sooner if your blood sugar goes below 70, or if you have a lot of readings over 200.   please increase the insulin to 40 units daily, all with breakfast.   On this type of insulin schedule, you should eat meals on a regular schedule (especially lunch).  If a meal is missed or significantly delayed, your blood sugar could go low.   Please come back for a follow-up appointment in 1 month.

## 2017-08-25 NOTE — Progress Notes (Signed)
Subjective:    Patient ID: Ivan Parks, male    DOB: 06-21-1940, 77 y.o.   MRN: [id number]  HPI Pt returns for f/u of diabetes mellitus: DM type: Insulin-requiring type 2.  Dx'ed: 4098 Complications: polyneuropathy, PAD, and renal insufficiency.   Therapy: insulin since 2016.   DKA: never Severe hypoglycemia: last episode was 2018.   Pancreatitis: never.   Other: he takes QD insulin, due to h/o noncompliance; he takes human insulin, due to cost; he has long duration of action of insulin, prob due to renal insuff; NPH was changed to 70/30, due to AM hypoglycemia and PM hyperglycemia.  Interval history: pt says cbg's are in the 200's.  He has finished a course of prednisone for AB.   Past Medical History:  Diagnosis Date  . Acute respiratory failure ([place])   . Bilateral lower extremity edema   . Bronchitis   . CHF (congestive heart failure) ([place])   . COPD (chronic obstructive pulmonary disease) ([place])   . DM (diabetes mellitus) ([place])   . HTN (hypertension)   . Hypoxemia   . OSA (obstructive sleep apnea)   . Peripheral vascular disease ([place])   . Tobacco abuse     Past Surgical History:  Procedure Laterality Date  . COLON SURGERY      Social History   Socioeconomic History  . Marital status: Married    Spouse name: Not on file  . Number of children: 7  . Years of education: Not on file  . Highest education level: Not on file  Occupational History  . Occupation: retired  Scientific laboratory technician  . Financial resource strain: Not on file  . Food insecurity:    Worry: Not on file    Inability: Not on file  . Transportation needs:    Medical: Not on file    Non-medical: Not on file  Tobacco Use  . Smoking status: Former Smoker    Packs/day: 1.50    Years: 50.00    Pack years: 75.00    Types: Cigarettes    Last attempt to quit: 11/04/2010    Years since quitting: 6.8  . Smokeless tobacco: Never Used  Substance and Sexual Activity  . Alcohol use: No    Alcohol/week: 0.0 oz   . Drug use: No  . Sexual activity: Not on file  Lifestyle  . Physical activity:    Days per week: Not on file    Minutes per session: Not on file  . Stress: Not on file  Relationships  . Social connections:    Talks on phone: Not on file    Gets together: Not on file    Attends religious service: Not on file    Active member of club or organization: Not on file    Attends meetings of clubs or organizations: Not on file    Relationship status: Not on file  . Intimate partner violence:    Fear of current or ex partner: Not on file    Emotionally abused: Not on file    Physically abused: Not on file    Forced sexual activity: Not on file  Other Topics Concern  . Not on file  Social History Narrative  . Not on file    Current Outpatient Medications on File Prior to Visit  Medication Sig Dispense Refill  . acetaminophen (TYLENOL) 500 MG tablet Take 500 mg by mouth every 8 (eight) hours as needed for mild pain.     Marland Kitchen albuterol (PROVENTIL) (2.5 MG/3ML)  0.083% nebulizer solution Take 3 mLs (2.5 mg total) by nebulization every 6 (six) hours as needed for wheezing or shortness of breath. 360 mL 11  . atorvastatin (LIPITOR) 20 MG tablet Take 20 mg by mouth daily at 6 PM.     . Blood Glucose Monitoring Suppl (ONETOUCH VERIO FLEX SYSTEM) w/Device KIT 2 each by Does not apply route 2 (two) times daily. 1 kit 2  . furosemide (LASIX) 20 MG tablet Take 1 tablet (20 mg total) by mouth every other day as needed for fluid or edema. (Patient taking differently: Take 20 mg by mouth daily as needed for fluid or edema. ) 30 tablet 0  . glucose blood (ONETOUCH VERIO) test strip Use to check blood sugar 2 times per day. 200 each 2  . HYDROcodone-acetaminophen (NORCO/VICODIN) 5-325 MG tablet Take 1 tablet by mouth every 6 (six) hours as needed for severe pain. 15 tablet 0  . lisinopril (PRINIVIL) 10 MG tablet Take 1 tablet (10 mg total) by mouth daily. 30 tablet 0  . ONETOUCH DELICA LANCETS 59D MISC Use  to check blood sugar 2 times per day. 200 each 2  . Tiotropium Bromide-Olodaterol (STIOLTO RESPIMAT) 2.5-2.5 MCG/ACT AERS Inhale 2 puffs into the lungs daily. 3 Inhaler 1   No current facility-administered medications on file prior to visit.     No Known Allergies  Family History  Problem Relation Age of Onset  . Diabetes Mother   . Hypertension Mother   . Peripheral vascular disease Mother        amputation  . Diabetes Father        Right Leg Amputation-Gangrene  . Hypertension Father   . Peripheral vascular disease Father        amputation  . Cancer Father        Lead Poison-Ca  . Pneumonia Father   . Diabetes Sister   . Hypertension Sister   . Diabetes Daughter   . Hypertension Daughter     BP (!) 182/98 (BP Location: Left Arm, Patient Position: Sitting, Cuff Size: Normal)   Pulse (!) 101   Wt 188 lb 12.8 oz (85.6 kg)   SpO2 91%   BMI 31.42 kg/m    Review of Systems He denies hypoglycemia    Objective:   Physical Exam VITAL SIGNS:  See vs page GENERAL: no distress Pulses: foot pulses are intact bilaterally.   MSK: no deformity of the feet or ankles.  CV: 2+ bilat edema of the legs.   Skin:  no ulcer on the feet or ankles.  Normal temp on the feet and ankles.  There is dense hyperpigmentation on the feet and legs.   Neuro: sensation is intact to touch on the feet and ankles, but decreased from normal.     A1c=11.6%    Assessment & Plan:  Insulin-requiring type 2 DM, with PAD.  He needs increased rx.  AB: new.  Prednisone is affecting a1c, so we'll increase by just 10 units.  Patient Instructions  check your blood sugar twice a day.  vary the time of day when you check, between before the 3 meals, and at bedtime.  also check if you have symptoms of your blood sugar being too high or too low.  please keep a record of the readings and bring it to your next appointment here (or you can bring the meter itself).  You can write it on any piece of paper.  please  call us sooner if your blood sugar goes  below 70, or if you have a lot of readings over 200.   please increase the insulin to 40 units daily, all with breakfast.   On this type of insulin schedule, you should eat meals on a regular schedule (especially lunch).  If a meal is missed or significantly delayed, your blood sugar could go low.   Please come back for a follow-up appointment in 1 month.

## 2017-08-29 ENCOUNTER — Emergency Department (HOSPITAL_COMMUNITY): Payer: Medicare Other

## 2017-08-29 ENCOUNTER — Emergency Department (HOSPITAL_COMMUNITY)
Admission: EM | Admit: 2017-08-29 | Discharge: 2017-08-29 | Disposition: A | Discharge status: Home / Self Care | Payer: Medicare Other | Attending: Emergency Medicine | Admitting: Emergency Medicine

## 2017-08-29 ENCOUNTER — Encounter (HOSPITAL_COMMUNITY): Payer: Self-pay | Admitting: Emergency Medicine

## 2017-08-29 ENCOUNTER — Other Ambulatory Visit: Payer: Self-pay

## 2017-08-29 DIAGNOSIS — Z79899 Other long term (current) drug therapy: Secondary | ICD-10-CM | POA: Insufficient documentation

## 2017-08-29 DIAGNOSIS — W010XXA Fall on same level from slipping, tripping and stumbling without subsequent striking against object, initial encounter: Secondary | ICD-10-CM | POA: Diagnosis not present

## 2017-08-29 DIAGNOSIS — Y9389 Activity, other specified: Secondary | ICD-10-CM | POA: Insufficient documentation

## 2017-08-29 DIAGNOSIS — J449 Chronic obstructive pulmonary disease, unspecified: Secondary | ICD-10-CM | POA: Insufficient documentation

## 2017-08-29 DIAGNOSIS — E119 Type 2 diabetes mellitus without complications: Secondary | ICD-10-CM | POA: Diagnosis not present

## 2017-08-29 DIAGNOSIS — Z87891 Personal history of nicotine dependence: Secondary | ICD-10-CM | POA: Diagnosis not present

## 2017-08-29 DIAGNOSIS — Y998 Other external cause status: Secondary | ICD-10-CM | POA: Insufficient documentation

## 2017-08-29 DIAGNOSIS — I110 Hypertensive heart disease with heart failure: Secondary | ICD-10-CM | POA: Diagnosis not present

## 2017-08-29 DIAGNOSIS — S61209A Unspecified open wound of unspecified finger without damage to nail, initial encounter: Secondary | ICD-10-CM

## 2017-08-29 DIAGNOSIS — Z794 Long term (current) use of insulin: Secondary | ICD-10-CM | POA: Diagnosis not present

## 2017-08-29 DIAGNOSIS — Y92009 Unspecified place in unspecified non-institutional (private) residence as the place of occurrence of the external cause: Secondary | ICD-10-CM | POA: Diagnosis not present

## 2017-08-29 DIAGNOSIS — I509 Heart failure, unspecified: Secondary | ICD-10-CM | POA: Diagnosis not present

## 2017-08-29 DIAGNOSIS — S61205A Unspecified open wound of left ring finger without damage to nail, initial encounter: Secondary | ICD-10-CM | POA: Diagnosis not present

## 2017-08-29 MED ORDER — LIDOCAINE HCL 2 % IJ SOLN
10.0000 mL | Freq: Once | INTRAMUSCULAR | Status: AC
  Administered 2017-08-29: 200 mg via INTRADERMAL
  Filled 2017-08-29: qty 20

## 2017-08-29 MED ORDER — CLINDAMYCIN HCL 300 MG PO CAPS
300.0000 mg | ORAL_CAPSULE | Freq: Once | ORAL | Status: AC
  Administered 2017-08-29: 300 mg via ORAL
  Filled 2017-08-29: qty 1

## 2017-08-29 MED ORDER — CLINDAMYCIN HCL 150 MG PO CAPS: 300.0000 mg | ORAL_CAPSULE | Freq: Three times a day (TID) | ORAL | 0 refills | Status: AC

## 2017-08-29 NOTE — ED Notes (Signed)
Vinson Mosellealled Brenda, daughter, to come pick up pt

## 2017-08-29 NOTE — Discharge Instructions (Signed)
1. Medications: Please take all of your antibiotics until finished!   You may develop abdominal discomfort or diarrhea from the antibiotic.  You may help offset this with probiotics which you can buy or get in yogurt. Do not eat  or take the probiotics until 2 hours after your antibiotic.  2. Treatment: rest, drink plenty of fluids, use warm compresses, flush with warm water and soap several times per day. DO NOT push on wound.  3. Follow Up: Please followup with your primary doctor ior the Hand Doctor above in 2-3 days for discussion of your diagnoses and further evaluation after today's visit; Please return to the ER for fevers, chills, nausea, vomiting, redness to the wound or other signs of infection

## 2017-08-29 NOTE — ED Triage Notes (Signed)
Pt reports that he fell Sunday night/monday morning. Reports thought something stuck in left ring finger so Monday night got tweezers and dug in it but couldn't get anything out of it. Reports that has swelling and pus drainage out of it.

## 2017-08-29 NOTE — ED Provider Notes (Signed)
[facility] DEPT Provider Note   CSN: [id number] Arrival date & time: 08/29/17  1355     History   Chief Complaint Chief Complaint  Patient presents with  . Finger Injury  . finger swelling    HPI Ivan Parks is a 77 y.o. male with history of CHF, COPD, DM, OSA, peripheral vascular disease presents for evaluation of acute onset, progressively worsening left fourth digit wound.  He states that on Sunday 1 week ago he sustained a mechanical fall in which he tripped over a rug at home.  He states that he thinks he landed on his left shoulder.  He denies head injury or loss of consciousness.  He states he thinks a foreign body was inserted into the radial aspect of the proximal left fourth digit but was unable to remove the foreign body.  He is unsure what the foreign body is or if it is even truly they are but he did notice development of a wound to the proximal left fourth digit as well as swelling.  He states that a few days ago he used a pair of tweezers to try to remove the possible foreign body but was unsuccessful.  Today he noted a small amount of clear draining from the wound.  He denies fevers, nausea, vomiting, chest pain, difficulty breathing, or abdominal pain.  He denies any significant pain from the wound.  He denies numbness or tingling.  No aggravating or alleviating factors noted.  His tetanus is up-to-date.  The history is provided by the patient.    Past Medical History:  Diagnosis Date  . Acute respiratory failure ([place])   . Bilateral lower extremity edema   . Bronchitis   . CHF (congestive heart failure) ([place])   . COPD (chronic obstructive pulmonary disease) ([place])   . DM (diabetes mellitus) ([place])   . HTN (hypertension)   . Hypoxemia   . OSA (obstructive sleep apnea)   . Peripheral vascular disease ([place])   . Tobacco abuse     Patient Active Problem List   Diagnosis Date Noted  . Diabetes ([place]) 05/02/2016  . AKI (acute kidney  injury) ([place]) 04/01/2016  . Hypotension 03/31/2016  . Essential hypertension 03/06/2015  . Chronic respiratory failure ([place]) 11/26/2014  . Leg edema, right- Greater than Left leg 07/05/2014  . Peripheral vascular disease, unspecified ([place]) 11/09/2013  . Allergic rhinitis 11/09/2012  . COPD, severe ([place]) 11/19/2010    Past Surgical History:  Procedure Laterality Date  . COLON SURGERY          Home Medications    Prior to Admission medications   Medication Sig Start Date End Date Taking? Authorizing Provider  acetaminophen (TYLENOL) 500 MG tablet Take 500 mg by mouth every 8 (eight) hours as needed for mild pain.     [provider]  albuterol (PROVENTIL) (2.5 MG/3ML) 0.083% nebulizer solution Take 3 mLs (2.5 mg total) by nebulization every 6 (six) hours as needed for wheezing or shortness of breath. 07/30/17   Juanito Doom, MD  atorvastatin (LIPITOR) 20 MG tablet Take 20 mg by mouth daily at 6 PM.  08/19/16   [provider]  Blood Glucose Monitoring Suppl ([place]) w/Device KIT 2 each by Does not apply route 2 (two) times daily. 06/29/16   Renato Shin, MD  clindamycin (CLEOCIN) 150 MG capsule Take 2 capsules (300 mg total) by mouth 3 (three) times daily for 7 days. 08/29/17 09/05/17  Renita Papa,  PA-C  furosemide (LASIX) 20 MG tablet Take 1 tablet (20 mg total) by mouth every other day as needed for fluid or edema. Patient taking differently: Take 20 mg by mouth daily as needed for fluid or edema.  04/01/16   Florencia Reasons, MD  glucose blood (ONETOUCH VERIO) test strip Use to check blood sugar 2 times per day. 06/29/16   Renato Shin, MD  HYDROcodone-acetaminophen (NORCO/VICODIN) 5-325 MG tablet Take 1 tablet by mouth every 6 (six) hours as needed for severe pain. 01/09/17   Carmin Muskrat, MD  insulin NPH-regular Human (NOVOLIN 70/30 RELION) (70-30) 100 UNIT/ML injection Inject 40 Units into the skin daily with breakfast. And syringes 1/day 08/25/17    Renato Shin, MD  lisinopril (PRINIVIL) 10 MG tablet Take 1 tablet (10 mg total) by mouth daily. 04/01/16   Florencia Reasons, MD  [facility] DELICA LANCETS 56L MISC Use to check blood sugar 2 times per day. 06/29/16   Renato Shin, MD  Tiotropium Bromide-Olodaterol (STIOLTO RESPIMAT) 2.5-2.5 MCG/ACT AERS Inhale 2 puffs into the lungs daily. 06/24/16   Collene Gobble, MD    Family History Family History  Problem Relation Age of Onset  . Diabetes Mother   . Hypertension Mother   . Peripheral vascular disease Mother        amputation  . Diabetes Father        Right Leg Amputation-Gangrene  . Hypertension Father   . Peripheral vascular disease Father        amputation  . Cancer Father        Lead Poison-Ca  . Pneumonia Father   . Diabetes Sister   . Hypertension Sister   . Diabetes Daughter   . Hypertension Daughter     Social History Social History   Tobacco Use  . Smoking status: Former Smoker    Packs/day: 1.50    Years: 50.00    Pack years: 75.00    Types: Cigarettes    Last attempt to quit: 11/04/2010    Years since quitting: 6.8  . Smokeless tobacco: Never Used  Substance Use Topics  . Alcohol use: No    Alcohol/week: 0.0 oz  . Drug use: No     Allergies   Patient has no known allergies.   Review of Systems Review of Systems  Constitutional: Negative for chills and fever.  Respiratory: Negative for shortness of breath.   Cardiovascular: Negative for chest pain.  Gastrointestinal: Negative for nausea and vomiting.  Skin: Positive for wound.  Neurological: Negative for weakness and numbness.  All other systems reviewed and are negative.    Physical Exam Updated Vital Signs BP (!) 144/76 (BP Location: Right Arm)   Pulse 90   Temp 97.6 F (36.4 C) (Oral)   Resp 20   SpO2 94%   Physical Exam  Constitutional: He is oriented to person, place, and time. He appears well-developed and well-nourished. No distress.  HENT:  Head: Normocephalic and atraumatic.    Eyes: Conjunctivae are normal. Right eye exhibits no discharge. Left eye exhibits no discharge.  Neck: No JVD present. No tracheal deviation present.  Cardiovascular: Normal rate and intact distal pulses.  2+ radial pulses bilaterally  Pulmonary/Chest: Effort normal.  Resting comfortably in chair, wearing nasal cannula on 2.5 L/min portable oxygen.  Patient states this is his baseline.  Speaking in full sentences without difficulty.  Abdominal: He exhibits no distension.  Musculoskeletal: Normal range of motion. He exhibits no edema.  See attached images.  Wound to the  left proximal fourth digit along the radial aspect of the digit.  No active drainage.  Swelling to the palmar aspect of the digit.  No erythema or warmth.  Very minimal tenderness to palpation at the very base of the finger.  5/5 strength of the wrist and digits with flexion and extension against resistance.  No crepitus noted.  Neurological: He is alert and oriented to person, place, and time. No sensory deficit. He exhibits normal muscle tone.  Fluent speech, no facial droop, sensation intact to soft touch of bilateral hands.  Skin: Skin is warm and dry. No erythema.  Psychiatric: He has a normal mood and affect. His behavior is normal.  Nursing note and vitals reviewed.      ED Treatments / Results  Labs (all labs ordered are listed, but only abnormal results are displayed) Labs Reviewed - No data to display  EKG None  Radiology Dg Finger Ring Left  Result Date: 08/29/2017 CLINICAL DATA:  [place] 1 week ago, something stuck in LEFT ring finger, tried to get it out with tweezers but was unable to remove anything, now with swelling and purulent drainage EXAM: LEFT RING FINGER 2+V COMPARISON:  None FINDINGS: Osseous mineralization normal. Slight joint space narrowing and spur formation at DIP joint. Remaining joint spaces preserved. No acute fracture, dislocation, or bone destruction. No radiopaque foreign body or soft  tissue gas identified. IMPRESSION: Mild degenerative changes at the DIP joint. No acute abnormalities or definite radiopaque foreign body. Electronically Signed   By: Lavonia Dana M.D.   On: 08/29/2017 15:05    Procedures .Marland KitchenIncision and Drainage Date/Time: 08/29/2017 6:00 PM Performed by: Renita Papa, PA-C Authorized by: Renita Papa, PA-C   Consent:    Consent obtained:  Verbal   Consent given by:  Patient   Risks discussed:  Bleeding, incomplete drainage, infection and pain Location:    Type:  Abscess   Location:  Upper extremity   Upper extremity location:  Finger   Finger location:  L ring finger Pre-procedure details:    Skin preparation:  Betadine Anesthesia (see MAR for exact dosages):    Anesthesia method:  Local infiltration   Local anesthetic:  Lidocaine 2% w/o epi Procedure type:    Complexity:  Simple Procedure details:    Incision types:  Stab incision   Incision depth:  Dermal   Scalpel blade:  11   Wound management:  Probed and deloculated and irrigated with saline   Drainage:  Bloody   Drainage amount:  Scant   Wound treatment:  Wound left open Post-procedure details:    Patient tolerance of procedure:  Tolerated well, no immediate complications   (including critical care time)  Medications Ordered in ED Medications  lidocaine (XYLOCAINE) 2 % (with pres) injection 200 mg (has no administration in time range)  clindamycin (CLEOCIN) capsule 300 mg (has no administration in time range)     Initial Impression / Assessment and Plan / ED Course  I have reviewed the triage vital signs and the nursing notes.  Pertinent labs & imaging results that were available during my care of the patient were reviewed by me and considered in my medical decision making (see chart for details).     Patient presents with wound to the proximal left fourth digit.  He is afebrile, vital signs are stable.  He is nontoxic in appearance.  Wound is not particularly tender to  palpation.  No foreign bodies noted.  Radiographs show no radiopaque foreign body,  no acute osseous abnormalities.  He is neurovascularly intact. Attempted I&D of the area. Base of wound was visualized in a bloodless field. No purulent drainage. Tetanus up to date. No evidence of foreign body, tenosynovitis, necrotizing fasciitis, septic joint, gout, or osteomyelitis. Wound was dressed. Will discharge on a course of clindamycin with follow up with hand surgery on an outpatient basis.  Discussed wound care.  Discussed strict ED return precautions. Pt verbalized understanding of and agreement with plan and is safe for discharge home at this time. Patient seen and evaluated by Dr. Ellender Hose who agrees with assessment and plan at this time.   Final Clinical Impressions(s) / ED Diagnoses   Final diagnoses:  Open wound of finger, initial encounter    ED Discharge Orders        Ordered    clindamycin (CLEOCIN) 150 MG capsule  3 times daily     08/29/17 1755       Renita Papa, PA-C 08/29/17 1806    Duffy Bruce, MD 08/30/17 [phone]

## 2017-08-29 NOTE — ED Notes (Addendum)
Steward DroneBrenda daughter, [phone] when pt discharged for ride home.  Elease Hashimotolisha granddaughter, [phone] call if cant get a hold of daughter

## 2017-09-02 ENCOUNTER — Telehealth: Payer: Self-pay

## 2017-09-08 NOTE — Telephone Encounter (Signed)
Error

## 2017-09-10 ENCOUNTER — Other Ambulatory Visit: Payer: Self-pay

## 2017-09-10 MED ORDER — INSULIN NPH ISOPHANE & REGULAR (70-30) 100 UNIT/ML ~~LOC~~ SUSP: SUBCUTANEOUS | 11 refills | Status: DC

## 2017-09-28 ENCOUNTER — Ambulatory Visit (INDEPENDENT_AMBULATORY_CARE_PROVIDER_SITE_OTHER): Payer: Medicare Other | Admitting: Endocrinology

## 2017-09-28 ENCOUNTER — Encounter: Payer: Self-pay | Admitting: Endocrinology

## 2017-09-28 VITALS — BP 140/80 | HR 87 | Ht 65.0 in | Wt 186.0 lb

## 2017-09-28 DIAGNOSIS — E1122 Type 2 diabetes mellitus with diabetic chronic kidney disease: Secondary | ICD-10-CM | POA: Diagnosis not present

## 2017-09-28 DIAGNOSIS — Z794 Long term (current) use of insulin: Secondary | ICD-10-CM | POA: Diagnosis not present

## 2017-09-28 DIAGNOSIS — N182 Chronic kidney disease, stage 2 (mild): Secondary | ICD-10-CM | POA: Diagnosis not present

## 2017-09-28 MED ORDER — INSULIN NPH ISOPHANE & REGULAR (70-30) 100 UNIT/ML ~~LOC~~ SUSP: 55.0000 [IU] | Freq: Every day | SUBCUTANEOUS | 11 refills | Status: DC

## 2017-09-28 NOTE — Patient Instructions (Addendum)
check your blood sugar twice a day.  vary the time of day when you check, between before the 3 meals, and at bedtime.  also check if you have symptoms of your blood sugar being too high or too low.  please keep a record of the readings and bring it to your next appointment here (or you can bring the meter itself).  You can write it on any piece of paper.  please call us sooner if your blood sugar goes below 70, or if you have a lot of readings over 200.   please increase the insulin to 55 units daily, all with breakfast.   On this type of insulin schedule, you should eat meals on a regular schedule (especially lunch).  If a meal is missed or significantly delayed, your blood sugar could go low.   Please come back for a follow-up appointment in 6 weeks.

## 2017-09-28 NOTE — Progress Notes (Signed)
Subjective:    Patient ID: Ivan Parks, male    DOB: 1940/09/11, 77 y.o.   MRN: [id number]  HPI Pt returns for f/u of diabetes mellitus: DM type: Insulin-requiring type 2.  Dx'ed: 0712 Complications: polyneuropathy, PAD, and renal insufficiency.   Therapy: insulin since 2016.   DKA: never Severe hypoglycemia: last episode was 2018.   Pancreatitis: never.   Other: he takes QD insulin, due to h/o noncompliance; he takes human insulin, due to cost; he has long duration of action of insulin, prob due to renal insuff; NPH was changed to 70/30, due to AM hypoglycemia and PM hyperglycemia.  Interval history: Meter is downloaded today, and the printout is scanned into the record.  cbg varies from 250-300's.  It is in general higher as the day goes on.  He is here with dtr, but he takes his own insulin.   Past Medical History:  Diagnosis Date  . Acute respiratory failure ([place])   . Bilateral lower extremity edema   . Bronchitis   . CHF (congestive heart failure) ([place])   . COPD (chronic obstructive pulmonary disease) ([place])   . DM (diabetes mellitus) ([place])   . HTN (hypertension)   . Hypoxemia   . OSA (obstructive sleep apnea)   . Peripheral vascular disease ([place])   . Tobacco abuse     Past Surgical History:  Procedure Laterality Date  . COLON SURGERY      Social History   Socioeconomic History  . Marital status: Married    Spouse name: Not on file  . Number of children: 7  . Years of education: Not on file  . Highest education level: Not on file  Occupational History  . Occupation: retired  Scientific laboratory technician  . Financial resource strain: Not on file  . Food insecurity:    Worry: Not on file    Inability: Not on file  . Transportation needs:    Medical: Not on file    Non-medical: Not on file  Tobacco Use  . Smoking status: Former Smoker    Packs/day: 1.50    Years: 50.00    Pack years: 75.00    Types: Cigarettes    Last attempt to quit: 11/04/2010    Years since  quitting: 6.9  . Smokeless tobacco: Never Used  Substance and Sexual Activity  . Alcohol use: No    Alcohol/week: 0.0 oz  . Drug use: No  . Sexual activity: Not on file  Lifestyle  . Physical activity:    Days per week: Not on file    Minutes per session: Not on file  . Stress: Not on file  Relationships  . Social connections:    Talks on phone: Not on file    Gets together: Not on file    Attends religious service: Not on file    Active member of club or organization: Not on file    Attends meetings of clubs or organizations: Not on file    Relationship status: Not on file  . Intimate partner violence:    Fear of current or ex partner: Not on file    Emotionally abused: Not on file    Physically abused: Not on file    Forced sexual activity: Not on file  Other Topics Concern  . Not on file  Social History Narrative  . Not on file    Current Outpatient Medications on File Prior to Visit  Medication Sig Dispense Refill  . acetaminophen (TYLENOL) 500 MG tablet  Take 500 mg by mouth every 8 (eight) hours as needed for mild pain.     Marland Kitchen albuterol (PROVENTIL) (2.5 MG/3ML) 0.083% nebulizer solution Take 3 mLs (2.5 mg total) by nebulization every 6 (six) hours as needed for wheezing or shortness of breath. 360 mL 11  . atorvastatin (LIPITOR) 20 MG tablet Take 20 mg by mouth daily at 6 PM.     . Blood Glucose Monitoring Suppl (ONETOUCH VERIO FLEX SYSTEM) w/Device KIT 2 each by Does not apply route 2 (two) times daily. 1 kit 2  . furosemide (LASIX) 20 MG tablet Take 1 tablet (20 mg total) by mouth every other day as needed for fluid or edema. (Patient taking differently: Take 20 mg by mouth daily as needed for fluid or edema. ) 30 tablet 0  . glucose blood (ONETOUCH VERIO) test strip Use to check blood sugar 2 times per day. 200 each 2  . lisinopril (PRINIVIL) 10 MG tablet Take 1 tablet (10 mg total) by mouth daily. 30 tablet 0  . ONETOUCH DELICA LANCETS 47M MISC Use to check blood sugar  2 times per day. 200 each 2  . Tiotropium Bromide-Olodaterol (STIOLTO RESPIMAT) 2.5-2.5 MCG/ACT AERS Inhale 2 puffs into the lungs daily. 3 Inhaler 1   No current facility-administered medications on file prior to visit.     No Known Allergies  Family History  Problem Relation Age of Onset  . Diabetes Mother   . Hypertension Mother   . Peripheral vascular disease Mother        amputation  . Diabetes Father        Right Leg Amputation-Gangrene  . Hypertension Father   . Peripheral vascular disease Father        amputation  . Cancer Father        Lead Poison-Ca  . Pneumonia Father   . Diabetes Sister   . Hypertension Sister   . Diabetes Daughter   . Hypertension Daughter     BP 140/80 (BP Location: Left Arm, Patient Position: Sitting, Cuff Size: Normal)   Pulse 87   Ht '5\' 5"'  (1.651 m)   Wt 186 lb (84.4 kg)   SpO2 97%   BMI 30.95 kg/m    Review of Systems He denies hypoglycemia    Objective:   Physical Exam VITAL SIGNS:  See vs page GENERAL: no distress Pulses: foot pulses are intact bilaterally.   MSK: no deformity of the feet or ankles.  CV: 2+ bilat edema of the legs.   Skin:  no ulcer on the feet or ankles.  Normal temp on the feet and ankles.  There is dense hyperpigmentation on the feet and legs.   Neuro: sensation is intact to touch on the feet and ankles, but decreased from normal.   Ext: right great toenail is absent.      Assessment & Plan:  Insulin-requiring type 2 DM: he needs increased rx   Patient Instructions  check your blood sugar twice a day.  vary the time of day when you check, between before the 3 meals, and at bedtime.  also check if you have symptoms of your blood sugar being too high or too low.  please keep a record of the readings and bring it to your next appointment here (or you can bring the meter itself).  You can write it on any piece of paper.  please call us sooner if your blood sugar goes below 70, or if you have a lot of  readings  over 200.   please increase the insulin to 55 units daily, all with breakfast.   On this type of insulin schedule, you should eat meals on a regular schedule (especially lunch).  If a meal is missed or significantly delayed, your blood sugar could go low.   Please come back for a follow-up appointment in 6 weeks.

## 2017-10-01 ENCOUNTER — Emergency Department (HOSPITAL_COMMUNITY)
Admission: EM | Admit: 2017-10-01 | Discharge: 2017-10-02 | Disposition: A | Discharge status: Home / Self Care | Payer: Medicare Other | Attending: Emergency Medicine | Admitting: Emergency Medicine

## 2017-10-01 ENCOUNTER — Other Ambulatory Visit: Payer: Self-pay

## 2017-10-01 ENCOUNTER — Encounter (HOSPITAL_COMMUNITY): Payer: Self-pay

## 2017-10-01 DIAGNOSIS — R2241 Localized swelling, mass and lump, right lower limb: Secondary | ICD-10-CM | POA: Diagnosis not present

## 2017-10-01 DIAGNOSIS — M79661 Pain in right lower leg: Secondary | ICD-10-CM | POA: Insufficient documentation

## 2017-10-01 DIAGNOSIS — E119 Type 2 diabetes mellitus without complications: Secondary | ICD-10-CM | POA: Insufficient documentation

## 2017-10-01 DIAGNOSIS — Z5321 Procedure and treatment not carried out due to patient leaving prior to being seen by health care provider: Secondary | ICD-10-CM | POA: Insufficient documentation

## 2017-10-01 LAB — CBC WITH DIFFERENTIAL/PLATELET
Abs Immature Granulocytes: 0 10*3/uL (ref 0.0–0.1)
Basophils Absolute: 0 10*3/uL (ref 0.0–0.1)
Basophils Relative: 0 %
EOS PCT: 3 %
Eosinophils Absolute: 0.3 10*3/uL (ref 0.0–0.7)
HEMATOCRIT: 49.3 % (ref 39.0–52.0)
HEMOGLOBIN: 14.8 g/dL (ref 13.0–17.0)
Immature Granulocytes: 1 %
LYMPHS ABS: 1.6 10*3/uL (ref 0.7–4.0)
LYMPHS PCT: 21 %
MCH: 26.5 pg (ref 26.0–34.0)
MCHC: 30 g/dL (ref 30.0–36.0)
MCV: 88.4 fL (ref 78.0–100.0)
MONO ABS: 0.7 10*3/uL (ref 0.1–1.0)
MONOS PCT: 9 %
Neutro Abs: 4.9 10*3/uL (ref 1.7–7.7)
Neutrophils Relative %: 66 %
Platelets: 186 10*3/uL (ref 150–400)
RBC: 5.58 MIL/uL (ref 4.22–5.81)
RDW: 12.3 % (ref 11.5–15.5)
WBC: 7.5 10*3/uL (ref 4.0–10.5)

## 2017-10-01 LAB — COMPREHENSIVE METABOLIC PANEL
ALT: 20 U/L (ref 17–63)
AST: 25 U/L (ref 15–41)
Albumin: 3.6 g/dL (ref 3.5–5.0)
Alkaline Phosphatase: 61 U/L (ref 38–126)
Anion gap: 6 (ref 5–15)
BUN: 18 mg/dL (ref 6–20)
CHLORIDE: 98 mmol/L — AB (ref 101–111)
CO2: 34 mmol/L — AB (ref 22–32)
Calcium: 9.3 mg/dL (ref 8.9–10.3)
Creatinine, Ser: 1.05 mg/dL (ref 0.61–1.24)
GFR calc Af Amer: >60 mL/min (ref >60)
GFR calc non Af Amer: >60 mL/min (ref >60)
GLUCOSE: 219 mg/dL — AB (ref 65–99)
POTASSIUM: 4.3 mmol/L (ref 3.5–5.1)
Sodium: 138 mmol/L (ref 135–145)
Total Bilirubin: 0.5 mg/dL (ref 0.3–1.2)
Total Protein: 7.2 g/dL (ref 6.5–8.1)

## 2017-10-01 NOTE — ED Notes (Signed)
Please call wife when ready to discharge:  [phone]

## 2017-10-01 NOTE — ED Triage Notes (Signed)
Pt endorses right lower leg pain/swelling x 1 year, pt noted to have skin tear with redness to right lower leg. Pt has DM and wears 2.5L O2 at all times. VSS.

## 2017-10-02 NOTE — ED Notes (Signed)
Call pt.x3 for reasses vitalsigns

## 2017-10-06 ENCOUNTER — Encounter: Payer: Self-pay | Admitting: Cardiology

## 2017-10-21 ENCOUNTER — Ambulatory Visit (INDEPENDENT_AMBULATORY_CARE_PROVIDER_SITE_OTHER): Payer: Medicare Other | Admitting: Family

## 2017-10-21 ENCOUNTER — Encounter: Payer: Self-pay | Admitting: Family

## 2017-10-21 ENCOUNTER — Ambulatory Visit (HOSPITAL_COMMUNITY)
Admission: RE | Admit: 2017-10-21 | Discharge: 2017-10-21 | Disposition: A | Discharge status: Home / Self Care | Payer: Medicare Other | Source: Ambulatory Visit | Attending: Family | Admitting: Family

## 2017-10-21 VITALS — BP 155/93 | HR 102 | Temp 97.6°F | Ht 65.0 in

## 2017-10-21 DIAGNOSIS — I83019 Varicose veins of right lower extremity with ulcer of unspecified site: Secondary | ICD-10-CM | POA: Diagnosis not present

## 2017-10-21 DIAGNOSIS — L97919 Non-pressure chronic ulcer of unspecified part of right lower leg with unspecified severity: Secondary | ICD-10-CM

## 2017-10-21 DIAGNOSIS — I83029 Varicose veins of left lower extremity with ulcer of unspecified site: Secondary | ICD-10-CM

## 2017-10-21 DIAGNOSIS — E1165 Type 2 diabetes mellitus with hyperglycemia: Secondary | ICD-10-CM | POA: Diagnosis not present

## 2017-10-21 DIAGNOSIS — I872 Venous insufficiency (chronic) (peripheral): Secondary | ICD-10-CM

## 2017-10-21 DIAGNOSIS — Z87891 Personal history of nicotine dependence: Secondary | ICD-10-CM

## 2017-10-21 DIAGNOSIS — I779 Disorder of arteries and arterioles, unspecified: Secondary | ICD-10-CM | POA: Diagnosis not present

## 2017-10-21 DIAGNOSIS — I739 Peripheral vascular disease, unspecified: Secondary | ICD-10-CM | POA: Diagnosis not present

## 2017-10-21 DIAGNOSIS — R609 Edema, unspecified: Secondary | ICD-10-CM | POA: Diagnosis not present

## 2017-10-21 DIAGNOSIS — E1151 Type 2 diabetes mellitus with diabetic peripheral angiopathy without gangrene: Secondary | ICD-10-CM | POA: Insufficient documentation

## 2017-10-21 DIAGNOSIS — IMO0002 Reserved for concepts with insufficient information to code with codable children: Secondary | ICD-10-CM

## 2017-10-21 DIAGNOSIS — L97929 Non-pressure chronic ulcer of unspecified part of left lower leg with unspecified severity: Secondary | ICD-10-CM

## 2017-10-21 NOTE — Progress Notes (Signed)
[facility] [place]   CC: Follow up peripheral artery occlusive disease  History of Present Illness Ivan Parks is a 77 y.o. male referred to Dr. Oneida Alar for swelling in his right leg. He has bilateral lower legs dependent edema with chronic venous stasis dermatitis, and mild peripheral artery occlusive disease.   He states that he has had swelling in his right leg since he was a kid.   He denies any claudication symptoms, but he does not seem to walk much, limited by right knee pain and possibly dyspnea. States that he was told he has some broken bones in his right foot and had a right knee injury in his younger years. He does have a hx of tobacco use, but quit in 2012.  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   He was seen by Dr. Lindon Romp for wound care of his right anterior lower leg, treated by his daughter, wounds healed, 2 visits to wound care and discharged as his wounds were healed. He has chronic venous insufficiency in his right lower leg, by history he does not seem to elevate his legs much, sleeps sitting up with his legs dependent.   Pt Diabetic: Yes, A1C on 08-25-17 was 11.6 (review of records), uncontrolled. He has been seeing an endocrinologist since 2017.  Pt smoker: former smoker, quit in 2012  Pt meds include: Statin :Yes ASA: No, states he has no allergy to ASA, denies bleeding problems, denies GI ulcers or bleeding Other anticoagulants/antiplatelets: no    Past Medical History:  Diagnosis Date  . Acute respiratory failure ([place])   . Bilateral lower extremity edema   . Bronchitis   . CHF (congestive heart failure) ([place])   . COPD (chronic obstructive pulmonary disease) ([place])   . DM (diabetes mellitus) ([place])   . HTN (hypertension)   . Hypoxemia   . OSA (obstructive sleep apnea)   . Peripheral vascular disease ([place])   . Tobacco abuse     Social History Social History   Tobacco Use  . Smoking status: Former Smoker     Packs/day: 1.50    Years: 50.00    Pack years: 75.00    Types: Cigarettes    Last attempt to quit: 11/04/2010    Years since quitting: 6.9  . Smokeless tobacco: Never Used  Substance Use Topics  . Alcohol use: No    Alcohol/week: 0.0 oz  . Drug use: No    Family History Family History  Problem Relation Age of Onset  . Diabetes Mother   . Hypertension Mother   . Peripheral vascular disease Mother        amputation  . Diabetes Father        Right Leg Amputation-Gangrene  . Hypertension Father   . Peripheral vascular disease Father        amputation  . Cancer Father        Lead Poison-Ca  . Pneumonia Father   . Diabetes Sister   . Hypertension Sister   . Diabetes Daughter   . Hypertension Daughter     Past Surgical History:  Procedure Laterality Date  . COLON SURGERY      No Known Allergies  Current Outpatient Medications  Medication Sig Dispense Refill  . acetaminophen (TYLENOL) 500 MG tablet Take 500 mg by mouth every 8 (eight) hours as needed for mild pain.     Marland Kitchen albuterol (PROVENTIL) (2.5 MG/3ML) 0.083% nebulizer solution Take 3 mLs (2.5 mg total)  by nebulization every 6 (six) hours as needed for wheezing or shortness of breath. 360 mL 11  . atorvastatin (LIPITOR) 20 MG tablet Take 20 mg by mouth daily at 6 PM.     . Blood Glucose Monitoring Suppl (ONETOUCH VERIO FLEX SYSTEM) w/Device KIT 2 each by Does not apply route 2 (two) times daily. 1 kit 2  . furosemide (LASIX) 20 MG tablet Take 1 tablet (20 mg total) by mouth every other day as needed for fluid or edema. (Patient taking differently: Take 20 mg by mouth daily as needed for fluid or edema. ) 30 tablet 0  . glucose blood (ONETOUCH VERIO) test strip Use to check blood sugar 2 times per day. 200 each 2  . insulin NPH-regular Human (HUMULIN 70/30) (70-30) 100 UNIT/ML injection Inject 55 Units into the skin daily with breakfast. Inject 40 units into the skin daily with breakfast. And syringes 1/day. 20 mL 11  .  lisinopril (PRINIVIL,ZESTRIL) 20 MG tablet Take 20 mg by mouth daily.    Glory Rosebush DELICA LANCETS 40X MISC Use to check blood sugar 2 times per day. 200 each 2  . Tiotropium Bromide-Olodaterol (STIOLTO RESPIMAT) 2.5-2.5 MCG/ACT AERS Inhale 2 puffs into the lungs daily. 3 Inhaler 1   No current facility-administered medications for this visit.     ROS: See HPI for pertinent positives and negatives.   Physical Examination  Vitals:   10/21/17 1147 10/21/17 1155  BP: (!) 147/91 (!) 155/93  Pulse: (!) 102   Temp: 97.6 F (36.4 C)   TempSrc: Oral   SpO2: 93%   Height: '5\' 5"'  (1.651 m)    Body mass index is 30.95 kg/m.  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  Eyes: Pupils equal Pulmonary: Slightly-labored breathing, limited air movement in all fields, no rales, rhonchi,or wheezing; wearing nasal canula with supplemental O2. Cardiac: RRR, with no appreciable murmur; without carotid bruits Abdomen: soft, NT, no palpable masses, large abdomen  Skin: without rashes,  see Extremities.  Vascular Exam/Pulses:  Right Left  Femoral Not palpable (obese) Not palpable (obese)  DP Not palpable not palpable   PT Not palpable Not palpable   Extremities: without ischemic changes, without Gangrene , without cellulitis;  Venous stasis ulcers at bilateral mid lower legs, 2+ pitting and non pitting edema in bilateral LE, Leathery skin of both lower legs consistent with chronic venous insufficiency and dermatitis.  Musculoskeletal: no muscle wasting or atrophy. Muscle strength 5/5 in upper, 4/5 in lower extremities.  Neurologic: A&O X 3; appropriate affect, Sensation is normal; MOTOR FUNCTION:  moving all extremities equally, motor strength 5/5 throughout. Speech is fluent/normal. CN 2-12 intact except some hearing loss. Psychiatric: Thought content is  normal, mood appropriate for clinical situation.     ASSESSMENT: LUIAN Parks is a 77 y.o. male who presents with chronic venous stasis and ulcers on both lower legs. He also has mild peripheral artery occlusive disease.  He seems to leave his lower legs in a dependent position, even overnight. He has somewhat debilitating CHF and COPD. He is also obese and has OSA.  His large abdomen is likely a factor that places pressure on his IVC and femoral veins, inhibiting adequate venous return.   -Mild PAD,pulses are not palpable feet, ankles, and bilateral lower legs with 2+ non pitting and pitting edema. His walking is limited by right knee pain,  see Plan. -Chronic venous insufficiency of lower legs with stasis ulcers and dependent edema, see Plan and pt instructions. -Uncontrolled DM is his primary atherosclerotic risk factor. Other atherosclerotic risk factors for him are former smoker, COPD, obesity, and sedentary lifestyle.  He has seen wound care in the past, and states he has an appointment to see them on 11-03-17 for bilateral venous stasis ulcers both lower legs.     DATA  ABI (Date: 10/21/2017):  R:   ABI: 0.85 (was 0.89 on 10-15-16),   PT: bi  DP: bi  TBI:  0.78 (was 0.53)  L:   ABI: 1.05 (was 1.09),   PT: waveform morphology not documented (was tri)  DP: waveform morphology not documented (was tri)   TBI: 1.09 (was 0.88)  Stable bilateral ABI with mild disease in the right and no disease in the left.  Improved bilateral TBI.     PLAN:  Daily seated leg exercises discussed and demonstrated.  Based on the patient's vascular studies and examination, pt will return to clinic in 1 year with ABI's. Dependent edema in both lower legs and feet: elevation, see Patient Instructions.  Wound care to manage venous stasis ulcers in both lower legs.   I discussed in depth with the patient the nature of atherosclerosis, and emphasized the importance of maximal medical  management including strict control of blood pressure, blood glucose, and lipid levels, obtaining regular exercise, and continued cessation of smoking.  The patient is aware that without maximal medical management the underlying atherosclerotic disease process will progress, limiting the benefit of any interventions.  The patient was given information about PAD including signs, symptoms, treatment, what symptoms should prompt the patient to seek immediate medical care, and risk reduction measures to take.  Clemon Chambers, RN, MSN, FNP-C [facility] Phone: [phone]  Clinic MD: Oneida Alar  10/21/17 12:22 PM

## 2017-10-21 NOTE — Patient Instructions (Addendum)
To decrease swelling in your feet and legs: Elevate feet above slightly bent knees, feet above heart, overnight and 3-4 times per day for 20 minutes.    Chronic Venous Insufficiency Chronic venous insufficiency, also called venous stasis, is a condition that prevents blood from being pumped effectively through the veins in your legs. Blood may no longer be pumped effectively from the legs back to the heart. This condition can range from mild to severe. With proper treatment, you should be able to continue with an active life. What are the causes? Chronic venous insufficiency occurs when the vein walls become stretched, weakened, or damaged, or when valves within the vein are damaged. Some common causes of this include:  High blood pressure inside the veins (venous hypertension).  Increased blood pressure in the leg veins from long periods of sitting or standing.  A blood clot that blocks blood flow in a vein (deep vein thrombosis, DVT).  Inflammation of a vein (phlebitis) that causes a blood clot to form.  Tumors in the pelvis that cause blood to back up.  What increases the risk? The following factors may make you more likely to develop this condition:  Having a family history of this condition.  Obesity.  Pregnancy.  Living without enough physical activity or exercise (sedentary lifestyle).  Smoking.  Having a job that requires long periods of standing or sitting in one place.  Being a certain age. Women in their 40s and 50s and men in their 70s are more likely to develop this condition.  What are the signs or symptoms? Symptoms of this condition include:  Veins that are enlarged, bulging, or twisted (varicose veins).  Skin breakdown or ulcers.  Reddened or discolored skin on the front of the leg.  Brown, smooth, tight, and painful skin just above the ankle, usually on the inside of the leg (lipodermatosclerosis).  Swelling.  How is this diagnosed? This  condition may be diagnosed based on:  Your medical history.  A physical exam.  Tests, such as: ? A procedure that creates an image of a blood vessel and nearby organs and provides information about blood flow through the blood vessel (duplex ultrasound). ? A procedure that tests blood flow (plethysmography). ? A procedure to look at the veins using X-ray and dye (venogram).  How is this treated? The goals of treatment are to help you return to an active life and to minimize pain or disability. Treatment depends on the severity of your condition, and it may include:  Wearing compression stockings. These can help relieve symptoms and help prevent your condition from getting worse. However, they do not cure the condition.  Sclerotherapy. This is a procedure involving an injection of a material that "dissolves" damaged veins.  Surgery. This may involve: ? Removing a diseased vein (vein stripping). ? Cutting off blood flow through the vein (laser ablation surgery). ? Repairing a valve.  Follow these instructions at home:  Wear compression stockings as told by your health care provider. These stockings help to prevent blood clots and reduce swelling in your legs.  Take over-the-counter and prescription medicines only as told by your health care provider.  Stay active by exercising, walking, or doing different activities. Ask your health care provider what activities are safe for you and how much exercise you need.  Drink enough fluid to keep your urine clear or pale yellow.  Do not use any products that contain nicotine or tobacco, such as cigarettes and e-cigarettes. If you need help   quitting, ask your health care provider.  Keep all follow-up visits as told by your health care provider. This is important. Contact a health care provider if:  You have redness, swelling, or more pain in the affected area.  You see a red streak or line that extends up or down from the affected  area.  You have skin breakdown or a loss of skin in the affected area, even if the breakdown is small.  You get an injury in the affected area. Get help right away if:  You get an injury and an open wound in the affected area.  You have severe pain that does not get better with medicine.  You have sudden numbness or weakness in the foot or ankle below the affected area, or you have trouble moving your foot or ankle.  You have a fever and you have worse or persistent symptoms.  You have chest pain.  You have shortness of breath. Summary  Chronic venous insufficiency, also called venous stasis, is a condition that prevents blood from being pumped effectively through the veins in your legs.  Chronic venous insufficiency occurs when the vein walls become stretched, weakened, or damaged, or when valves within the vein are damaged.  Treatment for this condition depends on how severe your condition is, and it may involve wearing compression stockings or having a procedure.  Make sure you stay active by exercising, walking, or doing different activities. Ask your health care provider what activities are safe for you and how much exercise you need. This information is not intended to replace advice given to you by your health care provider. Make sure you discuss any questions you have with your health care provider. Document Released: 09/07/2006 Document Revised: 03/23/2016 Document Reviewed: 03/23/2016 Elsevier Interactive Patient Education  2017 Elsevier Inc[employer]   Peripheral Vascular Disease Peripheral vascular disease (PVD) is a disease of the blood vessels that are not part of your heart and brain. A simple term for PVD is poor circulation. In most cases, PVD narrows the blood vessels that carry blood from your heart to the rest of your body. This can result in a decreased supply of blood to your arms, legs, and internal organs, like your stomach or kidneys. However, it most often affects a  person's lower legs and feet. There are two types of PVD.  Organic PVD. This is the more common type. It is caused by damage to the structure of blood vessels.  Functional PVD. This is caused by conditions that make blood vessels contract and tighten (spasm).  Without treatment, PVD tends to get worse over time. PVD can also lead to acute ischemic limb. This is when an arm or limb suddenly has trouble getting enough blood. This is a medical emergency. Follow these instructions at home:  Take medicines only as told by your doctor.  Do not use any tobacco products, including cigarettes, chewing tobacco, or electronic cigarettes. If you need help quitting, ask your doctor.  Lose weight if you are overweight, and maintain a healthy weight as told by your doctor.  Eat a diet that is low in fat and cholesterol. If you need help, ask your doctor.  Exercise regularly. Ask your doctor for some good activities for you.  Take good care of your feet. ? Wear comfortable shoes that fit well. ? Check your feet often for any cuts or sores. Contact a doctor if:  You have cramps in your legs while walking.  You have leg pain when   you are at rest.  You have coldness in a leg or foot.  Your skin changes.  You are unable to get or have an erection (erectile dysfunction).  You have cuts or sores on your feet that are not healing. Get help right away if:  Your arm or leg turns cold and blue.  Your arms or legs become red, warm, swollen, painful, or numb.  You have chest pain or trouble breathing.  You suddenly have weakness in your face, arm, or leg.  You become very confused or you cannot speak.  You suddenly have a very bad headache.  You suddenly cannot see. This information is not intended to replace advice given to you by your health care provider. Make sure you discuss any questions you have with your health care provider. Document Released: 07/29/2009 Document Revised: 10/10/2015  Document Reviewed: 10/12/2013 Elsevier Interactive Patient Education  2017 Elsevier Inc.  

## 2017-11-01 IMAGING — DX DG CHEST 1V PORT
1 series · 1 of 1 positions shown · non-contrast
Comparison: 01/14/2016

CLINICAL DATA: Low blood pressure

EXAM:
PORTABLE CHEST 1 VIEW

[chest ap]
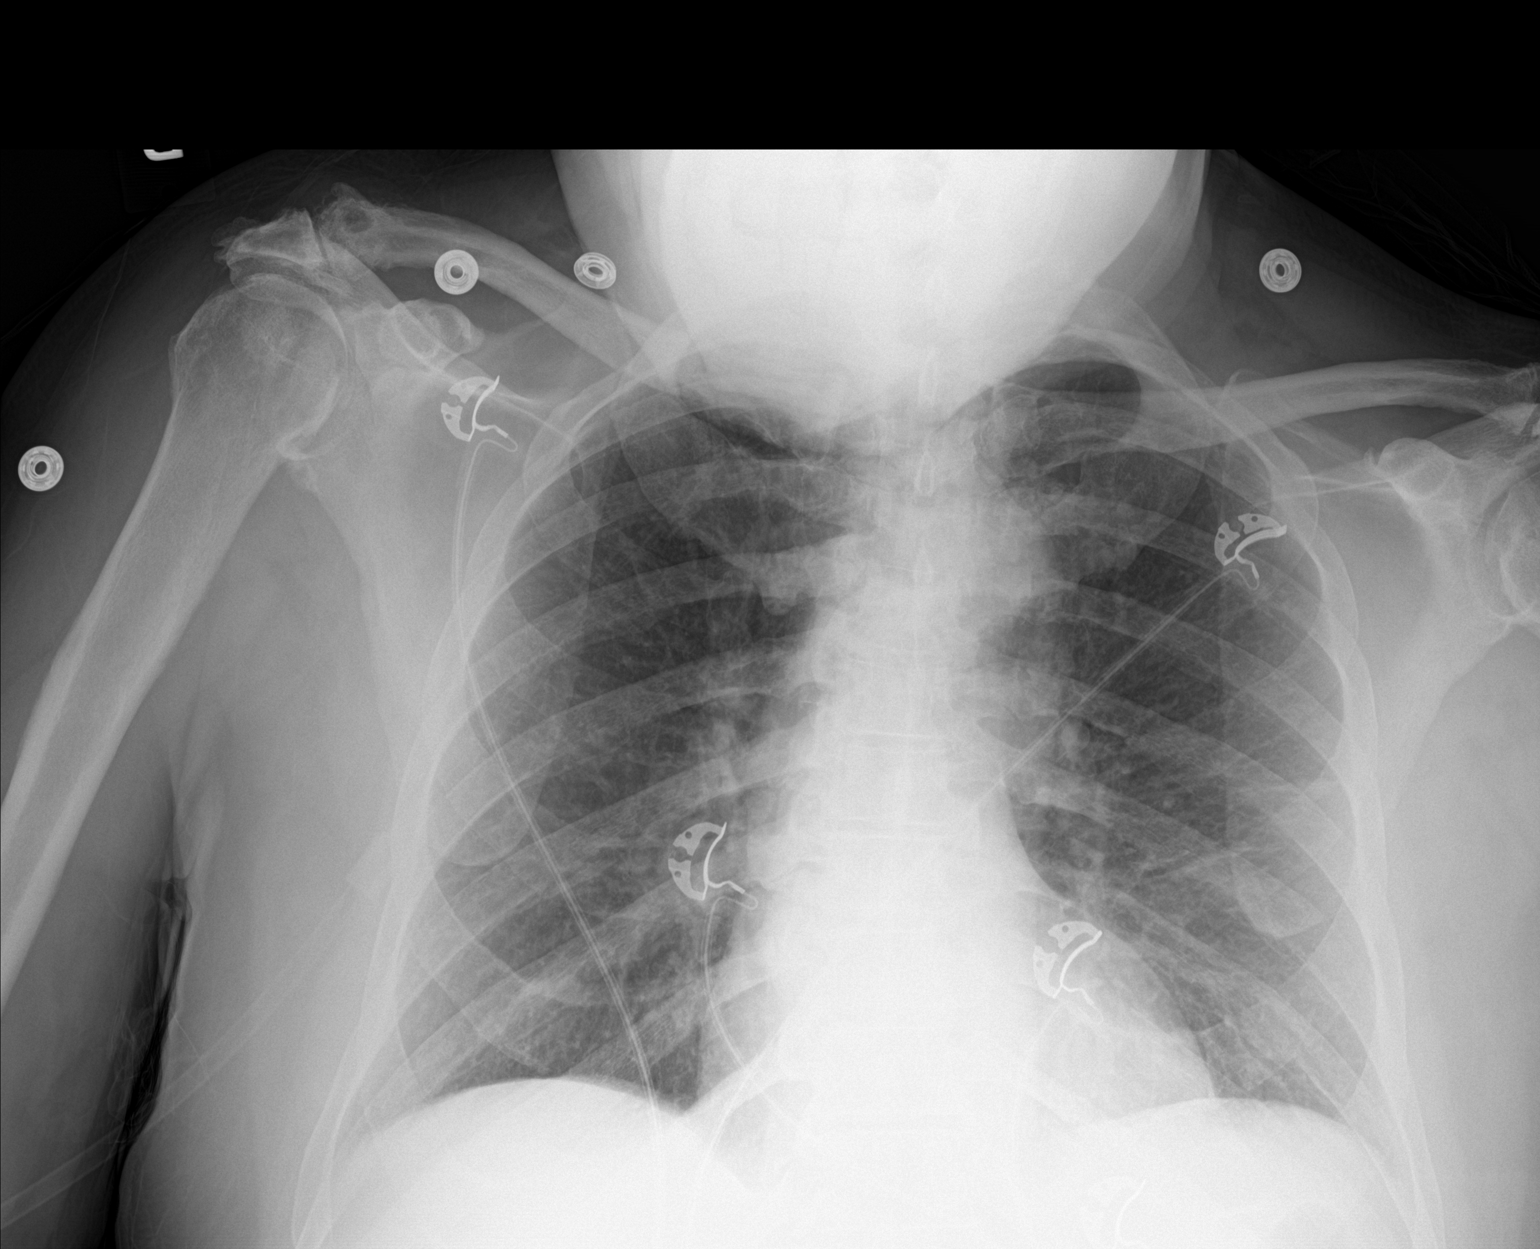

[1 of 1 positions shown; findings below may reference images not displayed]

FINDINGS: Heart and mediastinal contours are within normal limits. No focal
opacities or effusions. No acute bony abnormality.
IMPRESSION: No active disease.

## 2017-11-03 ENCOUNTER — Encounter (HOSPITAL_BASED_OUTPATIENT_CLINIC_OR_DEPARTMENT_OTHER): Payer: Self-pay

## 2017-11-03 ENCOUNTER — Encounter (HOSPITAL_BASED_OUTPATIENT_CLINIC_OR_DEPARTMENT_OTHER): Payer: Medicare Other | Attending: Physician Assistant

## 2017-11-03 DIAGNOSIS — I890 Lymphedema, not elsewhere classified: Secondary | ICD-10-CM | POA: Diagnosis not present

## 2017-11-03 DIAGNOSIS — Z87891 Personal history of nicotine dependence: Secondary | ICD-10-CM | POA: Insufficient documentation

## 2017-11-03 DIAGNOSIS — L97812 Non-pressure chronic ulcer of other part of right lower leg with fat layer exposed: Secondary | ICD-10-CM | POA: Diagnosis not present

## 2017-11-03 DIAGNOSIS — J449 Chronic obstructive pulmonary disease, unspecified: Secondary | ICD-10-CM | POA: Insufficient documentation

## 2017-11-03 DIAGNOSIS — E1151 Type 2 diabetes mellitus with diabetic peripheral angiopathy without gangrene: Secondary | ICD-10-CM | POA: Insufficient documentation

## 2017-11-03 DIAGNOSIS — Z9981 Dependence on supplemental oxygen: Secondary | ICD-10-CM | POA: Diagnosis not present

## 2017-11-03 DIAGNOSIS — I110 Hypertensive heart disease with heart failure: Secondary | ICD-10-CM | POA: Diagnosis not present

## 2017-11-03 DIAGNOSIS — I509 Heart failure, unspecified: Secondary | ICD-10-CM | POA: Insufficient documentation

## 2017-11-03 DIAGNOSIS — G4730 Sleep apnea, unspecified: Secondary | ICD-10-CM | POA: Diagnosis not present

## 2017-11-03 DIAGNOSIS — Z794 Long term (current) use of insulin: Secondary | ICD-10-CM | POA: Insufficient documentation

## 2017-11-03 DIAGNOSIS — E11622 Type 2 diabetes mellitus with other skin ulcer: Secondary | ICD-10-CM | POA: Insufficient documentation

## 2017-11-03 DIAGNOSIS — I87311 Chronic venous hypertension (idiopathic) with ulcer of right lower extremity: Secondary | ICD-10-CM | POA: Insufficient documentation

## 2017-11-10 DIAGNOSIS — E11622 Type 2 diabetes mellitus with other skin ulcer: Secondary | ICD-10-CM | POA: Diagnosis not present

## 2017-11-12 ENCOUNTER — Encounter: Payer: Self-pay | Admitting: Endocrinology

## 2017-11-12 ENCOUNTER — Ambulatory Visit (INDEPENDENT_AMBULATORY_CARE_PROVIDER_SITE_OTHER): Payer: Medicare Other | Admitting: Endocrinology

## 2017-11-12 VITALS — BP 178/100 | HR 108 | Wt 188.8 lb

## 2017-11-12 DIAGNOSIS — N182 Chronic kidney disease, stage 2 (mild): Secondary | ICD-10-CM

## 2017-11-12 DIAGNOSIS — E1122 Type 2 diabetes mellitus with diabetic chronic kidney disease: Secondary | ICD-10-CM

## 2017-11-12 DIAGNOSIS — Z794 Long term (current) use of insulin: Secondary | ICD-10-CM

## 2017-11-12 LAB — POCT GLYCOSYLATED HEMOGLOBIN (HGB A1C)

## 2017-11-12 MED ORDER — INSULIN NPH ISOPHANE & REGULAR (70-30) 100 UNIT/ML ~~LOC~~ SUSP: 58.0000 [IU] | Freq: Every day | SUBCUTANEOUS | 11 refills | Status: DC

## 2017-11-12 NOTE — Progress Notes (Signed)
Subjective:    Patient ID: Ivan Parks, male    DOB: Mar 05, 1941, 77 y.o.   MRN: [id number]  HPI Pt returns for f/u of diabetes mellitus: DM type: Insulin-requiring type 2.  Dx'ed: 0865 Complications: polyneuropathy, PAD, and renal insufficiency.   Therapy: insulin since 2016.   DKA: never Severe hypoglycemia: last episode was 2018.   Pancreatitis: never.   Other: he takes QD insulin, due to h/o noncompliance; he takes human insulin, due to cost; he has long duration of action of insulin, prob due to renal insuff; NPH was changed to 70/30, due to AM hypoglycemia and PM hyperglycemia.  Interval history: no cbg record, but states it varies from 70-200's.  There is no trend throughout the day.  He takes 55 units qam.   Past Medical History:  Diagnosis Date  . Acute respiratory failure ([place])   . Bilateral lower extremity edema   . Bronchitis   . CHF (congestive heart failure) ([place])   . COPD (chronic obstructive pulmonary disease) ([place])   . DM (diabetes mellitus) ([place])   . HTN (hypertension)   . Hypoxemia   . OSA (obstructive sleep apnea)   . Peripheral vascular disease ([place])   . Tobacco abuse     Past Surgical History:  Procedure Laterality Date  . COLON SURGERY      Social History   Socioeconomic History  . Marital status: Married    Spouse name: Not on file  . Number of children: 7  . Years of education: Not on file  . Highest education level: Not on file  Occupational History  . Occupation: retired  Scientific laboratory technician  . Financial resource strain: Not on file  . Food insecurity:    Worry: Not on file    Inability: Not on file  . Transportation needs:    Medical: Not on file    Non-medical: Not on file  Tobacco Use  . Smoking status: Former Smoker    Packs/day: 1.50    Years: 50.00    Pack years: 75.00    Types: Cigarettes    Last attempt to quit: 11/04/2010    Years since quitting: 7.0  . Smokeless tobacco: Never Used  Substance and Sexual Activity  .  Alcohol use: No    Alcohol/week: 0.0 oz  . Drug use: No  . Sexual activity: Not on file  Lifestyle  . Physical activity:    Days per week: Not on file    Minutes per session: Not on file  . Stress: Not on file  Relationships  . Social connections:    Talks on phone: Not on file    Gets together: Not on file    Attends religious service: Not on file    Active member of club or organization: Not on file    Attends meetings of clubs or organizations: Not on file    Relationship status: Not on file  . Intimate partner violence:    Fear of current or ex partner: Not on file    Emotionally abused: Not on file    Physically abused: Not on file    Forced sexual activity: Not on file  Other Topics Concern  . Not on file  Social History Narrative  . Not on file    Current Outpatient Medications on File Prior to Visit  Medication Sig Dispense Refill  . acetaminophen (TYLENOL) 500 MG tablet Take 500 mg by mouth every 8 (eight) hours as needed for mild pain.     Marland Kitchen  albuterol (PROVENTIL) (2.5 MG/3ML) 0.083% nebulizer solution Take 3 mLs (2.5 mg total) by nebulization every 6 (six) hours as needed for wheezing or shortness of breath. 360 mL 11  . atorvastatin (LIPITOR) 20 MG tablet Take 20 mg by mouth daily at 6 PM.     . Blood Glucose Monitoring Suppl (ONETOUCH VERIO FLEX SYSTEM) w/Device KIT 2 each by Does not apply route 2 (two) times daily. 1 kit 2  . furosemide (LASIX) 20 MG tablet Take 1 tablet (20 mg total) by mouth every other day as needed for fluid or edema. (Patient taking differently: Take 20 mg by mouth daily as needed for fluid or edema. ) 30 tablet 0  . glucose blood (ONETOUCH VERIO) test strip Use to check blood sugar 2 times per day. 200 each 2  . lisinopril (PRINIVIL,ZESTRIL) 20 MG tablet Take 20 mg by mouth daily.    Glory Rosebush DELICA LANCETS 44H MISC Use to check blood sugar 2 times per day. 200 each 2  . Tiotropium Bromide-Olodaterol (STIOLTO RESPIMAT) 2.5-2.5 MCG/ACT AERS  Inhale 2 puffs into the lungs daily. 3 Inhaler 1   No current facility-administered medications on file prior to visit.     No Known Allergies  Family History  Problem Relation Age of Onset  . Diabetes Mother   . Hypertension Mother   . Peripheral vascular disease Mother        amputation  . Diabetes Father        Right Leg Amputation-Gangrene  . Hypertension Father   . Peripheral vascular disease Father        amputation  . Cancer Father        Lead Poison-Ca  . Pneumonia Father   . Diabetes Sister   . Hypertension Sister   . Diabetes Daughter   . Hypertension Daughter     BP (!) 178/100 (BP Location: Left Arm, Patient Position: Sitting, Cuff Size: Normal)   Pulse (!) 108   Wt 188 lb 12.8 oz (85.6 kg)   SpO2 93%   BMI 31.42 kg/m    Review of Systems He denies hypoglycemia    Objective:   Physical Exam VITAL SIGNS:  See vs page GENERAL: no distress Pulses: left foot pulses are intact.   MSK: no deformity of the feet or ankles.  CV: 2+ left leg edema.   Skin:  no ulcer on the left foot or ankle.  Normal temp on the feet.  dense hyperpigmentation on the left foot and leg.   Neuro: sensation is intact to touch on the feet , but decreased from normal.   Ext: right great toenail is absent. Right leg is partially bandaged  Lab Results  Component Value Date   CREATININE 1.05 10/01/2017   BUN 18 10/01/2017   NA 138 10/01/2017   K 4.3 10/01/2017   CL 98 (L) 10/01/2017   CO2 34 (H) 10/01/2017   A1c=8.1%    Assessment & Plan:  Insulin-requiring type 2 DM, with renal insuff: he needs increased rx   Patient Instructions  check your blood sugar twice a day.  vary the time of day when you check, between before the 3 meals, and at bedtime.  also check if you have symptoms of your blood sugar being too high or too low.  please keep a record of the readings and bring it to your next appointment here (or you can bring the meter itself).  You can write it on any piece of  paper.  please call  us sooner if your blood sugar goes below 70, or if you have a lot of readings over 200.   please increase the insulin to 58 units daily, with breakfast.   On this type of insulin schedule, you should eat meals on a regular schedule (especially lunch).  If a meal is missed or significantly delayed, your blood sugar could go low.   Please come back for a follow-up appointment in 2 months.

## 2017-11-12 NOTE — Patient Instructions (Addendum)
check your blood sugar twice a day.  vary the time of day when you check, between before the 3 meals, and at bedtime.  also check if you have symptoms of your blood sugar being too high or too low.  please keep a record of the readings and bring it to your next appointment here (or you can bring the meter itself).  You can write it on any piece of paper.  please call us sooner if your blood sugar goes below 70, or if you have a lot of readings over 200.   please increase the insulin to 58 units daily, with breakfast.   On this type of insulin schedule, you should eat meals on a regular schedule (especially lunch).  If a meal is missed or significantly delayed, your blood sugar could go low.   Please come back for a follow-up appointment in 2 months.

## 2017-11-16 DIAGNOSIS — I509 Heart failure, unspecified: Secondary | ICD-10-CM | POA: Insufficient documentation

## 2017-11-16 DIAGNOSIS — E785 Hyperlipidemia, unspecified: Secondary | ICD-10-CM | POA: Insufficient documentation

## 2017-11-16 DIAGNOSIS — I739 Peripheral vascular disease, unspecified: Secondary | ICD-10-CM | POA: Insufficient documentation

## 2017-11-17 ENCOUNTER — Encounter (HOSPITAL_BASED_OUTPATIENT_CLINIC_OR_DEPARTMENT_OTHER): Payer: Medicare Other | Attending: Physician Assistant

## 2017-11-17 DIAGNOSIS — L97312 Non-pressure chronic ulcer of right ankle with fat layer exposed: Secondary | ICD-10-CM | POA: Diagnosis not present

## 2017-11-17 DIAGNOSIS — Z794 Long term (current) use of insulin: Secondary | ICD-10-CM | POA: Diagnosis not present

## 2017-11-17 DIAGNOSIS — G4730 Sleep apnea, unspecified: Secondary | ICD-10-CM | POA: Insufficient documentation

## 2017-11-17 DIAGNOSIS — Z9981 Dependence on supplemental oxygen: Secondary | ICD-10-CM | POA: Insufficient documentation

## 2017-11-17 DIAGNOSIS — I890 Lymphedema, not elsewhere classified: Secondary | ICD-10-CM | POA: Diagnosis not present

## 2017-11-17 DIAGNOSIS — E1151 Type 2 diabetes mellitus with diabetic peripheral angiopathy without gangrene: Secondary | ICD-10-CM | POA: Insufficient documentation

## 2017-11-17 DIAGNOSIS — I87311 Chronic venous hypertension (idiopathic) with ulcer of right lower extremity: Secondary | ICD-10-CM | POA: Diagnosis not present

## 2017-11-17 DIAGNOSIS — I110 Hypertensive heart disease with heart failure: Secondary | ICD-10-CM | POA: Insufficient documentation

## 2017-11-17 DIAGNOSIS — E11622 Type 2 diabetes mellitus with other skin ulcer: Secondary | ICD-10-CM | POA: Diagnosis present

## 2017-11-17 DIAGNOSIS — J449 Chronic obstructive pulmonary disease, unspecified: Secondary | ICD-10-CM | POA: Insufficient documentation

## 2017-11-17 DIAGNOSIS — I509 Heart failure, unspecified: Secondary | ICD-10-CM | POA: Diagnosis not present

## 2017-11-24 DIAGNOSIS — E11622 Type 2 diabetes mellitus with other skin ulcer: Secondary | ICD-10-CM | POA: Diagnosis not present

## 2017-12-02 ENCOUNTER — Encounter: Payer: Self-pay | Admitting: Cardiology

## 2017-12-02 ENCOUNTER — Ambulatory Visit: Payer: Medicare Other | Admitting: Cardiology

## 2017-12-02 VITALS — BP 146/86 | HR 92 | Ht 65.0 in | Wt 190.0 lb

## 2017-12-02 DIAGNOSIS — I10 Essential (primary) hypertension: Secondary | ICD-10-CM | POA: Diagnosis not present

## 2017-12-02 DIAGNOSIS — R079 Chest pain, unspecified: Secondary | ICD-10-CM | POA: Diagnosis not present

## 2017-12-02 DIAGNOSIS — R609 Edema, unspecified: Secondary | ICD-10-CM

## 2017-12-02 DIAGNOSIS — E7800 Pure hypercholesterolemia, unspecified: Secondary | ICD-10-CM

## 2017-12-02 HISTORY — DX: Chest pain, unspecified: R07.9

## 2017-12-02 MED ORDER — FUROSEMIDE 40 MG PO TABS: 40.0000 mg | ORAL_TABLET | Freq: Every day | ORAL | 3 refills | Status: DC

## 2017-12-02 NOTE — Patient Instructions (Signed)
Medication Instructions:  Start taking Lasix 40 mg a day, by mouth  Labwork: Today: BMET, TSH and ProBNP  Testing/Procedures: Your physician has requested that you have an echocardiogram. Echocardiography is a painless test that uses sound waves to create images of your heart. It provides your doctor with information about the size and shape of your heart and how well your heart's chambers and valves are working. This procedure takes approximately one hour. There are no restrictions for this procedure.  Your physician has requested that you have en exercise stress myoview. For further information please visit [email or web address]. Please follow instruction sheet, as given.  Follow-Up: Follow up as needed    If you need a refill on your cardiac medications before your next appointment, please call your pharmacy.

## 2017-12-02 NOTE — Progress Notes (Signed)
Cardiology Office Note    Date:  12/02/2017   ID:  Ivan Parks, DOB February 22, 1941, MRN [id number]  PCP:  Harlan Stains, MD  Cardiologist:  Fransico Him, MD   Chief Complaint  Patient presents with  . Chest Pain    History of Present Illness:  Ivan Parks is a 77 y.o. male who is being seen today for the evaluation of chest pain at the request of Harlan Stains, MD.  This is a 77 year old male with a history of chronic lower extremity edema, CHF, diabetes mellitus, hypertension, PVD and hyperlipidemia who presented to his PCP recently complaining of left-sided chest pain.  He states that he gets this on a daily basis usually lasting about an hour.  It can be worse with movement but if he puts oxygen on it helps.  It is left-sided with no radiation and is not associated with nausea, diaphoresis or worsening of his chronic shortness of breath.  It is nonexertional.  He has chronic dyspnea on exertion due to underlying COPD and is on oxygen at 2.5 L.  He says that his breathing has been stable and has not worsened recently.  He also has marked lower extremity edema with open wounds and is been followed in the wound clinic.  His Lasix was recently increased to 20 mg daily but it really has not helped with his lower extremity edema.  He has multiple cardiac risk factors was never had a cardiac ischemic evaluation.  He is a former tobacco user for 40-pack-year history.   Past Medical History:  Diagnosis Date  . Acute respiratory failure ([place])   . Bilateral lower extremity edema   . Bronchitis   . CHF (congestive heart failure) ([place])   . COPD (chronic obstructive pulmonary disease) ([place])   . DM (diabetes mellitus) ([place])   . HTN (hypertension)   . Hyperlipidemia   . Hypoxemia   . OSA (obstructive sleep apnea)   . Peripheral vascular disease ([place])   . Tobacco abuse     Past Surgical History:  Procedure Laterality Date  . COLON SURGERY      Current Medications: Current Meds    Medication Sig  . acetaminophen (TYLENOL) 500 MG tablet Take 500 mg by mouth every 8 (eight) hours as needed for mild pain.   Marland Kitchen albuterol (PROVENTIL) (2.5 MG/3ML) 0.083% nebulizer solution Take 3 mLs (2.5 mg total) by nebulization every 6 (six) hours as needed for wheezing or shortness of breath.  Marland Kitchen atorvastatin (LIPITOR) 20 MG tablet Take 20 mg by mouth daily at 6 PM.   . Blood Glucose Monitoring Suppl (ONETOUCH VERIO FLEX SYSTEM) w/Device KIT 2 each by Does not apply route 2 (two) times daily.  . furosemide (LASIX) 20 MG tablet Take 1 tablet (20 mg total) by mouth every other day as needed for fluid or edema. (Patient taking differently: Take 20 mg by mouth daily as needed for fluid or edema. )  . glucose blood (ONETOUCH VERIO) test strip Use to check blood sugar 2 times per day.  . insulin NPH-regular Human (HUMULIN 70/30) (70-30) 100 UNIT/ML injection Inject 58 Units into the skin daily with breakfast. and syringes 1/day.  . lisinopril (PRINIVIL,ZESTRIL) 20 MG tablet Take 20 mg by mouth daily.  Glory Rosebush DELICA LANCETS 26R MISC Use to check blood sugar 2 times per day.  . Tiotropium Bromide-Olodaterol (STIOLTO RESPIMAT) 2.5-2.5 MCG/ACT AERS Inhale 2 puffs into the lungs daily.    Allergies:   Patient has no known  allergies.   Social History   Socioeconomic History  . Marital status: Married    Spouse name: Not on file  . Number of children: 7  . Years of education: Not on file  . Highest education level: Not on file  Occupational History  . Occupation: retired  Scientific laboratory technician  . Financial resource strain: Not on file  . Food insecurity:    Worry: Not on file    Inability: Not on file  . Transportation needs:    Medical: Not on file    Non-medical: Not on file  Tobacco Use  . Smoking status: Former Smoker    Packs/day: 1.50    Years: 50.00    Pack years: 75.00    Types: Cigarettes    Last attempt to quit: 11/04/2010    Years since quitting: 7.0  . Smokeless tobacco: Never  Used  Substance and Sexual Activity  . Alcohol use: No    Alcohol/week: 0.0 oz  . Drug use: No  . Sexual activity: Not on file  Lifestyle  . Physical activity:    Days per week: Not on file    Minutes per session: Not on file  . Stress: Not on file  Relationships  . Social connections:    Talks on phone: Not on file    Gets together: Not on file    Attends religious service: Not on file    Active member of club or organization: Not on file    Attends meetings of clubs or organizations: Not on file    Relationship status: Not on file  Other Topics Concern  . Not on file  Social History Narrative  . Not on file     Family History:  The patient's family history includes Cancer in his father; Diabetes in his daughter, father, mother, and sister; Hypertension in his daughter, father, mother, and sister; Peripheral vascular disease in his father and mother; Pneumonia in his father.   ROS:   Please see the history of present illness.    ROS All other systems reviewed and are negative.  No flowsheet data found.     PHYSICAL EXAM:   VS:  BP (!) 146/86 (BP Location: Left Arm, Patient Position: Sitting, Cuff Size: Normal)   Pulse 92   Ht _0  (1.651 m)   Wt 190 lb (86.2 kg)   SpO2 93%   BMI 31.62 kg/m     GEN: Well nourished, well developed, in no acute distress  HEENT: normal  Neck: no JVD, carotid bruits, or masses Cardiac: Patient does not want RRR; no murmurs, rubs, or gallops,no edema.  Intact distal pulses bilaterally.  Respiratory:  clear to auscultation bilaterally, normal work of breathing GI: soft, nontender, nondistended, + BS MS: no deformity or atrophy  Skin: warm and dry, no rash Neuro:  Alert and Oriented x 3, Strength and sensation are intact Psych: euthymic mood, full affect  Wt Readings from Last 3 Encounters:  12/02/17 190 lb (86.2 kg)  11/12/17 188 lb 12.8 oz (85.6 kg)  10/01/17 186 lb (84.4 kg)      Studies/Labs Reviewed:   EKG:  EKG is not  ordered today.  EKG from PCP office on 10/06/2017 showed normal sinus rhythm with no ST changes.  Recent Labs: 10/01/2017: ALT 20; BUN 18; Creatinine, Ser 1.05; Hemoglobin 14.8; Platelets 186; Potassium 4.3; Sodium 138   Lipid Panel No results found for: CHOL, TRIG, HDL, CHOLHDL, VLDL, LDLCALC, LDLDIRECT  Additional studies/ records that were reviewed today  include:  Office notes from PCP    ASSESSMENT:    1. Chest pain, unspecified type   2. Pure hypercholesterolemia   3. Essential hypertension      PLAN:  In order of problems listed above:  1. Chest pain - His chest pain is somewhat atypical in that it is not associated with any other symptoms such as nausea or diaphoresis but does occur on the left side of his chest and is a tightness.  It is nonexertional.  He thinks is related to reflux.his pain He does have multiple cardiac risk factors including hypertension, diabetes mellitus, hyperlipidemia and remote tobacco use.  I will get a Lexiscan Myoview to rule out ischemia  2.   Hyperlipidemia - his LDL was 69 03/30/2017 with normal ALT.  He will continue on atorvastatin 20 mg daily.  3.  Hypertension - BP is well controlled on exam today.  He will continue on Toprol 20 mg daily.  4.  Chronic diastolic CHF -2D echocardiogram 04/01/2016 showed normal LV function with grade 1 diastolic dysfunction.  He has marked lower extremity edema with open wounds and is actually being seen in the wound care clinic.  He had been on Lasix 20 mg as needed and then increased to 20 mg daily.  He still has marked lower extremity edema so I instructed him to increase his Lasix to 40 mg daily.  I will check a be met and BNP today. I will also repeat a 2D echocardiogram to make sure LV function is still appears stable. Medication Adjustments/Labs and Tests Ordered: Current medicines are reviewed at length with the patient today.  Concerns regarding medicines are outlined above.  Medication changes, Labs and  Tests ordered today are listed in the Patient Instructions below.  There are no Patient Instructions on file for this visit.   Signed, Fransico Him, MD  12/02/2017 2:03 PM    [address], [place], [place]  [address] Phone: [phone]; Fax: [phone]

## 2017-12-04 LAB — BASIC METABOLIC PANEL
BUN / CREAT RATIO: 22 (ref 10–24)
BUN: 21 mg/dL (ref 8–27)
CO2: 29 mmol/L (ref 20–29)
Calcium: 9.3 mg/dL (ref 8.6–10.2)
Chloride: 97 mmol/L (ref 96–106)
Creatinine, Ser: 0.97 mg/dL (ref 0.76–1.27)
GFR, EST AFRICAN AMERICAN: 87 mL/min/{1.73_m2} (ref >59)
GFR, EST NON AFRICAN AMERICAN: 75 mL/min/{1.73_m2} (ref >59)
GLUCOSE: 46 mg/dL — AB (ref 65–99)
Potassium: 4.8 mmol/L (ref 3.5–5.2)
SODIUM: 142 mmol/L (ref 134–144)

## 2017-12-04 LAB — PRO B NATRIURETIC PEPTIDE: NT-Pro BNP: 22 pg/mL (ref 0–486)

## 2017-12-04 LAB — TSH: TSH: 1.26 u[IU]/mL (ref 0.450–4.500)

## 2017-12-07 DIAGNOSIS — E11622 Type 2 diabetes mellitus with other skin ulcer: Secondary | ICD-10-CM | POA: Diagnosis not present

## 2017-12-09 ENCOUNTER — Ambulatory Visit (INDEPENDENT_AMBULATORY_CARE_PROVIDER_SITE_OTHER): Payer: Medicare Other | Admitting: Emergency Medicine

## 2017-12-09 ENCOUNTER — Encounter: Payer: Self-pay | Admitting: Emergency Medicine

## 2017-12-09 DIAGNOSIS — J449 Chronic obstructive pulmonary disease, unspecified: Secondary | ICD-10-CM | POA: Diagnosis not present

## 2017-12-09 DIAGNOSIS — J9611 Chronic respiratory failure with hypoxia: Secondary | ICD-10-CM

## 2017-12-09 DIAGNOSIS — J301 Allergic rhinitis due to pollen: Secondary | ICD-10-CM | POA: Diagnosis not present

## 2017-12-09 MED ORDER — TIOTROPIUM BROMIDE-OLODATEROL 2.5-2.5 MCG/ACT IN AERS: 2.0000 | INHALATION_SPRAY | Freq: Every day | RESPIRATORY_TRACT | 0 refills | Status: DC

## 2017-12-09 NOTE — Addendum Note (Signed)
Addended by: Zane HeraldMOORE, Modestine Scherzinger R on: 12/09/2017 12:03 PM   Modules accepted: Orders

## 2017-12-09 NOTE — Progress Notes (Signed)
Subjective:    Patient ID: Ivan Parks, male    DOB: Oct 22, 1940, 77 y.o.   MRN: [id number]  HPI 77 yo male seen for initial pulmonary consult 11/04/10 for AECOPD w/ vent depend resp failure.   ROV 04/19/17 --77 year old man with a history of former tobacco, COPD with severe obstruction by spirometry.  Also with allergic rhinitis and chronic hypoxemic respiratory failure.  He uses oxygen at 2.5 L/min, usually wears reliably but didn't wear it today. Currently he is dyspneic. He is on Stiolto, takes it reliably. Rarely uses albuterol. Minimal cough, denies any congestion. No wheeze.  Offered the flu shot - he doesn't want it. No acute exacerbations since last time.   ROV 12/09/17 --patient returns today for follow-up of his severe COPD, allergic rhinitis, chronic hypoxemic respiratory failure on supplemental oxygen.  He was seen here in March for an acute exacerbation and was treated with prednisone.  He reports that his breathing has been up and down. He stopped Stiolto 3 weeks ago, ran out of it. Has dyspnea sometimes at rest and often at night. He is having nasal congested all the time. He rarely coughs. No wheeze. He uses his O2  at 2.5L/min but only prn for dyspnea. He is using albuterol nebs prn, about    PULMONARY FUNCTON TEST 12/19/2010  FVC 1.71  FEV1 .93  FEV1/FVC 54.4  FVC  % Predicted 45  FEV % Predicted 36  FeF 25-75 .28  FeF 25-75 % Predicted 2.46    Review of Systems  Constitutional: Negative for fever and unexpected weight change.  HENT: Positive for sinus pressure. Negative for congestion, dental problem, ear pain, nosebleeds, postnasal drip, rhinorrhea, sneezing, sore throat and trouble swallowing.   Eyes: Negative for redness and itching.  Respiratory: Positive for shortness of breath. Negative for cough, chest tightness and wheezing.   Cardiovascular: Negative for palpitations and leg swelling.  Gastrointestinal: Negative for nausea and vomiting.  Genitourinary: Negative  for dysuria.  Musculoskeletal: Negative for joint swelling.  Skin: Negative for rash.  Neurological: Negative for headaches.  Hematological: Does not bruise/bleed easily.  Psychiatric/Behavioral: Negative for dysphoric mood. The patient is not nervous/anxious.      Objective:   Physical Exam Vitals:   12/09/17 1127  BP: 120/74  Pulse: (!) 102  SpO2: 90%  Weight: 193 lb (87.5 kg)  Height: 5\' 5"  (1.651 m)   Gen: Pleasant, well-nourished, in no distress,  normal affect  ENT: No lesions,  mouth clear,  oropharynx clear, no postnasal drip  Neck: No JVD, no stridor  Lungs: No use of accessory muscles, distant clear without rales or rhonchi  Cardiovascular: RRR, heart sounds normal, no murmur or gallops, B LE edema present  Musculoskeletal: No deformities, no cyanosis or clubbing  Neuro: alert, non focal, insight into his disease is poor  Skin: Warm, no lesions or rashes     Assessment & Plan:  COPD, severe (HCC) We will restart Stiolto 2 sprays once daily.  We will try to get samples of this for you today and then reorder through your pharmacy. Please check your insurance formulary so that we can see if there is a less expensive alternative to the Stiolto.  You can call us and let us know which medications are preferred. Continue use your albuterol nebulizer every 4 hours if needed for shortness of breath, chest tightness, wheezing. Consider getting the flu shot this fall. You will probably benefit from the Prevnar-13 pneumonia shot at some point in the future.  Allergic rhinitis Please start fluticasone nasal spray, 2 sprays each nostril once a day. Please start loratadine 10 mg daily   Chronic respiratory failure Continue oxygen at 2.5 L/min with exertion.  Levy Pupa, MD, PhD 12/09/2017, 11:57 AM [facility] [phone] or if no answer [phone]

## 2017-12-09 NOTE — Assessment & Plan Note (Signed)
Please start fluticasone nasal spray, 2 sprays each nostril once a day. Please start loratadine 10 mg daily

## 2017-12-09 NOTE — Patient Instructions (Addendum)
We will restart Stiolto 2 sprays once daily.  We will try to get samples of this for you today and then reorder through your pharmacy. Please check your insurance formulary so that we can see if there is a less expensive alternative to the Stiolto.  You can call us and let us know which medications are preferred. Continue use your albuterol nebulizer every 4 hours if needed for shortness of breath, chest tightness, wheezing. Continue your oxygen at 2.5 L/min with exertion. Please start fluticasone nasal spray, 2 sprays each nostril once a day. Please start loratadine 10 mg daily Consider getting the flu shot this fall. You will probably benefit from the Prevnar-13 pneumonia shot at some point in the future. Follow with Dr Delton CoombesByrum in 6 months or sooner if you have any problems

## 2017-12-09 NOTE — Addendum Note (Signed)
Addended by: Sylvester HarderMOORE, Bassem Bernasconi R on: 12/09/2017 04:56 PM   Modules accepted: Orders

## 2017-12-09 NOTE — Assessment & Plan Note (Signed)
Continue oxygen at 2.5 L/min with exertion.

## 2017-12-09 NOTE — Addendum Note (Signed)
Addended by: Sylvester HarderMOORE, Kaveh Kissinger R on: 12/09/2017 05:09 PM   Modules accepted: Orders

## 2017-12-09 NOTE — Assessment & Plan Note (Addendum)
We will restart Stiolto 2 sprays once daily.  We will try to get samples of this for you today and then reorder through your pharmacy. Please check your insurance formulary so that we can see if there is a less expensive alternative to the Stiolto.  You can call us and let us know which medications are preferred. Continue use your albuterol nebulizer every 4 hours if needed for shortness of breath, chest tightness, wheezing. Consider getting the flu shot this fall. You will probably benefit from the Prevnar-13 pneumonia shot at some point in the future.

## 2017-12-14 ENCOUNTER — Telehealth (HOSPITAL_COMMUNITY): Payer: Self-pay | Admitting: *Deleted

## 2017-12-14 DIAGNOSIS — E11622 Type 2 diabetes mellitus with other skin ulcer: Secondary | ICD-10-CM | POA: Diagnosis not present

## 2017-12-14 NOTE — Telephone Encounter (Signed)
Patient given detailed instructions per Myocardial Perfusion Study Information Sheet for the test on 12/16/17. Patient notified to arrive 15 minutes early and that it is imperative to arrive on time for appointment to keep from having the test rescheduled.  If you need to cancel or reschedule your appointment, please call the office within 24 hours of your appointment. . Patient verbalized understanding. Ivan Parks    

## 2017-12-16 ENCOUNTER — Other Ambulatory Visit: Payer: Self-pay

## 2017-12-16 ENCOUNTER — Ambulatory Visit (HOSPITAL_BASED_OUTPATIENT_CLINIC_OR_DEPARTMENT_OTHER): Payer: Medicare Other

## 2017-12-16 ENCOUNTER — Ambulatory Visit (HOSPITAL_COMMUNITY): Payer: Medicare Other | Attending: Cardiovascular Disease

## 2017-12-16 DIAGNOSIS — I509 Heart failure, unspecified: Secondary | ICD-10-CM | POA: Insufficient documentation

## 2017-12-16 DIAGNOSIS — I110 Hypertensive heart disease with heart failure: Secondary | ICD-10-CM | POA: Diagnosis not present

## 2017-12-16 DIAGNOSIS — R079 Chest pain, unspecified: Secondary | ICD-10-CM

## 2017-12-16 DIAGNOSIS — R0609 Other forms of dyspnea: Secondary | ICD-10-CM | POA: Insufficient documentation

## 2017-12-16 DIAGNOSIS — I351 Nonrheumatic aortic (valve) insufficiency: Secondary | ICD-10-CM | POA: Diagnosis not present

## 2017-12-16 DIAGNOSIS — E119 Type 2 diabetes mellitus without complications: Secondary | ICD-10-CM | POA: Insufficient documentation

## 2017-12-16 DIAGNOSIS — R609 Edema, unspecified: Secondary | ICD-10-CM | POA: Diagnosis not present

## 2017-12-16 LAB — ECHOCARDIOGRAM COMPLETE
Height: 65 in
Weight: 3040 oz

## 2017-12-16 MED ORDER — TECHNETIUM TC 99M TETROFOSMIN IV KIT
32.6000 | PACK | Freq: Once | INTRAVENOUS | Status: AC | PRN
  Administered 2017-12-16: 32.6 via INTRAVENOUS
  Filled 2017-12-16: qty 33

## 2017-12-16 MED ORDER — REGADENOSON 0.4 MG/5ML IV SOLN
0.4000 mg | Freq: Once | INTRAVENOUS | Status: AC
  Administered 2017-12-16: 0.4 mg via INTRAVENOUS

## 2017-12-16 MED ORDER — TECHNETIUM TC 99M TETROFOSMIN IV KIT
10.2000 | PACK | Freq: Once | INTRAVENOUS | Status: AC | PRN
  Administered 2017-12-16: 10.2 via INTRAVENOUS
  Filled 2017-12-16: qty 11

## 2017-12-16 NOTE — Progress Notes (Unsigned)
Ivan Parks  

## 2017-12-17 ENCOUNTER — Telehealth: Payer: Self-pay

## 2017-12-17 LAB — MYOCARDIAL PERFUSION IMAGING
CHL CUP NUCLEAR SDS: 2
CHL CUP NUCLEAR SRS: 8
CHL CUP NUCLEAR SSS: 10
CSEPPHR: 112 {beats}/min
LV dias vol: 104 mL (ref 62–150)
LV sys vol: 41 mL
RATE: 0.34
Rest HR: 95 {beats}/min
TID: 0.98

## 2017-12-17 MED ORDER — PANTOPRAZOLE SODIUM 40 MG PO TBEC: 40.0000 mg | DELAYED_RELEASE_TABLET | Freq: Every day | ORAL | 3 refills | Status: AC

## 2017-12-17 NOTE — Telephone Encounter (Signed)
Patient needs his appointment to be made by his daughter for f/u with extender in 4 weeks. Will send Protonix 40 mg daily to patient's pharmacy.

## 2017-12-17 NOTE — Telephone Encounter (Signed)
Notes recorded by Sigurd Sosapp, Claris Pech, RN on 12/17/2017 at 10:03 AM EDT Informed patient of Echo results. Verbalized understanding. ------

## 2017-12-17 NOTE — Telephone Encounter (Signed)
-----   Message from Quintella Reichertraci R Turner, MD sent at 12/17/2017  9:43 AM EDT ----- Echo showed normal LVF with moderately thickened heart muscle with increased stiffness of heart muscle, mild AR - no change from prior echo

## 2017-12-17 NOTE — Telephone Encounter (Signed)
-----   Message from Quintella Reichertraci R Turner, MD sent at 12/17/2017  4:54 PM EDT ----- Please have him start Protonix 40mg  daily and followup with extender in 4 weeks

## 2017-12-22 ENCOUNTER — Encounter (HOSPITAL_BASED_OUTPATIENT_CLINIC_OR_DEPARTMENT_OTHER): Payer: Medicare Other | Attending: Physician Assistant

## 2017-12-22 DIAGNOSIS — I509 Heart failure, unspecified: Secondary | ICD-10-CM | POA: Insufficient documentation

## 2017-12-22 DIAGNOSIS — I110 Hypertensive heart disease with heart failure: Secondary | ICD-10-CM | POA: Insufficient documentation

## 2017-12-22 DIAGNOSIS — J449 Chronic obstructive pulmonary disease, unspecified: Secondary | ICD-10-CM | POA: Diagnosis not present

## 2017-12-22 DIAGNOSIS — I890 Lymphedema, not elsewhere classified: Secondary | ICD-10-CM | POA: Insufficient documentation

## 2017-12-22 DIAGNOSIS — L97312 Non-pressure chronic ulcer of right ankle with fat layer exposed: Secondary | ICD-10-CM | POA: Insufficient documentation

## 2017-12-22 DIAGNOSIS — I87311 Chronic venous hypertension (idiopathic) with ulcer of right lower extremity: Secondary | ICD-10-CM | POA: Diagnosis not present

## 2017-12-22 DIAGNOSIS — Z87891 Personal history of nicotine dependence: Secondary | ICD-10-CM | POA: Diagnosis not present

## 2017-12-22 DIAGNOSIS — Z794 Long term (current) use of insulin: Secondary | ICD-10-CM | POA: Insufficient documentation

## 2017-12-22 DIAGNOSIS — E11622 Type 2 diabetes mellitus with other skin ulcer: Secondary | ICD-10-CM | POA: Insufficient documentation

## 2017-12-22 DIAGNOSIS — G4730 Sleep apnea, unspecified: Secondary | ICD-10-CM | POA: Insufficient documentation

## 2017-12-22 DIAGNOSIS — E1151 Type 2 diabetes mellitus with diabetic peripheral angiopathy without gangrene: Secondary | ICD-10-CM | POA: Insufficient documentation

## 2017-12-24 NOTE — Telephone Encounter (Signed)
Patient has follow up with Fransico MichaelBrittiany Simmons PA on 01/20/18. Patient's daughter verbalized understanding.

## 2017-12-29 DIAGNOSIS — E11622 Type 2 diabetes mellitus with other skin ulcer: Secondary | ICD-10-CM | POA: Diagnosis not present

## 2018-01-12 ENCOUNTER — Ambulatory Visit (INDEPENDENT_AMBULATORY_CARE_PROVIDER_SITE_OTHER): Payer: Medicare Other | Admitting: Endocrinology

## 2018-01-12 ENCOUNTER — Encounter: Payer: Self-pay | Admitting: Endocrinology

## 2018-01-12 VITALS — BP 132/82 | HR 88 | Ht 65.0 in | Wt 193.0 lb

## 2018-01-12 DIAGNOSIS — Z794 Long term (current) use of insulin: Secondary | ICD-10-CM

## 2018-01-12 DIAGNOSIS — N182 Chronic kidney disease, stage 2 (mild): Secondary | ICD-10-CM

## 2018-01-12 DIAGNOSIS — E1122 Type 2 diabetes mellitus with diabetic chronic kidney disease: Secondary | ICD-10-CM

## 2018-01-12 LAB — POCT GLYCOSYLATED HEMOGLOBIN (HGB A1C): Hemoglobin A1C: 7.7 % — AB (ref 4.0–5.6)

## 2018-01-12 MED ORDER — INSULIN NPH ISOPHANE & REGULAR (70-30) 100 UNIT/ML ~~LOC~~ SUSP: 60.0000 [IU] | Freq: Every day | SUBCUTANEOUS | 11 refills | Status: DC

## 2018-01-12 NOTE — Patient Instructions (Addendum)
check your blood sugar twice a day.  vary the time of day when you check, between before the 3 meals, and at bedtime.  also check if you have symptoms of your blood sugar being too high or too low.  please keep a record of the readings and bring it to your next appointment here (or you can bring the meter itself).  You can write it on any piece of paper.  please call us sooner if your blood sugar goes below 70, or if you have a lot of readings over 200.   Please continue the same insulin.   On this type of insulin schedule, you should eat meals on a regular schedule (especially lunch).  If a meal is missed or significantly delayed, your blood sugar could go low.   Please come back for a follow-up appointment in 2 months.   

## 2018-01-12 NOTE — Progress Notes (Signed)
Subjective:    Patient ID: Ivan Parks, male    DOB: Nov 05, 1940, 77 y.o.   MRN: [id number]  HPI Pt returns for f/u of diabetes mellitus: DM type: Insulin-requiring type 2.  Dx'ed: 7902 Complications: polyneuropathy, PAD, and renal insufficiency.   Therapy: insulin since 2016.   DKA: never Severe hypoglycemia: last episode was 2018.   Pancreatitis: never.   Other: he takes QD insulin, due to h/o noncompliance; he takes human insulin, due to cost; he has long duration of action of insulin, prob due to renal insuff; NPH was changed to 70/30, due to AM hypoglycemia and PM hyperglycemia.  Interval history: no cbg record, but states it varies from 75-200's.  There is no trend throughout the day.  He takes 60 units qam.   Past Medical History:  Diagnosis Date  . Acute respiratory failure ([place])   . AKI (acute kidney injury) ([place]) 04/01/2016  . Bilateral lower extremity edema   . Bronchitis   . Chest pain 12/02/2017  . CHF (congestive heart failure) ([place])   . Chronic respiratory failure ([place]) 11/26/2014  . COPD (chronic obstructive pulmonary disease) ([place])   . 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 Diabetes ([place]) 05/02/2016  . DM (diabetes mellitus) ([place])   . Essential hypertension 03/06/2015   hypertension   . HTN (hypertension)   . Hyperlipidemia   . Hypotension 03/31/2016  . Hypoxemia   . Leg edema, right- Greater than Left leg 07/05/2014   Lower extremity edema   . OSA (obstructive sleep apnea)   . Peripheral vascular disease ([place])   . Tobacco abuse     Past Surgical History:  Procedure Laterality Date  . COLON SURGERY      Social History   Socioeconomic History  . Marital status: Married    Spouse name: Not on file  . Number of children: 7  . Years of education: Not on file  . Highest education level: Not on file    Occupational History  . Occupation: retired  Scientific laboratory technician  . Financial resource strain: Not on file  . Food insecurity:    Worry: Not on file    Inability: Not on file  . Transportation needs:    Medical: Not on file    Non-medical: Not on file  Tobacco Use  . Smoking status: Former Smoker    Packs/day: 1.50    Years: 50.00    Pack years: 75.00    Types: Cigarettes    Last attempt to quit: 11/04/2010    Years since quitting: 7.2  . Smokeless tobacco: Never Used  Substance and Sexual Activity  . Alcohol use: No    Alcohol/week: 0.0 standard drinks  . Drug use: No  . Sexual activity: Not on file  Lifestyle  . Physical activity:    Days per week: Not on file    Minutes per session: Not on file  . Stress: Not on file  Relationships  . Social connections:    Talks on phone: Not on file    Gets together: Not on file    Attends religious service: Not on file    Active member of club or organization: Not on file    Attends meetings of clubs or organizations: Not on file    Relationship status: Not on file  .  Intimate partner violence:    Fear of current or ex partner: Not on file    Emotionally abused: Not on file    Physically abused: Not on file    Forced sexual activity: Not on file  Other Topics Concern  . Not on file  Social History Narrative  . Not on file    Current Outpatient Medications on File Prior to Visit  Medication Sig Dispense Refill  . acetaminophen (TYLENOL) 500 MG tablet Take 500 mg by mouth every 8 (eight) hours as needed for mild pain.     Marland Kitchen albuterol (PROVENTIL) (2.5 MG/3ML) 0.083% nebulizer solution Take 3 mLs (2.5 mg total) by nebulization every 6 (six) hours as needed for wheezing or shortness of breath. 360 mL 11  . atorvastatin (LIPITOR) 20 MG tablet Take 20 mg by mouth daily at 6 PM.     . Blood Glucose Monitoring Suppl (ONETOUCH VERIO FLEX SYSTEM) w/Device KIT 2 each by Does not apply route 2 (two) times daily. 1 kit 2  . furosemide (LASIX)  40 MG tablet Take 1 tablet (40 mg total) by mouth daily. 90 tablet 3  . glucose blood (ONETOUCH VERIO) test strip Use to check blood sugar 2 times per day. 200 each 2  . lisinopril (PRINIVIL,ZESTRIL) 20 MG tablet Take 20 mg by mouth daily.    Glory Rosebush DELICA LANCETS 46N MISC Use to check blood sugar 2 times per day. 200 each 2  . pantoprazole (PROTONIX) 40 MG tablet Take 1 tablet (40 mg total) by mouth daily. 90 tablet 3  . Tiotropium Bromide-Olodaterol (STIOLTO RESPIMAT) 2.5-2.5 MCG/ACT AERS Inhale 2 puffs into the lungs daily. 3 Inhaler 1  . Tiotropium Bromide-Olodaterol (STIOLTO RESPIMAT) 2.5-2.5 MCG/ACT AERS Inhale 2 puffs into the lungs daily. 2 Inhaler 0   No current facility-administered medications on file prior to visit.     No Known Allergies  Family History  Problem Relation Age of Onset  . Diabetes Mother   . Hypertension Mother   . Peripheral vascular disease Mother        amputation  . Diabetes Father        Right Leg Amputation-Gangrene  . Hypertension Father   . Peripheral vascular disease Father        amputation  . Cancer Father        Lead Poison-Ca  . Pneumonia Father   . Diabetes Sister   . Hypertension Sister   . Diabetes Daughter   . Hypertension Daughter     BP 132/82 (BP Location: Left Arm, Patient Position: Sitting, Cuff Size: Normal)   Pulse 88   Ht _0  (1.651 m)   Wt 193 lb (87.5 kg)   SpO2 90%   BMI 32.12 kg/m    Review of Systems He denies hypoglycemia    Objective:   Physical Exam VITAL SIGNS:  See vs page GENERAL: no distress Pulses: left foot pulses are intact.   MSK: no deformity of the feet or ankles.  CV: 1+ left leg edema.   Skin:  no ulcer on the left foot or ankle.  Normal temp on the feet.  dense hyperpigmentation on the left foot and leg.   Neuro: sensation is intact to touch on the feet , but severely decreased from normal.   Ext: right great toenail is absent.    Lab Results  Component Value Date   HGBA1C 7.7  (A) 01/12/2018   Lab Results  Component Value Date   CREATININE 0.97 12/02/2017  BUN 21 12/02/2017   NA 142 12/02/2017   K 4.8 12/02/2017   CL 97 12/02/2017   CO2 29 12/02/2017       Assessment & Plan:  Insulin-requiring type 2 DM, with PAD: this is the best control this pt should aim for, given this regimen, which does match insulin to her changing needs throughout the day Renal insuff: he needs 70/30 just qam.  Patient Instructions  check your blood sugar twice a day.  vary the time of day when you check, between before the 3 meals, and at bedtime.  also check if you have symptoms of your blood sugar being too high or too low.  please keep a record of the readings and bring it to your next appointment here (or you can bring the meter itself).  You can write it on any piece of paper.  please call us sooner if your blood sugar goes below 70, or if you have a lot of readings over 200.   Please continue the same insulin On this type of insulin schedule, you should eat meals on a regular schedule (especially lunch).  If a meal is missed or significantly delayed, your blood sugar could go low.   Please come back for a follow-up appointment in 2 months.

## 2018-01-20 ENCOUNTER — Ambulatory Visit (INDEPENDENT_AMBULATORY_CARE_PROVIDER_SITE_OTHER): Payer: Medicare Other | Admitting: Cardiology

## 2018-01-20 ENCOUNTER — Encounter: Payer: Self-pay | Admitting: Cardiology

## 2018-01-20 VITALS — BP 142/86 | HR 90 | Ht 64.0 in | Wt 192.4 lb

## 2018-01-20 DIAGNOSIS — R0789 Other chest pain: Secondary | ICD-10-CM | POA: Diagnosis not present

## 2018-01-20 NOTE — Patient Instructions (Signed)
Medication Instructions:  Your physician recommends that you continue on your current medications as directed. Please refer to the Current Medication list given to you today.   Labwork: -None  Testing/Procedures: -None  Follow-Up: As needed  Any Other Special Instructions Will Be Listed Below (If Applicable).     If you need a refill on your cardiac medications before your next appointment, please call your pharmacy.   

## 2018-01-20 NOTE — Progress Notes (Signed)
01/20/2018 Ivan Parks   29-Sep-1940  [id number]  Primary Physician Harlan Stains, MD Primary Cardiologist: Dr. Radford Pax  PV: Dr. Gwenlyn Found  Reason for Visit/CC: F/u for Atypical CP  HPI: This is a 77 year old male with a history of chronic lower extremity edema, diastolic CHF, COPD, diabetes mellitus, hypertension, PVD and hyperlipidemia.  He was recently referred to Dr. Radford Pax for evaluation for chest pain.  He was seen December 02, 2017.  His CP was described to be atypical.  In July, He noted chest pain on a daily basis lasting about an hour. It can be worse with movement but if he puts oxygen on it helps.  It is left-sided with no radiation and is not associated with nausea, diaphoresis or worsening of his chronic shortness of breath.  It is nonexertional.  He has chronic dyspnea on exertion due to underlying COPD and is on oxygen at 2.5L.   Dr. Radford Pax ordered a nuclear stress test as well as a 2D echocardiogram.  Both studies were normal.  Nuclear stress test showed no ischemia. Echo showed normal left ventricular EF with mild diastolic dysfunction.  Mild MR.  No change from prior echo.  Dr. Radford Pax felt that perhaps his chest discomfort was from acid reflux and recommended that he start Protonix.  This was called into his pharmacy.  He presents to clinic today with his daughter.  He reports compliance for Protonix but he has not noticed any improvement in symptoms.  His daughter states that his PCP felt that maybe it was from drinking too much coffee, however the patient refuses to give up his coffee.  He continues to deny any exertional symptoms.  Current Meds  Medication Sig  . acetaminophen (TYLENOL) 500 MG tablet Take 500 mg by mouth every 8 (eight) hours as needed for mild pain.   Marland Kitchen albuterol (PROVENTIL) (2.5 MG/3ML) 0.083% nebulizer solution Take 3 mLs (2.5 mg total) by nebulization every 6 (six) hours as needed for wheezing or shortness of breath.  Marland Kitchen atorvastatin (LIPITOR) 20 MG tablet  Take 20 mg by mouth daily at 6 PM.   . Blood Glucose Monitoring Suppl (ONETOUCH VERIO FLEX SYSTEM) w/Device KIT 2 each by Does not apply route 2 (two) times daily.  . furosemide (LASIX) 40 MG tablet Take 1 tablet (40 mg total) by mouth daily.  Marland Kitchen glucose blood (ONETOUCH VERIO) test strip Use to check blood sugar 2 times per day.  . insulin NPH-regular Human (HUMULIN 70/30) (70-30) 100 UNIT/ML injection Inject 60 Units into the skin daily with breakfast. and syringes 1/day.  . lisinopril (PRINIVIL,ZESTRIL) 20 MG tablet Take 20 mg by mouth daily.  Glory Rosebush DELICA LANCETS 01B MISC Use to check blood sugar 2 times per day.  . pantoprazole (PROTONIX) 40 MG tablet Take 1 tablet (40 mg total) by mouth daily.  . Tiotropium Bromide-Olodaterol (STIOLTO RESPIMAT) 2.5-2.5 MCG/ACT AERS Inhale 2 puffs into the lungs daily.  . Tiotropium Bromide-Olodaterol (STIOLTO RESPIMAT) 2.5-2.5 MCG/ACT AERS Inhale 2 puffs into the lungs daily.   No Known Allergies Past Medical History:  Diagnosis Date  . Acute respiratory failure ([place])   . AKI (acute kidney injury) ([place]) 04/01/2016  . Bilateral lower extremity edema   . Bronchitis   . Chest pain 12/02/2017  . CHF (congestive heart failure) ([place])   . Chronic respiratory failure ([place]) 11/26/2014  . COPD (chronic obstructive pulmonary disease) ([place])   . 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 Diabetes ([place]) 05/02/2016  . DM (diabetes mellitus) ([place])   . Essential hypertension 03/06/2015   hypertension   . HTN (hypertension)   . Hyperlipidemia   . Hypotension 03/31/2016  . Hypoxemia   . Leg edema, right- Greater than Left leg 07/05/2014   Lower extremity edema   . OSA (obstructive sleep apnea)   . Peripheral vascular disease ([place])   . Tobacco abuse    Family History  Problem Relation Age of Onset  . Diabetes Mother   .  Hypertension Mother   . Peripheral vascular disease Mother        amputation  . Diabetes Father        Right Leg Amputation-Gangrene  . Hypertension Father   . Peripheral vascular disease Father        amputation  . Cancer Father        Lead Poison-Ca  . Pneumonia Father   . Diabetes Sister   . Hypertension Sister   . Diabetes Daughter   . Hypertension Daughter    Past Surgical History:  Procedure Laterality Date  . COLON SURGERY     Social History   Socioeconomic History  . Marital status: Married    Spouse name: Not on file  . Number of children: 7  . Years of education: Not on file  . Highest education level: Not on file  Occupational History  . Occupation: retired  Scientific laboratory technician  . Financial resource strain: Not on file  . Food insecurity:    Worry: Not on file    Inability: Not on file  . Transportation needs:    Medical: Not on file    Non-medical: Not on file  Tobacco Use  . Smoking status: Former Smoker    Packs/day: 1.50    Years: 50.00    Pack years: 75.00    Types: Cigarettes    Last attempt to quit: 11/04/2010    Years since quitting: 7.2  . Smokeless tobacco: Never Used  Substance and Sexual Activity  . Alcohol use: No    Alcohol/week: 0.0 standard drinks  . Drug use: No  . Sexual activity: Not on file  Lifestyle  . Physical activity:    Days per week: Not on file    Minutes per session: Not on file  . Stress: Not on file  Relationships  . Social connections:    Talks on phone: Not on file    Gets together: Not on file    Attends religious service: Not on file    Active member of club or organization: Not on file    Attends meetings of clubs or organizations: Not on file    Relationship status: Not on file  . Intimate partner violence:    Fear of current or ex partner: Not on file    Emotionally abused: Not on file    Physically abused: Not on file    Forced sexual activity: Not on file  Other Topics Concern  . Not on file  Social  History Narrative  . Not on file     Review of Systems: General: negative for chills, fever, night sweats or weight changes.  Cardiovascular: negative for chest pain, dyspnea on exertion, edema, orthopnea, palpitations, paroxysmal nocturnal dyspnea or shortness of breath Dermatological: negative for rash Respiratory: negative for cough or wheezing Urologic: negative for hematuria Abdominal: negative for  nausea, vomiting, diarrhea, bright red blood per rectum, melena, or hematemesis Neurologic: negative for visual changes, syncope, or dizziness All other systems reviewed and are otherwise negative except as noted above.   Physical Exam:  Blood pressure (!) 142/86, pulse 90, height 5' 4" (1.626 m), weight 192 lb 6.4 oz (87.3 kg).  General appearance: alert, cooperative, no distress and moderately obese Neck: no carotid bruit and no JVD Lungs: clear to auscultation bilaterally Heart: regular rate and rhythm, S1, S2 normal, no murmur, click, rub or gallop Extremities: extremities normal, atraumatic, no cyanosis or edema Pulses: 2+ and symmetric Skin: Skin color, texture, turgor normal. No rashes or lesions Neurologic: Grossly normal  EKG not performed -- personally reviewed   ASSESSMENT AND PLAN:   1. Atypical CP: symptoms not c/w cardiac etiology and w/u including NST and 2D echo unremarkable. No ischemia. EF normal. No significant valvular abnormalities. Pro BNP also normal at 22. No further cardiac w/u advised.   Follow-Up PRN  Nelida Gores, MHS [facility] 01/20/2018 4:25 PM

## 2018-03-16 ENCOUNTER — Ambulatory Visit: Payer: Medicare Other | Admitting: Endocrinology

## 2018-03-22 ENCOUNTER — Encounter (HOSPITAL_BASED_OUTPATIENT_CLINIC_OR_DEPARTMENT_OTHER): Payer: Medicare Other | Attending: Internal Medicine

## 2018-03-22 DIAGNOSIS — I87332 Chronic venous hypertension (idiopathic) with ulcer and inflammation of left lower extremity: Secondary | ICD-10-CM | POA: Diagnosis not present

## 2018-03-22 DIAGNOSIS — G4733 Obstructive sleep apnea (adult) (pediatric): Secondary | ICD-10-CM | POA: Insufficient documentation

## 2018-03-22 DIAGNOSIS — E1151 Type 2 diabetes mellitus with diabetic peripheral angiopathy without gangrene: Secondary | ICD-10-CM | POA: Insufficient documentation

## 2018-03-22 DIAGNOSIS — Z794 Long term (current) use of insulin: Secondary | ICD-10-CM | POA: Insufficient documentation

## 2018-03-22 DIAGNOSIS — J449 Chronic obstructive pulmonary disease, unspecified: Secondary | ICD-10-CM | POA: Insufficient documentation

## 2018-03-22 DIAGNOSIS — I890 Lymphedema, not elsewhere classified: Secondary | ICD-10-CM | POA: Insufficient documentation

## 2018-03-22 DIAGNOSIS — L97822 Non-pressure chronic ulcer of other part of left lower leg with fat layer exposed: Secondary | ICD-10-CM | POA: Insufficient documentation

## 2018-03-22 DIAGNOSIS — E11622 Type 2 diabetes mellitus with other skin ulcer: Secondary | ICD-10-CM | POA: Insufficient documentation

## 2018-03-22 DIAGNOSIS — L97222 Non-pressure chronic ulcer of left calf with fat layer exposed: Secondary | ICD-10-CM | POA: Diagnosis not present

## 2018-03-22 DIAGNOSIS — Z9981 Dependence on supplemental oxygen: Secondary | ICD-10-CM | POA: Insufficient documentation

## 2018-03-22 DIAGNOSIS — L97312 Non-pressure chronic ulcer of right ankle with fat layer exposed: Secondary | ICD-10-CM | POA: Diagnosis not present

## 2018-03-29 DIAGNOSIS — I87332 Chronic venous hypertension (idiopathic) with ulcer and inflammation of left lower extremity: Secondary | ICD-10-CM | POA: Diagnosis not present

## 2018-03-31 ENCOUNTER — Encounter: Payer: Self-pay | Admitting: Endocrinology

## 2018-03-31 ENCOUNTER — Ambulatory Visit (INDEPENDENT_AMBULATORY_CARE_PROVIDER_SITE_OTHER): Payer: Medicare Other | Admitting: Endocrinology

## 2018-03-31 VITALS — BP 134/64 | HR 92 | Ht 64.0 in | Wt 193.2 lb

## 2018-03-31 DIAGNOSIS — N182 Chronic kidney disease, stage 2 (mild): Secondary | ICD-10-CM

## 2018-03-31 DIAGNOSIS — Z794 Long term (current) use of insulin: Secondary | ICD-10-CM | POA: Diagnosis not present

## 2018-03-31 DIAGNOSIS — E1122 Type 2 diabetes mellitus with diabetic chronic kidney disease: Secondary | ICD-10-CM

## 2018-03-31 LAB — POCT GLYCOSYLATED HEMOGLOBIN (HGB A1C): HEMOGLOBIN A1C: 9.8 % — AB (ref 4.0–5.6)

## 2018-03-31 MED ORDER — INSULIN NPH ISOPHANE & REGULAR (70-30) 100 UNIT/ML ~~LOC~~ SUSP: 70.0000 [IU] | Freq: Every day | SUBCUTANEOUS | 11 refills | Status: DC

## 2018-03-31 NOTE — Progress Notes (Signed)
Subjective:    Patient ID: Ivan Parks, male    DOB: February 20, 1941, 77 y.o.   MRN: [id number]  HPI Pt returns for f/u of diabetes mellitus: DM type: Insulin-requiring type 2.  Dx'ed: 2197 Complications: polyneuropathy, PAD, and renal insufficiency.   Therapy: insulin since 2016.   DKA: never Severe hypoglycemia: last episode was 2018.   Pancreatitis: never.   Other: he takes QD insulin, due to h/o noncompliance; he takes human insulin, due to cost; he has long duration of action of insulin, prob due to renal insuff; NPH was changed to 70/30, due to AM hypoglycemia and PM hyperglycemia.  Interval history: meter is downloaded, but there is only 1 cbg.  There is no trend throughout the day.  He takes 60 units qam.   Past Medical History:  Diagnosis Date  . Acute respiratory failure ([place])   . AKI (acute kidney injury) ([place]) 04/01/2016  . Bilateral lower extremity edema   . Bronchitis   . Chest pain 12/02/2017  . CHF (congestive heart failure) ([place])   . Chronic respiratory failure ([place]) 11/26/2014  . COPD (chronic obstructive pulmonary disease) ([place])   . 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 Diabetes ([place]) 05/02/2016  . DM (diabetes mellitus) ([place])   . Essential hypertension 03/06/2015   hypertension   . HTN (hypertension)   . Hyperlipidemia   . Hypotension 03/31/2016  . Hypoxemia   . Leg edema, right- Greater than Left leg 07/05/2014   Lower extremity edema   . OSA (obstructive sleep apnea)   . Peripheral vascular disease ([place])   . Tobacco abuse     Past Surgical History:  Procedure Laterality Date  . COLON SURGERY      Social History   Socioeconomic History  . Marital status: Married    Spouse name: Not on file  . Number of children: 7  . Years of education: Not on file  . Highest education level: Not on file  Occupational  History  . Occupation: retired  Scientific laboratory technician  . Financial resource strain: Not on file  . Food insecurity:    Worry: Not on file    Inability: Not on file  . Transportation needs:    Medical: Not on file    Non-medical: Not on file  Tobacco Use  . Smoking status: Former Smoker    Packs/day: 1.50    Years: 50.00    Pack years: 75.00    Types: Cigarettes    Last attempt to quit: 11/04/2010    Years since quitting: 7.4  . Smokeless tobacco: Never Used  Substance and Sexual Activity  . Alcohol use: No    Alcohol/week: 0.0 standard drinks  . Drug use: No  . Sexual activity: Not on file  Lifestyle  . Physical activity:    Days per week: Not on file    Minutes per session: Not on file  . Stress: Not on file  Relationships  . Social connections:    Talks on phone: Not on file    Gets together: Not on file    Attends religious service: Not on file    Active member of club or organization: Not on file    Attends meetings of clubs or organizations: Not on file    Relationship status: Not on file  . Intimate  partner violence:    Fear of current or ex partner: Not on file    Emotionally abused: Not on file    Physically abused: Not on file    Forced sexual activity: Not on file  Other Topics Concern  . Not on file  Social History Narrative  . Not on file    Current Outpatient Medications on File Prior to Visit  Medication Sig Dispense Refill  . acetaminophen (TYLENOL) 500 MG tablet Take 500 mg by mouth every 8 (eight) hours as needed for mild pain.     Marland Kitchen albuterol (PROVENTIL) (2.5 MG/3ML) 0.083% nebulizer solution Take 3 mLs (2.5 mg total) by nebulization every 6 (six) hours as needed for wheezing or shortness of breath. 360 mL 11  . atorvastatin (LIPITOR) 20 MG tablet Take 20 mg by mouth daily at 6 PM.     . Blood Glucose Monitoring Suppl (ONETOUCH VERIO FLEX SYSTEM) w/Device KIT 2 each by Does not apply route 2 (two) times daily. 1 kit 2  . furosemide (LASIX) 40 MG tablet  Take 1 tablet (40 mg total) by mouth daily. 90 tablet 3  . glucose blood (ONETOUCH VERIO) test strip Use to check blood sugar 2 times per day. 200 each 2  . lisinopril (PRINIVIL,ZESTRIL) 20 MG tablet Take 20 mg by mouth daily.    Glory Rosebush DELICA LANCETS 27X MISC Use to check blood sugar 2 times per day. 200 each 2  . pantoprazole (PROTONIX) 40 MG tablet Take 1 tablet (40 mg total) by mouth daily. 90 tablet 3  . Tiotropium Bromide-Olodaterol (STIOLTO RESPIMAT) 2.5-2.5 MCG/ACT AERS Inhale 2 puffs into the lungs daily. 3 Inhaler 1  . Tiotropium Bromide-Olodaterol (STIOLTO RESPIMAT) 2.5-2.5 MCG/ACT AERS Inhale 2 puffs into the lungs daily. 2 Inhaler 0   No current facility-administered medications on file prior to visit.     No Known Allergies  Family History  Problem Relation Age of Onset  . Diabetes Mother   . Hypertension Mother   . Peripheral vascular disease Mother        amputation  . Diabetes Father        Right Leg Amputation-Gangrene  . Hypertension Father   . Peripheral vascular disease Father        amputation  . Cancer Father        Lead Poison-Ca  . Pneumonia Father   . Diabetes Sister   . Hypertension Sister   . Diabetes Daughter   . Hypertension Daughter     BP 134/64 (BP Location: Right Arm, Patient Position: Sitting, Cuff Size: Large)   Pulse 92   Ht _0  (1.626 m)   Wt 193 lb 3.2 oz (87.6 kg)   SpO2 91%   BMI 33.16 kg/m    Review of Systems He denies hypoglycemia    Objective:   Physical Exam VITAL SIGNS:  See vs page GENERAL: no distress.  In wheelchair.  Has 02 on MSK: the toes are.  There is bilateral onychomycosis of the toenails Skin:  no ulcer on the toes.  Normal temp on the feet.  Dense hyperpigmentation on the distal feet.     Neuro: sensation is intact to touch on the distal feet , but severely decreased from normal.   Ext: right great toenail is absent. Legs and proximal feet are bandaged.    A1c=9.8%  Lab Results  Component Value  Date   CREATININE 0.97 12/02/2017   BUN 21 12/02/2017   NA 142 12/02/2017   K  4.8 12/02/2017   CL 97 12/02/2017   CO2 29 12/02/2017       Assessment & Plan:  Insulin-requiring type 2 DM, with PAD: worse Noncompliance with cbg recording: in this setting, we need to increase insulin slowly   Patient Instructions  check your blood sugar twice a day.  vary the time of day when you check, between before the 3 meals, and at bedtime.  also check if you have symptoms of your blood sugar being too high or too low.  please keep a record of the readings and bring it to your next appointment here (or you can bring the meter itself).  You can write it on any piece of paper.  please call us sooner if your blood sugar goes below 70, or if you have a lot of readings over 200.   Please increase the insulin to 70 units with breakfast.   On this type of insulin schedule, you should eat meals on a regular schedule (especially lunch).  If a meal is missed or significantly delayed, your blood sugar could go low.   Please come back for a follow-up appointment in 2 months.

## 2018-03-31 NOTE — Patient Instructions (Addendum)
check your blood sugar twice a day.  vary the time of day when you check, between before the 3 meals, and at bedtime.  also check if you have symptoms of your blood sugar being too high or too low.  please keep a record of the readings and bring it to your next appointment here (or you can bring the meter itself).  You can write it on any piece of paper.  please call us sooner if your blood sugar goes below 70, or if you have a lot of readings over 200.   Please increase the insulin to 70 units with breakfast.   On this type of insulin schedule, you should eat meals on a regular schedule (especially lunch).  If a meal is missed or significantly delayed, your blood sugar could go low.   Please come back for a follow-up appointment in 2 months.

## 2018-04-05 DIAGNOSIS — I87332 Chronic venous hypertension (idiopathic) with ulcer and inflammation of left lower extremity: Secondary | ICD-10-CM | POA: Diagnosis not present

## 2018-05-31 ENCOUNTER — Ambulatory Visit (INDEPENDENT_AMBULATORY_CARE_PROVIDER_SITE_OTHER): Payer: Medicare Other | Admitting: Endocrinology

## 2018-05-31 ENCOUNTER — Encounter: Payer: Self-pay | Admitting: Endocrinology

## 2018-05-31 VITALS — BP 118/72 | HR 81 | Ht 64.0 in | Wt 205.6 lb

## 2018-05-31 DIAGNOSIS — Z794 Long term (current) use of insulin: Secondary | ICD-10-CM

## 2018-05-31 DIAGNOSIS — E1122 Type 2 diabetes mellitus with diabetic chronic kidney disease: Secondary | ICD-10-CM | POA: Diagnosis not present

## 2018-05-31 DIAGNOSIS — N182 Chronic kidney disease, stage 2 (mild): Secondary | ICD-10-CM

## 2018-05-31 LAB — POCT GLYCOSYLATED HEMOGLOBIN (HGB A1C): Hemoglobin A1C: 8.4 % — AB (ref 4.0–5.6)

## 2018-05-31 MED ORDER — INSULIN NPH ISOPHANE & REGULAR (70-30) 100 UNIT/ML ~~LOC~~ SUSP: 72.0000 [IU] | Freq: Every day | SUBCUTANEOUS | 11 refills | Status: DC

## 2018-05-31 NOTE — Progress Notes (Signed)
Subjective:    Patient ID: Ivan Parks, male    DOB: 1941/05/18, 78 y.o.   MRN: [id number]  HPI Pt returns for f/u of diabetes mellitus: DM type: Insulin-requiring type 2.  Dx'ed: 2010 Complications: polyneuropathy, PAD, and renal insufficiency.   Therapy: insulin since 2016.   DKA: never Severe hypoglycemia: last episode was 2018.   Pancreatitis: never.   Other: he takes QD insulin, due to h/o noncompliance; he takes human insulin, due to cost; he has long duration of action of insulin, prob due to renal insuff; NPH was changed to 70/30, due to AM hypoglycemia and PM hyperglycemia.  Interval history: meter is downloaded, but there are only 5 cbg's.  It is in general higher as the day goes on.  Pt says he seldom misses the insulin, but he sometimes takes later on.  Past Medical History:  Diagnosis Date  . Acute respiratory failure (HCC)   . AKI (acute kidney injury) (HCC) 04/01/2016  . Bilateral lower extremity edema   . Bronchitis   . Chest pain 12/02/2017  . CHF (congestive heart failure) (HCC)   . Chronic respiratory failure (HCC) 11/26/2014  . COPD (chronic obstructive pulmonary disease) (HCC)   . 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 Diabetes (HCC) 05/02/2016  . DM (diabetes mellitus) (HCC)   . Essential hypertension 03/06/2015   hypertension   . HTN (hypertension)   . Hyperlipidemia   . Hypotension 03/31/2016  . Hypoxemia   . Leg edema, right- Greater than Left leg 07/05/2014   Lower extremity edema   . OSA (obstructive sleep apnea)   . Peripheral vascular disease (HCC)   . Tobacco abuse     Past Surgical History:  Procedure Laterality Date  . COLON SURGERY      Social History   Socioeconomic History  . Marital status: Married    Spouse name: Not on file  . Number of children: 7  . Years of education: Not on file    . Highest education level: Not on file  Occupational History  . Occupation: retired  Engineer, productionocial Needs  . Financial resource strain: Not on file  . Food insecurity:    Worry: Not on file    Inability: Not on file  . Transportation needs:    Medical: Not on file    Non-medical: Not on file  Tobacco Use  . Smoking status: Former Smoker    Packs/day: 1.50    Years: 50.00    Pack years: 75.00    Types: Cigarettes    Last attempt to quit: 11/04/2010    Years since quitting: 7.5  . Smokeless tobacco: Never Used  Substance and Sexual Activity  . Alcohol use: No    Alcohol/week: 0.0 standard drinks  . Drug use: No  . Sexual activity: Not on file  Lifestyle  . Physical activity:    Days per week: Not on file    Minutes per session: Not on file  . Stress: Not on file  Relationships  . Social connections:    Talks on phone: Not on file    Gets together: Not on file    Attends religious service: Not on file    Active member of club or organization: Not on file    Attends meetings of clubs or organizations: Not on file  Relationship status: Not on file  . Intimate partner violence:    Fear of current or ex partner: Not on file    Emotionally abused: Not on file    Physically abused: Not on file    Forced sexual activity: Not on file  Other Topics Concern  . Not on file  Social History Narrative  . Not on file    Current Outpatient Medications on File Prior to Visit  Medication Sig Dispense Refill  . acetaminophen (TYLENOL) 500 MG tablet Take 500 mg by mouth every 8 (eight) hours as needed for mild pain.     Marland Kitchen. albuterol (PROVENTIL) (2.5 MG/3ML) 0.083% nebulizer solution Take 3 mLs (2.5 mg total) by nebulization every 6 (six) hours as needed for wheezing or shortness of breath. 360 mL 11  . atorvastatin (LIPITOR) 20 MG tablet Take 20 mg by mouth daily at 6 PM.     . furosemide (LASIX) 40 MG tablet Take 1 tablet (40 mg total) by mouth daily. 90 tablet 3  . glucose blood (ONETOUCH  VERIO) test strip Use to check blood sugar 2 times per day. 200 each 2  . lisinopril (PRINIVIL,ZESTRIL) 20 MG tablet Take 20 mg by mouth daily.    [name]. ONETOUCH DELICA LANCETS 33G MISC Use to check blood sugar 2 times per day. 200 each 2  . pantoprazole (PROTONIX) 40 MG tablet Take 1 tablet (40 mg total) by mouth daily. 90 tablet 3  . Tiotropium Bromide-Olodaterol (STIOLTO RESPIMAT) 2.5-2.5 MCG/ACT AERS Inhale 2 puffs into the lungs daily. 3 Inhaler 1  . Tiotropium Bromide-Olodaterol (STIOLTO RESPIMAT) 2.5-2.5 MCG/ACT AERS Inhale 2 puffs into the lungs daily. 2 Inhaler 0   No current facility-administered medications on file prior to visit.     No Known Allergies  Family History  Problem Relation Age of Onset  . Diabetes Mother   . Hypertension Mother   . Peripheral vascular disease Mother        amputation  . Diabetes Father        Right Leg Amputation-Gangrene  . Hypertension Father   . Peripheral vascular disease Father        amputation  . Cancer Father        Lead Poison-Ca  . Pneumonia Father   . Diabetes Sister   . Hypertension Sister   . Diabetes Daughter   . Hypertension Daughter     BP 118/72 (BP Location: Left Arm, Patient Position: Sitting, Cuff Size: Normal)   Pulse 81   Ht 5\' 4"  (1.626 m)   Wt 205 lb 9.6 oz (93.3 kg)   SpO2 91%   BMI 35.29 kg/m    Review of Systems He denies hypoglycemia.      Objective:   Physical Exam VITAL SIGNS:  See vs page GENERAL: no distress.  In wheelchair.  Ext: Legs and proximal feet are bandaged (sees wound care).   Lab Results  Component Value Date   CREATININE 0.97 12/02/2017   BUN 21 12/02/2017   NA 142 12/02/2017   K 4.8 12/02/2017   CL 97 12/02/2017   CO2 29 12/02/2017   A1c=8.4%     Assessment & Plan:  Insulin-requiring type 2 DM, with PAD Renal insuff: in this setting, he does not need a PM insulin dose.   Patient Instructions  check your blood sugar twice a day.  vary the time of day when you check,  between before the 3 meals, and at bedtime.  also check if you have symptoms  of your blood sugar being too high or too low.  please keep a record of the readings and bring it to your next appointment here (or you can bring the meter itself).  You can write it on any piece of paper.  please call us sooner if your blood sugar goes below 70, or if you have a lot of readings over 200.   Please increase the insulin to 72 units with breakfast.   On this type of insulin schedule, you should eat meals on a regular schedule (especially lunch).  If a meal is missed or significantly delayed, your blood sugar could go low.   Please come back for a follow-up appointment in 2 months.

## 2018-05-31 NOTE — Patient Instructions (Addendum)
check your blood sugar twice a day.  vary the time of day when you check, between before the 3 meals, and at bedtime.  also check if you have symptoms of your blood sugar being too high or too low.  please keep a record of the readings and bring it to your next appointment here (or you can bring the meter itself).  You can write it on any piece of paper.  please call us sooner if your blood sugar goes below 70, or if you have a lot of readings over 200.   Please increase the insulin to 72 units with breakfast.   On this type of insulin schedule, you should eat meals on a regular schedule (especially lunch).  If a meal is missed or significantly delayed, your blood sugar could go low.   Please come back for a follow-up appointment in 2 months.

## 2018-06-01 ENCOUNTER — Telehealth: Payer: Self-pay | Admitting: Emergency Medicine

## 2018-06-01 ENCOUNTER — Encounter (HOSPITAL_BASED_OUTPATIENT_CLINIC_OR_DEPARTMENT_OTHER): Payer: Medicare Other | Attending: Physician Assistant

## 2018-06-01 DIAGNOSIS — Z87891 Personal history of nicotine dependence: Secondary | ICD-10-CM | POA: Insufficient documentation

## 2018-06-01 DIAGNOSIS — J449 Chronic obstructive pulmonary disease, unspecified: Secondary | ICD-10-CM | POA: Insufficient documentation

## 2018-06-01 DIAGNOSIS — Z9981 Dependence on supplemental oxygen: Secondary | ICD-10-CM | POA: Insufficient documentation

## 2018-06-01 DIAGNOSIS — Z794 Long term (current) use of insulin: Secondary | ICD-10-CM | POA: Diagnosis not present

## 2018-06-01 DIAGNOSIS — G4730 Sleep apnea, unspecified: Secondary | ICD-10-CM | POA: Diagnosis not present

## 2018-06-01 DIAGNOSIS — L97822 Non-pressure chronic ulcer of other part of left lower leg with fat layer exposed: Secondary | ICD-10-CM | POA: Insufficient documentation

## 2018-06-01 DIAGNOSIS — L97219 Non-pressure chronic ulcer of right calf with unspecified severity: Secondary | ICD-10-CM | POA: Insufficient documentation

## 2018-06-01 DIAGNOSIS — L97812 Non-pressure chronic ulcer of other part of right lower leg with fat layer exposed: Secondary | ICD-10-CM | POA: Insufficient documentation

## 2018-06-01 DIAGNOSIS — I890 Lymphedema, not elsewhere classified: Secondary | ICD-10-CM | POA: Insufficient documentation

## 2018-06-01 DIAGNOSIS — E11622 Type 2 diabetes mellitus with other skin ulcer: Secondary | ICD-10-CM | POA: Diagnosis not present

## 2018-06-01 DIAGNOSIS — I10 Essential (primary) hypertension: Secondary | ICD-10-CM | POA: Diagnosis not present

## 2018-06-01 DIAGNOSIS — I87333 Chronic venous hypertension (idiopathic) with ulcer and inflammation of bilateral lower extremity: Secondary | ICD-10-CM | POA: Insufficient documentation

## 2018-06-01 NOTE — Telephone Encounter (Signed)
Patients Daughter brought in some [employer] forms to be filled out by Dr. Delton Coombes. The forms were placed in to Dr. Kavin Leech box//fmb

## 2018-06-01 NOTE — Telephone Encounter (Signed)
Forms have been retrieved from up front. They have been filled out and placed in RB's sign folder. Will route message to myself for follow up.

## 2018-06-07 NOTE — Telephone Encounter (Signed)
RB just returned to the office today. Forms have been given to him. Will update chart once they are returned to me. 

## 2018-06-07 NOTE — Telephone Encounter (Signed)
RB back in office 06/07/18.  Lillia Abed to follow up.

## 2018-06-08 DIAGNOSIS — I87333 Chronic venous hypertension (idiopathic) with ulcer and inflammation of bilateral lower extremity: Secondary | ICD-10-CM | POA: Diagnosis not present

## 2018-06-08 NOTE — Telephone Encounter (Signed)
Forms have been returned to me signed. They have been faxed to [employer]. Pt's daughter has been made aware. Nothing further was needed.

## 2018-06-15 DIAGNOSIS — I87333 Chronic venous hypertension (idiopathic) with ulcer and inflammation of bilateral lower extremity: Secondary | ICD-10-CM | POA: Diagnosis not present

## 2018-06-22 ENCOUNTER — Encounter (HOSPITAL_BASED_OUTPATIENT_CLINIC_OR_DEPARTMENT_OTHER): Payer: Medicare Other | Attending: Internal Medicine

## 2018-06-22 DIAGNOSIS — I110 Hypertensive heart disease with heart failure: Secondary | ICD-10-CM | POA: Insufficient documentation

## 2018-06-22 DIAGNOSIS — L97812 Non-pressure chronic ulcer of other part of right lower leg with fat layer exposed: Secondary | ICD-10-CM | POA: Insufficient documentation

## 2018-06-22 DIAGNOSIS — L97822 Non-pressure chronic ulcer of other part of left lower leg with fat layer exposed: Secondary | ICD-10-CM | POA: Insufficient documentation

## 2018-06-22 DIAGNOSIS — Z9981 Dependence on supplemental oxygen: Secondary | ICD-10-CM | POA: Diagnosis not present

## 2018-06-22 DIAGNOSIS — Z87891 Personal history of nicotine dependence: Secondary | ICD-10-CM | POA: Diagnosis not present

## 2018-06-22 DIAGNOSIS — E11622 Type 2 diabetes mellitus with other skin ulcer: Secondary | ICD-10-CM | POA: Diagnosis not present

## 2018-06-22 DIAGNOSIS — G4730 Sleep apnea, unspecified: Secondary | ICD-10-CM | POA: Diagnosis not present

## 2018-06-22 DIAGNOSIS — I890 Lymphedema, not elsewhere classified: Secondary | ICD-10-CM | POA: Diagnosis not present

## 2018-06-22 DIAGNOSIS — I87333 Chronic venous hypertension (idiopathic) with ulcer and inflammation of bilateral lower extremity: Secondary | ICD-10-CM | POA: Diagnosis not present

## 2018-06-22 DIAGNOSIS — J449 Chronic obstructive pulmonary disease, unspecified: Secondary | ICD-10-CM | POA: Insufficient documentation

## 2018-06-22 DIAGNOSIS — I509 Heart failure, unspecified: Secondary | ICD-10-CM | POA: Diagnosis not present

## 2018-06-29 DIAGNOSIS — I87333 Chronic venous hypertension (idiopathic) with ulcer and inflammation of bilateral lower extremity: Secondary | ICD-10-CM | POA: Diagnosis not present

## 2018-07-06 DIAGNOSIS — I87333 Chronic venous hypertension (idiopathic) with ulcer and inflammation of bilateral lower extremity: Secondary | ICD-10-CM | POA: Diagnosis not present

## 2018-07-13 DIAGNOSIS — I87333 Chronic venous hypertension (idiopathic) with ulcer and inflammation of bilateral lower extremity: Secondary | ICD-10-CM | POA: Diagnosis not present

## 2018-08-02 ENCOUNTER — Ambulatory Visit (INDEPENDENT_AMBULATORY_CARE_PROVIDER_SITE_OTHER): Payer: Medicare Other | Admitting: Endocrinology

## 2018-08-02 ENCOUNTER — Telehealth: Payer: Self-pay | Admitting: Endocrinology

## 2018-08-02 ENCOUNTER — Other Ambulatory Visit: Payer: Self-pay

## 2018-08-02 ENCOUNTER — Encounter: Payer: Self-pay | Admitting: Endocrinology

## 2018-08-02 VITALS — BP 160/80 | HR 95 | Ht 64.0 in | Wt 192.0 lb

## 2018-08-02 DIAGNOSIS — E1122 Type 2 diabetes mellitus with diabetic chronic kidney disease: Secondary | ICD-10-CM

## 2018-08-02 DIAGNOSIS — Z794 Long term (current) use of insulin: Secondary | ICD-10-CM

## 2018-08-02 DIAGNOSIS — N182 Chronic kidney disease, stage 2 (mild): Secondary | ICD-10-CM

## 2018-08-02 LAB — POCT GLYCOSYLATED HEMOGLOBIN (HGB A1C): Hemoglobin A1C: 10.1 % — AB (ref 4.0–5.6)

## 2018-08-02 MED ORDER — "INSULIN SYRINGE-NEEDLE U-100 31G X 5/16"" 1 ML MISC": 1.0000 | Freq: Every day | 3 refills | Status: DC

## 2018-08-02 MED ORDER — INSULIN NPH ISOPHANE & REGULAR (70-30) 100 UNIT/ML ~~LOC~~ SUSP: 80.0000 [IU] | Freq: Every day | SUBCUTANEOUS | 11 refills | Status: DC

## 2018-08-02 NOTE — Progress Notes (Signed)
Subjective:    Patient ID: Ivan Parks, male    DOB: [birth year], 78 y.o.   MRN: [id number]  HPI Pt returns for f/u of diabetes mellitus: DM type: Insulin-requiring type 2.  Dx'ed: 2010 Complications: polyneuropathy and PAD.   Therapy: insulin since 2016.   DKA: never Severe hypoglycemia: last episode was 2018.   Pancreatitis: never.   Other: he takes QD insulin, due to h/o noncompliance; he takes human insulin, due to cost; he has long duration of action of insulin; NPH was changed to 70/30, due to AM hypoglycemia and PM hyperglycemia; rx is limited by memory loss; dtr brings him to appts, but he declines help from family at home.    Interval history: meter is downloaded, but there are only 5 cbg's, checked 10 AM-1 PM.  cbg varies from 110-199.  Pt says he never misses the insulin, and he always takes with breakfast.   Past Medical History:  Diagnosis Date  . Acute respiratory failure (HCC)   . AKI (acute kidney injury) (HCC) 04/01/2016  . Bilateral lower extremity edema   . Bronchitis   . Chest pain 12/02/2017  . CHF (congestive heart failure) (HCC)   . Chronic respiratory failure (HCC) 11/26/2014  . COPD (chronic obstructive pulmonary disease) (HCC)   . 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 Diabetes (HCC) 05/02/2016  . DM (diabetes mellitus) (HCC)   . Essential hypertension 03/06/2015   hypertension   . HTN (hypertension)   . Hyperlipidemia   . Hypotension 03/31/2016  . Hypoxemia   . Leg edema, right- Greater than Left leg 07/05/2014   Lower extremity edema   . OSA (obstructive sleep apnea)   . Peripheral vascular disease (HCC)   . Tobacco abuse     Past Surgical History:  Procedure Laterality Date  . COLON SURGERY      Social History   Socioeconomic History  . Marital status: Married    Spouse name: Not on file  .  Number of children: 7  . Years of education: Not on file  . Highest education level: Not on file  Occupational History  . Occupation: retired  Engineer, production  . Financial resource strain: Not on file  . Food insecurity:    Worry: Not on file    Inability: Not on file  . Transportation needs:    Medical: Not on file    Non-medical: Not on file  Tobacco Use  . Smoking status: Former Smoker    Packs/day: 1.50    Years: 50.00    Pack years: 75.00    Types: Cigarettes    Last attempt to quit: 11/04/2010    Years since quitting: 7.7  . Smokeless tobacco: Never Used  Substance and Sexual Activity  . Alcohol use: No    Alcohol/week: 0.0 standard drinks  . Drug use: No  . Sexual activity: Not on file  Lifestyle  . Physical activity:    Days per week: Not on file    Minutes per session: Not on file  . Stress: Not on file  Relationships  . Social connections:    Talks on phone: Not on file    Gets together: Not on file    Attends religious service: Not on file    Active member of club or organization: Not on file  Attends meetings of clubs or organizations: Not on file    Relationship status: Not on file  . Intimate partner violence:    Fear of current or ex partner: Not on file    Emotionally abused: Not on file    Physically abused: Not on file    Forced sexual activity: Not on file  Other Topics Concern  . Not on file  Social History Narrative  . Not on file    Current Outpatient Medications on File Prior to Visit  Medication Sig Dispense Refill  . acetaminophen (TYLENOL) 500 MG tablet Take 500 mg by mouth every 8 (eight) hours as needed for mild pain.     Marland Kitchen albuterol (PROVENTIL) (2.5 MG/3ML) 0.083% nebulizer solution Take 3 mLs (2.5 mg total) by nebulization every 6 (six) hours as needed for wheezing or shortness of breath. 360 mL 11  . atorvastatin (LIPITOR) 20 MG tablet Take 20 mg by mouth daily at 6 PM.     . furosemide (LASIX) 40 MG tablet Take 1 tablet (40 mg  total) by mouth daily. 90 tablet 3  . glucose blood (ONETOUCH VERIO) test strip Use to check blood sugar 2 times per day. 200 each 2  . lisinopril (PRINIVIL,ZESTRIL) 20 MG tablet Take 20 mg by mouth daily.    [name] DELICA LANCETS 33G MISC Use to check blood sugar 2 times per day. 200 each 2  . pantoprazole (PROTONIX) 40 MG tablet Take 1 tablet (40 mg total) by mouth daily. 90 tablet 3  . Tiotropium Bromide-Olodaterol (STIOLTO RESPIMAT) 2.5-2.5 MCG/ACT AERS Inhale 2 puffs into the lungs daily. 3 Inhaler 1  . Tiotropium Bromide-Olodaterol (STIOLTO RESPIMAT) 2.5-2.5 MCG/ACT AERS Inhale 2 puffs into the lungs daily. 2 Inhaler 0   No current facility-administered medications on file prior to visit.     No Known Allergies  Family History  Problem Relation Age of Onset  . Diabetes Mother   . Hypertension Mother   . Peripheral vascular disease Mother        amputation  . Diabetes Father        Right Leg Amputation-Gangrene  . Hypertension Father   . Peripheral vascular disease Father        amputation  . Cancer Father        Lead Poison-Ca  . Pneumonia Father   . Diabetes Sister   . Hypertension Sister   . Diabetes Daughter   . Hypertension Daughter     BP (!) 160/80 (BP Location: Left Arm, Patient Position: Sitting, Cuff Size: Large) Comment: did not take antihypertensive  Pulse 95   Ht 5\' 4"  (1.626 m)   Wt 192 lb (87.1 kg)   SpO2 (!) 89%   BMI 32.96 kg/m    Review of Systems He denies hypoglycemia.    Objective:   Physical Exam VITAL SIGNS:  See vs page GENERAL: no distress.  In wheelchair.  Pulses: dorsalis pedis intact bilat.   MSK: no deformity of the feet CV: 2+ bilat leg edema Skin:  no ulcer on the feet.  normal color and temp on the feet. Neuro: sensation is intact to touch on the feet Ext: There is bilateral onychomycosis of the toenails.  Right great toenail is absent   Lab Results  Component Value Date   HGBA1C 10.1 (A) 08/02/2018   Lab Results   Component Value Date   CREATININE 0.97 12/02/2017   BUN 21 12/02/2017   NA 142 12/02/2017   K 4.8 12/02/2017  CL 97 12/02/2017   CO2 29 12/02/2017      Assessment & Plan:  Insulin-requiring type 2 DM, with PAD: worse HTN: is noted today Noncompliance with cbg recording: in this context, we need to increase insulin slowly   Patient Instructions  Your blood pressure is high today.  Please see your primary care provider soon, to have it rechecked check your blood sugar twice a day.  vary the time of day when you check, between before the 3 meals, and at bedtime.  also check if you have symptoms of your blood sugar being too high or too low.  please keep a record of the readings and bring it to your next appointment here (or you can bring the meter itself).  You can write it on any piece of paper.  please call us sooner if your blood sugar goes below 70, or if you have a lot of readings over 200.   Please increase the insulin to 80 units with breakfast.   On this type of insulin schedule, you should eat meals on a regular schedule (especially lunch).  If a meal is missed or significantly delayed, your blood sugar could go low.   Please come back for a follow-up appointment in 2 months.

## 2018-08-02 NOTE — Telephone Encounter (Signed)
Please refer to pharmacy request

## 2018-08-02 NOTE — Telephone Encounter (Signed)
Pharmacy just called stating they will need a separate RX for patients saringes for there Humilin.  Please Advise, thanks

## 2018-08-02 NOTE — Patient Instructions (Addendum)
Your blood pressure is high today.  Please see your primary care provider soon, to have it rechecked check your blood sugar twice a day.  vary the time of day when you check, between before the 3 meals, and at bedtime.  also check if you have symptoms of your blood sugar being too high or too low.  please keep a record of the readings and bring it to your next appointment here (or you can bring the meter itself).  You can write it on any piece of paper.  please call us sooner if your blood sugar goes below 70, or if you have a lot of readings over 200.   Please increase the insulin to 80 units with breakfast.   On this type of insulin schedule, you should eat meals on a regular schedule (especially lunch).  If a meal is missed or significantly delayed, your blood sugar could go low.   Please come back for a follow-up appointment in 2 months.

## 2018-08-02 NOTE — Telephone Encounter (Signed)
rx sent

## 2018-08-22 ENCOUNTER — Telehealth: Payer: Self-pay | Admitting: Endocrinology

## 2018-08-22 NOTE — Telephone Encounter (Signed)
Patient's Wife would like a call back to discuss the patient's insulin and when he should be eating with this.    insulin NPH-regular Human (HUMULIN 70/30) (70-30) 100 UNIT/ML injection

## 2018-08-22 NOTE — Telephone Encounter (Signed)
LVM requesting returned call 

## 2018-08-24 ENCOUNTER — Telehealth: Payer: Self-pay | Admitting: Endocrinology

## 2018-08-24 NOTE — Telephone Encounter (Signed)
From yesterday:  Patient's Wife would like a call back to discuss the patient's insulin and when he should be eating with this.    insulin NPH-regular Human (HUMULIN 70/30) (70-30) 100 UNIT/ML injection  Please advise if you provide pt with specific instructions re: his diet and this insulin.

## 2018-08-24 NOTE — Telephone Encounter (Signed)
Called pt wife and informed of Dr. George Hugh response. Verbalized acceptance and understanding.

## 2018-08-24 NOTE — Telephone Encounter (Signed)
Patient's wife is returning a call from our office

## 2018-08-24 NOTE — Telephone Encounter (Signed)
Insulin should be taken with first meal of the day.  General healthy diet is advised

## 2018-09-08 ENCOUNTER — Telehealth: Payer: Self-pay | Admitting: General Practice

## 2018-09-08 NOTE — Telephone Encounter (Signed)
Called and spoke with Pt at daughters request. Pt advised that since Dr. Everardo All has increased his insulin dosage he is experiencing chills, blurry vision, speech issues, and feels dizzy when walking (though he claims he has had this issue previously). .   Pt advised that he typically takes his insulin before his breakfast. Breakfast typically is 2 eggs, 2 pieces of bacon , and 2 pieces of toast. He has a sandwich for lunch, and a good dinner. Pt states that there are some days that he is not hungry and just doesn't feel like eating  Wife advised before she put pt on the phone that he is not eating. EMS and his entire family have told him that he cannot take the insulin on an empty stomach.   Pt would like to know if he should decrease his insulin dose because there are some days his reading are around 34.   Please advise?

## 2018-09-08 NOTE — Telephone Encounter (Signed)
This is a difficult situation.  We'll continue to do our best.  What time of day is it going low?

## 2018-09-09 NOTE — Telephone Encounter (Signed)
Called and advised pt wife of Dr. George Hugh recommendations. They will change the dose and ensure pt is eating and will call office if anything changes.

## 2018-09-09 NOTE — Telephone Encounter (Signed)
I assume this means before breakfast.  Please verify.  Then please reduce insulin to 75 units with breakfast.   I'll see you next time.

## 2018-09-09 NOTE — Telephone Encounter (Signed)
Pt stated that he has noticed the lowest numbers in the morning.

## 2018-09-12 NOTE — Telephone Encounter (Signed)
Patients wife has called wanting to know the next steps for her husband.  Please Advise, Thanks

## 2018-09-14 ENCOUNTER — Telehealth: Payer: Self-pay | Admitting: Endocrinology

## 2018-09-14 NOTE — Telephone Encounter (Signed)
please contact patient's wife. Please reduce insulin to 75 units with breakfast. F/u next week

## 2018-09-14 NOTE — Telephone Encounter (Signed)
Per [facility], "Caller states that her husband has had blood sugar problems and she has a question about his insulin. Indicates that someone called from the office and indicated that if his blood sugar is 59 or less not to take the Humulin but if it is over 100 to take it. Caller indicates that he is supposed to take the insulin one time in the morning. His blood sugar was 59 yesterday morning and they did not give the Humulin but he did not take it this morning. His blood sugar at this time is over 300. Caller wants to know if they should take the humulin now."  Placed in doctors box.

## 2018-09-14 NOTE — Telephone Encounter (Signed)
LVM requesting returned call 

## 2018-09-16 ENCOUNTER — Telehealth: Payer: Self-pay | Admitting: Endocrinology

## 2018-09-16 NOTE — Telephone Encounter (Signed)
Wife Ivan Parks) called into [facility] and spoke with Nurse on 09/15/18 at 4:02pm - pt has had EMS called 3 times in the last week due to low BS levels. EMS evaluated and treated patient in home giving OJ and Peanut Butter sandwiches. Instructed that if BS below 59 not to take the Insulin. Wife states that she has been making sure that the patient is eating; that seemed to the be the issue if the patient was not eating and was taking his insulin. Wife told [facility] RN that she plans to take the patient to the Hospital if he has another low BS.   Pt wife wanted to make Dr Everardo All aware of the reason for the low BS levels.   Will send as FYI and for recommendations.

## 2018-09-16 NOTE — Telephone Encounter (Signed)
See TE 09/16/18 This has been addressed. Pt is having OV 09/19/18 Nothing further needed.

## 2018-09-16 NOTE — Telephone Encounter (Signed)
Pt scheduled for Monday 09/19/18 at 3:30 with Dr Everardo All.  Aware of recommendations. Nothing further needed.

## 2018-09-16 NOTE — Telephone Encounter (Signed)
Please reduce insulin to 65 units with breakfast. F/u next week

## 2018-09-19 ENCOUNTER — Other Ambulatory Visit: Payer: Self-pay

## 2018-09-19 ENCOUNTER — Encounter: Payer: Self-pay | Admitting: Endocrinology

## 2018-09-19 ENCOUNTER — Ambulatory Visit (INDEPENDENT_AMBULATORY_CARE_PROVIDER_SITE_OTHER): Payer: Medicare Other | Admitting: Endocrinology

## 2018-09-19 DIAGNOSIS — E1122 Type 2 diabetes mellitus with diabetic chronic kidney disease: Secondary | ICD-10-CM | POA: Diagnosis not present

## 2018-09-19 DIAGNOSIS — N182 Chronic kidney disease, stage 2 (mild): Secondary | ICD-10-CM

## 2018-09-19 DIAGNOSIS — Z794 Long term (current) use of insulin: Secondary | ICD-10-CM | POA: Diagnosis not present

## 2018-09-19 MED ORDER — INSULIN NPH ISOPHANE & REGULAR (70-30) 100 UNIT/ML ~~LOC~~ SUSP: 55.0000 [IU] | Freq: Every day | SUBCUTANEOUS | 11 refills | Status: DC

## 2018-09-19 NOTE — Patient Instructions (Addendum)
check your blood sugar twice a day.  vary the time of day when you check, between before the 3 meals, and at bedtime.  also check if you have symptoms of your blood sugar being too high or too low.  please keep a record of the readings and bring it to your next appointment here (or you can bring the meter itself).  You can write it on any piece of paper.  please call us sooner if your blood sugar goes below 70, or if you have a lot of readings over 200.   Please increase the insulin to 55 units with breakfast.   On this type of insulin schedule, you should eat meals on a regular schedule (especially lunch).  If a meal is missed or significantly delayed, your blood sugar could go low.   Please come back for a follow-up appointment in 2-4 weeks.

## 2018-09-19 NOTE — Progress Notes (Signed)
Subjective:    Patient ID: Ivan Parks, male    DOB: [birth year], 78 y.o.   MRN: [id number]  HPI  telehealth visit today via Phone x 10 minutes. Alternatives to telehealth are presented to this patient, and the patient agrees to the telehealth visit.   Pt is advised of the cost of the visit, and agrees to this, also.   Patient is at home, and I am at the office.   Persons attending the telehealth visit: the patient, wife, and I.   Pt returns for f/u of diabetes mellitus: DM type: Insulin-requiring type 2.  Dx'ed: 2010 Complications: polyneuropathy and PAD.   Therapy: insulin since 2016.   DKA: never Severe hypoglycemia: last episode was early 2020.   Pancreatitis: never.   Other: he takes QD insulin, due to h/o noncompliance; he takes human insulin, due to cost; he has long duration of action of insulin; NPH was changed to 70/30, due to AM hypoglycemia and PM hyperglycemia; rx is limited by memory loss; dtr brings him to appts, but he declines help from family at home.    Interval history: Pt recently had an episode of severe hypoglycemia at 4 PM.  However, wife says he has have hypoglycemia at any time of day, including fasting.  He takes 50 units qam.  He says he never misses the insulin.  Since the reduction, he says cbg varies from 135-275.   Past Medical History:  Diagnosis Date  . Acute respiratory failure (HCC)   . AKI (acute kidney injury) (HCC) 04/01/2016  . Bilateral lower extremity edema   . Bronchitis   . Chest pain 12/02/2017  . CHF (congestive heart failure) (HCC)   . Chronic respiratory failure (HCC) 11/26/2014  . COPD (chronic obstructive pulmonary disease) (HCC)   . 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 Diabetes (HCC) 05/02/2016  . DM (diabetes mellitus) (HCC)   . Essential hypertension 03/06/2015   hypertension   . HTN  (hypertension)   . Hyperlipidemia   . Hypotension 03/31/2016  . Hypoxemia   . Leg edema, right- Greater than Left leg 07/05/2014   Lower extremity edema   . OSA (obstructive sleep apnea)   . Peripheral vascular disease (HCC)   . Tobacco abuse     Past Surgical History:  Procedure Laterality Date  . COLON SURGERY      Social History   Socioeconomic History  . Marital status: Married    Spouse name: Not on file  . Number of children: 7  . Years of education: Not on file  . Highest education level: Not on file  Occupational History  . Occupation: retired  Engineer, production  . Financial resource strain: Not on file  . Food insecurity:    Worry: Not on file    Inability: Not on file  . Transportation needs:    Medical: Not on file    Non-medical: Not on file  Tobacco Use  . Smoking status: Former Smoker    Packs/day: 1.50    Years: 50.00    Pack years: 75.00    Types: Cigarettes    Last attempt to quit: 11/04/2010    Years since quitting: 7.8  . Smokeless tobacco: Never Used  Substance and Sexual Activity  . Alcohol use: No    Alcohol/week: 0.0 standard drinks  .  Drug use: No  . Sexual activity: Not on file  Lifestyle  . Physical activity:    Days per week: Not on file    Minutes per session: Not on file  . Stress: Not on file  Relationships  . Social connections:    Talks on phone: Not on file    Gets together: Not on file    Attends religious service: Not on file    Active member of club or organization: Not on file    Attends meetings of clubs or organizations: Not on file    Relationship status: Not on file  . Intimate partner violence:    Fear of current or ex partner: Not on file    Emotionally abused: Not on file    Physically abused: Not on file    Forced sexual activity: Not on file  Other Topics Concern  . Not on file  Social History Narrative  . Not on file    Current Outpatient Medications on File Prior to Visit  Medication Sig Dispense Refill   . acetaminophen (TYLENOL) 500 MG tablet Take 500 mg by mouth every 8 (eight) hours as needed for mild pain.     Marland Kitchen albuterol (PROVENTIL) (2.5 MG/3ML) 0.083% nebulizer solution Take 3 mLs (2.5 mg total) by nebulization every 6 (six) hours as needed for wheezing or shortness of breath. 360 mL 11  . atorvastatin (LIPITOR) 20 MG tablet Take 20 mg by mouth daily at 6 PM.     . furosemide (LASIX) 40 MG tablet Take 1 tablet (40 mg total) by mouth daily. 90 tablet 3  . glucose blood (ONETOUCH VERIO) test strip Use to check blood sugar 2 times per day. 200 each 2  . Insulin Syringe-Needle U-100 (RELION INSULIN SYRINGE 1ML/31G) 31G X 5/16" 1 ML MISC 1 Device by Does not apply route daily. 100 each 3  . lisinopril (PRINIVIL,ZESTRIL) 20 MG tablet Take 20 mg by mouth daily.    [name] DELICA LANCETS 33G MISC Use to check blood sugar 2 times per day. 200 each 2  . pantoprazole (PROTONIX) 40 MG tablet Take 1 tablet (40 mg total) by mouth daily. 90 tablet 3  . Tiotropium Bromide-Olodaterol (STIOLTO RESPIMAT) 2.5-2.5 MCG/ACT AERS Inhale 2 puffs into the lungs daily. 3 Inhaler 1  . Tiotropium Bromide-Olodaterol (STIOLTO RESPIMAT) 2.5-2.5 MCG/ACT AERS Inhale 2 puffs into the lungs daily. 2 Inhaler 0   No current facility-administered medications on file prior to visit.     No Known Allergies  Family History  Problem Relation Age of Onset  . Diabetes Mother   . Hypertension Mother   . Peripheral vascular disease Mother        amputation  . Diabetes Father        Right Leg Amputation-Gangrene  . Hypertension Father   . Peripheral vascular disease Father        amputation  . Cancer Father        Lead Poison-Ca  . Pneumonia Father   . Diabetes Sister   . Hypertension Sister   . Diabetes Daughter   . Hypertension Daughter    Review of Systems No weight     Objective:   Physical Exam      Assessment & Plan:  Insulin-requiring type 2 DM, with PAD: he needs increased rx.     Patient  Instructions  check your blood sugar twice a day.  vary the time of day when you check, between before the 3 meals, and at  bedtime.  also check if you have symptoms of your blood sugar being too high or too low.  please keep a record of the readings and bring it to your next appointment here (or you can bring the meter itself).  You can write it on any piece of paper.  please call us sooner if your blood sugar goes below 70, or if you have a lot of readings over 200.   Please increase the insulin to 55 units with breakfast.   On this type of insulin schedule, you should eat meals on a regular schedule (especially lunch).  If a meal is missed or significantly delayed, your blood sugar could go low.   Please come back for a follow-up appointment in 2-4 weeks.

## 2018-09-22 ENCOUNTER — Other Ambulatory Visit: Payer: Self-pay

## 2018-09-22 ENCOUNTER — Encounter (HOSPITAL_BASED_OUTPATIENT_CLINIC_OR_DEPARTMENT_OTHER): Payer: Medicare Other | Attending: Internal Medicine

## 2018-09-22 DIAGNOSIS — G4730 Sleep apnea, unspecified: Secondary | ICD-10-CM | POA: Diagnosis not present

## 2018-09-22 DIAGNOSIS — I509 Heart failure, unspecified: Secondary | ICD-10-CM | POA: Diagnosis not present

## 2018-09-22 DIAGNOSIS — I890 Lymphedema, not elsewhere classified: Secondary | ICD-10-CM | POA: Insufficient documentation

## 2018-09-22 DIAGNOSIS — Z794 Long term (current) use of insulin: Secondary | ICD-10-CM | POA: Insufficient documentation

## 2018-09-22 DIAGNOSIS — J449 Chronic obstructive pulmonary disease, unspecified: Secondary | ICD-10-CM | POA: Diagnosis not present

## 2018-09-22 DIAGNOSIS — Z9981 Dependence on supplemental oxygen: Secondary | ICD-10-CM | POA: Insufficient documentation

## 2018-09-22 DIAGNOSIS — L97322 Non-pressure chronic ulcer of left ankle with fat layer exposed: Secondary | ICD-10-CM | POA: Diagnosis not present

## 2018-09-22 DIAGNOSIS — L97222 Non-pressure chronic ulcer of left calf with fat layer exposed: Secondary | ICD-10-CM | POA: Diagnosis not present

## 2018-09-22 DIAGNOSIS — I110 Hypertensive heart disease with heart failure: Secondary | ICD-10-CM | POA: Insufficient documentation

## 2018-09-22 DIAGNOSIS — I87322 Chronic venous hypertension (idiopathic) with inflammation of left lower extremity: Secondary | ICD-10-CM | POA: Insufficient documentation

## 2018-09-22 DIAGNOSIS — E119 Type 2 diabetes mellitus without complications: Secondary | ICD-10-CM | POA: Insufficient documentation

## 2018-09-29 DIAGNOSIS — L97222 Non-pressure chronic ulcer of left calf with fat layer exposed: Secondary | ICD-10-CM | POA: Diagnosis not present

## 2018-09-30 ENCOUNTER — Encounter: Payer: Self-pay | Admitting: Endocrinology

## 2018-10-03 ENCOUNTER — Other Ambulatory Visit: Payer: Self-pay

## 2018-10-03 ENCOUNTER — Ambulatory Visit (INDEPENDENT_AMBULATORY_CARE_PROVIDER_SITE_OTHER): Payer: Medicare Other | Admitting: Endocrinology

## 2018-10-03 DIAGNOSIS — N182 Chronic kidney disease, stage 2 (mild): Secondary | ICD-10-CM

## 2018-10-03 DIAGNOSIS — E1122 Type 2 diabetes mellitus with diabetic chronic kidney disease: Secondary | ICD-10-CM

## 2018-10-03 DIAGNOSIS — Z794 Long term (current) use of insulin: Secondary | ICD-10-CM

## 2018-10-03 MED ORDER — INSULIN NPH ISOPHANE & REGULAR (70-30) 100 UNIT/ML ~~LOC~~ SUSP: 52.0000 [IU] | Freq: Every day | SUBCUTANEOUS | 11 refills | Status: DC

## 2018-10-03 NOTE — Progress Notes (Signed)
Subjective:    Patient ID: Ivan Parks, male    DOB: 06-01-1940, 78 y.o.   MRN: [id number]  HPI telehealth visit today via Phone x 7 minutes Alternatives to telehealth are presented to this patient, and the patient agrees to the telehealth visit.   Pt is advised of the cost of the visit, and agrees to this, also.   Patient is at home, and I am at the office.   Persons attending the telehealth visit: the patient, wife, and I Pt returns for f/u of diabetes mellitus: DM type: Insulin-requiring type 2.  Dx'ed: 2010 Complications: polyneuropathy and PAD.   Therapy: insulin since 2016.   DKA: never Severe hypoglycemia: last episode was early 2020.   Pancreatitis: never.   Other: he takes QD insulin, due to h/o noncompliance; he takes human insulin, due to cost; he has long duration of action of insulin; NPH was changed to 70/30, due to AM hypoglycemia and PM hyperglycemia; rx is limited by memory loss; dtr brings him to appts, but he declines help from family at home.    Interval history: pt says he never misses the insulin.  he says cbg varies from 71-275.  pt states he feels well in general.   Past Medical History:  Diagnosis Date  . Acute respiratory failure (HCC)   . AKI (acute kidney injury) (HCC) 04/01/2016  . Bilateral lower extremity edema   . Bronchitis   . Chest pain 12/02/2017  . CHF (congestive heart failure) (HCC)   . Chronic respiratory failure (HCC) 11/26/2014  . COPD (chronic obstructive pulmonary disease) (HCC)   . 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 Diabetes (HCC) 05/02/2016  . DM (diabetes mellitus) (HCC)   . Essential hypertension 03/06/2015   hypertension   . HTN (hypertension)   . Hyperlipidemia   . Hypotension 03/31/2016  . Hypoxemia   . Leg edema, right- Greater than Left leg 07/05/2014   Lower extremity edema    . OSA (obstructive sleep apnea)   . Peripheral vascular disease (HCC)   . Tobacco abuse     Past Surgical History:  Procedure Laterality Date  . COLON SURGERY      Social History   Socioeconomic History  . Marital status: Married    Spouse name: Not on file  . Number of children: 7  . Years of education: Not on file  . Highest education level: Not on file  Occupational History  . Occupation: retired  Engineer, production  . Financial resource strain: Not on file  . Food insecurity:    Worry: Not on file    Inability: Not on file  . Transportation needs:    Medical: Not on file    Non-medical: Not on file  Tobacco Use  . Smoking status: Former Smoker    Packs/day: 1.50    Years: 50.00    Pack years: 75.00    Types: Cigarettes    Last attempt to quit: 11/04/2010    Years since quitting: 7.9  . Smokeless tobacco: Never Used  Substance and Sexual Activity  . Alcohol use: No    Alcohol/week: 0.0 standard drinks  . Drug use: No  . Sexual activity: Not on file  Lifestyle  . Physical activity:    Days per week: Not on file    Minutes  per session: Not on file  . Stress: Not on file  Relationships  . Social connections:    Talks on phone: Not on file    Gets together: Not on file    Attends religious service: Not on file    Active member of club or organization: Not on file    Attends meetings of clubs or organizations: Not on file    Relationship status: Not on file  . Intimate partner violence:    Fear of current or ex partner: Not on file    Emotionally abused: Not on file    Physically abused: Not on file    Forced sexual activity: Not on file  Other Topics Concern  . Not on file  Social History Narrative  . Not on file    Current Outpatient Medications on File Prior to Visit  Medication Sig Dispense Refill  . acetaminophen (TYLENOL) 500 MG tablet Take 500 mg by mouth every 8 (eight) hours as needed for mild pain.     Marland Kitchen albuterol (PROVENTIL) (2.5 MG/3ML) 0.083%  nebulizer solution Take 3 mLs (2.5 mg total) by nebulization every 6 (six) hours as needed for wheezing or shortness of breath. 360 mL 11  . atorvastatin (LIPITOR) 20 MG tablet Take 20 mg by mouth daily at 6 PM.     . furosemide (LASIX) 40 MG tablet Take 1 tablet (40 mg total) by mouth daily. 90 tablet 3  . glucose blood (ONETOUCH VERIO) test strip Use to check blood sugar 2 times per day. 200 each 2  . Insulin Syringe-Needle U-100 (RELION INSULIN SYRINGE 1ML/31G) 31G X 5/16" 1 ML MISC 1 Device by Does not apply route daily. 100 each 3  . lisinopril (PRINIVIL,ZESTRIL) 20 MG tablet Take 20 mg by mouth daily.    [name] DELICA LANCETS 33G MISC Use to check blood sugar 2 times per day. 200 each 2  . pantoprazole (PROTONIX) 40 MG tablet Take 1 tablet (40 mg total) by mouth daily. 90 tablet 3  . Tiotropium Bromide-Olodaterol (STIOLTO RESPIMAT) 2.5-2.5 MCG/ACT AERS Inhale 2 puffs into the lungs daily. 3 Inhaler 1  . Tiotropium Bromide-Olodaterol (STIOLTO RESPIMAT) 2.5-2.5 MCG/ACT AERS Inhale 2 puffs into the lungs daily. 2 Inhaler 0   No current facility-administered medications on file prior to visit.     No Known Allergies  Family History  Problem Relation Age of Onset  . Diabetes Mother   . Hypertension Mother   . Peripheral vascular disease Mother        amputation  . Diabetes Father        Right Leg Amputation-Gangrene  . Hypertension Father   . Peripheral vascular disease Father        amputation  . Cancer Father        Lead Poison-Ca  . Pneumonia Father   . Diabetes Sister   . Hypertension Sister   . Diabetes Daughter   . Hypertension Daughter       Review of Systems He denies hypoglycemia    Objective:   Physical Exam      Assessment & Plan:  Insulin-requiring type 2 DM, with PAD: overcontrolled, given this insulin regimen, which does match insulin to her changing needs throughout the day  Patient Instructions  check your blood sugar twice a day.  vary the time  of day when you check, between before the 3 meals, and at bedtime.  also check if you have symptoms of your blood sugar being too high or too  low.  please keep a record of the readings and bring it to your next appointment here (or you can bring the meter itself).  You can write it on any piece of paper.  please call us sooner if your blood sugar goes below 70, or if you have a lot of readings over 200.   Please decrease the insulin to 52 units with breakfast.   On this type of insulin schedule, you should eat meals on a regular schedule (especially lunch).  If a meal is missed or significantly delayed, your blood sugar could go low.   Please come back for a follow-up appointment in 1 month.

## 2018-10-03 NOTE — Patient Instructions (Addendum)
check your blood sugar twice a day.  vary the time of day when you check, between before the 3 meals, and at bedtime.  also check if you have symptoms of your blood sugar being too high or too low.  please keep a record of the readings and bring it to your next appointment here (or you can bring the meter itself).  You can write it on any piece of paper.  please call us sooner if your blood sugar goes below 70, or if you have a lot of readings over 200.   Please decrease the insulin to 52 units with breakfast.   On this type of insulin schedule, you should eat meals on a regular schedule (especially lunch).  If a meal is missed or significantly delayed, your blood sugar could go low.   Please come back for a follow-up appointment in 1 month.

## 2018-10-06 DIAGNOSIS — L97222 Non-pressure chronic ulcer of left calf with fat layer exposed: Secondary | ICD-10-CM | POA: Diagnosis not present

## 2018-10-13 DIAGNOSIS — L97222 Non-pressure chronic ulcer of left calf with fat layer exposed: Secondary | ICD-10-CM | POA: Diagnosis not present

## 2018-10-20 ENCOUNTER — Encounter (HOSPITAL_BASED_OUTPATIENT_CLINIC_OR_DEPARTMENT_OTHER): Payer: Medicare Other | Attending: Internal Medicine

## 2018-10-20 DIAGNOSIS — J449 Chronic obstructive pulmonary disease, unspecified: Secondary | ICD-10-CM | POA: Insufficient documentation

## 2018-10-20 DIAGNOSIS — L97521 Non-pressure chronic ulcer of other part of left foot limited to breakdown of skin: Secondary | ICD-10-CM | POA: Insufficient documentation

## 2018-10-20 DIAGNOSIS — L97821 Non-pressure chronic ulcer of other part of left lower leg limited to breakdown of skin: Secondary | ICD-10-CM | POA: Insufficient documentation

## 2018-10-20 DIAGNOSIS — G4730 Sleep apnea, unspecified: Secondary | ICD-10-CM | POA: Insufficient documentation

## 2018-10-20 DIAGNOSIS — I890 Lymphedema, not elsewhere classified: Secondary | ICD-10-CM | POA: Insufficient documentation

## 2018-10-20 DIAGNOSIS — E1151 Type 2 diabetes mellitus with diabetic peripheral angiopathy without gangrene: Secondary | ICD-10-CM | POA: Insufficient documentation

## 2018-10-20 DIAGNOSIS — I87332 Chronic venous hypertension (idiopathic) with ulcer and inflammation of left lower extremity: Secondary | ICD-10-CM | POA: Insufficient documentation

## 2018-10-20 DIAGNOSIS — I110 Hypertensive heart disease with heart failure: Secondary | ICD-10-CM | POA: Insufficient documentation

## 2018-10-20 DIAGNOSIS — I509 Heart failure, unspecified: Secondary | ICD-10-CM | POA: Insufficient documentation

## 2018-10-27 ENCOUNTER — Other Ambulatory Visit: Payer: Self-pay

## 2018-10-27 DIAGNOSIS — G4730 Sleep apnea, unspecified: Secondary | ICD-10-CM | POA: Diagnosis not present

## 2018-10-27 DIAGNOSIS — I87332 Chronic venous hypertension (idiopathic) with ulcer and inflammation of left lower extremity: Secondary | ICD-10-CM | POA: Diagnosis present

## 2018-10-27 DIAGNOSIS — L97521 Non-pressure chronic ulcer of other part of left foot limited to breakdown of skin: Secondary | ICD-10-CM | POA: Diagnosis not present

## 2018-10-27 DIAGNOSIS — I890 Lymphedema, not elsewhere classified: Secondary | ICD-10-CM | POA: Diagnosis not present

## 2018-10-27 DIAGNOSIS — I509 Heart failure, unspecified: Secondary | ICD-10-CM | POA: Diagnosis not present

## 2018-10-27 DIAGNOSIS — E1151 Type 2 diabetes mellitus with diabetic peripheral angiopathy without gangrene: Secondary | ICD-10-CM | POA: Diagnosis not present

## 2018-10-27 DIAGNOSIS — I110 Hypertensive heart disease with heart failure: Secondary | ICD-10-CM | POA: Diagnosis not present

## 2018-10-27 DIAGNOSIS — L97821 Non-pressure chronic ulcer of other part of left lower leg limited to breakdown of skin: Secondary | ICD-10-CM | POA: Diagnosis not present

## 2018-10-27 DIAGNOSIS — J449 Chronic obstructive pulmonary disease, unspecified: Secondary | ICD-10-CM | POA: Diagnosis not present

## 2018-10-28 ENCOUNTER — Other Ambulatory Visit: Payer: Self-pay

## 2018-10-28 ENCOUNTER — Encounter: Payer: Self-pay | Admitting: Endocrinology

## 2018-10-28 ENCOUNTER — Ambulatory Visit (INDEPENDENT_AMBULATORY_CARE_PROVIDER_SITE_OTHER): Payer: Medicare Other | Admitting: Endocrinology

## 2018-10-28 VITALS — BP 138/82 | HR 107 | Temp 98.0°F | Wt 191.0 lb

## 2018-10-28 DIAGNOSIS — Z794 Long term (current) use of insulin: Secondary | ICD-10-CM | POA: Diagnosis not present

## 2018-10-28 DIAGNOSIS — N182 Chronic kidney disease, stage 2 (mild): Secondary | ICD-10-CM | POA: Diagnosis not present

## 2018-10-28 DIAGNOSIS — E1122 Type 2 diabetes mellitus with diabetic chronic kidney disease: Secondary | ICD-10-CM | POA: Diagnosis not present

## 2018-10-28 LAB — POCT GLYCOSYLATED HEMOGLOBIN (HGB A1C): Hemoglobin A1C: 8.3 % — AB (ref 4.0–5.6)

## 2018-10-28 MED ORDER — INSULIN NPH (HUMAN) (ISOPHANE) 100 UNIT/ML ~~LOC~~ SUSP: 26.0000 [IU] | Freq: Every day | SUBCUTANEOUS | 11 refills | Status: DC

## 2018-10-28 MED ORDER — INSULIN REGULAR HUMAN 100 UNIT/ML IJ SOLN: 26.0000 [IU] | Freq: Every day | INTRAMUSCULAR | 11 refills | Status: DC

## 2018-10-28 NOTE — Progress Notes (Signed)
Subjective:    Patient ID: Ivan MinisterLarry L Parks, male    DOB: [birth year], 78 y.o.   MRN: [id number]  HPI Pt returns for f/u of diabetes mellitus: DM type: Insulin-requiring type 2.  Dx'ed: 2010 Complications: polyneuropathy and PAD.   Therapy: insulin since 2016.   DKA: never Severe hypoglycemia: last episode was early 2020.   Pancreatitis: never.   Other: he takes QD insulin, due to h/o noncompliance; he takes human insulin, due to cost; he has long duration of action of insulin; NPH was changed to 70/30, due to AM hypoglycemia and PM hyperglycemia; rx is limited by memory loss; dtr brings him to appts, but he declines help from family at home.  Interval history: pt says he rarely misses the insulin.  he says cbg varies from 32-384.  It is in general higher as the day goes on.  pt states he feels well in general.   Past Medical History:  Diagnosis Date  . Acute respiratory failure (HCC)   . AKI (acute kidney injury) (HCC) 04/01/2016  . Bilateral lower extremity edema   . Bronchitis   . Chest pain 12/02/2017  . CHF (congestive heart failure) (HCC)   . Chronic respiratory failure (HCC) 11/26/2014  . COPD (chronic obstructive pulmonary disease) (HCC)   . 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 Diabetes (HCC) 05/02/2016  . DM (diabetes mellitus) (HCC)   . Essential hypertension 03/06/2015   hypertension   . HTN (hypertension)   . Hyperlipidemia   . Hypotension 03/31/2016  . Hypoxemia   . Leg edema, right- Greater than Left leg 07/05/2014   Lower extremity edema   . OSA (obstructive sleep apnea)   . Peripheral vascular disease (HCC)   . Tobacco abuse     Past Surgical History:  Procedure Laterality Date  . COLON SURGERY      Social History   Socioeconomic History  . Marital status: Married    Spouse name: Not on file  . Number of  children: 7  . Years of education: Not on file  . Highest education level: Not on file  Occupational History  . Occupation: retired  Engineer, productionocial Needs  . Financial resource strain: Not on file  . Food insecurity    Worry: Not on file    Inability: Not on file  . Transportation needs    Medical: Not on file    Non-medical: Not on file  Tobacco Use  . Smoking status: Former Smoker    Packs/day: 1.50    Years: 50.00    Pack years: 75.00    Types: Cigarettes    Quit date: 11/04/2010    Years since quitting: 7.9  . Smokeless tobacco: Never Used  Substance and Sexual Activity  . Alcohol use: No    Alcohol/week: 0.0 standard drinks  . Drug use: No  . Sexual activity: Not on file  Lifestyle  . Physical activity    Days per week: Not on file    Minutes per session: Not on file  . Stress: Not on file  Relationships  . Social Musicianconnections    Talks on phone: Not on file    Gets together: Not on file    Attends religious service: Not on file    Active member of club or organization: Not on file  Attends meetings of clubs or organizations: Not on file    Relationship status: Not on file  . Intimate partner violence    Fear of current or ex partner: Not on file    Emotionally abused: Not on file    Physically abused: Not on file    Forced sexual activity: Not on file  Other Topics Concern  . Not on file  Social History Narrative  . Not on file    Current Outpatient Medications on File Prior to Visit  Medication Sig Dispense Refill  . acetaminophen (TYLENOL) 500 MG tablet Take 500 mg by mouth every 8 (eight) hours as needed for mild pain.     Marland Kitchen. albuterol (PROVENTIL) (2.5 MG/3ML) 0.083% nebulizer solution Take 3 mLs (2.5 mg total) by nebulization every 6 (six) hours as needed for wheezing or shortness of breath. 360 mL 11  . atorvastatin (LIPITOR) 20 MG tablet Take 20 mg by mouth daily at 6 PM.     . furosemide (LASIX) 40 MG tablet Take 1 tablet (40 mg total) by mouth daily. 90  tablet 3  . glucose blood (ONETOUCH VERIO) test strip Use to check blood sugar 2 times per day. 200 each 2  . Insulin Syringe-Needle U-100 (RELION INSULIN SYRINGE 1ML/31G) 31G X 5/16" 1 ML MISC 1 Device by Does not apply route daily. 100 each 3  . lisinopril (PRINIVIL,ZESTRIL) 20 MG tablet Take 20 mg by mouth daily.    [name]. ONETOUCH DELICA LANCETS 33G MISC Use to check blood sugar 2 times per day. 200 each 2  . pantoprazole (PROTONIX) 40 MG tablet Take 1 tablet (40 mg total) by mouth daily. 90 tablet 3  . Tiotropium Bromide-Olodaterol (STIOLTO RESPIMAT) 2.5-2.5 MCG/ACT AERS Inhale 2 puffs into the lungs daily. 3 Inhaler 1  . Tiotropium Bromide-Olodaterol (STIOLTO RESPIMAT) 2.5-2.5 MCG/ACT AERS Inhale 2 puffs into the lungs daily. 2 Inhaler 0   No current facility-administered medications on file prior to visit.     No Known Allergies  Family History  Problem Relation Age of Onset  . Diabetes Mother   . Hypertension Mother   . Peripheral vascular disease Mother        amputation  . Diabetes Father        Right Leg Amputation-Gangrene  . Hypertension Father   . Peripheral vascular disease Father        amputation  . Cancer Father        Lead Poison-Ca  . Pneumonia Father   . Diabetes Sister   . Hypertension Sister   . Diabetes Daughter   . Hypertension Daughter     BP 138/82 (BP Location: Right Arm, Patient Position: Sitting, Cuff Size: Normal)   Pulse (!) 107   Temp 98 F (36.7 C) (Oral)   Wt 191 lb (86.6 kg)   SpO2 90% Comment: 2 liters pulse  BMI 32.79 kg/m    Review of Systems Denies LOC    Objective:   Physical Exam VITAL SIGNS:  See vs page GENERAL: no distress.  In wheelchair.   Ext: both feet are bandaged.   Lab Results  Component Value Date   CREATININE 0.97 12/02/2017   BUN 21 12/02/2017   NA 142 12/02/2017   K 4.8 12/02/2017   CL 97 12/02/2017   CO2 29 12/02/2017     A1c=8.3%    Assessment & Plan:  Insulin-requiring type 2 DM, with PAD: The  pattern of his cbg's indicates he needs a faster-acting qd insulin Hypoglycemia: worse  Patient Instructions  check your blood sugar twice a day.  vary the time of day when you check, between before the 3 meals, and at bedtime.  also check if you have symptoms of your blood sugar being too high or too low.  please keep a record of the readings and bring it to your next appointment here (or you can bring the meter itself).  You can write it on any piece of paper.  please call us sooner if your blood sugar goes below 70, or if you have a lot of readings over 200.   Please change the insulin to 26 units of each, both with breakfast.   On this type of insulin schedule, you should eat meals on a regular schedule (especially lunch).  If a meal is missed or significantly delayed, your blood sugar could go low.   Please come back for a follow-up appointment in 1 month.

## 2018-10-28 NOTE — Patient Instructions (Addendum)
check your blood sugar twice a day.  vary the time of day when you check, between before the 3 meals, and at bedtime.  also check if you have symptoms of your blood sugar being too high or too low.  please keep a record of the readings and bring it to your next appointment here (or you can bring the meter itself).  You can write it on any piece of paper.  please call us sooner if your blood sugar goes below 70, or if you have a lot of readings over 200.   Please change the insulin to 26 units of each, both with breakfast.   On this type of insulin schedule, you should eat meals on a regular schedule (especially lunch).  If a meal is missed or significantly delayed, your blood sugar could go low.   Please come back for a follow-up appointment in 1 month.

## 2018-11-03 DIAGNOSIS — I87332 Chronic venous hypertension (idiopathic) with ulcer and inflammation of left lower extremity: Secondary | ICD-10-CM | POA: Diagnosis not present

## 2018-11-14 ENCOUNTER — Encounter (HOSPITAL_COMMUNITY): Payer: Self-pay

## 2018-11-14 ENCOUNTER — Other Ambulatory Visit: Payer: Self-pay

## 2018-11-14 ENCOUNTER — Emergency Department (HOSPITAL_COMMUNITY): Payer: Medicare Other

## 2018-11-14 ENCOUNTER — Inpatient Hospital Stay (HOSPITAL_COMMUNITY)
Admission: EM | Admit: 2018-11-14 | Discharge: 2018-11-22 | DRG: 638 | Disposition: A | Discharge status: Skilled Nursing Facility | Payer: Medicare Other | Attending: Internal Medicine | Admitting: Internal Medicine

## 2018-11-14 DIAGNOSIS — E11621 Type 2 diabetes mellitus with foot ulcer: Principal | ICD-10-CM

## 2018-11-14 DIAGNOSIS — L97529 Non-pressure chronic ulcer of other part of left foot with unspecified severity: Secondary | ICD-10-CM | POA: Diagnosis present

## 2018-11-14 DIAGNOSIS — L089 Local infection of the skin and subcutaneous tissue, unspecified: Secondary | ICD-10-CM

## 2018-11-14 DIAGNOSIS — B999 Unspecified infectious disease: Secondary | ICD-10-CM | POA: Diagnosis present

## 2018-11-14 DIAGNOSIS — Z833 Family history of diabetes mellitus: Secondary | ICD-10-CM

## 2018-11-14 DIAGNOSIS — E785 Hyperlipidemia, unspecified: Secondary | ICD-10-CM | POA: Diagnosis present

## 2018-11-14 DIAGNOSIS — I5032 Chronic diastolic (congestive) heart failure: Secondary | ICD-10-CM | POA: Diagnosis present

## 2018-11-14 DIAGNOSIS — J449 Chronic obstructive pulmonary disease, unspecified: Secondary | ICD-10-CM | POA: Diagnosis present

## 2018-11-14 DIAGNOSIS — I110 Hypertensive heart disease with heart failure: Secondary | ICD-10-CM | POA: Diagnosis present

## 2018-11-14 DIAGNOSIS — E119 Type 2 diabetes mellitus without complications: Secondary | ICD-10-CM

## 2018-11-14 DIAGNOSIS — J9611 Chronic respiratory failure with hypoxia: Secondary | ICD-10-CM | POA: Diagnosis present

## 2018-11-14 DIAGNOSIS — L03115 Cellulitis of right lower limb: Secondary | ICD-10-CM | POA: Diagnosis present

## 2018-11-14 DIAGNOSIS — Z794 Long term (current) use of insulin: Secondary | ICD-10-CM

## 2018-11-14 DIAGNOSIS — I878 Other specified disorders of veins: Secondary | ICD-10-CM | POA: Diagnosis present

## 2018-11-14 DIAGNOSIS — E1165 Type 2 diabetes mellitus with hyperglycemia: Secondary | ICD-10-CM | POA: Diagnosis present

## 2018-11-14 DIAGNOSIS — L97519 Non-pressure chronic ulcer of other part of right foot with unspecified severity: Secondary | ICD-10-CM | POA: Diagnosis present

## 2018-11-14 DIAGNOSIS — I739 Peripheral vascular disease, unspecified: Secondary | ICD-10-CM | POA: Diagnosis present

## 2018-11-14 DIAGNOSIS — N179 Acute kidney failure, unspecified: Secondary | ICD-10-CM | POA: Diagnosis present

## 2018-11-14 DIAGNOSIS — Z9119 Patient's noncompliance with other medical treatment and regimen: Secondary | ICD-10-CM

## 2018-11-14 DIAGNOSIS — E1151 Type 2 diabetes mellitus with diabetic peripheral angiopathy without gangrene: Secondary | ICD-10-CM | POA: Diagnosis present

## 2018-11-14 DIAGNOSIS — L03116 Cellulitis of left lower limb: Secondary | ICD-10-CM | POA: Diagnosis present

## 2018-11-14 DIAGNOSIS — Z87891 Personal history of nicotine dependence: Secondary | ICD-10-CM

## 2018-11-14 DIAGNOSIS — L97509 Non-pressure chronic ulcer of other part of unspecified foot with unspecified severity: Secondary | ICD-10-CM

## 2018-11-14 DIAGNOSIS — Z1159 Encounter for screening for other viral diseases: Secondary | ICD-10-CM

## 2018-11-14 DIAGNOSIS — E11628 Type 2 diabetes mellitus with other skin complications: Secondary | ICD-10-CM

## 2018-11-14 DIAGNOSIS — Z9114 Patient's other noncompliance with medication regimen: Secondary | ICD-10-CM

## 2018-11-14 DIAGNOSIS — I509 Heart failure, unspecified: Secondary | ICD-10-CM

## 2018-11-14 DIAGNOSIS — I890 Lymphedema, not elsewhere classified: Secondary | ICD-10-CM | POA: Diagnosis present

## 2018-11-14 LAB — CBG MONITORING, ED: Glucose-Capillary: 219 mg/dL — ABNORMAL HIGH (ref 70–99)

## 2018-11-14 LAB — LACTIC ACID, PLASMA: Lactic Acid, Venous: 1.2 mmol/L (ref 0.5–1.9)

## 2018-11-14 LAB — COMPREHENSIVE METABOLIC PANEL
ALT: 14 U/L (ref 0–44)
AST: 28 U/L (ref 15–41)
Albumin: 2.8 g/dL — ABNORMAL LOW (ref 3.5–5.0)
Alkaline Phosphatase: 65 U/L (ref 38–126)
Anion gap: 10 (ref 5–15)
BUN: 21 mg/dL (ref 8–23)
CO2: 29 mmol/L (ref 22–32)
Calcium: 8.7 mg/dL — ABNORMAL LOW (ref 8.9–10.3)
Chloride: 91 mmol/L — ABNORMAL LOW (ref 98–111)
Creatinine, Ser: 1.21 mg/dL (ref 0.61–1.24)
GFR calc Af Amer: >60 mL/min (ref >60)
GFR calc non Af Amer: 57 mL/min — ABNORMAL LOW (ref >60)
Glucose, Bld: 207 mg/dL — ABNORMAL HIGH (ref 70–99)
Potassium: 4.8 mmol/L (ref 3.5–5.1)
Sodium: 130 mmol/L — ABNORMAL LOW (ref 135–145)
Total Bilirubin: 0.5 mg/dL (ref 0.3–1.2)
Total Protein: 7.3 g/dL (ref 6.5–8.1)

## 2018-11-14 LAB — CBC WITH DIFFERENTIAL/PLATELET
Abs Immature Granulocytes: 0.02 10*3/uL (ref 0.00–0.07)
Basophils Absolute: 0 10*3/uL (ref 0.0–0.1)
Basophils Relative: 0 %
Eosinophils Absolute: 0.1 10*3/uL (ref 0.0–0.5)
Eosinophils Relative: 1 %
HCT: 49.6 % (ref 39.0–52.0)
Hemoglobin: 14.7 g/dL (ref 13.0–17.0)
Immature Granulocytes: 0 %
Lymphocytes Relative: 14 %
Lymphs Abs: 1 10*3/uL (ref 0.7–4.0)
MCH: 26.9 pg (ref 26.0–34.0)
MCHC: 29.6 g/dL — ABNORMAL LOW (ref 30.0–36.0)
MCV: 90.8 fL (ref 80.0–100.0)
Monocytes Absolute: 0.9 10*3/uL (ref 0.1–1.0)
Monocytes Relative: 13 %
Neutro Abs: 5.3 10*3/uL (ref 1.7–7.7)
Neutrophils Relative %: 72 %
Platelets: 168 10*3/uL (ref 150–400)
RBC: 5.46 MIL/uL (ref 4.22–5.81)
RDW: 13.1 % (ref 11.5–15.5)
WBC: 7.3 10*3/uL (ref 4.0–10.5)
nRBC: 0 % (ref 0.0–0.2)

## 2018-11-14 LAB — PROTIME-INR
INR: 1.1 (ref 0.8–1.2)
Prothrombin Time: 13.7 seconds (ref 11.4–15.2)

## 2018-11-14 MED ORDER — SODIUM CHLORIDE 0.9% FLUSH: 3.0000 mL | Freq: Once | INTRAVENOUS | Status: DC

## 2018-11-14 NOTE — ED Triage Notes (Signed)
Pt reports increased redness and swelling to the first three toes on his right foot. Pt recently had toenail removed on right foot. Hs of diabetes and states his blood sugars have been running high. Pt takes insulin

## 2018-11-15 ENCOUNTER — Inpatient Hospital Stay (HOSPITAL_COMMUNITY): Payer: Medicare Other

## 2018-11-15 ENCOUNTER — Encounter (HOSPITAL_COMMUNITY): Payer: Self-pay | Admitting: General Practice

## 2018-11-15 DIAGNOSIS — I110 Hypertensive heart disease with heart failure: Secondary | ICD-10-CM | POA: Diagnosis present

## 2018-11-15 DIAGNOSIS — B999 Unspecified infectious disease: Secondary | ICD-10-CM | POA: Diagnosis present

## 2018-11-15 DIAGNOSIS — I878 Other specified disorders of veins: Secondary | ICD-10-CM | POA: Diagnosis present

## 2018-11-15 DIAGNOSIS — E11628 Type 2 diabetes mellitus with other skin complications: Secondary | ICD-10-CM | POA: Diagnosis not present

## 2018-11-15 DIAGNOSIS — Z794 Long term (current) use of insulin: Secondary | ICD-10-CM

## 2018-11-15 DIAGNOSIS — N182 Chronic kidney disease, stage 2 (mild): Secondary | ICD-10-CM | POA: Diagnosis not present

## 2018-11-15 DIAGNOSIS — I5032 Chronic diastolic (congestive) heart failure: Secondary | ICD-10-CM | POA: Diagnosis present

## 2018-11-15 DIAGNOSIS — E1122 Type 2 diabetes mellitus with diabetic chronic kidney disease: Secondary | ICD-10-CM | POA: Diagnosis not present

## 2018-11-15 DIAGNOSIS — L03116 Cellulitis of left lower limb: Secondary | ICD-10-CM | POA: Diagnosis present

## 2018-11-15 DIAGNOSIS — I890 Lymphedema, not elsewhere classified: Secondary | ICD-10-CM | POA: Diagnosis present

## 2018-11-15 DIAGNOSIS — J9611 Chronic respiratory failure with hypoxia: Secondary | ICD-10-CM | POA: Diagnosis present

## 2018-11-15 DIAGNOSIS — L97529 Non-pressure chronic ulcer of other part of left foot with unspecified severity: Secondary | ICD-10-CM | POA: Diagnosis present

## 2018-11-15 DIAGNOSIS — E785 Hyperlipidemia, unspecified: Secondary | ICD-10-CM | POA: Diagnosis present

## 2018-11-15 DIAGNOSIS — Z87891 Personal history of nicotine dependence: Secondary | ICD-10-CM | POA: Diagnosis not present

## 2018-11-15 DIAGNOSIS — E1165 Type 2 diabetes mellitus with hyperglycemia: Secondary | ICD-10-CM | POA: Diagnosis present

## 2018-11-15 DIAGNOSIS — L97509 Non-pressure chronic ulcer of other part of unspecified foot with unspecified severity: Secondary | ICD-10-CM

## 2018-11-15 DIAGNOSIS — E11621 Type 2 diabetes mellitus with foot ulcer: Secondary | ICD-10-CM

## 2018-11-15 DIAGNOSIS — Z9119 Patient's noncompliance with other medical treatment and regimen: Secondary | ICD-10-CM | POA: Diagnosis not present

## 2018-11-15 DIAGNOSIS — Z1159 Encounter for screening for other viral diseases: Secondary | ICD-10-CM | POA: Diagnosis not present

## 2018-11-15 DIAGNOSIS — Z833 Family history of diabetes mellitus: Secondary | ICD-10-CM | POA: Diagnosis not present

## 2018-11-15 DIAGNOSIS — L089 Local infection of the skin and subcutaneous tissue, unspecified: Secondary | ICD-10-CM

## 2018-11-15 DIAGNOSIS — L97519 Non-pressure chronic ulcer of other part of right foot with unspecified severity: Secondary | ICD-10-CM | POA: Diagnosis present

## 2018-11-15 DIAGNOSIS — I10 Essential (primary) hypertension: Secondary | ICD-10-CM | POA: Diagnosis not present

## 2018-11-15 DIAGNOSIS — N179 Acute kidney failure, unspecified: Secondary | ICD-10-CM | POA: Diagnosis present

## 2018-11-15 DIAGNOSIS — E7800 Pure hypercholesterolemia, unspecified: Secondary | ICD-10-CM

## 2018-11-15 DIAGNOSIS — E1151 Type 2 diabetes mellitus with diabetic peripheral angiopathy without gangrene: Secondary | ICD-10-CM | POA: Diagnosis present

## 2018-11-15 DIAGNOSIS — I739 Peripheral vascular disease, unspecified: Secondary | ICD-10-CM | POA: Diagnosis not present

## 2018-11-15 DIAGNOSIS — Z9114 Patient's other noncompliance with medication regimen: Secondary | ICD-10-CM | POA: Diagnosis not present

## 2018-11-15 DIAGNOSIS — L03115 Cellulitis of right lower limb: Secondary | ICD-10-CM | POA: Diagnosis present

## 2018-11-15 DIAGNOSIS — J449 Chronic obstructive pulmonary disease, unspecified: Secondary | ICD-10-CM | POA: Diagnosis present

## 2018-11-15 DIAGNOSIS — L97505 Non-pressure chronic ulcer of other part of unspecified foot with muscle involvement without evidence of necrosis: Secondary | ICD-10-CM | POA: Diagnosis not present

## 2018-11-15 LAB — GLUCOSE, CAPILLARY
Glucose-Capillary: 171 mg/dL — ABNORMAL HIGH (ref 70–99)
Glucose-Capillary: 234 mg/dL — ABNORMAL HIGH (ref 70–99)
Glucose-Capillary: 216 mg/dL — ABNORMAL HIGH (ref 70–99)
Glucose-Capillary: 83 mg/dL (ref 70–99)

## 2018-11-15 LAB — SARS CORONAVIRUS 2 BY RT PCR (HOSPITAL ORDER, PERFORMED IN ~~LOC~~ HOSPITAL LAB): SARS Coronavirus 2: NEGATIVE

## 2018-11-15 LAB — LACTIC ACID, PLASMA: Lactic Acid, Venous: 1.1 mmol/L (ref 0.5–1.9)

## 2018-11-15 MED ORDER — INSULIN DETEMIR 100 UNIT/ML ~~LOC~~ SOLN
15.0000 [IU] | Freq: Every day | SUBCUTANEOUS | Status: DC
  Administered 2018-11-16 – 2018-11-19 (×4): 15 [IU] via SUBCUTANEOUS
  Filled 2018-11-15 (×5): qty 0.15

## 2018-11-15 MED ORDER — ENOXAPARIN SODIUM 40 MG/0.4ML ~~LOC~~ SOLN
40.0000 mg | SUBCUTANEOUS | Status: DC
  Administered 2018-11-15 – 2018-11-20 (×4): 40 mg via SUBCUTANEOUS
  Filled 2018-11-15 (×6): qty 0.4

## 2018-11-15 MED ORDER — INSULIN ASPART 100 UNIT/ML ~~LOC~~ SOLN
0.0000 [IU] | Freq: Every day | SUBCUTANEOUS | Status: DC
  Administered 2018-11-16: 2 [IU] via SUBCUTANEOUS

## 2018-11-15 MED ORDER — ACETAMINOPHEN 650 MG RE SUPP: 650.0000 mg | Freq: Four times a day (QID) | RECTAL | Status: DC | PRN

## 2018-11-15 MED ORDER — ONDANSETRON HCL 4 MG/2ML IJ SOLN
4.0000 mg | Freq: Four times a day (QID) | INTRAMUSCULAR | Status: DC | PRN
  Administered 2018-11-17: 4 mg via INTRAVENOUS
  Filled 2018-11-15: qty 2

## 2018-11-15 MED ORDER — INSULIN ASPART 100 UNIT/ML ~~LOC~~ SOLN
0.0000 [IU] | Freq: Three times a day (TID) | SUBCUTANEOUS | Status: DC
  Administered 2018-11-15 (×2): 5 [IU] via SUBCUTANEOUS
  Administered 2018-11-16: 3 [IU] via SUBCUTANEOUS
  Administered 2018-11-16: 2 [IU] via SUBCUTANEOUS
  Administered 2018-11-17 – 2018-11-18 (×3): 3 [IU] via SUBCUTANEOUS
  Administered 2018-11-18 – 2018-11-20 (×2): 2 [IU] via SUBCUTANEOUS
  Administered 2018-11-20 – 2018-11-21 (×4): 3 [IU] via SUBCUTANEOUS
  Administered 2018-11-22: 2 [IU] via SUBCUTANEOUS

## 2018-11-15 MED ORDER — VANCOMYCIN HCL 10 G IV SOLR
1750.0000 mg | Freq: Once | INTRAVENOUS | Status: AC
  Administered 2018-11-15: 1750 mg via INTRAVENOUS
  Filled 2018-11-15: qty 1750

## 2018-11-15 MED ORDER — SODIUM CHLORIDE 0.9 % IV SOLN
2.0000 g | INTRAVENOUS | Status: DC
  Administered 2018-11-15 – 2018-11-18 (×4): 2 g via INTRAVENOUS
  Filled 2018-11-15 (×4): qty 20

## 2018-11-15 MED ORDER — VANCOMYCIN HCL 10 G IV SOLR
1250.0000 mg | INTRAVENOUS | Status: DC
  Administered 2018-11-16 – 2018-11-19 (×4): 1250 mg via INTRAVENOUS
  Filled 2018-11-15 (×5): qty 1250

## 2018-11-15 MED ORDER — PIPERACILLIN-TAZOBACTAM 3.375 G IVPB 30 MIN
3.3750 g | Freq: Once | INTRAVENOUS | Status: AC
  Administered 2018-11-15: 3.375 g via INTRAVENOUS
  Filled 2018-11-15: qty 50

## 2018-11-15 MED ORDER — SODIUM CHLORIDE 0.9 % IV SOLN: 1.0000 g | INTRAVENOUS | Status: DC

## 2018-11-15 MED ORDER — ONDANSETRON HCL 4 MG PO TABS: 4.0000 mg | ORAL_TABLET | Freq: Four times a day (QID) | ORAL | Status: DC | PRN

## 2018-11-15 MED ORDER — GADOBUTROL 1 MMOL/ML IV SOLN
8.0000 mL | Freq: Once | INTRAVENOUS | Status: AC | PRN
  Administered 2018-11-15: 20:00:00 8 mL via INTRAVENOUS

## 2018-11-15 MED ORDER — ACETAMINOPHEN 325 MG PO TABS
650.0000 mg | ORAL_TABLET | Freq: Four times a day (QID) | ORAL | Status: DC | PRN
  Administered 2018-11-15 – 2018-11-21 (×4): 650 mg via ORAL
  Filled 2018-11-15 (×5): qty 2

## 2018-11-15 MED ORDER — METRONIDAZOLE IN NACL 5-0.79 MG/ML-% IV SOLN
500.0000 mg | Freq: Three times a day (TID) | INTRAVENOUS | Status: DC
  Administered 2018-11-15 – 2018-11-17 (×6): 500 mg via INTRAVENOUS
  Filled 2018-11-15 (×8): qty 100

## 2018-11-15 NOTE — ED Notes (Signed)
Pt's feet cleaned and maggots removed.

## 2018-11-15 NOTE — ED Notes (Signed)
ED TO INPATIENT HANDOFF REPORT  ED Nurse Name and Phone #:  Patty [phone]  S Name/Age/Gender Ivan Parks 78 y.o. male Room/Bed: 018C/018C  Code Status   Code Status: Prior  Home/SNF/Other Home Patient oriented to: self, place, time and situation Is this baseline? Yes   Triage Complete: Triage complete  Chief Complaint infection on right foot (diabetes)  Triage Note  Pt reports increased redness and swelling to the first three toes on his right foot. Pt recently had toenail removed on right foot. Hs of diabetes and states his blood sugars have been running high. Pt takes insulin   Allergies No Known Allergies  Level of Care/Admitting Diagnosis ED Disposition    None      B Medical/Surgery History Past Medical History:  Diagnosis Date  . Acute respiratory failure (HCC)   . AKI (acute kidney injury) (HCC) 04/01/2016  . Bilateral lower extremity edema   . Bronchitis   . Chest pain 12/02/2017  . CHF (congestive heart failure) (HCC)   . Chronic respiratory failure (HCC) 11/26/2014  . COPD (chronic obstructive pulmonary disease) (HCC)   . 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 Diabetes (HCC) 05/02/2016  . DM (diabetes mellitus) (HCC)   . Essential hypertension 03/06/2015   hypertension   . HTN (hypertension)   . Hyperlipidemia   . Hypotension 03/31/2016  . Hypoxemia   . Leg edema, right- Greater than Left leg 07/05/2014   Lower extremity edema   . OSA (obstructive sleep apnea)   . Peripheral vascular disease (HCC)   . Tobacco abuse    Past Surgical History:  Procedure Laterality Date  . COLON SURGERY       A IV Location/Drains/Wounds Patient Lines/Drains/Airways Status   Active Line/Drains/Airways    Name:   Placement date:   Placement time:   Site:   Days:   Peripheral IV 11/15/18 Left Hand   11/15/18    0220     Hand   less than 1          Intake/Output Last 24 hours No intake or output data in the 24 hours ending 11/15/18 0250  Labs/Imaging Results for orders placed or performed during the hospital encounter of 11/14/18 (from the past 48 hour(s))  Comprehensive metabolic panel     Status: Abnormal   Collection Time: 11/14/18  9:06 PM  Result Value Ref Range   Sodium 130 (L) 135 - 145 mmol/L   Potassium 4.8 3.5 - 5.1 mmol/L   Chloride 91 (L) 98 - 111 mmol/L   CO2 29 22 - 32 mmol/L   Glucose, Bld 207 (H) 70 - 99 mg/dL   BUN 21 8 - 23 mg/dL   Creatinine, Ser 6.211.21 0.61 - 1.24 mg/dL   Calcium 8.7 (L) 8.9 - 10.3 mg/dL   Total Protein 7.3 6.5 - 8.1 g/dL   Albumin 2.8 (L) 3.5 - 5.0 g/dL   AST 28 15 - 41 U/L   ALT 14 0 - 44 U/L   Alkaline Phosphatase 65 38 - 126 U/L   Total Bilirubin 0.5 0.3 - 1.2 mg/dL   GFR calc non Af Amer 57 (L) >60 mL/min   GFR calc Af Amer >60 >60 mL/min   Anion gap 10 5 - 15    Comment: Performed at [facility],  [address] [place], [place] [address]  Lactic acid, plasma     Status: None   Collection Time: 11/14/18  9:06 PM  Result Value Ref Range   Lactic Acid, Venous 1.2 0.5 - 1.9 mmol/L    Comment: Performed at [facility], [address] [place], [place] [address]  CBC with Differential     Status: Abnormal   Collection Time: 11/14/18  9:06 PM  Result Value Ref Range   WBC 7.3 4.0 - 10.5 K/uL   RBC 5.46 4.22 - 5.81 MIL/uL   Hemoglobin 14.7 13.0 - 17.0 g/dL   HCT 09.849.6 11.939.0 - 14.752.0 %   MCV 90.8 80.0 - 100.0 fL   MCH 26.9 26.0 - 34.0 pg   MCHC 29.6 (L) 30.0 - 36.0 g/dL   RDW 82.913.1 56.211.5 - 13.015.5 %   Platelets 168 150 - 400 K/uL   nRBC 0.0 0.0 - 0.2 %   Neutrophils Relative % 72 %   Neutro Abs 5.3 1.7 - 7.7 K/uL   Lymphocytes Relative 14 %   Lymphs Abs 1.0 0.7 - 4.0 K/uL   Monocytes Relative 13 %   Monocytes Absolute 0.9 0.1 - 1.0 K/uL   Eosinophils Relative 1 %   Eosinophils Absolute 0.1 0.0 - 0.5 K/uL   Basophils Relative 0 %    Basophils Absolute 0.0 0.0 - 0.1 K/uL   Immature Granulocytes 0 %   Abs Immature Granulocytes 0.02 0.00 - 0.07 K/uL    Comment: Performed at [facility], [address] [place], [place] [address]  Protime-INR     Status: None   Collection Time: 11/14/18  9:06 PM  Result Value Ref Range   Prothrombin Time 13.7 11.4 - 15.2 seconds   INR 1.1 0.8 - 1.2    Comment: (NOTE) INR goal varies based on device and disease states. Performed at [facility], [address] [place], [place] [address]   CBG monitoring, ED     Status: Abnormal   Collection Time: 11/14/18  9:09 PM  Result Value Ref Range   Glucose-Capillary 219 (H) 70 - 99 mg/dL  Culture, blood (Routine x 2)     Status: None (Preliminary result)   Collection Time: 11/15/18  1:09 AM   Specimen: BLOOD LEFT HAND  Result Value Ref Range   Specimen Description BLOOD LEFT HAND    Special Requests      BOTTLES DRAWN AEROBIC AND ANAEROBIC Blood Culture results may not be optimal due to an inadequate volume of blood received in culture bottles Performed at [facility], [address] [place], [place] [address]    Culture PENDING    Report Status PENDING   Lactic acid, plasma     Status: None   Collection Time: 11/15/18  1:10 AM  Result Value Ref Range   Lactic Acid, Venous 1.1 0.5 - 1.9 mmol/L    Comment: Performed at [facility], [address] [place], [place] [address]   Dg Foot Complete Right  Result Date: 11/14/2018 CLINICAL DATA:  Diabetic wound, right great toe EXAM: RIGHT FOOT COMPLETE - 3+ VIEW COMPARISON:  None. FINDINGS: No fracture or dislocation of the right foot. There is mild right first metatarsophalangeal arthrosis. There is extensive, diffuse soft tissue edema about the included right foot and ankle. There is no bony erosion or sclerosis to suggest osteomyelitis. IMPRESSION: No fracture or dislocation of the right foot. There is mild right first metatarsophalangeal arthrosis.  There is  extensive, diffuse soft tissue edema about the included right foot and ankle. There is no bony erosion or sclerosis to suggest osteomyelitis. Please note that MRI is more sensitive for the evaluation of marrow edema and osteomyelitis if suspected. Electronically Signed   By: Eddie Candle M.D.   On: 11/14/2018 21:55    Pending Labs Unresulted Labs (From admission, onward)    Start     Ordered   11/15/18 0041  SARS Coronavirus 2 (CEPHEID - Performed in [place] hospital lab), Hosp Order  (Asymptomatic Patients Labs)  Once,   STAT    Question:  Rule Out  Answer:  Yes   11/15/18 0040   11/14/18 2106  Culture, blood (Routine x 2)  BLOOD CULTURE X 2,   STAT     11/14/18 2106          Vitals/Pain Today's Vitals   11/15/18 0030 11/15/18 0115 11/15/18 0130 11/15/18 0242  BP: (!) 178/106 (!) 167/119 (!) 176/110   Pulse: 95 (!) 103    Resp:      Temp:      TempSrc:      SpO2: 99% 100%    PainSc:    0-No pain    Isolation Precautions No active isolations  Medications Medications  sodium chloride flush (NS) 0.9 % injection 3 mL (has no administration in time range)  piperacillin-tazobactam (ZOSYN) IVPB 3.375 g (3.375 g Intravenous New Bag/Given 11/15/18 0241)  vancomycin (VANCOCIN) 1,750 mg in sodium chloride 0.9 % 500 mL IVPB (has no administration in time range)  vancomycin (VANCOCIN) 1,250 mg in sodium chloride 0.9 % 250 mL IVPB (has no administration in time range)    Mobility Walks with cane Moderate fall risk   Focused Assessments    R Recommendations: See Admitting Provider Note  Report given to:   Additional Notes:

## 2018-11-15 NOTE — Progress Notes (Addendum)
Pharmacy Antibiotic Note  Ivan Parks is a 78 y.o. male admitted on 11/14/2018 with cellulitis.  Pharmacy has been consulted for vancomycin dosing.  Plan: Vancomycin 1750 mg IV x 1 then 1250 mg IV q24 hours F/u renal function, cultures and clinical course     Temp (24hrs), Avg:98.9 F (37.2 C), Min:98.9 F (37.2 C), Max:98.9 F (37.2 C)  Recent Labs  Lab 11/14/18 2106 11/15/18 0110  WBC 7.3  --   CREATININE 1.21  --   LATICACIDVEN 1.2 1.1    CrCl cannot be calculated (Unknown ideal weight.).    No Known Allergies  Thank you for allowing pharmacy to be a part of this patient's care.  Excell Seltzer Poteet 11/15/2018 2:42 AM

## 2018-11-15 NOTE — ED Provider Notes (Signed)
[facility]ENC[facility] Provider Note   CSN: [id number] Arrival date & time: 11/14/18  2029    History   Chief Complaint Chief Complaint  Patient presents with  . Wound Infection    HPI Stephan MinisterLarry L Schriefer is a 78 y.o. male.     Patient presents to the emergency department for evaluation of foot pain.  He reports that he has been having problems with both of his feet for most of his life.  He saw a foot doctor several months ago and is currently under the care of a wound care center.  He was last seen a week ago.  He reports that he started having increased pain tonight, presents for further evaluation.  He has not had any fever.  Reports redness of both feet and lower legs, swelling and pain.     Past Medical History:  Diagnosis Date  . Acute respiratory failure (HCC)   . AKI (acute kidney injury) (HCC) 04/01/2016  . Bilateral lower extremity edema   . Bronchitis   . Chest pain 12/02/2017  . CHF (congestive heart failure) (HCC)   . Chronic respiratory failure (HCC) 11/26/2014  . COPD (chronic obstructive pulmonary disease) (HCC)   . 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 Diabetes (HCC) 05/02/2016  . DM (diabetes mellitus) (HCC)   . Essential hypertension 03/06/2015   hypertension   . HTN (hypertension)   . Hyperlipidemia   . Hypotension 03/31/2016  . Hypoxemia   . Leg edema, right- Greater than Left leg 07/05/2014   Lower extremity edema   . OSA (obstructive sleep apnea)   . Peripheral vascular disease (HCC)   . Tobacco abuse     Patient Active Problem List   Diagnosis Date Noted  . Chest pain 12/02/2017  . Peripheral vascular disease (HCC)   . Hyperlipidemia   . CHF (congestive heart failure) (HCC)   . Diabetes (HCC) 05/02/2016  . AKI (acute kidney injury) (HCC) 04/01/2016  . Hypotension  03/31/2016  . Essential hypertension 03/06/2015  . Chronic respiratory failure (HCC) 11/26/2014  . Leg edema, right- Greater than Left leg 07/05/2014  . Allergic rhinitis 11/09/2012  . 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 COLON SURGERY          Home Medications    Prior to Admission medications   Medication Sig Start Date End Date Taking? Authorizing Provider  acetaminophen (TYLENOL) 500 MG tablet Take 500 mg by mouth every 8 (eight) hours as needed for mild pain.     [provider]  albuterol (PROVENTIL) (2.5 MG/3ML) 0.083% nebulizer solution Take 3 mLs (2.5 mg total) by nebulization every 6 (six) hours as needed for wheezing or shortness of breath. 07/30/17   Lupita LeashMcQuaid, Douglas B, MD  atorvastatin (LIPITOR) 20 MG tablet Take 20 mg by mouth daily at 6 PM.  08/19/16   [provider]  furosemide (LASIX) 40 MG tablet Take 1 tablet (40 mg total) by mouth daily. 12/02/17   Quintella Reicherturner, Traci R, MD  glucose blood (ONETOUCH VERIO) test strip Use to check blood sugar 2 times per day. 06/29/16   Romero BellingEllison, Sean, MD  insulin NPH Human (NOVOLIN N RELION) 100 UNIT/ML injection Inject 0.26 mLs (26 Units total) into the skin daily with breakfast.  10/28/18   Romero BellingEllison, Sean, MD  insulin regular (NOVOLIN R RELION) 100 units/mL injection Inject 0.26 mLs (26 Units total) into the skin daily with breakfast. 10/28/18   Romero BellingEllison, Sean, MD  Insulin Syringe-Needle U-100 (RELION INSULIN SYRINGE 1ML/31G) 31G X 5/16" 1 ML MISC 1 Device by Does not apply route daily. 08/02/18   Romero BellingEllison, Sean, MD  lisinopril (PRINIVIL,ZESTRIL) 20 MG tablet Take 20 mg by mouth daily.    [provider]  [facility] LANCETS 33G MISC Use to check blood sugar 2 times per day. 06/29/16   Romero BellingEllison, Sean, MD  pantoprazole (PROTONIX) 40 MG tablet Take 1 tablet (40 mg total) by mouth daily. 12/17/17   Quintella Reicherturner, Traci R, MD  Tiotropium Bromide-Olodaterol (STIOLTO RESPIMAT) 2.5-2.5  MCG/ACT AERS Inhale 2 puffs into the lungs daily. 06/24/16   Leslye PeerByrum, Robert S, MD  Tiotropium Bromide-Olodaterol (STIOLTO RESPIMAT) 2.5-2.5 MCG/ACT AERS Inhale 2 puffs into the lungs daily. 12/09/17   Leslye PeerByrum, Robert S, MD    Family History Family History  Problem Relation Age of Onset  . Diabetes Mother   . Hypertension Mother   . Peripheral vascular disease Mother        amputation  . Diabetes Father        Right Leg Amputation-Gangrene  . Hypertension Father   . Peripheral vascular disease Father        amputation  . Cancer Father        Lead Poison-Ca  . Pneumonia Father   . Diabetes Sister   . Hypertension Sister   . Diabetes Daughter   . Hypertension Daughter     Social History Social History   Tobacco Use  . Smoking status: Former Smoker    Packs/day: 1.50    Years: 50.00    Pack years: 75.00    Types: Cigarettes    Quit date: 11/04/2010    Years since quitting: 8.0  . Smokeless tobacco: Never Used  Substance Use Topics  . Alcohol use: No    Alcohol/week: 0.0 standard drinks  . Drug use: No     Allergies   Patient has no known allergies.   Review of Systems Review of Systems  Skin: Positive for wound.  All other systems reviewed and are negative.    Physical Exam Updated Vital Signs BP (!) 167/119   Pulse (!) 103   Temp 98.9 F (37.2 C) (Oral)   Resp 16   SpO2 100%   Physical Exam Vitals signs and nursing note reviewed.  Constitutional:      General: He is not in acute distress.    Appearance: Normal appearance. He is well-developed.  HENT:     Head: Normocephalic and atraumatic.     Right Ear: Hearing normal.     Left Ear: Hearing normal.     Nose: Nose normal.  Eyes:     Conjunctiva/sclera: Conjunctivae normal.     Pupils: Pupils are equal, round, and reactive to light.  Neck:     Musculoskeletal: Normal range of motion and neck supple.  Cardiovascular:     Rate and Rhythm: Regular rhythm.     Heart sounds: S1 normal and S2 normal. No  murmur. No friction rub. No gallop.   Pulmonary:     Effort: Pulmonary effort is normal. No respiratory distress.     Breath sounds: Normal breath sounds.  Chest:     Chest wall: No tenderness.  Abdominal:     General: Bowel sounds are normal.     Palpations:  Abdomen is soft.     Tenderness: There is no abdominal tenderness. There is no guarding or rebound. Negative signs include Murphy's sign and McBurney's sign.     Hernia: No hernia is present.  Musculoskeletal: Normal range of motion.     Right lower leg: Edema present.     Left lower leg: Edema present.  Skin:    General: Skin is warm.     Findings: No rash.     Comments: Diffuse erythema bilateral lower extremities midshin to toes.  Diffuse edema of bilateral lower extremities  Scattered superficial ulcerations of shins  Ulceration, drainage with maggots between first and second toe right foot  Neurological:     Mental Status: He is alert and oriented to person, place, and time.     GCS: GCS eye subscore is 4. GCS verbal subscore is 5. GCS motor subscore is 6.     Cranial Nerves: No cranial nerve deficit.     Sensory: No sensory deficit.     Coordination: Coordination normal.  Psychiatric:        Speech: Speech normal.        Behavior: Behavior normal.        Thought Content: Thought content normal.      ED Treatments / Results  Labs (all labs ordered are listed, but only abnormal results are displayed) Labs Reviewed  COMPREHENSIVE METABOLIC PANEL - Abnormal; Notable for the following components:      Result Value   Sodium 130 (*)    Chloride 91 (*)    Glucose, Bld 207 (*)    Calcium 8.7 (*)    Albumin 2.8 (*)    GFR calc non Af Amer 57 (*)    All other components within normal limits  CBC WITH DIFFERENTIAL/PLATELET - Abnormal; Notable for the following components:   MCHC 29.6 (*)    All other components within normal limits  CBG MONITORING, ED - Abnormal; Notable for the following components:    Glucose-Capillary 219 (*)    All other components within normal limits  CULTURE, BLOOD (ROUTINE X 2)  CULTURE, BLOOD (ROUTINE X 2)  SARS CORONAVIRUS 2 (HOSPITAL ORDER, [facility])  LACTIC ACID, PLASMA  LACTIC ACID, PLASMA  PROTIME-INR    EKG None  Radiology Dg Foot Complete Right  Result Date: 11/14/2018 CLINICAL DATA:  Diabetic wound, right great toe EXAM: RIGHT FOOT COMPLETE - 3+ VIEW COMPARISON:  None. FINDINGS: No fracture or dislocation of the right foot. There is mild right first metatarsophalangeal arthrosis. There is extensive, diffuse soft tissue edema about the included right foot and ankle. There is no bony erosion or sclerosis to suggest osteomyelitis. IMPRESSION: No fracture or dislocation of the right foot. There is mild right first metatarsophalangeal arthrosis. There is extensive, diffuse soft tissue edema about the included right foot and ankle. There is no bony erosion or sclerosis to suggest osteomyelitis. Please note that MRI is more sensitive for the evaluation of marrow edema and osteomyelitis if suspected. Electronically Signed   By: Eddie Candle M.D.   On: 11/14/2018 21:55    Procedures Procedures (including critical care time)  Medications Ordered in ED Medications  sodium chloride flush (NS) 0.9 % injection 3 mL (has no administration in time range)  piperacillin-tazobactam (ZOSYN) IVPB 3.375 g (has no administration in time range)     Initial Impression / Assessment and Plan / ED Course  I have reviewed the triage vital signs and the nursing notes.  Pertinent labs & imaging results that were available during my care of the patient were reviewed by me and considered in my medical decision making (see chart for details).        Patient presents to the emergency department for evaluation of foot pain.  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  He is currently under the care of  a wound care center.  He reports that his last visit was 5 days ago.  His family members change his dressings at home.  Examination reveals extensive swelling, erythema and warmth of bilateral lower extremities with multiple areas of ulceration.  He has an area of deep ulceration between his great toe and second toe on the right foot and there are numerous maggots within the wound.  X-ray does not suggest osteomyelitis, however will likely need further work-up for possible osteomyelitis.  Will initiate broad-spectrum antibiotic coverage for diabetic foot.  Patient will require hospitalization for further management.  He does not appear to be septic at this time.  I did have to have multiple conversations with this patient to convince him to stay in the hospital.  Initially he did not want to be admitted and then agreed.  After that I had to have another conversation with him to convince him to have the COVID testing prior to admission.  Ultimately after multiple conversations patient agreed to the testing as well as inpatient treatment of his diabetic foot.  CRITICAL CARE Performed by: Gilda Creasehristopher J    Total critical care time: 35 minutes  Critical care time was exclusive of separately billable procedures and treating other patients.  Critical care was necessary to treat or prevent imminent or life-threatening deterioration.  Critical care was time spent personally by me on the following activities: development of treatment plan with patient and/or surrogate as well as nursing, discussions with consultants, evaluation of patient's response to treatment, examination of patient, obtaining history from patient or surrogate, ordering and performing treatments and interventions, ordering and review of laboratory studies, ordering and review of radiographic studies, pulse oximetry and re-evaluation of patient's condition.   Final Clinical Impressions(s) / ED Diagnoses   Final diagnoses:   Diabetic foot infection [facility](HCC)    ED Discharge Orders    None       Gilda Crease,  J, MD 11/15/18 [phone]

## 2018-11-15 NOTE — ED Notes (Signed)
Bilateral lower extremity infection. Legs are swollen, red, and draining yellowish/green fluid. Maggots found in between pt's largest toes of right foot.

## 2018-11-15 NOTE — Plan of Care (Signed)
  Problem: Education: Goal: Knowledge of General Education information will improve Description Including pain rating scale, medication(s)/side effects and non-pharmacologic comfort measures Outcome: Progressing   

## 2018-11-15 NOTE — Progress Notes (Signed)
Pt refusing insulin, provider notified.

## 2018-11-15 NOTE — H&P (Signed)
History and Physical    Ivan Parks ION:[id number] DOB: 04-15-1941 DOA: 11/14/2018  I have briefly reviewed the [facility]  PCP: Laurann MontanaWhite, [name], MD  Patient coming from: Home  Chief Complaint: Foot pain  HPI: Ivan Parks is a 78 y.o. male with medical history significant of poorly controlled diabetes, chronic diastolic CHF, COPD, hypertension, hyperlipidemia, PVD, presents to the hospital with bilateral foot pain, right greater than left.  Patient is a very poor historian but tells me that he has been having problems with his feet for a long time.  He is followed in wound care cannot tell me more.  He denies any fever or chills, denies any chest pain, denies any shortness of breath.  He has no abdominal pain, no nausea or vomiting.  Patient also tells me that he has been having maggots in both his feet.  ED Course: ED he is afebrile, slightly hypertensive, heart rate in the low 100s.  Blood work shows a sodium of 130, creatinine 1.2, lactic acid is normal, CBC unremarkable. X-ray of the right foot showed extensive, diffuse soft tissue edema in the right foot and ankle without findings suggestive of osteomyelitis.  On initial evaluation per ED notes maggots were found in between patient's largest toes of the right foot.  He was given broad-spectrum antibiotics and was admitted to the hospital  Review of Systems: As per HPI otherwise 10 point review of systems negative.   Past Medical History:  Diagnosis Date  . Acute respiratory failure (HCC)   . AKI (acute kidney injury) (HCC) 04/01/2016  . Bilateral lower extremity edema   . Bronchitis   . Chest pain 12/02/2017  . CHF (congestive heart failure) (HCC)   . Chronic respiratory failure (HCC) 11/26/2014  . COPD (chronic obstructive pulmonary disease) (HCC)   . 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 Diabetes (HCC) 05/02/2016  . DM (diabetes mellitus) (HCC)   . Essential hypertension 03/06/2015   hypertension   . HTN (hypertension)   . Hyperlipidemia   . Hypotension 03/31/2016  . Hypoxemia   . Leg edema, right- Greater than Left leg 07/05/2014   Lower extremity edema   . OSA (obstructive sleep apnea)   . Peripheral vascular disease (HCC)   . Tobacco abuse     Past Surgical History:  Procedure Laterality Date  . COLON SURGERY       reports that he quit smoking about 8 years ago. His smoking use included cigarettes. He has a 75.00 pack-year smoking history. He has never used smokeless tobacco. He reports that he does not drink alcohol or use drugs.  No Known Allergies  Family History  Problem Relation Age of Onset  . Diabetes Mother   . Hypertension Mother   . Peripheral vascular disease Mother        amputation  . Diabetes Father        Right Leg Amputation-Gangrene  . Hypertension Father   . Peripheral vascular disease Father        amputation  . Cancer Father        Lead Poison-Ca  . Pneumonia Father   . Diabetes Sister   . Hypertension Sister   . Diabetes Daughter   . Hypertension Daughter     Prior to Admission  medications   Medication Sig Start Date End Date Taking? Authorizing Provider  furosemide (LASIX) 40 MG tablet Take 1 tablet (40 mg total) by mouth daily. 12/02/17  Yes Turner, Cornelious Bryantraci R, MD  insulin NPH Human (NOVOLIN N RELION) 100 UNIT/ML injection Inject 0.26 mLs (26 Units total) into the skin daily with breakfast. 10/28/18  Yes Romero BellingEllison, Sean, MD  insulin regular (NOVOLIN R RELION) 100 units/mL injection Inject 0.26 mLs (26 Units total) into the skin daily with breakfast. 10/28/18  Yes Romero BellingEllison, Sean, MD  lisinopril (PRINIVIL,ZESTRIL) 20 MG tablet Take 20 mg by mouth daily.   Yes [provider]  pantoprazole (PROTONIX) 40 MG tablet Take 1 tablet (40 mg total) by mouth daily. 12/17/17  Yes Turner, Cornelious Bryantraci R,  MD  albuterol (PROVENTIL) (2.5 MG/3ML) 0.083% nebulizer solution Take 3 mLs (2.5 mg total) by nebulization every 6 (six) hours as needed for wheezing or shortness of breath. Patient not taking: Reported on 11/15/2018 07/30/17   Max FickleMcQuaid, Douglas B, MD  glucose blood (ONETOUCH VERIO) test strip Use to check blood sugar 2 times per day. 06/29/16   Romero BellingEllison, Sean, MD  Insulin Syringe-Needle U-100 (RELION INSULIN SYRINGE 1ML/31G) 31G X 5/16" 1 ML MISC 1 Device by Does not apply route daily. 08/02/18   Romero BellingEllison, Sean, MD  [facility] DEL[facility] LANCETS 33G MISC Use to check blood sugar 2 times per day. 06/29/16   Romero BellingEllison, Sean, MD  Tiotropium Bromide-Olodaterol (STIOLTO RESPIMAT) 2.5-2.5 MCG/ACT AERS Inhale 2 puffs into the lungs daily. Patient not taking: Reported on 11/15/2018 06/24/16   Leslye PeerByrum, Robert S, MD  Tiotropium Bromide-Olodaterol (STIOLTO RESPIMAT) 2.5-2.5 MCG/ACT AERS Inhale 2 puffs into the lungs daily. Patient not taking: Reported on 11/15/2018 12/09/17   Leslye PeerByrum, Robert S, MD    Physical Exam: Vitals:   11/15/18 0330 11/15/18 0400 11/15/18 0615 11/15/18 0645  BP: (!) 149/64 (!) 145/85  (!) 174/94  Pulse: (!) 103 (!) 105  (!) 103  Resp: (!) 22 (!) 27    Temp:    98.4 F (36.9 C)  TempSrc:    Oral  SpO2: 97% 98%  100%  Weight:   88.5 kg    Constitutional: NAD, calm, comfortable Eyes: PERRL, lids and conjunctivae normal ENMT: Mucous membranes are moist.  Neck: normal, supple Respiratory: clear to auscultation bilaterally, no wheezing, no crackles. Normal respiratory effort.  Cardiovascular: Regular rate and rhythm, no murmurs / rubs / gallops.  Chronic lower extremity edema Abdomen: no tenderness, no masses palpated. Bowel sounds positive.  Skin: Changes consistent with chronic venous stasis bilaterally, bilateral feet swollen, erythematous, warm.  Small ulcers in between toes on the right foot Neurologic: CN 2-12 grossly intact. Strength 5/5 in all 4.  Psychiatric: Normal judgment and insight.  Alert and oriented x 3. Normal mood.   Labs on Admission: I have personally reviewed following labs and imaging studies  CBC: Recent Labs  Lab 11/14/18 2106  WBC 7.3  NEUTROABS 5.3  HGB 14.7  HCT 49.6  MCV 90.8  PLT 168   Basic Metabolic Panel: Recent Labs  Lab 11/14/18 2106  NA 130*  K 4.8  CL 91*  CO2 29  GLUCOSE 207*  BUN 21  CREATININE 1.21  CALCIUM 8.7*   GFR: Estimated Creatinine Clearance: 50.5 mL/min (by C-G formula based on SCr of 1.21 mg/dL). Liver Function Tests: Recent Labs  Lab 11/14/18 2106  AST 28  ALT 14  ALKPHOS 65  BILITOT 0.5  PROT 7.3  ALBUMIN 2.8*   No results for input(s):  LIPASE, AMYLASE in the last 168 hours. No results for input(s): AMMONIA in the last 168 hours. Coagulation Profile: Recent Labs  Lab 11/14/18 2106  INR 1.1   Cardiac Enzymes: No results for input(s): CKTOTAL, CKMB, CKMBINDEX, TROPONINI in the last 168 hours. BNP (last 3 results) Recent Labs    12/02/17 1453  PROBNP 22   HbA1C: No results for input(s): HGBA1C in the last 72 hours. CBG: Recent Labs  Lab 11/14/18 2109 11/15/18 0924  GLUCAP 219* 171*   Lipid Profile: No results for input(s): CHOL, HDL, LDLCALC, TRIG, CHOLHDL, LDLDIRECT in the last 72 hours. Thyroid Function Tests: No results for input(s): TSH, T4TOTAL, FREET4, T3FREE, THYROIDAB in the last 72 hours. Anemia Panel: No results for input(s): VITAMINB12, FOLATE, FERRITIN, TIBC, IRON, RETICCTPCT in the last 72 hours. Urine analysis:    Component Value Date/Time   COLORURINE COLORLESS (A) 10/16/2016 1632   APPEARANCEUR CLEAR 10/16/2016 1632   LABSPEC 1.004 (L) 10/16/2016 1632   PHURINE 7.0 10/16/2016 1632   GLUCOSEU NEGATIVE 10/16/2016 1632   HGBUR NEGATIVE 10/16/2016 1632   BILIRUBINUR NEGATIVE 10/16/2016 1632   KETONESUR NEGATIVE 10/16/2016 1632   PROTEINUR NEGATIVE 10/16/2016 1632   UROBILINOGEN 0.2 09/19/2011 1701   NITRITE NEGATIVE 10/16/2016 1632   LEUKOCYTESUR NEGATIVE  10/16/2016 1632     Radiological Exams on Admission: Dg Foot Complete Right  Result Date: 11/14/2018 CLINICAL DATA:  Diabetic wound, right great toe EXAM: RIGHT FOOT COMPLETE - 3+ VIEW COMPARISON:  None. FINDINGS: No fracture or dislocation of the right foot. There is mild right first metatarsophalangeal arthrosis. There is extensive, diffuse soft tissue edema about the included right foot and ankle. There is no bony erosion or sclerosis to suggest osteomyelitis. IMPRESSION: No fracture or dislocation of the right foot. There is mild right first metatarsophalangeal arthrosis. There is extensive, diffuse soft tissue edema about the included right foot and ankle. There is no bony erosion or sclerosis to suggest osteomyelitis. Please note that MRI is more sensitive for the evaluation of marrow edema and osteomyelitis if suspected. Electronically Signed   By: Eddie Candle M.D.   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  I am concerned about a deeper infection given appearance of the x-rays.  We will obtain an MRI of the feet, will include the left foot as well since it looks cellulitic however better than the right. -Cultures obtained, continue broad-spectrum antibiotics with vancomycin, ceftriaxone as well as metronidazole -Orthopedic consult possible pending MRI results -ABIs done a year ago in June 2019 showed right ankle brachial index with mild right lower extremity arterial disease, and normal on the left  Active Problems Type 2 diabetes mellitus -Patient on an unusual regimen of 26 units of NPH along with 36 units of regular, 1 time daily.  Reports ranging sugars between  hypo-and hyperglycemia -We will place on Lantus along with sliding scale while hospitalized  Chronic diastolic CHF -Appears overall compensated, has some lower extremity swelling which I suspect chronic -Continue Lasix  Hypertension -Continue Lasix and lisinopril  COPD -No wheezing, this appears stable, continue PRN nebulizer   DVT prophylaxis: Lovenox Code Status: Full code Family Communication: No family at bedside Disposition  Plan: Home when ready Consults called: None   Pamella Pertostin Vicky Mccanless, MD, PhD [facility]  Contact via [email or web address]  [facility] Office Info P: [phone] F: [phone]   11/15/2018, 10:27 AM

## 2018-11-16 DIAGNOSIS — B999 Unspecified infectious disease: Secondary | ICD-10-CM

## 2018-11-16 LAB — CBC
HCT: 45.1 % (ref 39.0–52.0)
Hemoglobin: 13.1 g/dL (ref 13.0–17.0)
MCH: 26.5 pg (ref 26.0–34.0)
MCHC: 29 g/dL — ABNORMAL LOW (ref 30.0–36.0)
MCV: 91.3 fL (ref 80.0–100.0)
Platelets: 181 10*3/uL (ref 150–400)
RBC: 4.94 MIL/uL (ref 4.22–5.81)
RDW: 13 % (ref 11.5–15.5)
WBC: 6.7 10*3/uL (ref 4.0–10.5)
nRBC: 0 % (ref 0.0–0.2)

## 2018-11-16 LAB — GLUCOSE, CAPILLARY
Glucose-Capillary: 180 mg/dL — ABNORMAL HIGH (ref 70–99)
Glucose-Capillary: 105 mg/dL — ABNORMAL HIGH (ref 70–99)
Glucose-Capillary: 149 mg/dL — ABNORMAL HIGH (ref 70–99)
Glucose-Capillary: 213 mg/dL — ABNORMAL HIGH (ref 70–99)

## 2018-11-16 LAB — COMPREHENSIVE METABOLIC PANEL
ALT: 14 U/L (ref 0–44)
AST: 23 U/L (ref 15–41)
Albumin: 2.4 g/dL — ABNORMAL LOW (ref 3.5–5.0)
Alkaline Phosphatase: 52 U/L (ref 38–126)
Anion gap: 8 (ref 5–15)
BUN: 15 mg/dL (ref 8–23)
CO2: 32 mmol/L (ref 22–32)
Calcium: 8.3 mg/dL — ABNORMAL LOW (ref 8.9–10.3)
Chloride: 97 mmol/L — ABNORMAL LOW (ref 98–111)
Creatinine, Ser: 1.06 mg/dL (ref 0.61–1.24)
GFR calc Af Amer: >60 mL/min (ref >60)
GFR calc non Af Amer: >60 mL/min (ref >60)
Glucose, Bld: 210 mg/dL — ABNORMAL HIGH (ref 70–99)
Potassium: 4.4 mmol/L (ref 3.5–5.1)
Sodium: 137 mmol/L (ref 135–145)
Total Bilirubin: 0.5 mg/dL (ref 0.3–1.2)
Total Protein: 6.2 g/dL — ABNORMAL LOW (ref 6.5–8.1)

## 2018-11-16 LAB — HEMOGLOBIN A1C
Hgb A1c MFr Bld: 8.1 % — ABNORMAL HIGH (ref 4.8–5.6)
Mean Plasma Glucose: 185.77 mg/dL

## 2018-11-16 MED ORDER — PANTOPRAZOLE SODIUM 40 MG PO TBEC
40.0000 mg | DELAYED_RELEASE_TABLET | Freq: Every day | ORAL | Status: DC
  Administered 2018-11-16 – 2018-11-22 (×6): 40 mg via ORAL
  Filled 2018-11-16 (×8): qty 1

## 2018-11-16 MED ORDER — LISINOPRIL 20 MG PO TABS
20.0000 mg | ORAL_TABLET | Freq: Every day | ORAL | Status: DC
  Administered 2018-11-16 – 2018-11-19 (×4): 20 mg via ORAL
  Filled 2018-11-16 (×4): qty 1

## 2018-11-16 MED ORDER — FUROSEMIDE 40 MG PO TABS
40.0000 mg | ORAL_TABLET | Freq: Every day | ORAL | Status: DC
  Administered 2018-11-16 – 2018-11-19 (×4): 40 mg via ORAL
  Filled 2018-11-16 (×4): qty 1

## 2018-11-16 MED ORDER — ALBUTEROL SULFATE (2.5 MG/3ML) 0.083% IN NEBU: 2.5000 mg | INHALATION_SOLUTION | Freq: Four times a day (QID) | RESPIRATORY_TRACT | Status: DC | PRN

## 2018-11-16 NOTE — Progress Notes (Signed)
PROGRESS NOTE  Ivan Parks [id number] DOB: [birth year] DOA: 11/14/2018 PCP: Laurann MontanaWhite, [name], MD  Brief History   Ivan Parks is a 78 y.o. male with medical history significant of poorly controlled diabetes, chronic diastolic CHF, COPD, hypertension, hyperlipidemia, PVD, presents to the hospital with bilateral foot pain, right greater than left.  Patient is a very poor historian but tells me that he has been having problems with his feet for a long time.  He is followed in wound care cannot tell me more.  He denies any fever or chills, denies any chest pain, denies any shortness of breath.  He has no abdominal pain, no nausea or vomiting.  Patient also tells me that he has been having maggots in both his feet.  ED Course: ED he is afebrile, slightly hypertensive, heart rate in the low 100s.  Blood work shows a sodium of 130, creatinine 1.2, lactic acid is normal, CBC unremarkable. X-ray of the right foot showed extensive, diffuse soft tissue edema in the right foot and ankle without findings suggestive of osteomyelitis.  On initial evaluation per ED notes maggots were found in between patient's largest toes of the right foot.  He was given broad-spectrum antibiotics and was admitted to the hospital  The patient was admitted to a medical bed. He has been started on IV Vancomycin, Rocephin, and Flagyl. MRI without contrast of the bilateral feet were obtained. They both demonstrated diffuse myositis without definite findings for pyomyositis. The right foot demonstrated diffuse cellulitis, but no soft tissue abscess. Neither foot demonstrated findings suspicious for osteomyelitis or septic arthritis.   Consultants  . None  Procedures  . None  Antibiotics   Anti-infectives (From admission, onward)   Start     Dose/Rate Route Frequency Ordered Stop   11/16/18 0300  vancomycin (VANCOCIN) 1,250 mg in sodium chloride 0.9 % 250 mL IVPB     1,250 mg 166.7 mL/hr over 90 Minutes Intravenous Every  24 hours 11/15/18 0243     11/15/18 1200  cefTRIAXone (ROCEPHIN) 1 g in sodium chloride 0.9 % 100 mL IVPB  Status:  Discontinued     1 g 200 mL/hr over 30 Minutes Intravenous Every 24 hours 11/15/18 1034 11/15/18 1109   11/15/18 1200  metroNIDAZOLE (FLAGYL) IVPB 500 mg     500 mg 100 mL/hr over 60 Minutes Intravenous Every 8 hours 11/15/18 1034     11/15/18 1200  cefTRIAXone (ROCEPHIN) 2 g in sodium chloride 0.9 % 100 mL IVPB     2 g 200 mL/hr over 30 Minutes Intravenous Every 24 hours 11/15/18 1109     11/15/18 0245  vancomycin (VANCOCIN) 1,750 mg in sodium chloride 0.9 % 500 mL IVPB     1,750 mg 250 mL/hr over 120 Minutes Intravenous  Once 11/15/18 0235 11/15/18 0532   11/15/18 0230  piperacillin-tazobactam (ZOSYN) IVPB 3.375 g     3.375 g 100 mL/hr over 30 Minutes Intravenous  Once 11/15/18 0229 11/15/18 0327    .  Marland Kitchen.   Subjective  The patient is sitting up in bed complaining of how tight his socks are on his feet and lower extremities. No other complaints.  Objective   Vitals:  Vitals:   11/16/18 0412 11/16/18 0755  BP: 132/73 (!) 145/73  Pulse: 95 94  Resp: 16 18  Temp: 98.6 F (37 C) 98.3 F (36.8 C)  SpO2: 96% 97%    Exam:  Constitutional:  . Appears calm and comfortable Eyes:  . pupils and irises  appear normal . Normal lids and conjunctivae ENMT:  . grossly normal hearing  . Lips appear normal . external ears, nose appear normal . Oropharynx: mucosa, tongue,posterior pharynx appear normal Neck:  . neck appears normal, no masses, normal ROM, supple . no thyromegaly Respiratory:  . CTA bilaterally, no w/r/r.  . Respiratory effort normal. No retractions or accessory muscle use Cardiovascular:  . RRR, no m/r/g . No LE extremity edema   . Normal pedal pulses Abdomen:  . Abdomen appears normal; no tenderness or masses . No hernias . No HSM Musculoskeletal:  . Bilateral lower extremities are edematous, erythematous and covered with scaly skin. o No  cyanosis or clubbing. Skin:  . Skin on lower extremities is tight, scaly, and erythematous. There is an ulcerated area between the toes of the right foot.  . Skin elsewhere is warm, dry, and intact. Neurologic:  . CN 2-12 intact . Sensation all 4 extremities intact Psychiatric:  . Mental status o Mood, affect appropriate o Orientation to person, place, time  . judgment and insight appear impaired.   I have personally reviewed the following:   Today's Data  . CBC, CMP, Vitals.  Micro Data  . Blood cultures  Imaging  . MRI of bilateral foot: results detailed below.  Scheduled Meds: . enoxaparin (LOVENOX) injection  40 mg Subcutaneous Q24H  . furosemide  40 mg Oral Daily  . insulin aspart  0-15 Units Subcutaneous TID WC  . insulin aspart  0-5 Units Subcutaneous QHS  . insulin detemir  15 Units Subcutaneous Daily  . lisinopril  20 mg Oral Daily  . pantoprazole  40 mg Oral Daily  . sodium chloride flush  3 mL Intravenous Once   Continuous Infusions: . cefTRIAXone (ROCEPHIN)  IV 2 g (11/16/18 1236)  . metronidazole 500 mg (11/16/18 1235)  . 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  I am concerned about a deeper infection given appearance of the x-rays.  We will obtain an MRI of the feet, will include the left foot as well since it looks cellulitic however better than the right. The patient has been continued broad-spectrum antibiotics with vancomycin, ceftriaxone as well as metronidazole. Will stop Flagyl.  MRI results are negative for signs of osteomyelitis. Will not consult orthopedic surgery now. ABIs done a year ago in June 2019 showed right ankle  brachial index with mild right lower extremity arterial disease, and normal on the left. Blood cultures have been obtained. Monitor.  Type 2 diabetes mellitus: Patient on an unusual regimen of 26 units of NPH along with 36 units of regular, 1 time daily.  Reports ranging sugars between hypo-and hyperglycemia. The patient will receive Lantus along with sliding scale while hospitalized. Monitor FSBS. Hypoglycemic protocol and a carbohydrated controlled diet have been ordered.  Chronic diastolic CHF: Appears overall compensated, has some lower extremity swelling which I suspect chronic. Continue Lasix. Elevate extremities as much as possible.  Hypertension: Continue Lasix and lisinopril. Monitor.  COPD: Noted and Stable. As needed neb treatments have been made available.  I have seen and examined this patient myself. I have spent 35 minutes  in his evaluation and care.  DVT prophylaxis: Lovenox Code Status: Full code Family Communication: No family at bedside Disposition Plan: Home when ready Jad Johansson, DO [facility] Direct contact: see [email or web address]  7PM-7AM contact night coverage as above 11/16/2018, 4:20 PM  LOS: 1 day

## 2018-11-16 NOTE — Progress Notes (Signed)
Pt noted with occasional forger fulness. Able to reorient quickly. Pt can ambulated to the bedside using front wheel walker and stand by assist. Personal cane from home at the bedside. B/L LE's  Noted dry and flaking with scattered multiple wounds in various healing stages.    IVF abx infusing in without difficultly. Pt tolerates carb mod diet.   Spoke with wife concerning pt's Black bag with clothing items that was dropped off. Per secretary Mikle Bosworth, " day shift tech was supposed to pick it up. The [facility] was never retrieved. The secretary called the front security and ED for location of the black bag concerning personal items: T shirts, underwear, basketball shorts. Will f/u with day shift RN and Network engineer for continued search for bag.

## 2018-11-16 NOTE — Plan of Care (Signed)
  Problem: Clinical Measurements: Goal: Ability to maintain clinical measurements within normal limits will improve Outcome: Progressing Goal: Diagnostic test results will improve Outcome: Progressing   Problem: Elimination: Goal: Will not experience complications related to bowel motility Outcome: Progressing   

## 2018-11-16 NOTE — Plan of Care (Signed)
  Problem: Safety: Goal: Ability to remain free from injury will improve Outcome: Progressing   Problem: Skin Integrity: Goal: Risk for impaired skin integrity will decrease Outcome: Progressing   

## 2018-11-17 LAB — BASIC METABOLIC PANEL
Anion gap: 10 (ref 5–15)
BUN: 10 mg/dL (ref 8–23)
CO2: 33 mmol/L — ABNORMAL HIGH (ref 22–32)
Calcium: 8.6 mg/dL — ABNORMAL LOW (ref 8.9–10.3)
Chloride: 94 mmol/L — ABNORMAL LOW (ref 98–111)
Creatinine, Ser: 1.13 mg/dL (ref 0.61–1.24)
GFR calc Af Amer: >60 mL/min (ref >60)
GFR calc non Af Amer: >60 mL/min (ref >60)
Glucose, Bld: 164 mg/dL — ABNORMAL HIGH (ref 70–99)
Potassium: 4.4 mmol/L (ref 3.5–5.1)
Sodium: 137 mmol/L (ref 135–145)

## 2018-11-17 LAB — CBC WITH DIFFERENTIAL/PLATELET
Abs Immature Granulocytes: 0.02 10*3/uL (ref 0.00–0.07)
Basophils Absolute: 0 10*3/uL (ref 0.0–0.1)
Basophils Relative: 0 %
Eosinophils Absolute: 0.2 10*3/uL (ref 0.0–0.5)
Eosinophils Relative: 3 %
HCT: 49.2 % (ref 39.0–52.0)
Hemoglobin: 14.6 g/dL (ref 13.0–17.0)
Immature Granulocytes: 0 %
Lymphocytes Relative: 10 %
Lymphs Abs: 0.7 10*3/uL (ref 0.7–4.0)
MCH: 27.4 pg (ref 26.0–34.0)
MCHC: 29.7 g/dL — ABNORMAL LOW (ref 30.0–36.0)
MCV: 92.5 fL (ref 80.0–100.0)
Monocytes Absolute: 0.6 10*3/uL (ref 0.1–1.0)
Monocytes Relative: 9 %
Neutro Abs: 5.4 10*3/uL (ref 1.7–7.7)
Neutrophils Relative %: 78 %
Platelets: UNDETERMINED 10*3/uL (ref 150–400)
RBC: 5.32 MIL/uL (ref 4.22–5.81)
RDW: 13 % (ref 11.5–15.5)
WBC: 6.9 10*3/uL (ref 4.0–10.5)
nRBC: 0 % (ref 0.0–0.2)

## 2018-11-17 LAB — GLUCOSE, CAPILLARY
Glucose-Capillary: 172 mg/dL — ABNORMAL HIGH (ref 70–99)
Glucose-Capillary: 157 mg/dL — ABNORMAL HIGH (ref 70–99)
Glucose-Capillary: 138 mg/dL — ABNORMAL HIGH (ref 70–99)
Glucose-Capillary: 192 mg/dL — ABNORMAL HIGH (ref 70–99)

## 2018-11-17 MED ORDER — TRAMADOL HCL 50 MG PO TABS
50.0000 mg | ORAL_TABLET | Freq: Four times a day (QID) | ORAL | Status: AC | PRN
  Administered 2018-11-17 – 2018-11-18 (×2): 50 mg via ORAL
  Filled 2018-11-17 (×2): qty 1

## 2018-11-17 NOTE — Progress Notes (Signed)
Patient Demographics:    Ivan Parks, is a 78 y.o. male, DOB - 12-20-1940, JXB:[id number]  Admit date - 11/14/2018   Admitting Physician Ivan GiovanniVasundhra Rathore, MD  Outpatient Primary MD for the patient is Ivan Parks, [name], MD  LOS - 2   Chief Complaint  Patient presents with  . Wound Infection        Subjective:    Ivan Parks today has no fevers, no emesis,  No chest pain,     Assessment  & Plan :    Active Problems:   COPD, severe (HCC)   Diabetes (HCC)   Peripheral vascular disease (HCC)   Hyperlipidemia   CHF (congestive heart failure) (HCC)   Infection   Diabetic foot ulcers (HCC)  Brief Summary- Joseth L Enochis a 77 y.o.malewith medical history significant ofpoorly controlled diabetes, chronic diastolic CHF, COPD, hypertension, hyperlipidemia, PVD, presents to the hospital with bilateral foot pain,right greater than left. Patient is a very poor historian but tells me that he has been having problems with his feet for a long time. He is followed in wound care cannot tell me more. He denies any fever or chills, denies any chest pain, denies any shortness of breath. He has no abdominal pain, no nausea or vomiting. Patient also tells me that he has been having maggots in both his feet.  ED Course:ED he is afebrile, slightly hypertensive, heart rate in the low 100s. Blood work shows a sodium of 130, creatinine 1.2, lactic acid is normal, CBC unremarkable. X-ray of the right foot showed extensive, diffuse soft tissue edema in the right foot and ankle without findings suggestive of osteomyelitis.On initial evaluation per ED notes maggots were found in between patient's largest toesof the right foot. He was given broad-spectrum antibiotics and was admitted to the hospital  The patient was admitted to a medical bed. He has been started on IV Vancomycin, Rocephin, and Flagyl. MRI without  contrast of the bilateral feet were obtained. They both demonstrated diffuse myositis without definite findings for pyomyositis. The right foot demonstrated diffuse cellulitis, but no soft tissue abscess. Neither foot demonstrated findings suspicious for osteomyelitis or septic arthritis.   Patient with history of PVD, had maggots in both feet on admission  A/p  1) bilateral lower extremity diabetic ulcers with cellulitis--- had maggots in both feet on admission---  x-rays and MRI without osteomyelitis,, ABIs from 11/04/2017 showed right ankle brachial index with mild right lower extremity arterial disease, and normal on the left-----continue IV Rocephin and vancomycin, Flagyl discontinued 11/17/2018   2)DM2-A1c is 8.1, continue Levemir insulin 15 units daily, Use Novolog/Humalog Sliding scale insulin with Accu-Cheks/Fingersticks as ordered   3)HFpEF--- compensated dCHF, no acute flareup continue Lasix, continue lisinopril  4) generalized weakness and debility--- Physical therapy eval  Disposition/Need for in-Hospital Stay- patient unable to be discharged at this time due to bilateral cellulitis requiring IV antibiotics  Code Status : Full  Family Communication:   NA (patient is alert, awake and coherent)   Disposition Plan  : Await physical therapy evaluation  Consults  :  *Wound care consult DVT Prophylaxis  :  Lovenox -   Lab Results  Component Value Date   PLT PLATELET CLUMPS NOTED ON SMEAR, UNABLE TO ESTIMATE 11/17/2018  Inpatient Medications  Scheduled Meds: . enoxaparin (LOVENOX) injection  40 mg Subcutaneous Q24H  . furosemide  40 mg Oral Daily  . insulin aspart  0-15 Units Subcutaneous TID WC  . insulin aspart  0-5 Units Subcutaneous QHS  . insulin detemir  15 Units Subcutaneous Daily  . lisinopril  20 mg Oral Daily  . pantoprazole  40 mg Oral Daily  . sodium chloride flush  3 mL Intravenous Once   Continuous Infusions: . cefTRIAXone (ROCEPHIN)  IV 2 g (11/17/18  1127)  . vancomycin 1,250 mg (11/17/18 0352)   PRN Meds:.acetaminophen **OR** acetaminophen, albuterol, ondansetron **OR** ondansetron (ZOFRAN) IV    Anti-infectives (From admission, onward)   Start     Dose/Rate Route Frequency Ordered Stop   11/16/18 0300  vancomycin (VANCOCIN) 1,250 mg in sodium chloride 0.9 % 250 mL IVPB     1,250 mg 166.7 mL/hr over 90 Minutes Intravenous Every 24 hours 11/15/18 0243     11/15/18 1200  cefTRIAXone (ROCEPHIN) 1 g in sodium chloride 0.9 % 100 mL IVPB  Status:  Discontinued     1 g 200 mL/hr over 30 Minutes Intravenous Every 24 hours 11/15/18 1034 11/15/18 1109   11/15/18 1200  metroNIDAZOLE (FLAGYL) IVPB 500 mg  Status:  Discontinued     500 mg 100 mL/hr over 60 Minutes Intravenous Every 8 hours 11/15/18 1034 11/17/18 1056   11/15/18 1200  cefTRIAXone (ROCEPHIN) 2 g in sodium chloride 0.9 % 100 mL IVPB     2 g 200 mL/hr over 30 Minutes Intravenous Every 24 hours 11/15/18 1109     11/15/18 0245  vancomycin (VANCOCIN) 1,750 mg in sodium chloride 0.9 % 500 mL IVPB     1,750 mg 250 mL/hr over 120 Minutes Intravenous  Once 11/15/18 0235 11/15/18 0532   11/15/18 0230  piperacillin-tazobactam (ZOSYN) IVPB 3.375 g     3.375 g 100 mL/hr over 30 Minutes Intravenous  Once 11/15/18 0229 11/15/18 0327        Objective:   Vitals:   11/17/18 0354 11/17/18 0357 11/17/18 0809 11/17/18 1541  BP: (!) 161/92 (!) 147/65 (!) 136/92 127/69  Pulse: (!) 106 (!) 105 96 (!) 101  Resp: Temp: 98.2 F (36.8 C) 99 F (37.2 C) 98.2 F (36.8 C) 98.3 F (36.8 C)  TempSrc: Oral Oral Oral Oral  SpO2: 98% 99% 94%   Weight:        Wt Readings from Last 3 Encounters:  11/15/18 88.5 kg  10/28/18 86.6 kg  08/02/18 87.1 kg     Intake/Output Summary (Last 24 hours) at 11/17/2018 1745 Last data filed at 11/17/2018 1300 Gross per 24 hour  Intake 480 ml  Output -  Net 480 ml     Physical Exam  Gen:- Awake Alert,  In no apparent distress  HEENT:-  [place].AT, No sclera icterus Neck-Supple Neck,No JVD,.  Lungs-  CTAB , fair symmetrical air movement CV- S1, S2 normal, regular  Abd-  +ve B.Sounds, Abd Soft, No tenderness,    Extremity/Skin:-  pedal pulses present , bilateral lower extremity ulcers, erythema, warmth, tenderness, edema--- No streaking or purulent drainage Psych-affect is appropriate, oriented x3 Neuro-no new focal deficits, no tremors   Data Review:   Micro Results Recent Results (from the past 240 hour(s))  Culture, blood (Routine x 2)     Status: None (Preliminary result)   Collection Time: 11/14/18  9:14 PM   Specimen: BLOOD LEFT HAND  Result Value Ref Range  Status   Specimen Description BLOOD LEFT HAND  Final   Special Requests   Final    BOTTLES DRAWN AEROBIC AND ANAEROBIC Blood Culture results may not be optimal due to an inadequate volume of blood received in culture bottles   Culture   Final    NO GROWTH 3 DAYS Performed at [facility], [place] [address], [place], [place] [address]    Report Status PENDING  Incomplete  Culture, blood (Routine x 2)     Status: None (Preliminary result)   Collection Time: 11/15/18  1:09 AM   Specimen: BLOOD LEFT HAND  Result Value Ref Range Status   Specimen Description BLOOD LEFT HAND  Final   Special Requests   Final    BOTTLES DRAWN AEROBIC AND ANAEROBIC Blood Culture results may not be optimal due to an inadequate volume of blood received in culture bottles   Culture   Final    NO GROWTH 2 DAYS Performed at [facility], [place] [address], [place], [place] [address]    Report Status PENDING  Incomplete  SARS Coronavirus 2 (CEPHEID - Performed in [place] hospital lab), Hosp Order     Status: None   Collection Time: 11/15/18  1:52 AM   Specimen: Nasopharyngeal Swab  Result Value Ref Range Status   SARS Coronavirus 2 NEGATIVE NEGATIVE Final    Comment: (NOTE) If result is NEGATIVE SARS-CoV-2 target nucleic acids are NOT DETECTED. The SARS-CoV-2 RNA is  generally detectable in upper and lower  respiratory specimens during the acute phase of infection. The lowest  concentration of SARS-CoV-2 viral copies this assay can detect is 250  copies / mL. A negative result does not preclude SARS-CoV-2 infection  and should not be used as the sole basis for treatment or other  patient management decisions.  A negative result may occur with  improper specimen collection / handling, submission of specimen other  than nasopharyngeal swab, presence of viral mutation(s) within the  areas targeted by this assay, and inadequate number of viral copies  (<250 copies / mL). A negative result must be combined with clinical  observations, patient history, and epidemiological information. If result is POSITIVE SARS-CoV-2 target nucleic acids are DETECTED. The SARS-CoV-2 RNA is generally detectable in upper and lower  respiratory specimens dur ing the acute phase of infection.  Positive  results are indicative of active infection with SARS-CoV-2.  Clinical  correlation with patient history and other diagnostic information is  necessary to determine patient infection status.  Positive results do  not rule out bacterial infection or co-infection with other viruses. If result is PRESUMPTIVE POSTIVE SARS-CoV-2 nucleic acids MAY BE PRESENT.   A presumptive positive result was obtained on the submitted specimen  and confirmed on repeat testing.  While 2019 novel coronavirus  (SARS-CoV-2) nucleic acids may be present in the submitted sample  additional confirmatory testing may be necessary for epidemiological  and / or clinical management purposes  to differentiate between  SARS-CoV-2 and other Sarbecovirus currently known to infect humans.  If clinically indicated additional testing with an alternate test  methodology [phone]) is advised. The SARS-CoV-2 RNA is generally  detectable in upper and lower respiratory sp ecimens during the acute  phase of infection.  The expected result is Negative. Fact Sheet for Patients:  StrictlyIdeas.no Fact Sheet for Healthcare Providers: BankingDealers.co.za This test is not yet approved or cleared by the [place] FDA and has been authorized for detection and/or diagnosis of SARS-CoV-2  by FDA under an Emergency Use Authorization (EUA).  This EUA will remain in effect (meaning this test can be used) for the duration of the COVID-19 declaration under Section 564(b)(1) of the Act, 21 U.S.C. section 360bbb-3(b)(1), unless the authorization is terminated or revoked sooner. Performed at [facility], [address] [place], [place] [address]     Radiology Reports Mr Foot Left Wo Contrast  Result Date: 11/15/2018 CLINICAL DATA:  Diffuse foot swelling.  Diabetic. EXAM: MRI OF THE LEFT FOOT WITHOUT CONTRAST TECHNIQUE: Multiplanar, multisequence MR imaging of the left foot was performed. No intravenous contrast was administered. COMPARISON:  None. FINDINGS: Diffuse and fairly marked subcutaneous soft tissue swelling/edema/fluid. No discrete fluid collection to suggest a drainable soft tissue abscess. There is also diffuse myofasciitis but no definite MR findings without contrast to suggest pyomyositis. No findings suspicious for septic arthritis or osteomyelitis. IMPRESSION: 1. Diffuse and fairly marked subcutaneous soft tissue swelling/edema/fluid but no discrete drainable abscess is identified without contrast. 2. Diffuse myositis without definite findings for pyomyositis. 3. No MR findings suspicious for septic arthritis or osteomyelitis. Electronically Signed   By: Rudie MeyerP.  Gallerani M.D.   On: 11/15/2018 20:39   Mr Foot Right W Wo Contrast  Result Date: 11/15/2018 CLINICAL DATA:  Foot pain and swelling.  Diabetic. EXAM: MRI OF THE RIGHT FOREFOOT WITHOUT AND WITH CONTRAST TECHNIQUE: Multiplanar, multisequence MR imaging of the right foot was performed before and after  the administration of intravenous contrast. CONTRAST:  8 cc Gadavist COMPARISON:  Radiographs 11/14/2018 FINDINGS: Diffuse subcutaneous soft tissue swelling/edema/fluid suggesting cellulitis. No rim enhancing fluid collection to suggest a drainable soft tissue abscess. Mild diffuse myositis but no findings for pyomyositis. No findings suspicious for septic arthritis or osteomyelitis. The major tendons and ligaments appear intact. IMPRESSION: 1. Diffuse cellulitis but no soft tissue abscess. 2. Myositis without findings for pyomyositis. 3. No MR findings suspicious for septic arthritis or osteomyelitis. Electronically Signed   By: Rudie MeyerP.  Gallerani M.D.   On: 11/15/2018 20:44   Dg Foot Complete Right  Result Date: 11/14/2018 CLINICAL DATA:  Diabetic wound, right great toe EXAM: RIGHT FOOT COMPLETE - 3+ VIEW COMPARISON:  None. FINDINGS: No fracture or dislocation of the right foot. There is mild right first metatarsophalangeal arthrosis. There is extensive, diffuse soft tissue edema about the included right foot and ankle. There is no bony erosion or sclerosis to suggest osteomyelitis. IMPRESSION: No fracture or dislocation of the right foot. There is mild right first metatarsophalangeal arthrosis. There is extensive, diffuse soft tissue edema about the included right foot and ankle. There is no bony erosion or sclerosis to suggest osteomyelitis. Please note that MRI is more sensitive for the evaluation of marrow edema and osteomyelitis if suspected. Electronically Signed   By: Lauralyn PrimesAlex  Bibbey M.D.   On: 11/14/2018 21:55     CBC Recent Labs  Lab 11/14/18 2106 11/16/18 0338 11/17/18 1241  WBC 7.3 6.7 6.9  HGB 14.7 13.1 14.6  HCT 49.6 45.1 49.2  PLT 168 181 PLATELET CLUMPS NOTED ON SMEAR, UNABLE TO ESTIMATE  MCV 90.8 91.3 92.5  MCH 26.9 26.5 27.4  MCHC 29.6* 29.0* 29.7*  RDW 13.1 13.0 13.0  LYMPHSABS 1.0  --  0.7  MONOABS 0.9  --  0.6  EOSABS 0.1  --  0.2  BASOSABS 0.0  --  0.0    Chemistries   Recent Labs  Lab 11/14/18 2106 11/16/18 0338 11/17/18 1241  NA 130* 137 137  K 4.8 4.4 4.4  CL 91* 97* 94*  CO2 29 32 33*  GLUCOSE 207* 210* 164*  BUN 21 15 10   CREATININE 1.21 1.06 1.13  CALCIUM 8.7* 8.3* 8.6*  AST 28 23  --   ALT 14 14  --   ALKPHOS 65 52  --   BILITOT 0.5 0.5  --    ------------------------------------------------------------------------------------------------------------------ No results for input(s): CHOL, HDL, LDLCALC, TRIG, CHOLHDL, LDLDIRECT in the last 72 hours.  Lab Results  Component Value Date   HGBA1C 8.1 (H) 11/16/2018   ------------------------------------------------------------------------------------------------------------------ No results for input(s): TSH, T4TOTAL, T3FREE, THYROIDAB in the last 72 hours.  Invalid input(s): FREET3 ------------------------------------------------------------------------------------------------------------------ No results for input(s): VITAMINB12, FOLATE, FERRITIN, TIBC, IRON, RETICCTPCT in the last 72 hours.  Coagulation profile Recent Labs  Lab 11/14/18 2106  INR 1.1    No results for input(s): DDIMER in the last 72 hours.  Cardiac Enzymes No results for input(s): CKMB, TROPONINI, MYOGLOBIN in the last 168 hours.  Invalid input(s): CK ------------------------------------------------------------------------------------------------------------------    Component Value Date/Time   BNP 9.5 11/03/2014 1215     Marcia Lepera M.D on 11/17/2018 at 5:45 PM  Go to [email or web address] - for contact info  [facility] - Office  [phone]

## 2018-11-18 LAB — GLUCOSE, CAPILLARY
Glucose-Capillary: 140 mg/dL — ABNORMAL HIGH (ref 70–99)
Glucose-Capillary: 150 mg/dL — ABNORMAL HIGH (ref 70–99)
Glucose-Capillary: 154 mg/dL — ABNORMAL HIGH (ref 70–99)
Glucose-Capillary: 174 mg/dL — ABNORMAL HIGH (ref 70–99)
Glucose-Capillary: 132 mg/dL — ABNORMAL HIGH (ref 70–99)
Glucose-Capillary: 116 mg/dL — ABNORMAL HIGH (ref 70–99)

## 2018-11-18 LAB — CREATININE, SERUM
Creatinine, Ser: 1.12 mg/dL (ref 0.61–1.24)
GFR calc Af Amer: >60 mL/min (ref >60)
GFR calc non Af Amer: >60 mL/min (ref >60)

## 2018-11-18 LAB — MRSA PCR SCREENING: MRSA by PCR: NEGATIVE

## 2018-11-18 MED ORDER — HYDROCODONE-ACETAMINOPHEN 5-325 MG PO TABS
1.0000 | ORAL_TABLET | ORAL | Status: DC | PRN
  Administered 2018-11-18: 1 via ORAL
  Filled 2018-11-18 (×2): qty 1

## 2018-11-18 NOTE — Progress Notes (Signed)
I have been in pt room for approx 93min. Attempting to collect PCR ordered by MD. Pt orignally declined because I am "white". 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 Pt has also refused to let me wrap his feet. MD made aware.

## 2018-11-18 NOTE — Progress Notes (Signed)
Pharmacy Antibiotic Note  BARAN KUHRT is a 78 y.o. male admitted on 11/14/2018 with cellulitis with no signs of osteomyelitis.  Pharmacy has been consulted for vancomycin dosing.  Plan: Vancomycin 1250 mg IV q24 hours Levels on 7/4 F/u renal function, cultures and clinical course  Weight: 195 lb 1.7 oz (88.5 kg)  Temp (24hrs), Avg:98.6 F (37 C), Min:98.2 F (36.8 C), Max:98.9 F (37.2 C)  Recent Labs  Lab 11/14/18 2106 11/15/18 0110 11/16/18 0338 11/17/18 1241 11/18/18 0249  WBC 7.3  --  6.7 6.9  --   CREATININE 1.21  --  1.06 1.13 1.12  LATICACIDVEN 1.2 1.1  --   --   --     Estimated Creatinine Clearance: 54.5 mL/min (by C-G formula based on SCr of 1.12 mg/dL).    No Known Allergies  Steffanie Mingle A. Levada Dy, PharmD, [place] Please utilize Amion for appropriate phone number to reach the unit pharmacist ([place])   11/18/2018 2:30 PM

## 2018-11-18 NOTE — Consult Note (Signed)
[facility] Nurse wound consult note Reason for Consult: Cellulitis to bilateral lower legs and feet. Maggots present in wounds between toes on admission.  Wound type:infectious Pressure Injury POA: NA Measurement:Bilateral lower legs with scattered scabbed lesions present circumferentially.  1 cm scabbed lesions between great toes bilaterally.  No maggots present today. Wound HYI:FOYDXAJ Drainage (amount, consistency, odor)none  none today Periwound:Edema and chronic skin changes Dressing procedure/placement/frequency: Cleanse bilateral legs with soap and water and pat dry.  Iodoform packing strip between toes.  (Cut long strip and weave between toes).  Cover with kerlix and tape. Change daily.  Moisturize legs with barrier cream Will not follow at this time.  Please re-consult if needed.  Domenic Moras MSN, RN, FNP-BC CWON Wound, Ostomy, Continence Nurse Pager [phone]

## 2018-11-18 NOTE — Care Management Important Message (Signed)
Important Message  Patient Details  Name: Ivan Parks MRN: [id number] Date of Birth: Aug 29, 1940   Medicare Important Message Given:  Yes     Memory Argue 11/18/2018, 2:42 PM

## 2018-11-18 NOTE — Progress Notes (Addendum)
Patient Demographics:    Ivan Parks, is a 78 y.o. male, DOB - April 02, 1941, JXB:[id number]  Admit date - 11/14/2018   Admitting Physician John Giovanni, MD  Outpatient Primary MD for the patient is Laurann Montana, MD  LOS - 3   Chief Complaint  Patient presents with  . Wound Infection        Subjective:    Ivan Parks today has no fevers, no emesis,  No chest pain,     Assessment  & Plan :    Active Problems:   COPD, severe (HCC)   Diabetes (HCC)   Peripheral vascular disease (HCC)   Hyperlipidemia   CHF (congestive heart failure) (HCC)   Infection   Diabetic foot ulcers (HCC)  Brief Summary- Ivan Parks medical history significant ofpoorly controlled diabetes, chronic diastolic CHF, COPD, hypertension, hyperlipidemia, PVD admitted on 11/14/2020 bilateral lower extremity cellulitis and found to have Magots --- Awaiting MRSA PCR, also needs SNF rehab    A/p 1)Bilateral lower extremity diabetic ulcers with cellulitis--- had maggots in both feet on admission---  x-rays and MRI without osteomyelitis,, ABIs from 11/04/2017 showed right ankle brachial index with mild right lower extremity arterial disease, and normal on the left-----continue IV Rocephin and vancomycin, consider dc of vanco if MRSA PCR is negative. --Flagyl discontinued 11/17/2018 -Blood cultures from 11/14/18 and repeat blood culture from 11/15/18 NGTD ---- Pt has "edema Compressive device" at home from the wound Clinic , but refuses to use them......  2)DM2-A1c is 8.1, continue Levemir insulin 15 units daily, Use Novolog/Humalog Sliding scale insulin with Accu-Cheks/Fingersticks as ordered   3)HFpEF--- compensated dCHF, last known EF is 60 to 65% from 12/2017,  no acute flareup,  continue Lasix 40 mg daily, continue lisinopril 20 mg daily  4)Generalized weakness and debility--- Physical therapy eval  appreciated, they recommend SNF rehab  5)Social--- patient lives with his elderly wife, on admission he was found with Magots in his toes/foot, PT recommends SNF rehab  6)Chronic Hypoxic Resp failure--- c/n Oxygen at 2L/min, c/n Bronchiodilators  7)Noncompliance--- discussed with patient's daughter Crystal and patient's wife [name] apparently patient is noncompliant with medications, oxygen therapy and lymphedema device at home  Disposition/Need for in-Hospital Stay- patient unable to be discharged at this time due to bilateral cellulitis requiring IV antibiotics and awaiting transfer to SNF rehab  Code Status : Full  Family Communication:  (patient is alert, awake and coherent)--Discussed with wife Ms Donald Memoli--- [phone] ( --- also d/w Daughter--- Nettie Elm   Disposition Plan  : Needs SNF rehab  Consults  :  *Wound care consult DVT Prophylaxis  :  Lovenox -   Lab Results  Component Value Date   PLT PLATELET CLUMPS NOTED ON SMEAR, UNABLE TO ESTIMATE 11/17/2018    Inpatient Medications  Scheduled Meds: . enoxaparin (LOVENOX) injection  40 mg Subcutaneous Q24H  . furosemide  40 mg Oral Daily  . insulin aspart  0-15 Units Subcutaneous TID WC  . insulin aspart  0-5 Units Subcutaneous QHS  . insulin detemir  15 Units Subcutaneous Daily  . lisinopril  20 mg Oral Daily  . pantoprazole  40 mg Oral Daily  . sodium chloride flush  3 mL Intravenous Once   Continuous Infusions: .  cefTRIAXone (ROCEPHIN)  IV 2 g (11/18/18 1441)  . vancomycin 1,250 mg (11/18/18 0231)   PRN Meds:.acetaminophen **OR** acetaminophen, albuterol, HYDROcodone-acetaminophen, ondansetron **OR** ondansetron (ZOFRAN) IV    Anti-infectives (From admission, onward)   Start     Dose/Rate Route Frequency Ordered Stop   11/16/18 0300  vancomycin (VANCOCIN) 1,250 mg in sodium chloride 0.9 % 250 mL IVPB     1,250 mg 166.7 mL/hr over 90 Minutes Intravenous Every 24 hours 11/15/18 0243     11/15/18 1200   cefTRIAXone (ROCEPHIN) 1 g in sodium chloride 0.9 % 100 mL IVPB  Status:  Discontinued     1 g 200 mL/hr over 30 Minutes Intravenous Every 24 hours 11/15/18 1034 11/15/18 1109   11/15/18 1200  metroNIDAZOLE (FLAGYL) IVPB 500 mg  Status:  Discontinued     500 mg 100 mL/hr over 60 Minutes Intravenous Every 8 hours 11/15/18 1034 11/17/18 1056   11/15/18 1200  cefTRIAXone (ROCEPHIN) 2 g in sodium chloride 0.9 % 100 mL IVPB     2 g 200 mL/hr over 30 Minutes Intravenous Every 24 hours 11/15/18 1109     11/15/18 0245  vancomycin (VANCOCIN) 1,750 mg in sodium chloride 0.9 % 500 mL IVPB     1,750 mg 250 mL/hr over 120 Minutes Intravenous  Once 11/15/18 0235 11/15/18 0532   11/15/18 0230  piperacillin-tazobactam (ZOSYN) IVPB 3.375 g     3.375 g 100 mL/hr over 30 Minutes Intravenous  Once 11/15/18 0229 11/15/18 0327        Objective:   Vitals:   11/17/18 1541 11/17/18 1948 11/18/18 0316 11/18/18 0816  BP: 127/69 106/86 136/82 (!) 141/83  Pulse: (!) 101 (!) 103 92 94  Resp: 16   17  Temp: 98.3 F (36.8 C) 98.9 F (37.2 C) 98.9 F (37.2 C) 98.2 F (36.8 C)  TempSrc: Oral Oral Oral Oral  SpO2:  94% 100% 100%  Weight:        Wt Readings from Last 3 Encounters:  11/15/18 88.5 kg  10/28/18 86.6 kg  08/02/18 87.1 kg     Intake/Output Summary (Last 24 hours) at 11/18/2018 1504 Last data filed at 11/18/2018 0900 Gross per 24 hour  Intake 240 ml  Output -  Net 240 ml    Physical Exam  Gen:- Awake Alert,  In no apparent distress  HEENT:- [place].AT, No sclera icterus Nose- [place] 2 L/min Neck-Supple Neck,No JVD,.  Lungs-  CTAB , fair symmetrical air movement CV- S1, S2 normal, regular  Abd-  +ve B.Sounds, Abd Soft, No tenderness,    Extremity/Skin:-  pedal pulses present , bilateral lower extremity--- improving  erythema, warmth, tenderness and improving edema--- No streaking or purulent drainage Psych-affect is appropriate, oriented x3 Neuro-Generalized weakness but no new focal deficits,  no tremors   Data Review:   Micro Results Recent Results (from the past 240 hour(s))  Culture, blood (Routine x 2)     Status: None (Preliminary result)   Collection Time: 11/14/18  9:14 PM   Specimen: BLOOD LEFT HAND  Result Value Ref Range Status   Specimen Description BLOOD LEFT HAND  Final   Special Requests   Final    BOTTLES DRAWN AEROBIC AND ANAEROBIC Blood Culture results may not be optimal due to an inadequate volume of blood received in culture bottles   Culture   Final    NO GROWTH 4 DAYS Performed at [facility], [address] [place], [place] [address]    Report Status  PENDING  Incomplete  Culture, blood (Routine x 2)     Status: None (Preliminary result)   Collection Time: 11/15/18  1:09 AM   Specimen: BLOOD LEFT HAND  Result Value Ref Range Status   Specimen Description BLOOD LEFT HAND  Final   Special Requests   Final    BOTTLES DRAWN AEROBIC AND ANAEROBIC Blood Culture results may not be optimal due to an inadequate volume of blood received in culture bottles   Culture   Final    NO GROWTH 3 DAYS Performed at [facility], [address] [place], [place] [address]    Report Status PENDING  Incomplete  SARS Coronavirus 2 (CEPHEID - Performed in [facility] hospital lab), Hosp Order     Status: None   Collection Time: 11/15/18  1:52 AM   Specimen: Nasopharyngeal Swab  Result Value Ref Range Status   SARS Coronavirus 2 NEGATIVE NEGATIVE Final    Comment: (NOTE) If result is NEGATIVE SARS-CoV-2 target nucleic acids are NOT DETECTED. The SARS-CoV-2 RNA is generally detectable in upper and lower  respiratory specimens during the acute phase of infection. The lowest  concentration of SARS-CoV-2 viral copies this assay can detect is 250  copies / mL. A negative result does not preclude SARS-CoV-2 infection  and should not be used as the sole basis for treatment or other  patient management decisions.  A negative result may occur with  improper  specimen collection / handling, submission of specimen other  than nasopharyngeal swab, presence of viral mutation(s) within the  areas targeted by this assay, and inadequate number of viral copies  (<250 copies / mL). A negative result must be combined with clinical  observations, patient history, and epidemiological information. If result is POSITIVE SARS-CoV-2 target nucleic acids are DETECTED. The SARS-CoV-2 RNA is generally detectable in upper and lower  respiratory specimens dur ing the acute phase of infection.  Positive  results are indicative of active infection with SARS-CoV-2.  Clinical  correlation with patient history and other diagnostic information is  necessary to determine patient infection status.  Positive results do  not rule out bacterial infection or co-infection with other viruses. If result is PRESUMPTIVE POSTIVE SARS-CoV-2 nucleic acids MAY BE PRESENT.   A presumptive positive result was obtained on the submitted specimen  and confirmed on repeat testing.  While 2019 novel coronavirus  (SARS-CoV-2) nucleic acids may be present in the submitted sample  additional confirmatory testing may be necessary for epidemiological  and / or clinical management purposes  to differentiate between  SARS-CoV-2 and other Sarbecovirus currently known to infect humans.  If clinically indicated additional testing with an alternate test  methodology [phone] is advised. The SARS-CoV-2 RNA is generally  detectable in upper and lower respiratory sp ecimens during the acute  phase of infection. The expected result is Negative. Fact Sheet for Patients:  BoilerBrush.com.cyhttps://www.fda.gov/media/136312/download Fact Sheet for Healthcare Providers: https://pope.com/https://www.fda.gov/media/136313/download This test is not yet approved or cleared by the [place] FDA and has been authorized for detection and/or diagnosis of SARS-CoV-2 by FDA under an Emergency Use Authorization (EUA).  This EUA will remain in  effect (meaning this test can be used) for the duration of the COVID-19 declaration under Section 564(b)(1) of the Act, 21 U.S.C. section 360bbb-3(b)(1), unless the authorization is terminated or revoked sooner. Performed at [facility], [address] [place], [place] [address]     Radiology Reports Mr Foot Left Wo Contrast  Result Date: 11/15/2018  CLINICAL DATA:  Diffuse foot swelling.  Diabetic. EXAM: MRI OF THE LEFT FOOT WITHOUT CONTRAST TECHNIQUE: Multiplanar, multisequence MR imaging of the left foot was performed. No intravenous contrast was administered. COMPARISON:  None. FINDINGS: Diffuse and fairly marked subcutaneous soft tissue swelling/edema/fluid. No discrete fluid collection to suggest a drainable soft tissue abscess. There is also diffuse myofasciitis but no definite MR findings without contrast to suggest pyomyositis. No findings suspicious for septic arthritis or osteomyelitis. IMPRESSION: 1. Diffuse and fairly marked subcutaneous soft tissue swelling/edema/fluid but no discrete drainable abscess is identified without contrast. 2. Diffuse myositis without definite findings for pyomyositis. 3. No MR findings suspicious for septic arthritis or osteomyelitis. Electronically Signed   By: Marijo Sanes M.D.   On: 11/15/2018 20:39   Mr Foot Right W Wo Contrast  Result Date: 11/15/2018 CLINICAL DATA:  Foot pain and swelling.  Diabetic. EXAM: MRI OF THE RIGHT FOREFOOT WITHOUT AND WITH CONTRAST TECHNIQUE: Multiplanar, multisequence MR imaging of the right foot was performed before and after the administration of intravenous contrast. CONTRAST:  8 cc Gadavist COMPARISON:  Radiographs 11/14/2018 FINDINGS: Diffuse subcutaneous soft tissue swelling/edema/fluid suggesting cellulitis. No rim enhancing fluid collection to suggest a drainable soft tissue abscess. Mild diffuse myositis but no findings for pyomyositis. No findings suspicious for septic arthritis or osteomyelitis. The major  tendons and ligaments appear intact. IMPRESSION: 1. Diffuse cellulitis but no soft tissue abscess. 2. Myositis without findings for pyomyositis. 3. No MR findings suspicious for septic arthritis or osteomyelitis. Electronically Signed   By: Marijo Sanes M.D.   On: 11/15/2018 20:44   Dg Foot Complete Right  Result Date: 11/14/2018 CLINICAL DATA:  Diabetic wound, right great toe EXAM: RIGHT FOOT COMPLETE - 3+ VIEW COMPARISON:  None. FINDINGS: No fracture or dislocation of the right foot. There is mild right first metatarsophalangeal arthrosis. There is extensive, diffuse soft tissue edema about the included right foot and ankle. There is no bony erosion or sclerosis to suggest osteomyelitis. IMPRESSION: No fracture or dislocation of the right foot. There is mild right first metatarsophalangeal arthrosis. There is extensive, diffuse soft tissue edema about the included right foot and ankle. There is no bony erosion or sclerosis to suggest osteomyelitis. Please note that MRI is more sensitive for the evaluation of marrow edema and osteomyelitis if suspected. Electronically Signed   By: Eddie Candle M.D.   On: 11/14/2018 21:55     CBC Recent Labs  Lab 11/14/18 2106 11/16/18 0338 11/17/18 1241  WBC 7.3 6.7 6.9  HGB 14.7 13.1 14.6  HCT 49.6 45.1 49.2  PLT 168 181 PLATELET CLUMPS NOTED ON SMEAR, UNABLE TO ESTIMATE  MCV 90.8 91.3 92.5  MCH 26.9 26.5 27.4  MCHC 29.6* 29.0* 29.7*  RDW 13.1 13.0 13.0  LYMPHSABS 1.0  --  0.7  MONOABS 0.9  --  0.6  EOSABS 0.1  --  0.2  BASOSABS 0.0  --  0.0    Chemistries  Recent Labs  Lab 11/14/18 2106 11/16/18 0338 11/17/18 1241 11/18/18 0249  NA 130* 137 137  --   K 4.8 4.4 4.4  --   CL 91* 97* 94*  --   CO2 29 32 33*  --   GLUCOSE 207* 210* 164*  --   BUN 21 15 10   --   CREATININE 1.21 1.06 1.13 1.12  CALCIUM 8.7* 8.3* 8.6*  --   AST 28 23  --   --   ALT 14 14  --   --  ALKPHOS 65 52  --   --   BILITOT 0.5 0.5  --   --     ------------------------------------------------------------------------------------------------------------------ No results for input(s): CHOL, HDL, LDLCALC, TRIG, CHOLHDL, LDLDIRECT in the last 72 hours.  Lab Results  Component Value Date   HGBA1C 8.1 (H) 11/16/2018   ------------------------------------------------------------------------------------------------------------------ No results for input(s): TSH, T4TOTAL, T3FREE, THYROIDAB in the last 72 hours.  Invalid input(s): FREET3 ------------------------------------------------------------------------------------------------------------------ No results for input(s): VITAMINB12, FOLATE, FERRITIN, TIBC, IRON, RETICCTPCT in the last 72 hours.  Coagulation profile Recent Labs  Lab 11/14/18 2106  INR 1.1    No results for input(s): DDIMER in the last 72 hours.  Cardiac Enzymes No results for input(s): CKMB, TROPONINI, MYOGLOBIN in the last 168 hours.  Invalid input(s): CK ------------------------------------------------------------------------------------------------------------------    Component Value Date/Time   BNP 9.5 11/03/2014 1215     Jmari Pelc M.D on 11/18/2018 at 3:04 PM  Go to [email or web address] - for contact info  [facility] - Office  [phone]

## 2018-11-18 NOTE — Evaluation (Signed)
Physical Therapy Evaluation Patient Details Name: Ivan Parks MRN: [id number] DOB: 1940/11/17 Today's Date: 11/18/2018   History of Present Illness  78 yo male with B foot maggots and cellulitis/myositis was admitted and cleared for joint sepsis and osteomyelitis.  PT for mobility assessment.  PMHx:  DM, CHF, COPD, HTN, PVD, O2 at home 2.5 L  Clinical Impression  Pt was admitted after discovery of B foot and lower leg cellulitis and myositis, but cleared for osteomyelitis.  He is able to walk with minor help but would not walk far, tends to remove O2 which leaves him at 83% O2 sats at rest, and with O2 is 95% to walk a few feet.  Will need to be able to ascend stairs for home, refusing over knee pain and fatigue.  Can potentially upgrade his instructions for DC if acutely he can walk on stairs but do not expect this to happen prior to his discharge.  Update discharge plan as needed.    Follow Up Recommendations SNF    Equipment Recommendations  None recommended by PT    Recommendations for Other Services       Precautions / Restrictions Precautions Precautions: Fall Precaution Comments: pain in BLE's Restrictions Weight Bearing Restrictions: Yes Other Position/Activity Restrictions: WBAT      Mobility  Bed Mobility               General bed mobility comments: up when PT arrived  Transfers Overall transfer level: Needs assistance Equipment used: Rolling walker (2 wheeled);1 person hand held assist Transfers: Sit to/from Stand Sit to Stand: Min guard;From elevated surface         General transfer comment:  pt does not exert well if he is not on an elevated surface, otherwise is min guard  Ambulation/Gait Ambulation/Gait assistance: Min guard Gait Distance (Feet): 6 Feet Assistive device: Rolling walker (2 wheeled);1 person hand held assist Gait Pattern/deviations: Wide base of support;Decreased stride length;Step-through pattern;Decreased step length -  right;Decreased step length - left Gait velocity: reduced Gait velocity interpretation: <1.31 ft/sec, indicative of household ambulator General Gait Details: reluctant effort, asks PT to not assist him  Stairs Stairs: (declined by pt)          Wheelchair Mobility    Modified Rankin (Stroke Patients Only)       Balance Overall balance assessment: Needs assistance Sitting-balance support: Feet supported Sitting balance-Leahy Scale: Good Sitting balance - Comments: controlling side of bed when PT arrived Postural control: Posterior lean Standing balance support: Bilateral upper extremity supported;During functional activity Standing balance-Leahy Scale: Fair Standing balance comment: using walker for pain management of knees and L calf                             Pertinent Vitals/Pain Pain Assessment: 0-10 Pain Score: 8  Pain Location: L lateral calf Pain Descriptors / Indicators: Burning;Sharp Pain Intervention(s): Limited activity within patient's tolerance;Monitored during session;Repositioned    Home Living Family/patient expects to be discharged to:: Private residence Living Arrangements: Spouse/significant other Available Help at Discharge: Family;Available 24 hours/day Type of Home: House Home Access: Stairs to enter Entrance Stairs-Rails: Right;Left;Can reach both Entrance Stairs-Number of Steps: 7 Home Layout: One level Home Equipment: Walker - 2 wheels;Cane - single point;Shower seat Additional Comments: pt is home with wife who may not be able to assist him with LE wounds and his mobility needs    Prior Function Level of Independence: Independent with assistive device(s)  Comments: pt is in agreement to try to walk with MD encouraging but refused stairs and would only walk near the bed     Hand Dominance        Extremity/Trunk Assessment   Upper Extremity Assessment Upper Extremity Assessment: Overall WFL for tasks assessed     Lower Extremity Assessment Lower Extremity Assessment: Generalized weakness    Cervical / Trunk Assessment Cervical / Trunk Assessment: Kyphotic  Communication   Communication: No difficulties  Cognition Arousal/Alertness: Awake/alert Behavior During Therapy: Agitated Overall Cognitive Status: No family/caregiver present to determine baseline cognitive functioning                                 General Comments: unsure if the pt is confused about moving pre-existing or just current issue      General Comments General comments (skin integrity, edema, etc.): Pt has purple and discolored lower legs, pain and struggling to agree to elevate them at nursing request    Exercises     Assessment/Plan    PT Assessment Patient needs continued PT services  PT Problem List Decreased strength;Decreased range of motion;Decreased activity tolerance;Decreased balance;Decreased mobility;Decreased coordination;Decreased cognition;Decreased knowledge of use of DME;Decreased safety awareness;Cardiopulmonary status limiting activity;Obesity;Decreased skin integrity;Pain       PT Treatment Interventions DME instruction;Gait training;Stair training;Functional mobility training;Therapeutic activities;Therapeutic exercise;Balance training;Neuromuscular re-education;Patient/family education    PT Goals (Current goals can be found in the Care Plan section)  Acute Rehab PT Goals Patient Stated Goal: to walk when he is ready PT Goal Formulation: With patient Time For Goal Achievement: 12/02/18 Potential to Achieve Goals: Good    Frequency Min 2X/week   Barriers to discharge Inaccessible home environment;Decreased caregiver support home with 7 stairs to enter house with elderly wife    Co-evaluation               AM-PAC PT "6 Clicks" Mobility  Outcome Measure Help needed turning from your back to your side while in a flat bed without using bedrails?: None Help needed moving  from lying on your back to sitting on the side of a flat bed without using bedrails?: A Little Help needed moving to and from a bed to a chair (including a wheelchair)?: A Little Help needed standing up from a chair using your arms (e.g., wheelchair or bedside chair)?: A Little Help needed to walk in hospital room?: A Little Help needed climbing 3-5 steps with a railing? : A Lot 6 Click Score: 18    End of Session Equipment Utilized During Treatment: Gait belt;Oxygen Activity Tolerance: Patient limited by fatigue;Treatment limited secondary to medical complications (Comment) Patient left: in bed;with call bell/phone within reach Nurse Communication: Mobility status;Patient requests pain meds;Other (comment)(pt is avoiding walking and declined stairs) PT Visit Diagnosis: Unsteadiness on feet (R26.81);Muscle weakness (generalized) (M62.81);Difficulty in walking, not elsewhere classified (R26.2);Pain Pain - Right/Left: Left Pain - part of body: Leg    Time: 1020-1106 PT Time Calculation (min) (ACUTE ONLY): 46 min   Charges:   PT Evaluation $PT Eval Moderate Complexity: 1 Mod PT Treatments $Gait Training: 8-22 mins $Therapeutic Activity: 8-22 mins       Ramond Dial 11/18/2018, 1:12 PM  Mee Hives, PT MS Acute Rehab Dept. Number: [place] and [place]

## 2018-11-19 LAB — GLUCOSE, CAPILLARY
Glucose-Capillary: 67 mg/dL — ABNORMAL LOW (ref 70–99)
Glucose-Capillary: 97 mg/dL (ref 70–99)
Glucose-Capillary: 123 mg/dL — ABNORMAL HIGH (ref 70–99)
Glucose-Capillary: 108 mg/dL — ABNORMAL HIGH (ref 70–99)
Glucose-Capillary: 76 mg/dL (ref 70–99)
Glucose-Capillary: 83 mg/dL (ref 70–99)
Glucose-Capillary: 131 mg/dL — ABNORMAL HIGH (ref 70–99)
Glucose-Capillary: 161 mg/dL — ABNORMAL HIGH (ref 70–99)

## 2018-11-19 LAB — CBC
HCT: 47.7 % (ref 39.0–52.0)
Hemoglobin: 13.8 g/dL (ref 13.0–17.0)
MCH: 26.8 pg (ref 26.0–34.0)
MCHC: 28.9 g/dL — ABNORMAL LOW (ref 30.0–36.0)
MCV: 92.6 fL (ref 80.0–100.0)
Platelets: 213 10*3/uL (ref 150–400)
RBC: 5.15 MIL/uL (ref 4.22–5.81)
RDW: 12.9 % (ref 11.5–15.5)
WBC: 6.1 10*3/uL (ref 4.0–10.5)
nRBC: 0 % (ref 0.0–0.2)

## 2018-11-19 LAB — COMPREHENSIVE METABOLIC PANEL
ALT: 18 U/L (ref 0–44)
AST: 30 U/L (ref 15–41)
Albumin: 2.8 g/dL — ABNORMAL LOW (ref 3.5–5.0)
Alkaline Phosphatase: 50 U/L (ref 38–126)
Anion gap: 13 (ref 5–15)
BUN: 19 mg/dL (ref 8–23)
CO2: 28 mmol/L (ref 22–32)
Calcium: 8.5 mg/dL — ABNORMAL LOW (ref 8.9–10.3)
Chloride: 94 mmol/L — ABNORMAL LOW (ref 98–111)
Creatinine, Ser: 1.45 mg/dL — ABNORMAL HIGH (ref 0.61–1.24)
GFR calc Af Amer: 53 mL/min — ABNORMAL LOW (ref >60)
GFR calc non Af Amer: 46 mL/min — ABNORMAL LOW (ref >60)
Glucose, Bld: 69 mg/dL — ABNORMAL LOW (ref 70–99)
Potassium: 4.5 mmol/L (ref 3.5–5.1)
Sodium: 135 mmol/L (ref 135–145)
Total Bilirubin: 0.7 mg/dL (ref 0.3–1.2)
Total Protein: 6.9 g/dL (ref 6.5–8.1)

## 2018-11-19 LAB — CULTURE, BLOOD (ROUTINE X 2): Culture: NO GROWTH

## 2018-11-19 LAB — VANCOMYCIN, PEAK: Vancomycin Pk: 33 ug/mL (ref 30–40)

## 2018-11-19 MED ORDER — ATORVASTATIN CALCIUM 10 MG PO TABS
20.0000 mg | ORAL_TABLET | Freq: Every day | ORAL | Status: DC
  Administered 2018-11-20 – 2018-11-22 (×3): 20 mg via ORAL
  Filled 2018-11-19 (×4): qty 2

## 2018-11-19 MED ORDER — INSULIN GLARGINE 100 UNIT/ML ~~LOC~~ SOLN
10.0000 [IU] | Freq: Every day | SUBCUTANEOUS | Status: DC
  Administered 2018-11-20 – 2018-11-22 (×3): 10 [IU] via SUBCUTANEOUS
  Filled 2018-11-19 (×3): qty 0.1

## 2018-11-19 MED ORDER — SODIUM CHLORIDE 0.9 % IV SOLN
2.0000 g | Freq: Two times a day (BID) | INTRAVENOUS | Status: DC
  Administered 2018-11-19 – 2018-11-22 (×6): 2 g via INTRAVENOUS
  Filled 2018-11-19 (×9): qty 2

## 2018-11-19 MED ORDER — DEXTROSE 50 % IV SOLN
INTRAVENOUS | Status: AC
  Filled 2018-11-19: qty 50

## 2018-11-19 NOTE — Plan of Care (Signed)
  Problem: Safety: Goal: Ability to remain free from injury will improve Outcome: Progressing   Problem: Skin Integrity: Goal: Risk for impaired skin integrity will decrease Outcome: Progressing   

## 2018-11-19 NOTE — Progress Notes (Signed)
Pt is up eating and A&O x4. Ambulated to bathroom. Will continue to monitor pt.

## 2018-11-19 NOTE — Progress Notes (Signed)
Pharmacy Antibiotic Note  Ivan Parks is a 78 y.o. male admitted on 11/14/2018 with bilateral lower extremity diabetic ulcers with cellulitis. MRI negative for osteomyelitis. Patient has been on Vancomycin and Ceftriaxone (Metronidazole d/c'ed 7/2). Ceftriaxone switched to Ceftazidime per MD today. Pharmacy has been consulted for antibiotic dosing.  Today, 11/19/18:  SCr 1.12 > 1.45 today  WBC WNL  Afebrile  Vancomycin peak = 33 mcg/mL  Plan: Continue Vancomycin 1250mg  IV q24h for now Check Vancomycin trough level in AM to assess AUC Ceftazidime 2g IV q12h Monitor renal function, cultures, clinical course  Height: 5\' 2"  (157.5 cm) Weight: 195 lb 1.7 oz (88.5 kg) IBW/kg (Calculated) : 54.6  Temp (24hrs), Avg:98.5 F (36.9 C), Min:98.1 F (36.7 C), Max:98.8 F (37.1 C)  Recent Labs  Lab 11/14/18 2106 11/15/18 0110 11/16/18 0338 11/17/18 1241 11/18/18 0249 11/19/18 0650  WBC 7.3  --  6.7 6.9  --  6.1  CREATININE 1.21  --  1.06 1.13 1.12 1.45*  LATICACIDVEN 1.2 1.1  --   --   --   --   VANCOPEAK  --   --   --   --   --  33    Estimated Creatinine Clearance: 40.5 mL/min (A) (by C-G formula based on SCr of 1.45 mg/dL (H)).    No Known Allergies   Lindell Spar, PharmD, BCPS Clinical Pharmacist Please utilize Amion for appropriate phone number to reach the unit pharmacist ([place]) 11/19/2018 11:20 AM

## 2018-11-19 NOTE — Progress Notes (Signed)
PROGRESS NOTE  Ivan MinisterLarry L Ratz ZOX:[id number] DOB: 11-01-1940 DOA: 11/14/2018 PCP: Laurann MontanaWhite, [name], MD   LOS: 4 days   Patient is from: Home  Brief Narrative / Interim history: 78 year old male with history of poorly controlled DM-2, diastolic CHF, COPD, HTN, HLD, PVD and chronic venous stasis reportedly followed by wound care admitted on 11/15/18 with bilateral lower extremity cellulitis and Magots.   No constitutional symptoms or signs of systemic infection. Started on vancomycin, Flagyl and ceftriaxone.  X-ray and MRI without osteomyelitis.  ABI with mild PVD of right LE.   See individual problem list below for more.  Subjective: Main complaint was about the food.  He said they are serving the same for day after day.  He also complains why he was screened for COVID-19.  He is somewhat a poor historian.  Denies chest pain or dyspnea.  Denies significant pain in his legs.   Objective: Vitals:   11/18/18 1827 11/18/18 1920 11/19/18 0500 11/19/18 0828  BP:  100/67 (!) 145/69 (!) 141/75  Pulse:  87 (!) 101 100  Resp:      Temp:  98.1 F (36.7 C) 98.8 F (37.1 C) 98.6 F (37 C)  TempSrc:  Oral Oral Oral  SpO2:  93% 94% 99%  Weight:      Height: 5\' 2"  (1.575 m)       Intake/Output Summary (Last 24 hours) at 11/19/2018 1404 Last data filed at 11/19/2018 1036 Gross per 24 hour  Intake 1110.7 ml  Output -  Net 1110.7 ml   Filed Weights   11/15/18 0615  Weight: 88.5 kg    Examination:  GENERAL: No acute distress.  Appears well.  Sitting on the edge of the bed. HEENT: MMM.  Vision and hearing grossly intact.  NECK: Supple.  No JVD.  LUNGS:  No IWOB. Good air movement bilaterally. HEART:  RRR. Heart sounds normal.  ABD: Bowel sounds present. Soft. Non tender.  MSK/EXT:  Moves extremities.  Dressing over both lower extremities appears clean and dry. SKIN: No apparent skin lesion proximally.  NEURO: Awake, alert and oriented appropriately.  No gross deficit.   I have personally  reviewed the following labs and images:  Radiology Studies: No results found.  Microbiology: Recent Results (from the past 240 hour(s))  Culture, blood (Routine x 2)     Status: None (Preliminary result)   Collection Time: 11/14/18  9:14 PM   Specimen: BLOOD LEFT HAND  Result Value Ref Range Status   Specimen Description BLOOD LEFT HAND  Final   Special Requests   Final    BOTTLES DRAWN AEROBIC AND ANAEROBIC Blood Culture results may not be optimal due to an inadequate volume of blood received in culture bottles   Culture   Final    NO GROWTH 4 DAYS Performed at [facility], [address] [place], [place] [address]    Report Status PENDING  Incomplete  Culture, blood (Routine x 2)     Status: None (Preliminary result)   Collection Time: 11/15/18  1:09 AM   Specimen: BLOOD LEFT HAND  Result Value Ref Range Status   Specimen Description BLOOD LEFT HAND  Final   Special Requests   Final    BOTTLES DRAWN AEROBIC AND ANAEROBIC Blood Culture results may not be optimal due to an inadequate volume of blood received in culture bottles   Culture   Final    NO GROWTH 3 DAYS Performed at [facility], [address]  [place], [place] [address]    Report Status PENDING  Incomplete  SARS Coronavirus 2 (CEPHEID - Performed in [facility] hospital lab), Hosp Order     Status: None   Collection Time: 11/15/18  1:52 AM   Specimen: Nasopharyngeal Swab  Result Value Ref Range Status   SARS Coronavirus 2 NEGATIVE NEGATIVE Final    Comment: (NOTE) If result is NEGATIVE SARS-CoV-2 target nucleic acids are NOT DETECTED. The SARS-CoV-2 RNA is generally detectable in upper and lower  respiratory specimens during the acute phase of infection. The lowest  concentration of SARS-CoV-2 viral copies this assay can detect is 250  copies / mL. A negative result does not preclude SARS-CoV-2 infection  and should not be used as the sole basis for treatment or other  patient management  decisions.  A negative result may occur with  improper specimen collection / handling, submission of specimen other  than nasopharyngeal swab, presence of viral mutation(s) within the  areas targeted by this assay, and inadequate number of viral copies  (<250 copies / mL). A negative result must be combined with clinical  observations, patient history, and epidemiological information. If result is POSITIVE SARS-CoV-2 target nucleic acids are DETECTED. The SARS-CoV-2 RNA is generally detectable in upper and lower  respiratory specimens dur ing the acute phase of infection.  Positive  results are indicative of active infection with SARS-CoV-2.  Clinical  correlation with patient history and other diagnostic information is  necessary to determine patient infection status.  Positive results do  not rule out bacterial infection or co-infection with other viruses. If result is PRESUMPTIVE POSTIVE SARS-CoV-2 nucleic acids MAY BE PRESENT.   A presumptive positive result was obtained on the submitted specimen  and confirmed on repeat testing.  While 2019 novel coronavirus  (SARS-CoV-2) nucleic acids may be present in the submitted sample  additional confirmatory testing may be necessary for epidemiological  and / or clinical management purposes  to differentiate between  SARS-CoV-2 and other Sarbecovirus currently known to infect humans.  If clinically indicated additional testing with an alternate test  methodology [phone] is advised. The SARS-CoV-2 RNA is generally  detectable in upper and lower respiratory sp ecimens during the acute  phase of infection. The expected result is Negative. Fact Sheet for Patients:  BoilerBrush.com.cyhttps://www.fda.gov/media/136312/download Fact Sheet for Healthcare Providers: https://pope.com/https://www.fda.gov/media/136313/download This test is not yet approved or cleared by the [place] FDA and has been authorized for detection and/or diagnosis of SARS-CoV-2 by FDA under an  Emergency Use Authorization (EUA).  This EUA will remain in effect (meaning this test can be used) for the duration of the COVID-19 declaration under Section 564(b)(1) of the Act, 21 U.S.C. section 360bbb-3(b)(1), unless the authorization is terminated or revoked sooner. Performed at [facility], [address] [place], [place] [address]   MRSA PCR Screening     Status: None   Collection Time: 11/18/18  8:11 PM   Specimen: Nasal Mucosa; Nasopharyngeal  Result Value Ref Range Status   MRSA by PCR NEGATIVE NEGATIVE Final    Comment:        The GeneXpert MRSA Assay (FDA approved for NASAL specimens only), is one component of a comprehensive MRSA colonization surveillance program. It is not intended to diagnose MRSA infection nor to guide or monitor treatment for MRSA infections. Performed at [facility], [address] [place], [place] [address]     Sepsis Labs: Invalid input(s): PROCALCITONIN, LACTICIDVEN  Urine analysis:    Component  Value Date/Time   COLORURINE COLORLESS (A) 10/16/2016 1632   APPEARANCEUR CLEAR 10/16/2016 1632   LABSPEC 1.004 (L) 10/16/2016 1632   PHURINE 7.0 10/16/2016 1632   GLUCOSEU NEGATIVE 10/16/2016 1632   HGBUR NEGATIVE 10/16/2016 1632   BILIRUBINUR NEGATIVE 10/16/2016 1632   KETONESUR NEGATIVE 10/16/2016 1632   PROTEINUR NEGATIVE 10/16/2016 1632   UROBILINOGEN 0.2 09/19/2011 1701   NITRITE NEGATIVE 10/16/2016 1632   LEUKOCYTESUR NEGATIVE 10/16/2016 1632    Anemia Panel: No results for input(s): VITAMINB12, FOLATE, FERRITIN, TIBC, IRON, RETICCTPCT in the last 72 hours.  Thyroid Function Tests: No results for input(s): TSH, T4TOTAL, FREET4, T3FREE, THYROIDAB in the last 72 hours.  Lipid Profile: No results for input(s): CHOL, HDL, LDLCALC, TRIG, CHOLHDL, LDLDIRECT in the last 72 hours.  CBG: Recent Labs  Lab 11/18/18 2133 11/19/18 0827 11/19/18 0903 11/19/18 1057 11/19/18 1151  GLUCAP 116* 67* 97 123* 108*     HbA1C: No results for input(s): HGBA1C in the last 72 hours.  BNP (last 3 results): Recent Labs    12/02/17 1453  PROBNP 22    Cardiac Enzymes: No results for input(s): CKTOTAL, CKMB, CKMBINDEX, TROPONINI in the last 168 hours.  Coagulation Profile: Recent Labs  Lab 11/14/18 2106  INR 1.1    Liver Function Tests: Recent Labs  Lab 11/14/18 2106 11/16/18 0338 11/19/18 0650  AST 28 23 30   ALT 14 14 18   ALKPHOS 65 52 50  BILITOT 0.5 0.5 0.7  PROT 7.3 6.2* 6.9  ALBUMIN 2.8* 2.4* 2.8*   No results for input(s): LIPASE, AMYLASE in the last 168 hours. No results for input(s): AMMONIA in the last 168 hours.  Basic Metabolic Panel: Recent Labs  Lab 11/14/18 2106 11/16/18 0338 11/17/18 1241 11/18/18 0249 11/19/18 0650  NA 130* 137 137  --  135  K 4.8 4.4 4.4  --  4.5  CL 91* 97* 94*  --  94*  CO2 29 32 33*  --  28  GLUCOSE 207* 210* 164*  --  69*  BUN 21 15 10   --  19  CREATININE 1.21 1.06 1.13 1.12 1.45*  CALCIUM 8.7* 8.3* 8.6*  --  8.5*   GFR: Estimated Creatinine Clearance: 40.5 mL/min (A) (by C-G formula based on SCr of 1.45 mg/dL (H)).  CBC: Recent Labs  Lab 11/14/18 2106 11/16/18 0338 11/17/18 1241 11/19/18 0650  WBC 7.3 6.7 6.9 6.1  NEUTROABS 5.3  --  5.4  --   HGB 14.7 13.1 14.6 13.8  HCT 49.6 45.1 49.2 47.7  MCV 90.8 91.3 92.5 92.6  PLT 168 181 PLATELET CLUMPS NOTED ON SMEAR, UNABLE TO ESTIMATE 213    Procedures:  None  Microbiology: COVID-19 negative Blood cultures negative MRSA PCR negative  Assessment & Plan: Bilateral lower extremity ulcers with cellulitis Lymphedema/chronic venous stasis -Had a mild growth in both feet on admission -Osteomyelitis excluded by x-ray and MRI -ABI with mild right lower extremity PVD -Blood cultures, MRSA PCR negative -Flagyl 6/30-7/2 -Vancomycin and ceftriaxone 6/30-7/4 -Ceftazidime 7/4-- -wound care consult  Suboptimally controlled IDDM-2 with hyper and hypoglycemia: A1c 8.1%.  BG down to 69  earlier this morning but improved. -Change Levemir to Lantus -Continue SSI-moderate -Continue CBG monitoring -Start statin atorvastatin  AKI: Serum creatinine up to 1.45.  BUN 19.  He is on Lasix, lisinopril and vancomycin contribute -Discontinue lisinopril and vancomycin -Continue monitoring Lantus  Chronic diastolic CHF: EF 60 to 65% in 1/61098/2019.  Appears euvolemic. -Continue Lasix -Daily weight, intake output and renal functions  Chronic COPD: On 2 L by  at baseline  Generalized weakness/debility -PT recommended SNF -Order OT eval and treatment  Social: Patient lives with his elderly wife and found to have Magod's in his toes/foot -PT recommended SNF -Consult CSW  History of noncompliance: Reportedly noncompliant with medications, oxygen and lymphedema device at home per discussion between previous provider and family. -Would benefit from SNF where he can get close supervision.  DVT prophylaxis: Subcu Lovenox Code Status: Full code Family Communication: Attempted to call patient's wife and stepdaughter but no answer.  Phone number for other daughter, Donella Stade seems a wrong number.  Disposition Plan: Remains inpatient pending SNF placement Consultants: None standard  Antimicrobials: Anti-infectives (From admission, onward)   Start     Dose/Rate Route Frequency Ordered Stop   11/19/18 1200  cefTAZidime (FORTAZ) 2 g in sodium chloride 0.9 % 100 mL IVPB     2 g 200 mL/hr over 30 Minutes Intravenous Every 12 hours 11/19/18 1113     11/16/18 0300  vancomycin (VANCOCIN) 1,250 mg in sodium chloride 0.9 % 250 mL IVPB     1,250 mg 166.7 mL/hr over 90 Minutes Intravenous Every 24 hours 11/15/18 0243     11/15/18 1200  cefTRIAXone (ROCEPHIN) 1 g in sodium chloride 0.9 % 100 mL IVPB  Status:  Discontinued     1 g 200 mL/hr over 30 Minutes Intravenous Every 24 hours 11/15/18 1034 11/15/18 1109   11/15/18 1200  metroNIDAZOLE (FLAGYL) IVPB 500 mg  Status:  Discontinued     500 mg  100 mL/hr over 60 Minutes Intravenous Every 8 hours 11/15/18 1034 11/17/18 1056   11/15/18 1200  cefTRIAXone (ROCEPHIN) 2 g in sodium chloride 0.9 % 100 mL IVPB  Status:  Discontinued     2 g 200 mL/hr over 30 Minutes Intravenous Every 24 hours 11/15/18 1109 11/19/18 1059   11/15/18 0245  vancomycin (VANCOCIN) 1,750 mg in sodium chloride 0.9 % 500 mL IVPB     1,750 mg 250 mL/hr over 120 Minutes Intravenous  Once 11/15/18 0235 11/15/18 0532   11/15/18 0230  piperacillin-tazobactam (ZOSYN) IVPB 3.375 g     3.375 g 100 mL/hr over 30 Minutes Intravenous  Once 11/15/18 0229 11/15/18 0327      Sch Meds:  Scheduled Meds: . enoxaparin (LOVENOX) injection  40 mg Subcutaneous Q24H  . furosemide  40 mg Oral Daily  . insulin aspart  0-15 Units Subcutaneous TID WC  . insulin aspart  0-5 Units Subcutaneous QHS  . insulin detemir  15 Units Subcutaneous Daily  . lisinopril  20 mg Oral Daily  . pantoprazole  40 mg Oral Daily  . sodium chloride flush  3 mL Intravenous Once   Continuous Infusions: . cefTAZidime (FORTAZ)  IV    . vancomycin 1,250 mg (11/19/18 0346)   PRN Meds:.acetaminophen **OR** acetaminophen, albuterol, HYDROcodone-acetaminophen, ondansetron **OR** ondansetron (ZOFRAN) IV   Lil Lepage T. [place]  If 7PM-7AM, please contact night-coverage [email or web address] Password [email or web address] 11/19/2018, 2:04 PM

## 2018-11-19 NOTE — Progress Notes (Signed)
Late entry Paged by patient's nurse stating that patient was noncompliant wandering in the hallway and refusing care and threatening staff.  Also concern about patient's fluctuating mental status " patient is in and out like he is falling asleep".  RN called security.  When I arrived, patient was sitting on a chair in the hallway eating ice cream with another RN sitting by his side and talking to him.  Appears calm.  No focal neuro symptoms.   I think patient could have delirium.  He could also be dehydrated as noted by AKI.  I recommended frequent reorientation and delirium precaution and avoiding confrontation if possible.  Patient has not received any sedating medication since his last dose of Norco about 24 hours ago.  I also recommended good hydration.  I have already discontinued his Norco, lisinopril and vancomycin.  I will also discontinue his Lasix.

## 2018-11-19 NOTE — Progress Notes (Signed)
Pt wandered out of room and CN was with pt. Pt is A&O x4. This RN and CN asked pt to go back in room. Pt is being non complaint asked to wheel pt up front of the department and he declined. Pt is seems to be in and out of confusion. Security called after pt threat staff and stated he was going to hurt the staff. MD aware of situation and that pt is refusing meds and care. MD stated not to force VS and pt meds. MD aware pt confused and in and out of converstion that does not make sense. MD stated that was baseline for pt. Phone call to all numbers in pt chart no answer. MD aware.

## 2018-11-19 NOTE — Progress Notes (Signed)
Pt's cbg after receiving ice cream 76. Pt was refusing cbg to be checked during episode in hallway and refused to drink OJ as suspected low glucose. Pt ate chocolate ice cream and after eating ice cream was back at baseline. Pt's cbg was rechecked again at 1542 and was 83. Pt seems to be back at baseline will continue to monitor pt.

## 2018-11-20 DIAGNOSIS — N179 Acute kidney failure, unspecified: Secondary | ICD-10-CM

## 2018-11-20 DIAGNOSIS — Z9119 Patient's noncompliance with other medical treatment and regimen: Secondary | ICD-10-CM

## 2018-11-20 DIAGNOSIS — R4182 Altered mental status, unspecified: Secondary | ICD-10-CM

## 2018-11-20 LAB — BASIC METABOLIC PANEL
Anion gap: 8 (ref 5–15)
BUN: 18 mg/dL (ref 8–23)
CO2: 33 mmol/L — ABNORMAL HIGH (ref 22–32)
Calcium: 8.2 mg/dL — ABNORMAL LOW (ref 8.9–10.3)
Chloride: 95 mmol/L — ABNORMAL LOW (ref 98–111)
Creatinine, Ser: 1.38 mg/dL — ABNORMAL HIGH (ref 0.61–1.24)
GFR calc Af Amer: 56 mL/min — ABNORMAL LOW (ref >60)
GFR calc non Af Amer: 49 mL/min — ABNORMAL LOW (ref >60)
Glucose, Bld: 196 mg/dL — ABNORMAL HIGH (ref 70–99)
Potassium: 4.5 mmol/L (ref 3.5–5.1)
Sodium: 136 mmol/L (ref 135–145)

## 2018-11-20 LAB — CBC
HCT: 45.4 % (ref 39.0–52.0)
Hemoglobin: 13.1 g/dL (ref 13.0–17.0)
MCH: 26.8 pg (ref 26.0–34.0)
MCHC: 28.9 g/dL — ABNORMAL LOW (ref 30.0–36.0)
MCV: 93 fL (ref 80.0–100.0)
Platelets: 184 10*3/uL (ref 150–400)
RBC: 4.88 MIL/uL (ref 4.22–5.81)
RDW: 12.9 % (ref 11.5–15.5)
WBC: 5.9 10*3/uL (ref 4.0–10.5)
nRBC: 0 % (ref 0.0–0.2)

## 2018-11-20 LAB — MAGNESIUM: Magnesium: 1.7 mg/dL (ref 1.7–2.4)

## 2018-11-20 LAB — CULTURE, BLOOD (ROUTINE X 2): Culture: NO GROWTH

## 2018-11-20 LAB — GLUCOSE, CAPILLARY
Glucose-Capillary: 194 mg/dL — ABNORMAL HIGH (ref 70–99)
Glucose-Capillary: 149 mg/dL — ABNORMAL HIGH (ref 70–99)
Glucose-Capillary: 151 mg/dL — ABNORMAL HIGH (ref 70–99)
Glucose-Capillary: 167 mg/dL — ABNORMAL HIGH (ref 70–99)

## 2018-11-20 MED ORDER — LABETALOL HCL 5 MG/ML IV SOLN: 5.0000 mg | INTRAVENOUS | Status: DC | PRN

## 2018-11-20 MED ORDER — CARVEDILOL 6.25 MG PO TABS
6.2500 mg | ORAL_TABLET | Freq: Two times a day (BID) | ORAL | Status: DC
  Administered 2018-11-20 – 2018-11-21 (×2): 6.25 mg via ORAL
  Filled 2018-11-20 (×4): qty 1

## 2018-11-20 NOTE — Clinical Social Work Note (Signed)
CSW attempted to acknowledge case consult in regards to a SNF. CSW met with patient bedside and he informed CSW that he was not feeling well and would not discuss the SNF option. Patient gave CSW permission to speak wife. Patient wife did not answer.  CSW will continue to follow up for discharge planning. 

## 2018-11-20 NOTE — Progress Notes (Signed)
Occupational Therapy Evaluation Patient Details Name: Ivan MinisterLarry L Parks MRN: [id number] DOB: 1941-04-03 Today's Date: 11/20/2018    History of Present Illness 78 yo male with B foot maggots and cellulitis/myositis was admitted and cleared for joint sepsis and osteomyelitis.  PT for mobility assessment.  PMHx:  DM, CHF, COPD, HTN, PVD, O2 at home 2.5 L   Clinical Impression   Pt seen for above diagnosis. PTA pt PLOF mod I with use of AE per note. Unable to retrieve current level of function due to pt's refusal to engage throughout session, even with max encouragement. Pt observed to be very forgetful and uncooperative. RN reports pt has been using the restroom and performing ADLs independently. OT will sign off at this time. Thank you for the referral.     Follow Up Recommendations  No OT follow up    Equipment Recommendations       Recommendations for Other Services       Precautions / Restrictions Precautions Precautions: Fall Precaution Comments: pain in BLE's Restrictions Weight Bearing Restrictions: Yes Other Position/Activity Restrictions: WBAT      Mobility Bed Mobility               General bed mobility comments: pt received in chair upon arrival.   Transfers                 General transfer comment: deferred due to rufusal with max encouragement.     Balance Overall balance assessment: Needs assistance         Standing balance support: Bilateral upper extremity supported;During functional activity Standing balance-Leahy Scale: Fair                             ADL either performed or assessed with clinical judgement   ADL                                         General ADL Comments: deferred due to patient refusing even with max encouragement.      Vision         Perception     Praxis      Pertinent Vitals/Pain Pain Assessment: No/denies pain Pain Location: L lateral calf Pain Descriptors / Indicators:  Burning;Sharp     Hand Dominance     Extremity/Trunk Assessment Upper Extremity Assessment Upper Extremity Assessment: Difficult to assess due to impaired cognition   Lower Extremity Assessment Lower Extremity Assessment: Defer to PT evaluation   Cervical / Trunk Assessment Cervical / Trunk Assessment: Kyphotic   Communication Communication Communication: No difficulties   Cognition Arousal/Alertness: Awake/alert Behavior During Therapy: Agitated Overall Cognitive Status: No family/caregiver present to determine baseline cognitive functioning                                 General Comments: unsure if the pt is confused about moving pre-existing or just current issue   General Comments       Exercises Exercises: (generally 4+ strength but cannot tolerate any pressure on L )   Shoulder Instructions      Home Living Family/patient expects to be discharged to:: Private residence Living Arrangements: Spouse/significant other Available Help at Discharge: Family;Available 24 hours/day Type of Home: House Home Access: Stairs to enter Entergy CorporationEntrance Stairs-Number of Steps: 7 Entrance  Stairs-Rails: Right;Left;Can reach both Home Layout: One level         Bathroom Toilet: Standard     Home Equipment: Walker - 2 wheels;Cane - single point;Shower seat   Additional Comments: pt is home with wife who may not be able to assist him with LE wounds and his mobility needs      Prior Functioning/Environment Level of Independence: Independent with assistive device(s)        Comments: Pt refused majority of the activities for the session due to being adament to speak with MD. Elwin Sleight provided throughout session.         OT Problem List: Decreased safety awareness;Decreased knowledge of use of DME or AE;Decreased cognition      OT Treatment/Interventions:      OT Goals(Current goals can be found in the care plan section) Acute Rehab OT Goals Patient  Stated Goal: to walk when he is ready  OT Frequency:     Barriers to D/C:            Co-evaluation              AM-PAC OT "6 Clicks" Daily Activity     Outcome Measure                 End of Session    Activity Tolerance: Treatment limited secondary to agitation(and refusal. RM reports patient using resstroom indep.) Patient left: in chair;with call bell/phone within reach  OT Visit Diagnosis: Other abnormalities of gait and mobility (R26.89)                Time: 8757-9728 OT Time Calculation (min): 20 min Charges:  OT General Charges $OT Visit: 1 Visit OT Evaluation $OT Eval Low Complexity: [place], MSOT, OTR/L  Supplemental Rehabilitation Services  [phone]   Marius Ditch 11/20/2018, 11:03 AM

## 2018-11-20 NOTE — Plan of Care (Signed)

## 2018-11-20 NOTE — Progress Notes (Signed)
PROGRESS NOTE  Ivan MinisterLarry L Ryans VWU:[id number] DOB: 1940/10/04 DOA: 11/14/2018 PCP: Laurann MontanaWhite, [name], MD   LOS: 5 days   Patient is from: Home  Brief Narrative / Interim history: 78 year old male with history of poorly controlled DM-2, diastolic CHF, COPD, HTN, HLD, PVD and chronic venous stasis reportedly followed by wound care admitted on 11/15/18 with bilateral lower extremity cellulitis and [place].   No constitutional symptoms or signs of systemic infection. Started on vancomycin, Flagyl and ceftriaxone.  X-ray and MRI without osteomyelitis.  ABI with mild PVD of right LE.   See individual problem list below for more.  Subjective: No major events overnight of this morning.  Sitting on bedside chair.  Refuses to take Coreg this morning which was started after holding his lisinopril and Lasix due to AKI.  He complains some vague neck pain.  Denies headache, chest pain, palpitation, dyspnea, GI or GU symptoms.  Denies pain in his legs.  Objective: Vitals:   11/19/18 1635 11/19/18 1956 11/20/18 0409 11/20/18 0833  BP: (!) 151/75 (!) 165/90 (!) 173/87 (!) 159/87  Pulse: (!) 106 (!) 105 (!) 102 (!) 101  Resp:  15 20 17   Temp: 98.5 F (36.9 C) 97.8 F (36.6 C) 98.8 F (37.1 C) 98.4 F (36.9 C)  TempSrc: Oral Oral Oral Oral  SpO2: 100% 96% 96% 99%  Weight:      Height:        Intake/Output Summary (Last 24 hours) at 11/20/2018 1354 Last data filed at 11/20/2018 0500 Gross per 24 hour  Intake 150 ml  Output -  Net 150 ml   Filed Weights   11/15/18 0615  Weight: 88.5 kg    Examination:  GENERAL: No acute distress.  Appears well.  HEENT: MMM.  Vision and hearing grossly intact.  NECK: Supple.  No focal tenderness.  No apparent JVD. LUNGS:  No IWOB. Good air movement bilaterally. HEART:  RRR. Heart sounds normal.  ABD: Bowel sounds present. Soft. Non tender.  MSK/EXT:  Moves all extremities.  Some weakness in left leg (chronic per patient).  SKIN: Clean and dry dressing over both  lower extremities.  No erythema proximally. NEURO: Awake and oriented x4- date.  Some weakness in LLE compared to right which is chronic per patient).  Also 2+ patellar reflex in the right and 1+ patellar reflex in the left  I have personally reviewed the following labs and images:  Radiology Studies: No results found.  Microbiology: Recent Results (from the past 240 hour(s))  Culture, blood (Routine x 2)     Status: None   Collection Time: 11/14/18  9:14 PM   Specimen: BLOOD LEFT HAND  Result Value Ref Range Status   Specimen Description BLOOD LEFT HAND  Final   Special Requests   Final    BOTTLES DRAWN AEROBIC AND ANAEROBIC Blood Culture results may not be optimal due to an inadequate volume of blood received in culture bottles   Culture   Final    NO GROWTH 5 DAYS Performed at [facility], [address] [place], [place] [address]    Report Status 11/19/2018 FINAL  Final  Culture, blood (Routine x 2)     Status: None (Preliminary result)   Collection Time: 11/15/18  1:09 AM   Specimen: BLOOD LEFT HAND  Result Value Ref Range Status   Specimen Description BLOOD LEFT HAND  Final   Special Requests   Final    BOTTLES DRAWN AEROBIC AND ANAEROBIC Blood Culture results may  not be optimal due to an inadequate volume of blood received in culture bottles   Culture   Final    NO GROWTH 4 DAYS Performed at [facility], [address], [place], [place] [address]    Report Status PENDING  Incomplete  SARS Coronavirus 2 (CEPHEID - Performed in [facility] hospital lab), Hosp Order     Status: None   Collection Time: 11/15/18  1:52 AM   Specimen: Nasopharyngeal Swab  Result Value Ref Range Status   SARS Coronavirus 2 NEGATIVE NEGATIVE Final    Comment: (NOTE) If result is NEGATIVE SARS-CoV-2 target nucleic acids are NOT DETECTED. The SARS-CoV-2 RNA is generally detectable in upper and lower  respiratory specimens during the acute phase of infection. The lowest   concentration of SARS-CoV-2 viral copies this assay can detect is 250  copies / mL. A negative result does not preclude SARS-CoV-2 infection  and should not be used as the sole basis for treatment or other  patient management decisions.  A negative result may occur with  improper specimen collection / handling, submission of specimen other  than nasopharyngeal swab, presence of viral mutation(s) within the  areas targeted by this assay, and inadequate number of viral copies  (<250 copies / mL). A negative result must be combined with clinical  observations, patient history, and epidemiological information. If result is POSITIVE SARS-CoV-2 target nucleic acids are DETECTED. The SARS-CoV-2 RNA is generally detectable in upper and lower  respiratory specimens dur ing the acute phase of infection.  Positive  results are indicative of active infection with SARS-CoV-2.  Clinical  correlation with patient history and other diagnostic information is  necessary to determine patient infection status.  Positive results do  not rule out bacterial infection or co-infection with other viruses. If result is PRESUMPTIVE POSTIVE SARS-CoV-2 nucleic acids MAY BE PRESENT.   A presumptive positive result was obtained on the submitted specimen  and confirmed on repeat testing.  While 2019 novel coronavirus  (SARS-CoV-2) nucleic acids may be present in the submitted sample  additional confirmatory testing may be necessary for epidemiological  and / or clinical management purposes  to differentiate between  SARS-CoV-2 and other Sarbecovirus currently known to infect humans.  If clinically indicated additional testing with an alternate test  methodology [phone]) is advised. The SARS-CoV-2 RNA is generally  detectable in upper and lower respiratory sp ecimens during the acute  phase of infection. The expected result is Negative. Fact Sheet for Patients:  BoilerBrush.com.cy Fact Sheet  for Healthcare Providers: https://pope.com/ This test is not yet approved or cleared by the [place] FDA and has been authorized for detection and/or diagnosis of SARS-CoV-2 by FDA under an Emergency Use Authorization (EUA).  This EUA will remain in effect (meaning this test can be used) for the duration of the COVID-19 declaration under Section 564(b)(1) of the Act, 21 U.S.C. section 360bbb-3(b)(1), unless the authorization is terminated or revoked sooner. Performed at [facility], [address], [place], [place] [address]   MRSA PCR Screening     Status: None   Collection Time: 11/18/18  8:11 PM   Specimen: Nasal Mucosa; Nasopharyngeal  Result Value Ref Range Status   MRSA by PCR NEGATIVE NEGATIVE Final    Comment:        The GeneXpert MRSA Assay (FDA approved for NASAL specimens only), is one component of a comprehensive MRSA colonization surveillance program. It is not intended to diagnose MRSA infection nor to  guide or monitor treatment for MRSA infections. Performed at [facility], [address] [place], [place] [address]     Sepsis Labs: Invalid input(s): PROCALCITONIN, LACTICIDVEN  Urine analysis:    Component Value Date/Time   COLORURINE COLORLESS (A) 10/16/2016 1632   APPEARANCEUR CLEAR 10/16/2016 1632   LABSPEC 1.004 (L) 10/16/2016 1632   PHURINE 7.0 10/16/2016 1632   GLUCOSEU NEGATIVE 10/16/2016 1632   HGBUR NEGATIVE 10/16/2016 1632   BILIRUBINUR NEGATIVE 10/16/2016 1632   KETONESUR NEGATIVE 10/16/2016 1632   PROTEINUR NEGATIVE 10/16/2016 1632   UROBILINOGEN 0.2 09/19/2011 1701   NITRITE NEGATIVE 10/16/2016 1632   LEUKOCYTESUR NEGATIVE 10/16/2016 1632    Anemia Panel: No results for input(s): VITAMINB12, FOLATE, FERRITIN, TIBC, IRON, RETICCTPCT in the last 72 hours.  Thyroid Function Tests: No results for input(s): TSH, T4TOTAL, FREET4, T3FREE, THYROIDAB in the last 72 hours.  Lipid Profile: No results  for input(s): CHOL, HDL, LDLCALC, TRIG, CHOLHDL, LDLDIRECT in the last 72 hours.  CBG: Recent Labs  Lab 11/19/18 1542 11/19/18 1807 11/19/18 2139 11/20/18 0715 11/20/18 1202  GLUCAP 83 131* 161* 194* 149*    HbA1C: No results for input(s): HGBA1C in the last 72 hours.  BNP (last 3 results): Recent Labs    12/02/17 1453  PROBNP 22    Cardiac Enzymes: No results for input(s): CKTOTAL, CKMB, CKMBINDEX, TROPONINI in the last 168 hours.  Coagulation Profile: Recent Labs  Lab 11/14/18 2106  INR 1.1    Liver Function Tests: Recent Labs  Lab 11/14/18 2106 11/16/18 0338 11/19/18 0650  AST 28 23 30   ALT 14 14 18   ALKPHOS 65 52 50  BILITOT 0.5 0.5 0.7  PROT 7.3 6.2* 6.9  ALBUMIN 2.8* 2.4* 2.8*   No results for input(s): LIPASE, AMYLASE in the last 168 hours. No results for input(s): AMMONIA in the last 168 hours.  Basic Metabolic Panel: Recent Labs  Lab 11/14/18 2106 11/16/18 0338 11/17/18 1241 11/18/18 0249 11/19/18 0650 11/20/18 0327  NA 130* 137 137  --  135 136  K 4.8 4.4 4.4  --  4.5 4.5  CL 91* 97* 94*  --  94* 95*  CO2 29 32 33*  --  28 33*  GLUCOSE 207* 210* 164*  --  69* 196*  BUN 21 15 10   --  19 18  CREATININE 1.21 1.06 1.13 1.12 1.45* 1.38*  CALCIUM 8.7* 8.3* 8.6*  --  8.5* 8.2*  MG  --   --   --   --   --  1.7   GFR: Estimated Creatinine Clearance: 42.6 mL/min (A) (by C-G formula based on SCr of 1.38 mg/dL (H)).  CBC: Recent Labs  Lab 11/14/18 2106 11/16/18 0338 11/17/18 1241 11/19/18 0650 11/20/18 0327  WBC 7.3 6.7 6.9 6.1 5.9  NEUTROABS 5.3  --  5.4  --   --   HGB 14.7 13.1 14.6 13.8 13.1  HCT 49.6 45.1 49.2 47.7 45.4  MCV 90.8 91.3 92.5 92.6 93.0  PLT 168 181 PLATELET CLUMPS NOTED ON SMEAR, UNABLE TO ESTIMATE 213 184    Procedures:  None  Microbiology: COVID-19 negative Blood cultures negative MRSA PCR negative  Assessment & Plan: Bilateral lower extremity ulcers with cellulitis Lymphedema/chronic venous stasis  -Had a mild growth in both feet on admission -Osteomyelitis excluded by x-ray and MRI -ABI with mild right lower extremity PVD -Blood cultures, MRSA PCR negative -Flagyl 6/30-7/2 -Vancomycin and ceftriaxone 6/30-7/4 -Ceftazidime 7/4-- -Can be changed to oral antibiotics such as  doxycycline -wound care consult  Hypertension/tachycardia: BP elevated to 170s/80s this am.  Heart rate in low 100s. -Discontinued home lisinopril and Lasix due to AKI -Started Coreg 6.25 mg twice daily but patient refusing to take -We will try PRN labetalol.  Suboptimally controlled IDDM-2 with hyper and hypoglycemia: A1c 8.1%.  BG down to 69 earlier this morning but improved. -Change Levemir to Lantus -Continue SSI-moderate -Continue CBG monitoring -Start statin atorvastatin  AKI: Serum creatinine peaked at 1.45 and down to 1.38.  BUN 19.  He is on Lasix, lisinopril and vancomycin contribute -Discontinue lisinopril, Lasix and vancomycin -Continue monitoring  Chronic diastolic CHF: EF 60 to 65% in 12/2017.  Appears euvolemic. -Lasix on hold in the setting of AKI -Daily weight, intake output and renal functions  Chronic COPD: On 2 L by [place] at baseline  Generalized weakness/LLE weakness/debility: Neuro exam significant for LLE weakness compared to right and a symmetric patellar reflex.  Reportedly chronic per patient. -PT recommended SNF -Order OT eval and treatment  Social: Patient lives with his elderly wife and found to have Magod's in his toes/foot -PT recommended SNF -Consult CSW  History of noncompliance: Reportedly noncompliant with medications, oxygen and lymphedema device at home per discussion between previous provider and family. -Would benefit from SNF where he can get close supervision.   Delirium: reportedly had waxing or waning MS yesterday -Delirium precautions  DVT prophylaxis: Subcu Lovenox Code Status: Full code Family Communication: updated patient's wife Disposition Plan:  Remains inpatient pending SNF placement Consultants: None standard  Antimicrobials: Anti-infectives (From admission, onward)   Start     Dose/Rate Route Frequency Ordered Stop   11/19/18 1200  cefTAZidime (FORTAZ) 2 g in sodium chloride 0.9 % 100 mL IVPB     2 g 200 mL/hr over 30 Minutes Intravenous Every 12 hours 11/19/18 1113     11/16/18 0300  vancomycin (VANCOCIN) 1,250 mg in sodium chloride 0.9 % 250 mL IVPB  Status:  Discontinued     1,250 mg 166.7 mL/hr over 90 Minutes Intravenous Every 24 hours 11/15/18 0243 11/19/18 1416   11/15/18 1200  cefTRIAXone (ROCEPHIN) 1 g in sodium chloride 0.9 % 100 mL IVPB  Status:  Discontinued     1 g 200 mL/hr over 30 Minutes Intravenous Every 24 hours 11/15/18 1034 11/15/18 1109   11/15/18 1200  metroNIDAZOLE (FLAGYL) IVPB 500 mg  Status:  Discontinued     500 mg 100 mL/hr over 60 Minutes Intravenous Every 8 hours 11/15/18 1034 11/17/18 1056   11/15/18 1200  cefTRIAXone (ROCEPHIN) 2 g in sodium chloride 0.9 % 100 mL IVPB  Status:  Discontinued     2 g 200 mL/hr over 30 Minutes Intravenous Every 24 hours 11/15/18 1109 11/19/18 1059   11/15/18 0245  vancomycin (VANCOCIN) 1,750 mg in sodium chloride 0.9 % 500 mL IVPB     1,750 mg 250 mL/hr over 120 Minutes Intravenous  Once 11/15/18 0235 11/15/18 0532   11/15/18 0230  piperacillin-tazobactam (ZOSYN) IVPB 3.375 g     3.375 g 100 mL/hr over 30 Minutes Intravenous  Once 11/15/18 0229 11/15/18 0327      Sch Meds:  Scheduled Meds: . atorvastatin  20 mg Oral q1800  . carvedilol  6.25 mg Oral BID WC  . enoxaparin (LOVENOX) injection  40 mg Subcutaneous Q24H  . insulin aspart  0-15 Units Subcutaneous TID WC  . insulin aspart  0-5 Units Subcutaneous QHS  . insulin glargine  10 Units Subcutaneous Daily  . pantoprazole  40 mg Oral Daily  . sodium chloride flush  3 mL Intravenous Once   Continuous Infusions: . cefTAZidime (FORTAZ)  IV 2 g (11/20/18 0505)   PRN Meds:.acetaminophen **OR**  acetaminophen, albuterol, labetalol, ondansetron **OR** ondansetron (ZOFRAN) IV   Gilford Lardizabal T. Taeler Winning [facility]  If 7PM-7AM, please contact night-coverage [email or web address] Password [email or web address] 11/20/2018, 1:54 PM

## 2018-11-21 DIAGNOSIS — J449 Chronic obstructive pulmonary disease, unspecified: Secondary | ICD-10-CM

## 2018-11-21 DIAGNOSIS — E11621 Type 2 diabetes mellitus with foot ulcer: Principal | ICD-10-CM

## 2018-11-21 DIAGNOSIS — I10 Essential (primary) hypertension: Secondary | ICD-10-CM

## 2018-11-21 DIAGNOSIS — L97505 Non-pressure chronic ulcer of other part of unspecified foot with muscle involvement without evidence of necrosis: Secondary | ICD-10-CM

## 2018-11-21 LAB — BASIC METABOLIC PANEL
Anion gap: 10 (ref 5–15)
BUN: 16 mg/dL (ref 8–23)
CO2: 35 mmol/L — ABNORMAL HIGH (ref 22–32)
Calcium: 8.6 mg/dL — ABNORMAL LOW (ref 8.9–10.3)
Chloride: 95 mmol/L — ABNORMAL LOW (ref 98–111)
Creatinine, Ser: 1.2 mg/dL (ref 0.61–1.24)
GFR calc Af Amer: >60 mL/min (ref >60)
GFR calc non Af Amer: 58 mL/min — ABNORMAL LOW (ref >60)
Glucose, Bld: 112 mg/dL — ABNORMAL HIGH (ref 70–99)
Potassium: 4.7 mmol/L (ref 3.5–5.1)
Sodium: 140 mmol/L (ref 135–145)

## 2018-11-21 LAB — GLUCOSE, CAPILLARY
Glucose-Capillary: 98 mg/dL (ref 70–99)
Glucose-Capillary: 177 mg/dL — ABNORMAL HIGH (ref 70–99)
Glucose-Capillary: 171 mg/dL — ABNORMAL HIGH (ref 70–99)
Glucose-Capillary: 210 mg/dL — ABNORMAL HIGH (ref 70–99)

## 2018-11-21 LAB — MAGNESIUM: Magnesium: 1.8 mg/dL (ref 1.7–2.4)

## 2018-11-21 MED ORDER — CARVEDILOL 12.5 MG PO TABS
12.5000 mg | ORAL_TABLET | Freq: Two times a day (BID) | ORAL | Status: DC
  Administered 2018-11-21 – 2018-11-22 (×3): 12.5 mg via ORAL
  Filled 2018-11-21 (×3): qty 1

## 2018-11-21 MED ORDER — HYDRALAZINE HCL 20 MG/ML IJ SOLN: 10.0000 mg | Freq: Four times a day (QID) | INTRAMUSCULAR | Status: DC | PRN

## 2018-11-21 NOTE — NC FL2 (Signed)
[place] LEVEL OF CARE SCREENING TOOL     IDENTIFICATION  Patient Name: Ivan Parks Birthdate: Dec 19, 1940 Sex: male Admission Date (Current Location): 11/14/2018  [facility] and [place] Number:  Herbalist and Address:  [facility] [place], [place] [address] [place], [place] [address]      Provider Number: [id number]  Attending Physician Name and Address:  Kinnie Feil, MD  Relative Name and Phone Number:  (Wife : Joaquim Tolen [phone])    Current Level of Care: Hospital Recommended Level of Care: [place] Prior Approval Number:    Date Approved/Denied:   PASRR Number: ([id number])  Discharge Plan: SNF    Current Diagnoses: Patient Active Problem List   Diagnosis Date Noted  . Infection 11/15/2018  . 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 Chest pain 12/02/2017  . Peripheral vascular disease ([place])   . Hyperlipidemia   . CHF (congestive heart failure) ([place])   . Diabetes ([place]) 05/02/2016  . AKI (acute kidney injury) ([place]) 04/01/2016  . Hypotension 03/31/2016  . Essential hypertension 03/06/2015  . Chronic respiratory failure ([place]) 11/26/2014  . Leg edema, right- Greater than Left leg 07/05/2014  . Allergic rhinitis 11/09/2012  . COPD, severe ([place]) 11/19/2010    Orientation RESPIRATION BLADDER Height & Weight     Self, Time, Situation  O2(oxygen at 2L  [place]) Continent Weight: 88.5 kg Height:  5\' 2"  (157.5 cm)  BEHAVIORAL SYMPTOMS/MOOD NEUROLOGICAL BOWEL NUTRITION STATUS      Continent Diet, Feeding tube(low sodium)  AMBULATORY STATUS COMMUNICATION OF NEEDS Skin   Limited Assist Verbally Other (Comment)(bilateral lower extremities cellulitis with ulcer)                       Personal Care Assistance Level of Assistance  Bathing Bathing Assistance: Limited assistance         Functional Limitations Info             SPECIAL CARE FACTORS FREQUENCY  PT (By licensed PT), OT (By licensed  OT)     PT Frequency: (min 5/weekly) OT Frequency: (min 5/weekly)            Contractures Contractures Info: Not present    Additional Factors Info  Code Status, Allergies(full code      no known allergies) Code Status Info: (full code) Allergies Info: (no known allergies)           Current Medications (11/21/2018):  This is the current hospital active medication list Current Facility-Administered Medications  Medication Dose Route Frequency Provider Last Rate Last Dose  . acetaminophen (TYLENOL) tablet 650 mg  650 mg Oral Q6H PRN Caren Griffins, MD   650 mg at 11/19/18 1958   Or  . acetaminophen (TYLENOL) suppository 650 mg  650 mg Rectal Q6H PRN Caren Griffins, MD      . albuterol (PROVENTIL) (2.5 MG/3ML) 0.083% nebulizer solution 2.5 mg  2.5 mg Nebulization Q6H PRN Caren Griffins, MD      . atorvastatin (LIPITOR) tablet 20 mg  20 mg Oral q1800 Wendee Beavers T, MD   20 mg at 11/20/18 1712  . carvedilol (COREG) tablet 12.5 mg  12.5 mg Oral BID WC Buriev, Arie Sabina, MD      . cefTAZidime (FORTAZ) 2 g in sodium chloride 0.9 % 100 mL IVPB  2 g Intravenous Q12H Luiz Ochoa, RPH 200 mL/hr at 11/21/18 0531 2 g at 11/21/18 0531  .  enoxaparin (LOVENOX) injection 40 mg  40 mg Subcutaneous Q24H Leatha GildingGherghe, Costin M, MD   40 mg at 11/20/18 1459  . hydrALAZINE (APRESOLINE) injection 10 mg  10 mg Intravenous Q6H PRN Buriev, Isaiah SergeUlugbek N, MD      . insulin aspart (novoLOG) injection 0-15 Units  0-15 Units Subcutaneous TID WC Leatha GildingGherghe, Costin M, MD   3 Units at 11/21/18 1232  . insulin glargine (LANTUS) injection 10 Units  10 Units Subcutaneous Daily Almon HerculesGonfa, Taye T, MD   10 Units at 11/21/18 0857  . ondansetron (ZOFRAN) tablet 4 mg  4 mg Oral Q6H PRN Leatha GildingGherghe, Costin M, MD       Or  . ondansetron (ZOFRAN) injection 4 mg  4 mg Intravenous Q6H PRN Leatha GildingGherghe, Costin M, MD   4 mg at 11/17/18 1313  . pantoprazole (PROTONIX) EC tablet 40 mg  40 mg Oral Daily Leatha GildingGherghe, Costin M, MD   40 mg at 11/21/18  0855  . sodium chloride flush (NS) 0.9 % injection 3 mL  3 mL Intravenous Once Pollina, Canary Brimhristopher J, MD         Discharge Medications: Please see discharge summary for a list of discharge medications.  Relevant Imaging Results:  Relevant Lab Results:   Additional Information (SS#  [id number])  Durenda GuthrieBrady, Jayse Hodkinson Naomi, RN

## 2018-11-21 NOTE — NC FL2 (Deleted)
[place] LEVEL OF CARE SCREENING TOOL     IDENTIFICATION  Patient Name: Ivan Parks Birthdate: 1940-05-22 Sex: male Admission Date (Current Location): 11/14/2018  [facility] and [place] Number:  Herbalist and Address:  [facility] [place]. [facility], [place] [address], [place], [place] [address]      Provider Number: [id number]  Attending Physician Name and Address:  Kinnie Feil, MD  Relative Name and Phone Number:       Current Level of Care: Hospital Recommended Level of Care: [place] Prior Approval Number:    Date Approved/Denied:   PASRR Number: [id number]  Discharge Plan: SNF    Current Diagnoses: Patient Active Problem List   Diagnosis Date Noted  . Infection 11/15/2018  . 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 Chest pain 12/02/2017  . Peripheral vascular disease ([place])   . Hyperlipidemia   . CHF (congestive heart failure) ([place])   . Diabetes ([place]) 05/02/2016  . AKI (acute kidney injury) ([place]) 04/01/2016  . Hypotension 03/31/2016  . Essential hypertension 03/06/2015  . Chronic respiratory failure ([place]) 11/26/2014  . Leg edema, right- Greater than Left leg 07/05/2014  . Allergic rhinitis 11/09/2012  . COPD, severe ([place]) 11/19/2010    Orientation RESPIRATION BLADDER Height & Weight     Self, Time, Situation, Place  Normal Continent Weight: 88.5 kg Height:  5\' 2"  (157.5 cm)  BEHAVIORAL SYMPTOMS/MOOD NEUROLOGICAL BOWEL NUTRITION STATUS      Continent Diet  AMBULATORY STATUS COMMUNICATION OF NEEDS Skin   Limited Assist Verbally (P) Other (Comment)(cellulitis of bilateral lower legs, with  w,)                       Personal Care Assistance Level of Assistance  Bathing Bathing Assistance: Limited assistance         Functional Limitations Info             SPECIAL CARE FACTORS FREQUENCY                       Contractures Contractures Info: Not present    Additional Factors Info                   Current Medications (11/21/2018):  This is the current hospital active medication list Current Facility-Administered Medications  Medication Dose Route Frequency Provider Last Rate Last Dose  . acetaminophen (TYLENOL) tablet 650 mg  650 mg Oral Q6H PRN Caren Griffins, MD   650 mg at 11/19/18 1958   Or  . acetaminophen (TYLENOL) suppository 650 mg  650 mg Rectal Q6H PRN Caren Griffins, MD      . albuterol (PROVENTIL) (2.5 MG/3ML) 0.083% nebulizer solution 2.5 mg  2.5 mg Nebulization Q6H PRN Caren Griffins, MD      . atorvastatin (LIPITOR) tablet 20 mg  20 mg Oral q1800 Wendee Beavers T, MD   20 mg at 11/20/18 1712  . carvedilol (COREG) tablet 12.5 mg  12.5 mg Oral BID WC Buriev, Arie Sabina, MD      . cefTAZidime (FORTAZ) 2 g in sodium chloride 0.9 % 100 mL IVPB  2 g Intravenous Q12H Luiz Ochoa, RPH 200 mL/hr at 11/21/18 0531 2 g at 11/21/18 0531  . enoxaparin (LOVENOX) injection 40 mg  40 mg Subcutaneous Q24H Caren Griffins, MD   40 mg at 11/20/18 1459  . hydrALAZINE (APRESOLINE) injection 10 mg  10 mg Intravenous  Q6H PRN Esperanza SheetsBuriev, Ulugbek N, MD      . insulin aspart (novoLOG) injection 0-15 Units  0-15 Units Subcutaneous TID WC Leatha GildingGherghe, Costin M, MD   3 Units at 11/21/18 1232  . insulin glargine (LANTUS) injection 10 Units  10 Units Subcutaneous Daily Almon HerculesGonfa, Taye T, MD   10 Units at 11/21/18 0857  . ondansetron (ZOFRAN) tablet 4 mg  4 mg Oral Q6H PRN Leatha GildingGherghe, Costin M, MD       Or  . ondansetron (ZOFRAN) injection 4 mg  4 mg Intravenous Q6H PRN Leatha GildingGherghe, Costin M, MD   4 mg at 11/17/18 1313  . pantoprazole (PROTONIX) EC tablet 40 mg  40 mg Oral Daily Leatha GildingGherghe, Costin M, MD   40 mg at 11/21/18 0855  . sodium chloride flush (NS) 0.9 % injection 3 mL  3 mL Intravenous Once Pollina, Canary Brimhristopher J, MD         Discharge Medications: Please see discharge summary for a list of discharge medications.  Relevant Imaging Results:  Relevant Lab Results:   Additional  Information SS#[id number]  Durenda GuthrieBrady, Kendahl Bumgardner Naomi, RN

## 2018-11-21 NOTE — TOC Initial Note (Signed)
Transition of Care [facility](TOC) - Initial/Assessment Note    Patient Details  Name: Ivan MinisterLarry L Ertel MRN: [id number] Date of Birth: Aug 04, 1940  Transition of Care [facility](TOC) CM/SW Contact:    Durenda GuthrieBrady, Tavien Chestnut Naomi, RN Phone Number: 11/21/2018, 12:59 PM  Clinical Narrative:   78 yr old gentleman admitted with bilateral lower leg cellulitis and ulcers. Case manager spoke with patient at length concerning  Discharge plan and need for shortterm care at a SNF. After 20 minutes of conversation patient said to call his wife. Case Manager spoke with Mrs. Anselm Lisnoch, [phone], concerning her husband's need for shortterm rehab. She agrees and asked that he be faxed out. CM discussed facilities and she [facility], [facility], [facility] and  [facility]. FL2 has been completed, Sw assisting with faxing information.    Expected Discharge Plan: Skilled Nursing Facility Barriers to Discharge: No Barriers Identified   Patient Goals and CMS Choice Patient states their goals for this hospitalization and ongoing recovery are:: get better, CMS Medicare.gov Compare Post Acute Care list provided to:: (patient's wife Littie DeedsHattie Borgwardt discussed facilities with CM) Choice offered to / list presented to : Spouse(Hattie Anselm LisEnoch)  Expected Discharge Plan and Services Expected Discharge Plan: Skilled Nursing Facility   Discharge Planning Services: CM Consult                     DME Arranged: N/A         HH Arranged: NA          Prior Living Arrangements/Services   Lives with:: Spouse Patient language and need for interpreter reviewed:: No            Current home services: Other (comment)(oxygen) Criminal Activity/Legal Involvement Pertinent to Current Situation/Hospitalization: No - Comment as needed  Activities of Daily Living Home Assistive Devices/Equipment: Walker (specify type), Cane (specify quad or straight) ADL Screening (condition at time of admission) Patient's cognitive ability adequate to  safely complete daily activities?: Yes Is the patient deaf or have difficulty hearing?: No Does the patient have difficulty seeing, even when wearing glasses/contacts?: No Does the patient have difficulty concentrating, remembering, or making decisions?: No Patient able to express need for assistance with ADLs?: Yes Does the patient have difficulty dressing or bathing?: No Independently performs ADLs?: Yes (appropriate for developmental age) Does the patient have difficulty walking or climbing stairs?: Yes Weakness of Legs: Both Weakness of Arms/Hands: None  Permission Sought/Granted                  Emotional Assessment       Orientation: : Oriented to Self, Oriented to Place, Oriented to  Time, Oriented to Situation Alcohol / Substance Use: Not Applicable    Admission diagnosis:  Diabetic foot infection (HCC) [W29.562[E11.628, L08.9] Patient Active Problem List   Diagnosis Date Noted  . Infection 11/15/2018  . 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 Chest pain 12/02/2017  . Peripheral vascular disease (HCC)   . Hyperlipidemia   . CHF (congestive heart failure) (HCC)   . Diabetes (HCC) 05/02/2016  . AKI (acute kidney injury) (HCC) 04/01/2016  . Hypotension 03/31/2016  . Essential hypertension 03/06/2015  . Chronic respiratory failure (HCC) 11/26/2014  . Leg edema, right- Greater than Left leg 07/05/2014  . Allergic rhinitis 11/09/2012  . 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 [facility] [place], [place] - [address] [place] [address] Phone: [phone]  Fax: [phone]  [facility] [place], [place] - [address] [place] [address] Phone: [phone] Fax: [phone]  [place] [address] [place] [address] [place] [address] Phone: [phone] Fax:  [phone]     Social Determinants of Health ([place]) Interventions    Readmission Risk Interventions No flowsheet data found.

## 2018-11-21 NOTE — Progress Notes (Signed)
[facility] PROGRESS NOTE  MART COLPITTS IOE:[id number] DOB: 07/01/1940 DOA: 11/14/2018 PCP: Harlan Stains, MD  Brief summary   78 year old male with history of poorly controlled DM-2, diastolic CHF, COPD, HTN, HLD, PVD and chronic venous stasis reportedly followed by wound care admitted on 11/15/18 with bilateral lower extremity cellulitis and Magots.   No constitutional symptoms or signs of systemic infection. Started on vancomycin, Flagyl and ceftriaxone.  X-ray and MRI without osteomyelitis.  ABI with mild PVD of right LE.   See individual problem list below for more  Assessment/Plan:  Bilateral lower extremity ulcers with cellulitis. Lymphedema/chronic venous stasis. Osteomyelitis excluded by x-ray and MRI. ABI with mild right lower extremity PVD. Blood cultures, MRSA PCR negative. Treated with Flagyl 6/30-7/2. Vancomycin and ceftriaxone 6/30-7/4. Ceftazidime 7/4--. Plan to transition to at discharge. wound care consulted  Hypertension/tachycardia: Discontinued home lisinopril due to AKI. started Coreg , adjust as needed. refusing to take. We will try PRN hydralazine   IDDM-2 with hyper and hypoglycemia: A1c 8.1%. cont levemir. + ISS. Titrate as needed  AKI: Serum creatinine peaked at 1.45 and down to 1.38.  BUN 19.  hold  lisinopril and vancomycin contribute  Chronic diastolic CHF: EF 60 to 18% in 12/2017.  Appears euvolemic. Resume home Lasix. Daily weight, intake output and renal functions  Chronic COPD: On 2 L by [place] at baseline  Generalized weakness/LLE weakness/debility: Neuro exam significant for LLE weakness compared to right and a symmetric patellar reflex.  Reportedly chronic per patient. PT recommended SNF.   Social: Patient lives with his elderly wife and found to have Magod's in his toes/foot. PT recommended SNF. Consult CSW  History of noncompliance: Reportedly noncompliant with medications, oxygen and lymphedema device at home per discussion between  previous provider and family. -Would benefit from SNF where he can get close supervision.   Delirium: reportedly had waxing or waning MS. Probable underlying mild congnitive insufficiency. Delirium precautions  Code Status: full Family Communication: d/w patient, RN (indicate person spoken with, relationship, and if by phone, the number) Disposition Plan: SNF ? On 7/7   Consultants:  Wound care  Procedures:  none  Antibiotics: Anti-infectives (From admission, onward)   Start     Dose/Rate Route Frequency Ordered Stop   11/19/18 1200  cefTAZidime (FORTAZ) 2 g in sodium chloride 0.9 % 100 mL IVPB     2 g 200 mL/hr over 30 Minutes Intravenous Every 12 hours 11/19/18 1113     11/16/18 0300  vancomycin (VANCOCIN) 1,250 mg in sodium chloride 0.9 % 250 mL IVPB  Status:  Discontinued     1,250 mg 166.7 mL/hr over 90 Minutes Intravenous Every 24 hours 11/15/18 0243 11/19/18 1416   11/15/18 1200  cefTRIAXone (ROCEPHIN) 1 g in sodium chloride 0.9 % 100 mL IVPB  Status:  Discontinued     1 g 200 mL/hr over 30 Minutes Intravenous Every 24 hours 11/15/18 1034 11/15/18 1109   11/15/18 1200  metroNIDAZOLE (FLAGYL) IVPB 500 mg  Status:  Discontinued     500 mg 100 mL/hr over 60 Minutes Intravenous Every 8 hours 11/15/18 1034 11/17/18 1056   11/15/18 1200  cefTRIAXone (ROCEPHIN) 2 g in sodium chloride 0.9 % 100 mL IVPB  Status:  Discontinued     2 g 200 mL/hr over 30 Minutes Intravenous Every 24 hours 11/15/18 1109 11/19/18 1059   11/15/18 0245  vancomycin (VANCOCIN) 1,750 mg in sodium chloride 0.9 % 500 mL IVPB     1,750 mg 250 mL/hr over  120 Minutes Intravenous  Once 11/15/18 0235 11/15/18 0532   11/15/18 0230  piperacillin-tazobactam (ZOSYN) IVPB 3.375 g     3.375 g 100 mL/hr over 30 Minutes Intravenous  Once 11/15/18 0229 11/15/18 0327        (indicate start date, and stop date if known)  HPI/Subjective: No acute distress. Denies SOB. Able to walk in his room. No chest pains    Objective: Vitals:   11/21/18 0319 11/21/18 0843  BP: (!) 168/83 (!) 160/82  Pulse: 88 90  Resp:  16  Temp: 97.6 F (36.4 C) 97.8 F (36.6 C)  SpO2: 97% 100%    Intake/Output Summary (Last 24 hours) at 11/21/2018 1158 Last data filed at 11/21/2018 0900 Gross per 24 hour  Intake 548 ml  Output 5 ml  Net 543 ml   Filed Weights   11/15/18 0615  Weight: 88.5 kg    Exam:   General:  No distress   Cardiovascular: s1,s2 rrr  Respiratory: CTA BL   Abdomen: soft, nt   Musculoskeletal: wounds in LE    Data Reviewed: Basic Metabolic Panel: Recent Labs  Lab 11/16/18 0338 11/17/18 1241 11/18/18 0249 11/19/18 0650 11/20/18 0327 11/21/18 0447  NA 137 137  --  135 136 140  K 4.4 4.4  --  4.5 4.5 4.7  CL 97* 94*  --  94* 95* 95*  CO2 32 33*  --  28 33* 35*  GLUCOSE 210* 164*  --  69* 196* 112*  BUN 15 10  --  19 18 16   CREATININE 1.06 1.13 1.12 1.45* 1.38* 1.20  CALCIUM 8.3* 8.6*  --  8.5* 8.2* 8.6*  MG  --   --   --   --  1.7 1.8   Liver Function Tests: Recent Labs  Lab 11/14/18 2106 11/16/18 0338 11/19/18 0650  AST 28 23 30   ALT 14 14 18   ALKPHOS 65 52 50  BILITOT 0.5 0.5 0.7  PROT 7.3 6.2* 6.9  ALBUMIN 2.8* 2.4* 2.8*   No results for input(s): LIPASE, AMYLASE in the last 168 hours. No results for input(s): AMMONIA in the last 168 hours. CBC: Recent Labs  Lab 11/14/18 2106 11/16/18 0338 11/17/18 1241 11/19/18 0650 11/20/18 0327  WBC 7.3 6.7 6.9 6.1 5.9  NEUTROABS 5.3  --  5.4  --   --   HGB 14.7 13.1 14.6 13.8 13.1  HCT 49.6 45.1 49.2 47.7 45.4  MCV 90.8 91.3 92.5 92.6 93.0  PLT 168 181 PLATELET CLUMPS NOTED ON SMEAR, UNABLE TO ESTIMATE 213 184   Cardiac Enzymes: No results for input(s): CKTOTAL, CKMB, CKMBINDEX, TROPONINI in the last 168 hours. BNP (last 3 results) No results for input(s): BNP in the last 8760 hours.  ProBNP (last 3 results) Recent Labs    12/02/17 1453  PROBNP 22    CBG: Recent Labs  Lab 11/20/18 1202  11/20/18 1709 11/20/18 2146 11/21/18 0739 11/21/18 1137  GLUCAP 149* 151* 167* 98 177*    Recent Results (from the past 240 hour(s))  Culture, blood (Routine x 2)     Status: None   Collection Time: 11/14/18  9:14 PM   Specimen: BLOOD LEFT HAND  Result Value Ref Range Status   Specimen Description BLOOD LEFT HAND  Final   Special Requests   Final    BOTTLES DRAWN AEROBIC AND ANAEROBIC Blood Culture results may not be optimal due to an inadequate volume of blood received in culture bottles   Culture   Final  NO GROWTH 5 DAYS Performed at [facility], [address] [place], [place] [address]    Report Status 11/19/2018 FINAL  Final  Culture, blood (Routine x 2)     Status: None   Collection Time: 11/15/18  1:09 AM   Specimen: BLOOD LEFT HAND  Result Value Ref Range Status   Specimen Description BLOOD LEFT HAND  Final   Special Requests   Final    BOTTLES DRAWN AEROBIC AND ANAEROBIC Blood Culture results may not be optimal due to an inadequate volume of blood received in culture bottles   Culture   Final    NO GROWTH 5 DAYS Performed at [facility], [address] [place], [place] [address]    Report Status 11/20/2018 FINAL  Final  SARS Coronavirus 2 (CEPHEID - Performed in [facility] hospital lab), Hosp Order     Status: None   Collection Time: 11/15/18  1:52 AM   Specimen: Nasopharyngeal Swab  Result Value Ref Range Status   SARS Coronavirus 2 NEGATIVE NEGATIVE Final    Comment: (NOTE) If result is NEGATIVE SARS-CoV-2 target nucleic acids are NOT DETECTED. The SARS-CoV-2 RNA is generally detectable in upper and lower  respiratory specimens during the acute phase of infection. The lowest  concentration of SARS-CoV-2 viral copies this assay can detect is 250  copies / mL. A negative result does not preclude SARS-CoV-2 infection  and should not be used as the sole basis for treatment or other  patient management decisions.  A negative result may occur  with  improper specimen collection / handling, submission of specimen other  than nasopharyngeal swab, presence of viral mutation(s) within the  areas targeted by this assay, and inadequate number of viral copies  (<250 copies / mL). A negative result must be combined with clinical  observations, patient history, and epidemiological information. If result is POSITIVE SARS-CoV-2 target nucleic acids are DETECTED. The SARS-CoV-2 RNA is generally detectable in upper and lower  respiratory specimens dur ing the acute phase of infection.  Positive  results are indicative of active infection with SARS-CoV-2.  Clinical  correlation with patient history and other diagnostic information is  necessary to determine patient infection status.  Positive results do  not rule out bacterial infection or co-infection with other viruses. If result is PRESUMPTIVE POSTIVE SARS-CoV-2 nucleic acids MAY BE PRESENT.   A presumptive positive result was obtained on the submitted specimen  and confirmed on repeat testing.  While 2019 novel coronavirus  (SARS-CoV-2) nucleic acids may be present in the submitted sample  additional confirmatory testing may be necessary for epidemiological  and / or clinical management purposes  to differentiate between  SARS-CoV-2 and other Sarbecovirus currently known to infect humans.  If clinically indicated additional testing with an alternate test  methodology [phone]) is advised. The SARS-CoV-2 RNA is generally  detectable in upper and lower respiratory sp ecimens during the acute  phase of infection. The expected result is Negative. Fact Sheet for Patients:  BoilerBrush.com.cyhttps://www.fda.gov/media/136312/download Fact Sheet for Healthcare Providers: https://pope.com/https://www.fda.gov/media/136313/download This test is not yet approved or cleared by the [place] FDA and has been authorized for detection and/or diagnosis of SARS-CoV-2 by FDA under an Emergency Use Authorization (EUA).  This EUA  will remain in effect (meaning this test can be used) for the duration of the COVID-19 declaration under Section 564(b)(1) of the Act, 21 U.S.C. section 360bbb-3(b)(1), unless the authorization is terminated or revoked sooner. Performed at [facility],  [address] [place], [place] [address]   MRSA PCR Screening     Status: None   Collection Time: 11/18/18  8:11 PM   Specimen: Nasal Mucosa; Nasopharyngeal  Result Value Ref Range Status   MRSA by PCR NEGATIVE NEGATIVE Final    Comment:        The GeneXpert MRSA Assay (FDA approved for NASAL specimens only), is one component of a comprehensive MRSA colonization surveillance program. It is not intended to diagnose MRSA infection nor to guide or monitor treatment for MRSA infections. Performed at [facility], [address] [place], [place] [address]      Studies: No results found.  Scheduled Meds: . atorvastatin  20 mg Oral q1800  . carvedilol  6.25 mg Oral BID WC  . enoxaparin (LOVENOX) injection  40 mg Subcutaneous Q24H  . insulin aspart  0-15 Units Subcutaneous TID WC  . insulin aspart  0-5 Units Subcutaneous QHS  . insulin glargine  10 Units Subcutaneous Daily  . pantoprazole  40 mg Oral Daily  . sodium chloride flush  3 mL Intravenous Once   Continuous Infusions: . cefTAZidime (FORTAZ)  IV 2 g (11/21/18 0531)    Active Problems:   COPD, severe (HCC)   Diabetes (HCC)   Peripheral vascular disease (HCC)   Hyperlipidemia   CHF (congestive heart failure) (HCC)   Infection   Diabetic foot ulcers (HCC)    Time spent: >35 minutes     Esperanza SheetsBURIEV, Tymir Terral N  [facility] Pager [phone]. If 7PM-7AM, please contact night-coverage at [email or web address], password [email or web address] 11/21/2018, 11:58 AM  LOS: 6 days

## 2018-11-21 NOTE — Care Management Important Message (Signed)
Important Message  Patient Details  Name: Ivan Parks MRN: [id number] Date of Birth: April 11, 1941   Medicare Important Message Given:  Yes     Memory Argue 11/21/2018, 2:21 PM

## 2018-11-21 NOTE — TOC Progression Note (Signed)
Transition of Care [facility]) - Progression Note    Patient Details  Name: Ivan Parks MRN: [id number] Date of Birth: March 25, 1941  Transition of Care [facility] CM/SW [place], [place] Phone Number: 11/21/2018, 3:17 PM  Clinical Narrative:     [facility] offered bed and started [facility] insurance auth today. Will need updated COVID test before discharge tomorrow, informed MD of SARS rapid COVID test needed.  Expected Discharge Plan: Skilled Nursing Facility Barriers to Discharge: No Barriers Identified  Expected Discharge Plan and Services Expected Discharge Plan: [place]   Discharge Planning Services: CM Consult                     DME Arranged: N/A         HH Arranged: NA           Social Determinants of Health (SDOH) Interventions    Readmission Risk Interventions No flowsheet data found.

## 2018-11-22 DIAGNOSIS — I739 Peripheral vascular disease, unspecified: Secondary | ICD-10-CM

## 2018-11-22 DIAGNOSIS — I5032 Chronic diastolic (congestive) heart failure: Secondary | ICD-10-CM

## 2018-11-22 LAB — GLUCOSE, CAPILLARY
Glucose-Capillary: 100 mg/dL — ABNORMAL HIGH (ref 70–99)
Glucose-Capillary: 144 mg/dL — ABNORMAL HIGH (ref 70–99)
Glucose-Capillary: 172 mg/dL — ABNORMAL HIGH (ref 70–99)

## 2018-11-22 LAB — SARS CORONAVIRUS 2 BY RT PCR (HOSPITAL ORDER, PERFORMED IN ~~LOC~~ HOSPITAL LAB): SARS Coronavirus 2: NEGATIVE

## 2018-11-22 MED ORDER — CARVEDILOL 12.5 MG PO TABS: 12.5000 mg | ORAL_TABLET | Freq: Two times a day (BID) | ORAL | 0 refills | Status: AC

## 2018-11-22 MED ORDER — INSULIN ASPART 100 UNIT/ML ~~LOC~~ SOLN: 0.0000 [IU] | Freq: Three times a day (TID) | SUBCUTANEOUS | 11 refills | Status: DC

## 2018-11-22 MED ORDER — DOXYCYCLINE HYCLATE 100 MG PO TABS: 100.0000 mg | ORAL_TABLET | Freq: Two times a day (BID) | ORAL | 0 refills | Status: AC

## 2018-11-22 MED ORDER — DOXYCYCLINE HYCLATE 100 MG PO TABS
100.0000 mg | ORAL_TABLET | Freq: Two times a day (BID) | ORAL | Status: DC
  Administered 2018-11-22: 100 mg via ORAL
  Filled 2018-11-22: qty 1

## 2018-11-22 MED ORDER — ACETAMINOPHEN 325 MG PO TABS: 650.0000 mg | ORAL_TABLET | Freq: Four times a day (QID) | ORAL | Status: AC | PRN

## 2018-11-22 MED ORDER — INSULIN GLARGINE 100 UNIT/ML ~~LOC~~ SOLN: 10.0000 [IU] | Freq: Every day | SUBCUTANEOUS | 11 refills | Status: DC

## 2018-11-22 MED ORDER — ATORVASTATIN CALCIUM 20 MG PO TABS: 20.0000 mg | ORAL_TABLET | Freq: Every day | ORAL | 0 refills | Status: AC

## 2018-11-22 NOTE — Progress Notes (Signed)
[place]) for report about the patient ;pt is assigned to [place] 126A;Tracy will be the receiving RN; pt's wife Hattie notified and updated.

## 2018-11-22 NOTE — Progress Notes (Addendum)
Pharmacy Antibiotic Note  Ivan Parks is a 78 y.o. male admitted on 11/14/2018 with bilateral lower extremity diabetic ulcers with cellulitis. MRI negative for osteomyelitis.  Ceftriaxone/Vancomycin switched to Ceftazidime per MD. Pharmacy has been consulted for antibiotic dosing.  Plan:  Continue Ceftazidime 2g IV q12h D/W MD, patient to transition to PO therapy to complete 10 days of abx.  Monitor renal function, cultures, clinical course  Height: 5\' 2"  (157.5 cm) Weight: 195 lb 1.7 oz (88.5 kg) IBW/kg (Calculated) : 54.6  Temp (24hrs), Avg:98 F (36.7 C), Min:97.5 F (36.4 C), Max:98.6 F (37 C)  Recent Labs  Lab 11/16/18 0338 11/17/18 1241 11/18/18 0249 11/19/18 0650 11/20/18 0327 11/21/18 0447  WBC 6.7 6.9  --  6.1 5.9  --   CREATININE 1.06 1.13 1.12 1.45* 1.38* 1.20  VANCOPEAK  --   --   --  33  --   --     Estimated Creatinine Clearance: 48.9 mL/min (by C-G formula based on SCr of 1.2 mg/dL).    No Known Allergies   Ivan Parks A. Levada Dy, PharmD, [place] Please utilize Amion for appropriate phone number to reach the unit pharmacist ([place])   11/22/2018 9:32 AM

## 2018-11-22 NOTE — Consult Note (Signed)
WOC consulted for LE's. Patient was seen this admission for same on 11/18/18. Orders entered.   For this reason will not consult again.   Re consult if needed, will not follow at this time. Thanks  Courtnee Myer [employer], RN,CWOCN, CNS, [place] [phone])

## 2018-11-22 NOTE — Progress Notes (Signed)
[facility] PROGRESS NOTE  Ivan MinisterLarry L Parks BJY:[id number]:[id number] DOB: 15-Dec-1940 DOA: 11/14/2018 PCP: Laurann MontanaWhite, [name], Ivan Parks  Brief summary   78 year old male with history of poorly controlled DM-2, diastolic CHF, COPD, HTN, HLD, PVD and chronic venous stasis reportedly followed by wound care admitted on 11/15/18 with bilateral lower extremity cellulitis and Magots.   No constitutional symptoms or signs of systemic infection. Started on vancomycin, Flagyl and ceftriaxone.  X-ray and MRI without osteomyelitis.  ABI with mild PVD of right LE.   See individual problem list below for more  Assessment/Plan:  Bilateral lower extremity ulcers with cellulitis. Lymphedema/chronic venous stasis. Osteomyelitis excluded by x-ray and MRI. ABI with mild right lower extremity PVD. Blood cultures, MRSA PCR negative. Treated with Flagyl 6/30-7/2. Vancomycin and ceftriaxone 6/30-7/4. Ceftazidime 7/4--7/7. Then transitioned to doxy for 2 days. wound care consulted  Hypertension/tachycardia: Discontinued home lisinopril due to AKI. started Coreg , adjust as needed. refusing to take. We will try PRN hydralazine   IDDM-2 with hyper and hypoglycemia: A1c 8.1%. cont levemir. + ISS. Titrate as needed  AKI: Serum creatinine peaked at 1.45 and down to 1.38.  BUN 19.  hold  lisinopril and vancomycin contribute  Chronic diastolic CHF: EF 60 to 65% in 0/86578/2019.  Appears euvolemic. Resume home Lasix. Daily weight, intake output and renal functions  Chronic COPD: On 2 L by [place] at baseline  Generalized weakness/LLE weakness/debility: Neuro exam significant for LLE weakness compared to right and a symmetric patellar reflex.  Reportedly chronic per patient. PT recommended SNF.   Social: Patient lives with his elderly wife and found to have Magod's in his toes/foot. PT recommended SNF. Consult CSW  History of noncompliance: Reportedly noncompliant with medications, oxygen and lymphedema device at home per discussion between  previous provider and family. -Would benefit from SNF where he can get close supervision.   Delirium: reportedly had waxing or waning MS. Probable underlying mild congnitive insufficiency. Delirium precautions  Code Status: full Family Communication: d/w patient, RN (indicate person spoken with, relationship, and if by phone, the number) Disposition Plan: awaiting SNF   Consultants:  Wound care  Procedures:  none  Antibiotics: Anti-infectives (From admission, onward)   Start     Dose/Rate Route Frequency Ordered Stop   11/22/18 1000  doxycycline (VIBRA-TABS) tablet 100 mg     100 mg Oral Every 12 hours 11/22/18 0952     11/19/18 1200  cefTAZidime (FORTAZ) 2 g in sodium chloride 0.9 % 100 mL IVPB  Status:  Discontinued     2 g 200 mL/hr over 30 Minutes Intravenous Every 12 hours 11/19/18 1113 11/22/18 0952   11/16/18 0300  vancomycin (VANCOCIN) 1,250 mg in sodium chloride 0.9 % 250 mL IVPB  Status:  Discontinued     1,250 mg 166.7 mL/hr over 90 Minutes Intravenous Every 24 hours 11/15/18 0243 11/19/18 1416   11/15/18 1200  cefTRIAXone (ROCEPHIN) 1 g in sodium chloride 0.9 % 100 mL IVPB  Status:  Discontinued     1 g 200 mL/hr over 30 Minutes Intravenous Every 24 hours 11/15/18 1034 11/15/18 1109   11/15/18 1200  metroNIDAZOLE (FLAGYL) IVPB 500 mg  Status:  Discontinued     500 mg 100 mL/hr over 60 Minutes Intravenous Every 8 hours 11/15/18 1034 11/17/18 1056   11/15/18 1200  cefTRIAXone (ROCEPHIN) 2 g in sodium chloride 0.9 % 100 mL IVPB  Status:  Discontinued     2 g 200 mL/hr over 30 Minutes Intravenous Every 24 hours 11/15/18 1109 11/19/18  1059   11/15/18 0245  vancomycin (VANCOCIN) 1,750 mg in sodium chloride 0.9 % 500 mL IVPB     1,750 mg 250 mL/hr over 120 Minutes Intravenous  Once 11/15/18 0235 11/15/18 0532   11/15/18 0230  piperacillin-tazobactam (ZOSYN) IVPB 3.375 g     3.375 g 100 mL/hr over 30 Minutes Intravenous  Once 11/15/18 0229 11/15/18 0327        (indicate start date, and stop date if known)  HPI/Subjective: Ordered covid testing for SNF purpose. No acute distress. Denies SOB. Able to walk in his room. No chest pains   Objective: Vitals:   11/22/18 0444 11/22/18 0829  BP: (!) 176/94 (!) 165/83  Pulse: 80 87  Resp: 16 16  Temp: 98.6 F (37 C) 98.1 F (36.7 C)  SpO2: 100% 100%    Intake/Output Summary (Last 24 hours) at 11/22/2018 1058 Last data filed at 11/22/2018 0900 Gross per 24 hour  Intake 600 ml  Output 650 ml  Net -50 ml   Filed Weights   11/15/18 0615  Weight: 88.5 kg    Exam:   General:  No distress   Cardiovascular: s1,s2 rrr  Respiratory: CTA BL   Abdomen: soft, nt   Musculoskeletal: wounds in LE    Data Reviewed: Basic Metabolic Panel: Recent Labs  Lab 11/16/18 0338 11/17/18 1241 11/18/18 0249 11/19/18 0650 11/20/18 0327 11/21/18 0447  NA 137 137  --  135 136 140  K 4.4 4.4  --  4.5 4.5 4.7  CL 97* 94*  --  94* 95* 95*  CO2 32 33*  --  28 33* 35*  GLUCOSE 210* 164*  --  69* 196* 112*  BUN 15 10  --  19 18 16   CREATININE 1.06 1.13 1.12 1.45* 1.38* 1.20  CALCIUM 8.3* 8.6*  --  8.5* 8.2* 8.6*  MG  --   --   --   --  1.7 1.8   Liver Function Tests: Recent Labs  Lab 11/16/18 0338 11/19/18 0650  AST 23 30  ALT 14 18  ALKPHOS 52 50  BILITOT 0.5 0.7  PROT 6.2* 6.9  ALBUMIN 2.4* 2.8*   No results for input(s): LIPASE, AMYLASE in the last 168 hours. No results for input(s): AMMONIA in the last 168 hours. CBC: Recent Labs  Lab 11/16/18 0338 11/17/18 1241 11/19/18 0650 11/20/18 0327  WBC 6.7 6.9 6.1 5.9  NEUTROABS  --  5.4  --   --   HGB 13.1 14.6 13.8 13.1  HCT 45.1 49.2 47.7 45.4  MCV 91.3 92.5 92.6 93.0  PLT 181 PLATELET CLUMPS NOTED ON SMEAR, UNABLE TO ESTIMATE 213 184   Cardiac Enzymes: No results for input(s): CKTOTAL, CKMB, CKMBINDEX, TROPONINI in the last 168 hours. BNP (last 3 results) No results for input(s): BNP in the last 8760 hours.  ProBNP (last 3  results) Recent Labs    12/02/17 1453  PROBNP 22    CBG: Recent Labs  Lab 11/21/18 0739 11/21/18 1137 11/21/18 1649 11/21/18 2209 11/22/18 0750  GLUCAP 98 177* 171* 210* 100*    Recent Results (from the past 240 hour(s))  Culture, blood (Routine x 2)     Status: None   Collection Time: 11/14/18  9:14 PM   Specimen: BLOOD LEFT HAND  Result Value Ref Range Status   Specimen Description BLOOD LEFT HAND  Final   Special Requests   Final    BOTTLES DRAWN AEROBIC AND ANAEROBIC Blood Culture results may not be optimal due  to an inadequate volume of blood received in culture bottles   Culture   Final    NO GROWTH 5 DAYS Performed at [facility], [place] [address] [place], [place] [address]    Report Status 11/19/2018 FINAL  Final  Culture, blood (Routine x 2)     Status: None   Collection Time: 11/15/18  1:09 AM   Specimen: BLOOD LEFT HAND  Result Value Ref Range Status   Specimen Description BLOOD LEFT HAND  Final   Special Requests   Final    BOTTLES DRAWN AEROBIC AND ANAEROBIC Blood Culture results may not be optimal due to an inadequate volume of blood received in culture bottles   Culture   Final    NO GROWTH 5 DAYS Performed at [facility], [place] [address] [place], [place] [address]    Report Status 11/20/2018 FINAL  Final  SARS Coronavirus 2 (CEPHEID - Performed in [place] hospital lab), Hosp Order     Status: None   Collection Time: 11/15/18  1:52 AM   Specimen: Nasopharyngeal Swab  Result Value Ref Range Status   SARS Coronavirus 2 NEGATIVE NEGATIVE Final    Comment: (NOTE) If result is NEGATIVE SARS-CoV-2 target nucleic acids are NOT DETECTED. The SARS-CoV-2 RNA is generally detectable in upper and lower  respiratory specimens during the acute phase of infection. The lowest  concentration of SARS-CoV-2 viral copies this assay can detect is 250  copies / mL. A negative result does not preclude SARS-CoV-2 infection  and should not be used as the  sole basis for treatment or other  patient management decisions.  A negative result may occur with  improper specimen collection / handling, submission of specimen other  than nasopharyngeal swab, presence of viral mutation(s) within the  areas targeted by this assay, and inadequate number of viral copies  (<250 copies / mL). A negative result must be combined with clinical  observations, patient history, and epidemiological information. If result is POSITIVE SARS-CoV-2 target nucleic acids are DETECTED. The SARS-CoV-2 RNA is generally detectable in upper and lower  respiratory specimens dur ing the acute phase of infection.  Positive  results are indicative of active infection with SARS-CoV-2.  Clinical  correlation with patient history and other diagnostic information is  necessary to determine patient infection status.  Positive results do  not rule out bacterial infection or co-infection with other viruses. If result is PRESUMPTIVE POSTIVE SARS-CoV-2 nucleic acids MAY BE PRESENT.   A presumptive positive result was obtained on the submitted specimen  and confirmed on repeat testing.  While 2019 novel coronavirus  (SARS-CoV-2) nucleic acids may be present in the submitted sample  additional confirmatory testing may be necessary for epidemiological  and / or clinical management purposes  to differentiate between  SARS-CoV-2 and other Sarbecovirus currently known to infect humans.  If clinically indicated additional testing with an alternate test  methodology [phone]) is advised. The SARS-CoV-2 RNA is generally  detectable in upper and lower respiratory sp ecimens during the acute  phase of infection. The expected result is Negative. Fact Sheet for Patients:  StrictlyIdeas.no Fact Sheet for Healthcare Providers: BankingDealers.co.za This test is not yet approved or cleared by the [place] FDA and has been authorized for detection  and/or diagnosis of SARS-CoV-2 by FDA under an Emergency Use Authorization (EUA).  This EUA will remain in effect (meaning this test can be used) for the duration of the COVID-19 declaration under Section 564(b)(1) of the  Act, 21 U.S.C. section 360bbb-3(b)(1), unless the authorization is terminated or revoked sooner. Performed at [facility], [address] [place], [place] [address]   MRSA PCR Screening     Status: None   Collection Time: 07/03/2[facility]  8:11 PM   Specimen: Nasal Mucosa; Nasopharyngeal  Result Value Ref Range Status   MRSA by PCR NEGATIVE NEGATIVE Final    Comment:        The GeneXpert MRSA Assay (FDA approved for NASAL specimens only), is one component of a comprehensive MRSA colonization surveillance program. It is not intended to diagnose MRSA infection nor to guide or monitor treatment for MRSA infections. Performed at [facility], [address] [place], [place] [address]      Studies: No results found.  Scheduled Meds: . atorvastatin  20 mg Oral q1800  . carvedilol  12.5 mg Oral BID WC  . doxycycline  100 mg Oral Q12H  . enoxaparin (LOVENOX) injection  40 mg Subcutaneous Q24H  . insulin aspart  0-15 Units Subcutaneous TID WC  . insulin glargine  10 Units Subcutaneous Daily  . pantoprazole  40 mg Oral Daily  . sodium chloride flush  3 mL Intravenous Once   Continuous Infusions:   Active Problems:   COPD, severe (HCC)   Diabetes (HCC)   Peripheral vascular disease (HCC)   Hyperlipidemia   CHF (congestive heart failure) (HCC)   Infection   Diabetic foot ulcers (HCC)    Time spent: >35 minutes     Esperanza SheetsBURIEV, Tawana Pasch N  [facility] Pager [phone]. If 7PM-7AM, please contact night-coverage at [email or web address], password [email or web address] 11/22/2018, 10:58 AM  LOS: 7 days

## 2018-11-22 NOTE — TOC Transition Note (Signed)
Transition of Care [facility]) - CM/SW Discharge Note   Patient Details  Name: ARAN MENNING MRN: [id number] Date of Birth: 1941-01-04  [facility]) CM/SW Contact:  Alberteen Sam, LCSW Phone Number: 11/22/2018, 4:21 PM   Clinical Narrative:     Patient will DC to: [facility] Anticipated DC date: 11/22/2018 Family notified: Hattie (spouse) Transport TO:[facility]  Per MD patient ready for DC to [facility]. RN, patient, patient's family, and facility notified of DC. Discharge Summary sent to facility. RN given number for report [phone] Room 126A. DC packet on chart. Ambulance transport requested for patient.  CSW signing off.  [place], [place]   Final next level of care: Skilled Nursing Facility Barriers to Discharge: No Barriers Identified   Patient Goals and CMS Choice Patient states their goals for this hospitalization and ongoing recovery are:: get better, CMS Medicare.gov Compare Post Acute Care list provided to:: Patient Represenative (must comment)(wife) Choice offered to / list presented to : Spouse  Discharge Placement PASRR number recieved: 11/21/18            Patient chooses bed at: [facility] Patient to be transferred to facility by: [facility] Name of family member notified: Georga Kaufmann (spouse) Patient and family notified of of transfer: 11/22/18  Discharge Plan and Services   Discharge Planning Services: CM Consult            DME Arranged: N/A         HH Arranged: NA          Social Determinants of Health ([place]) Interventions     Readmission Risk Interventions No flowsheet data found.

## 2018-11-22 NOTE — Discharge Summary (Addendum)
Physician Discharge Summary  Ivan Parks ZOX:[id number] DOB: 01/04/1941 DOA: 11/14/2018  PCP: Laurann MontanaWhite, [name], MD  Admit date: 11/14/2018 Discharge date: 11/22/2018  Time spent: >35 minutes  Recommendations for Outpatient Follow-up:  MD at the facility  Wound care follow up  Discharge Diagnoses:  Active Problems:   COPD, severe (HCC)   Diabetes (HCC)   Peripheral vascular disease (HCC)   Hyperlipidemia   CHF (congestive heart failure) (HCC)   Infection   Diabetic foot ulcers (HCC)   Discharge Condition: stable   Diet recommendation: carb modified  Filed Weights   11/15/18 0615  Weight: 88.5 kg    History of present illness:   78 year old male with history of poorly controlled DM-2, diastolic CHF, COPD, HTN, HLD, PVD and chronic venous stasis reportedly followed by wound care admitted on 11/15/18 with bilateral lower extremity cellulitis and Magots. No constitutional symptoms or signs of systemic infection. Started on vancomycin, Flagyl and ceftriaxone. X-ray and MRI without osteomyelitis. ABI with mild PVD of right LE.   See individual problem list below for more  Hospital Course:   Bilateral lower extremity ulcers with cellulitis. Lymphedema/chronic venous stasis. Osteomyelitis excluded by x-ray and MRI. ABI with mild right lower extremity PVD. Blood cultures, MRSA PCR negative. Treated with Flagyl 6/30-7/2. Vancomycin and ceftriaxone 6/30-7/4. Ceftazidime 7/4--7/7. Then transitioned to doxy for 2 days. Cont wound care   Hypertension/tachycardia:Discontinued home lisinoprildue to AKI. started Coreg , adjust as needed.  IDDM-2 with hyper and hypoglycemia: A1c 8.1%. stable on levemir + ISS. Titrate as needed  AKI: Serum creatininepeaked at 1.45 and down to 1.38.BUN 19. hold  lisinopril and vancomycin contribute  Chronic diastolic CHF: EF 60 to 65% in 8/11918/2019. Appears euvolemic. Resumed home Lasix.   Chronic COPD: On 2 L by [place] at baseline  Generalized  weakness/LLE weakness/debility:Neuro exam significant for LLE weakness compared to right and a symmetric patellar reflex. Reportedly chronic per patient. PT recommended SNF.   Social: Patient lives with his elderly wife and found to have Magod's in his toes/foot. PT recommended SNF.   History of noncompliance: Reportedly noncompliant with medications, oxygen and lymphedema device at home per discussion between previous provider and family. -Would benefit from SNF where he can get close supervision.  Delirium: reportedly had waxing or waning MS. Probable underlying mild congnitive insufficiency. Delirium precautions  Procedures:  none (i.e. Studies not automatically included, echos, thoracentesis, etc; not x-rays)  Consultations:  none  Discharge Exam: Vitals:   11/22/18 0829 11/22/18 1549  BP: (!) 165/83 (!) 136/91  Pulse: 87 76  Resp: 16 16  Temp: 98.1 F (36.7 C) 98 F (36.7 C)  SpO2: 100% 100%    General: no acute distress  Cardiovascular: s1,s2 rrr Respiratory: CTA BL  Discharge Instructions  Discharge Instructions    Diet - low sodium heart healthy   Complete by: As directed    Increase activity slowly   Complete by: As directed      Allergies as of 11/22/2018   No Known Allergies     Medication List    STOP taking these medications   glucose blood test strip Commonly known as: OneTouch Verio   insulin NPH Human 100 UNIT/ML injection Commonly known as: NovoLIN N ReliOn   insulin regular 100 units/mL injection Commonly known as: NovoLIN R ReliOn   Insulin Syringe-Needle U-100 31G X 5/16" 1 ML Misc Commonly known as: RELION INSULIN SYRINGE 1ML/31G   lisinopril 20 MG tablet Commonly known as: ZESTRIL   OneTouch Delica Lancets  33G Misc     TAKE these medications   acetaminophen 325 MG tablet Commonly known as: TYLENOL Take 2 tablets (650 mg total) by mouth every 6 (six) hours as needed for mild pain (or Fever >/= 101).   albuterol (2.5  MG/3ML) 0.083% nebulizer solution Commonly known as: PROVENTIL Take 3 mLs (2.5 mg total) by nebulization every 6 (six) hours as needed for wheezing or shortness of breath.   atorvastatin 20 MG tablet Commonly known as: LIPITOR Take 1 tablet (20 mg total) by mouth daily at 6 PM.   carvedilol 12.5 MG tablet Commonly known as: COREG Take 1 tablet (12.5 mg total) by mouth 2 (two) times daily with a meal.   doxycycline 100 MG tablet Commonly known as: VIBRA-TABS Take 1 tablet (100 mg total) by mouth every 12 (twelve) hours for 4 days.   furosemide 40 MG tablet Commonly known as: LASIX Take 1 tablet (40 mg total) by mouth daily.   insulin aspart 100 UNIT/ML injection Commonly known as: novoLOG Inject 0-15 Units into the skin 3 (three) times daily with meals. Insulin sliding scale   insulin glargine 100 UNIT/ML injection Commonly known as: LANTUS Inject 0.1 mLs (10 Units total) into the skin daily. Start taking on: November 23, 2018   pantoprazole 40 MG tablet Commonly known as: PROTONIX Take 1 tablet (40 mg total) by mouth daily.   Tiotropium Bromide-Olodaterol 2.5-2.5 MCG/ACT Aers Commonly known as: Stiolto Respimat Inhale 2 puffs into the lungs daily. What changed: Another medication with the same name was removed. Continue taking this medication, and follow the directions you see here.      No Known Allergies    The results of significant diagnostics from this hospitalization (including imaging, microbiology, ancillary and laboratory) are listed below for reference.    Significant Diagnostic Studies: Mr Foot Left Wo Contrast  Result Date: 11/15/2018 CLINICAL DATA:  Diffuse foot swelling.  Diabetic. EXAM: MRI OF THE LEFT FOOT WITHOUT CONTRAST TECHNIQUE: Multiplanar, multisequence MR imaging of the left foot was performed. No intravenous contrast was administered. COMPARISON:  None. FINDINGS: Diffuse and fairly marked subcutaneous soft tissue swelling/edema/fluid. No discrete  fluid collection to suggest a drainable soft tissue abscess. There is also diffuse myofasciitis but no definite MR findings without contrast to suggest pyomyositis. No findings suspicious for septic arthritis or osteomyelitis. IMPRESSION: 1. Diffuse and fairly marked subcutaneous soft tissue swelling/edema/fluid but no discrete drainable abscess is identified without contrast. 2. Diffuse myositis without definite findings for pyomyositis. 3. No MR findings suspicious for septic arthritis or osteomyelitis. Electronically Signed   By: Rudie Meyer M.D.   On: 11/15/2018 20:39   Mr Foot Right W Wo Contrast  Result Date: 11/15/2018 CLINICAL DATA:  Foot pain and swelling.  Diabetic. EXAM: MRI OF THE RIGHT FOREFOOT WITHOUT AND WITH CONTRAST TECHNIQUE: Multiplanar, multisequence MR imaging of the right foot was performed before and after the administration of intravenous contrast. CONTRAST:  8 cc Gadavist COMPARISON:  Radiographs 11/14/2018 FINDINGS: Diffuse subcutaneous soft tissue swelling/edema/fluid suggesting cellulitis. No rim enhancing fluid collection to suggest a drainable soft tissue abscess. Mild diffuse myositis but no findings for pyomyositis. No findings suspicious for septic arthritis or osteomyelitis. The major tendons and ligaments appear intact. IMPRESSION: 1. Diffuse cellulitis but no soft tissue abscess. 2. Myositis without findings for pyomyositis. 3. No MR findings suspicious for septic arthritis or osteomyelitis. Electronically Signed   By: Rudie Meyer M.D.   On: 11/15/2018 20:44   Dg Foot Complete Right  Result  Date: 11/14/2018 CLINICAL DATA:  Diabetic wound, right great toe EXAM: RIGHT FOOT COMPLETE - 3+ VIEW COMPARISON:  None. FINDINGS: No fracture or dislocation of the right foot. There is mild right first metatarsophalangeal arthrosis. There is extensive, diffuse soft tissue edema about the included right foot and ankle. There is no bony erosion or sclerosis to suggest osteomyelitis.  IMPRESSION: No fracture or dislocation of the right foot. There is mild right first metatarsophalangeal arthrosis. There is extensive, diffuse soft tissue edema about the included right foot and ankle. There is no bony erosion or sclerosis to suggest osteomyelitis. Please note that MRI is more sensitive for the evaluation of marrow edema and osteomyelitis if suspected. Electronically Signed   By: Lauralyn PrimesAlex  Bibbey M.D.   On: 11/14/2018 21:55    Microbiology: Recent Results (from the past 240 hour(s))  Culture, blood (Routine x 2)     Status: None   Collection Time: 11/14/18  9:14 PM   Specimen: BLOOD LEFT HAND  Result Value Ref Range Status   Specimen Description BLOOD LEFT HAND  Final   Special Requests   Final    BOTTLES DRAWN AEROBIC AND ANAEROBIC Blood Culture results may not be optimal due to an inadequate volume of blood received in culture bottles   Culture   Final    NO GROWTH 5 DAYS Performed at [facility], [address] [place], [place] [address]    Report Status 11/19/2018 FINAL  Final  Culture, blood (Routine x 2)     Status: None   Collection Time: 11/15/18  1:09 AM   Specimen: BLOOD LEFT HAND  Result Value Ref Range Status   Specimen Description BLOOD LEFT HAND  Final   Special Requests   Final    BOTTLES DRAWN AEROBIC AND ANAEROBIC Blood Culture results may not be optimal due to an inadequate volume of blood received in culture bottles   Culture   Final    NO GROWTH 5 DAYS Performed at [facility], [address] [place], [place] [address]    Report Status 11/20/2018 FINAL  Final  SARS Coronavirus 2 (CEPHEID - Performed in [facility] hospital lab), Hosp Order     Status: None   Collection Time: 11/15/18  1:52 AM   Specimen: Nasopharyngeal Swab  Result Value Ref Range Status   SARS Coronavirus 2 NEGATIVE NEGATIVE Final    Comment: (NOTE) If result is NEGATIVE SARS-CoV-2 target nucleic acids are NOT DETECTED. The SARS-CoV-2 RNA is generally detectable  in upper and lower  respiratory specimens during the acute phase of infection. The lowest  concentration of SARS-CoV-2 viral copies this assay can detect is 250  copies / mL. A negative result does not preclude SARS-CoV-2 infection  and should not be used as the sole basis for treatment or other  patient management decisions.  A negative result may occur with  improper specimen collection / handling, submission of specimen other  than nasopharyngeal swab, presence of viral mutation(s) within the  areas targeted by this assay, and inadequate number of viral copies  (<250 copies / mL). A negative result must be combined with clinical  observations, patient history, and epidemiological information. If result is POSITIVE SARS-CoV-2 target nucleic acids are DETECTED. The SARS-CoV-2 RNA is generally detectable in upper and lower  respiratory specimens dur ing the acute phase of infection.  Positive  results are indicative of active infection with SARS-CoV-2.  Clinical  correlation with patient history and other diagnostic information is  necessary to determine patient infection status.  Positive results do  not rule out bacterial infection or co-infection with other viruses. If result is PRESUMPTIVE POSTIVE SARS-CoV-2 nucleic acids MAY BE PRESENT.   A presumptive positive result was obtained on the submitted specimen  and confirmed on repeat testing.  While 2019 novel coronavirus  (SARS-CoV-2) nucleic acids may be present in the submitted sample  additional confirmatory testing may be necessary for epidemiological  and / or clinical management purposes  to differentiate between  SARS-CoV-2 and other Sarbecovirus currently known to infect humans.  If clinically indicated additional testing with an alternate test  methodology [phone]([id number]) is advised. The SARS-CoV-2 RNA is generally  detectable in upper and lower respiratory sp ecimens during the acute  phase of infection. The expected result is  Negative. Fact Sheet for Patients:  BoilerBrush.com.cyhttps://www.fda.gov/media/136312/download Fact Sheet for Healthcare Providers: https://pope.com/https://www.fda.gov/media/136313/download This test is not yet approved or cleared by the [place] FDA and has been authorized for detection and/or diagnosis of SARS-CoV-2 by FDA under an Emergency Use Authorization (EUA).  This EUA will remain in effect (meaning this test can be used) for the duration of the COVID-19 declaration under Section 564(b)(1) of the Act, 21 U.S.C. section 360bbb-3(b)(1), unless the authorization is terminated or revoked sooner. Performed at [facility], [address] [place], [place] [address]   MRSA PCR Screening     Status: None   Collection Time: 11/18/18  8:11 PM   Specimen: Nasal Mucosa; Nasopharyngeal  Result Value Ref Range Status   MRSA by PCR NEGATIVE NEGATIVE Final    Comment:        The GeneXpert MRSA Assay (FDA approved for NASAL specimens only), is one component of a comprehensive MRSA colonization surveillance program. It is not intended to diagnose MRSA infection nor to guide or monitor treatment for MRSA infections. Performed at [facility], [address] [place], [place] [address]   SARS Coronavirus 2 (CEPHEID - Performed in [facility] hospital lab), Hosp Order     Status: None   Collection Time: 11/22/18 11:18 AM   Specimen: Nasopharyngeal Swab  Result Value Ref Range Status   SARS Coronavirus 2 NEGATIVE NEGATIVE Final    Comment: (NOTE) If result is NEGATIVE SARS-CoV-2 target nucleic acids are NOT DETECTED. The SARS-CoV-2 RNA is generally detectable in upper and lower  respiratory specimens during the acute phase of infection. The lowest  concentration of SARS-CoV-2 viral copies this assay can detect is 250  copies / mL. A negative result does not preclude SARS-CoV-2 infection  and should not be used as the sole basis for treatment or other  patient management decisions.  A negative result  may occur with  improper specimen collection / handling, submission of specimen other  than nasopharyngeal swab, presence of viral mutation(s) within the  areas targeted by this assay, and inadequate number of viral copies  (<250 copies / mL). A negative result must be combined with clinical  observations, patient history, and epidemiological information. If result is POSITIVE SARS-CoV-2 target nucleic acids are DETECTED. The SARS-CoV-2 RNA is generally detectable in upper and lower  respiratory specimens dur ing the acute phase of infection.  Positive  results are indicative of active infection with SARS-CoV-2.  Clinical  correlation with patient history and other diagnostic information is  necessary to determine patient infection status.  Positive results do  not rule out bacterial infection or co-infection with other viruses. If result is PRESUMPTIVE POSTIVE SARS-CoV-2 nucleic  acids MAY BE PRESENT.   A presumptive positive result was obtained on the submitted specimen  and confirmed on repeat testing.  While 2019 novel coronavirus  (SARS-CoV-2) nucleic acids may be present in the submitted sample  additional confirmatory testing may be necessary for epidemiological  and / or clinical management purposes  to differentiate between  SARS-CoV-2 and other Sarbecovirus currently known to infect humans.  If clinically indicated additional testing with an alternate test  methodology [phone]) is advised. The SARS-CoV-2 RNA is generally  detectable in upper and lower respiratory sp ecimens during the acute  phase of infection. The expected result is Negative. Fact Sheet for Patients:  StrictlyIdeas.no Fact Sheet for Healthcare Providers: BankingDealers.co.za This test is not yet approved or cleared by the [place] FDA and has been authorized for detection and/or diagnosis of SARS-CoV-2 by FDA under an Emergency Use Authorization (EUA).   This EUA will remain in effect (meaning this test can be used) for the duration of the COVID-19 declaration under Section 564(b)(1) of the Act, 21 U.S.C. section 360bbb-3(b)(1), unless the authorization is terminated or revoked sooner. Performed at [facility], [place] [address], [place], [place] [address]      Labs: Basic Metabolic Panel: Recent Labs  Lab 11/16/18 [phone] 11/17/18 1241 11/18/18 0249 11/19/18 0650 11/20/18 0327 11/21/18 0447  NA 137 137  --  135 136 140  K 4.4 4.4  --  4.5 4.5 4.7  CL 97* 94*  --  94* 95* 95*  CO2 32 33*  --  28 33* 35*  GLUCOSE 210* 164*  --  69* 196* 112*  BUN 15 10  --  19 18 16   CREATININE 1.06 1.13 1.12 1.45* 1.38* 1.20  CALCIUM 8.3* 8.6*  --  8.5* 8.2* 8.6*  MG  --   --   --   --  1.7 1.8   Liver Function Tests: Recent Labs  Lab 11/16/18 0338 11/19/18 0650  AST 23 30  ALT 14 18  ALKPHOS 52 50  BILITOT 0.5 0.7  PROT 6.2* 6.9  ALBUMIN 2.4* 2.8*   No results for input(s): LIPASE, AMYLASE in the last 168 hours. No results for input(s): AMMONIA in the last 168 hours. CBC: Recent Labs  Lab 11/16/18 0338 11/17/18 1241 11/19/18 0650 11/20/18 0327  WBC 6.7 6.9 6.1 5.9  NEUTROABS  --  5.4  --   --   HGB 13.1 14.6 13.8 13.1  HCT 45.1 49.2 47.7 45.4  MCV 91.3 92.5 92.6 93.0  PLT 181 PLATELET CLUMPS NOTED ON SMEAR, UNABLE TO ESTIMATE 213 184   Cardiac Enzymes: No results for input(s): CKTOTAL, CKMB, CKMBINDEX, TROPONINI in the last 168 hours. BNP: BNP (last 3 results) No results for input(s): BNP in the last 8760 hours.  ProBNP (last 3 results) Recent Labs    12/02/17 1453  PROBNP 22    CBG: Recent Labs  Lab 11/21/18 1137 11/21/18 1649 11/21/18 2209 11/22/18 0750 11/22/18 1213  GLUCAP 177* 171* 210* 100* 144*       Signed:  Dhanvin Szeto N  [facility] 11/22/2018, 4:05 PM

## 2018-11-22 NOTE — Progress Notes (Signed)
Physical Therapy Treatment Patient Details Name: Ivan MinisterLarry L Clouse MRN: [id number] DOB: Oct 25, 1940 Today's Date: 11/22/2018    History of Present Illness 78 yo male with B foot maggots and cellulitis/myositis was admitted and cleared for joint sepsis and osteomyelitis.  PT for mobility assessment.  PMHx:  DM, CHF, COPD, HTN, PVD, O2 at home 2.5 L    PT Comments    Patient progressing well towards PT goals. Pt very irritated upon PT arrival, not understanding why therapist was there and timing of everything, upset about hospital course and not being able to get up. Provided comfort and lending ear which allowed this PT to build rapport. Tolerated gait training with RW for safety. Sp02 dropped to 86% on 3L/min 02 with standing rest breaks needed. Cues for pursed lip breathing. Will continue to follow.    Follow Up Recommendations  SNF     Equipment Recommendations  None recommended by PT    Recommendations for Other Services       Precautions / Restrictions Precautions Precautions: Fall Restrictions Weight Bearing Restrictions: Yes Other Position/Activity Restrictions: WBAT    Mobility  Bed Mobility               General bed mobility comments: pt received in chair upon arrival.   Transfers Overall transfer level: Needs assistance Equipment used: Rolling walker (2 wheeled) Transfers: Sit to/from Stand Sit to Stand: Supervision         General transfer comment: Supervision for safety. Stood from Orthoptistchair x1.  Ambulation/Gait Ambulation/Gait assistance: Supervision Gait Distance (Feet): 60 Feet Assistive device: Rolling walker (2 wheeled) Gait Pattern/deviations: Wide base of support;Decreased stride length;Step-through pattern;Decreased step length - right;Decreased step length - left     General Gait Details: Slow, mostly steady gait with a few standing rest breaks. Sp02 dropped to 86% on 3L/min 02. Recovered quickly.   Stairs             Wheelchair  Mobility    Modified Rankin (Stroke Patients Only)       Balance Overall balance assessment: Needs assistance Sitting-balance support: Feet supported;No upper extremity supported Sitting balance-Leahy Scale: Good     Standing balance support: During functional activity Standing balance-Leahy Scale: Poor Standing balance comment: Reliant on BUEs on RW.                            Cognition Arousal/Alertness: Awake/alert Behavior During Therapy: (irritated) Overall Cognitive Status: No family/caregiver present to determine baseline cognitive functioning                                 General Comments: Pt irritated about hspital course, not being able to get up and move, people not listening to him etc. Provided listening ear and comfort and thena ble to build rapport. Does not remember therapist coming in ever, "why is the doc sending someone in now when i have been here for 5 days?"      Exercises      General Comments        Pertinent Vitals/Pain Pain Assessment: No/denies pain    Home Living                      Prior Function            PT Goals (current goals can now be found in the care plan section) Progress towards  PT goals: Progressing toward goals    Frequency    Min 2X/week      PT Plan Current plan remains appropriate    Co-evaluation              AM-PAC PT "6 Clicks" Mobility   Outcome Measure  Help needed turning from your back to your side while in a flat bed without using bedrails?: None Help needed moving from lying on your back to sitting on the side of a flat bed without using bedrails?: None Help needed moving to and from a bed to a chair (including a wheelchair)?: A Little Help needed standing up from a chair using your arms (e.g., wheelchair or bedside chair)?: A Little Help needed to walk in hospital room?: A Little Help needed climbing 3-5 steps with a railing? : A Lot 6 Click Score:  19    End of Session Equipment Utilized During Treatment: Oxygen Activity Tolerance: Patient tolerated treatment well Patient left: in chair;with call bell/phone within reach Nurse Communication: Mobility status PT Visit Diagnosis: Unsteadiness on feet (R26.81);Muscle weakness (generalized) (M62.81);Difficulty in walking, not elsewhere classified (R26.2)     Time: 7622-6333 PT Time Calculation (min) (ACUTE ONLY): 32 min  Charges:  $Gait Training: 8-22 mins $Self Care/Home Management: 8-22                     Wray Kearns, PT, DPT Acute Rehabilitation Services Pager [phone] Office [place] 11/22/2018, 3:52 PM

## 2018-11-22 NOTE — Plan of Care (Signed)
  Problem: Activity: Goal: Risk for activity intolerance will decrease Outcome: Progressing   Problem: Nutrition: Goal: Adequate nutrition will be maintained Outcome: Progressing   Problem: Pain Managment: Goal: General experience of comfort will improve Outcome: Progressing   

## 2018-12-14 ENCOUNTER — Emergency Department (HOSPITAL_COMMUNITY)
Admission: EM | Admit: 2018-12-14 | Discharge: 2018-12-15 | Disposition: A | Discharge status: Home / Self Care | Payer: Medicare Other | Attending: Emergency Medicine | Admitting: Emergency Medicine

## 2018-12-14 ENCOUNTER — Emergency Department (HOSPITAL_COMMUNITY): Payer: Medicare Other

## 2018-12-14 ENCOUNTER — Encounter (HOSPITAL_COMMUNITY): Payer: Self-pay

## 2018-12-14 ENCOUNTER — Other Ambulatory Visit: Payer: Self-pay

## 2018-12-14 DIAGNOSIS — Z87891 Personal history of nicotine dependence: Secondary | ICD-10-CM | POA: Insufficient documentation

## 2018-12-14 DIAGNOSIS — Z79899 Other long term (current) drug therapy: Secondary | ICD-10-CM | POA: Insufficient documentation

## 2018-12-14 DIAGNOSIS — J449 Chronic obstructive pulmonary disease, unspecified: Secondary | ICD-10-CM | POA: Insufficient documentation

## 2018-12-14 DIAGNOSIS — Z794 Long term (current) use of insulin: Secondary | ICD-10-CM | POA: Diagnosis not present

## 2018-12-14 DIAGNOSIS — Z9981 Dependence on supplemental oxygen: Secondary | ICD-10-CM | POA: Diagnosis not present

## 2018-12-14 DIAGNOSIS — I110 Hypertensive heart disease with heart failure: Secondary | ICD-10-CM | POA: Diagnosis not present

## 2018-12-14 DIAGNOSIS — E119 Type 2 diabetes mellitus without complications: Secondary | ICD-10-CM | POA: Diagnosis not present

## 2018-12-14 DIAGNOSIS — R0602 Shortness of breath: Secondary | ICD-10-CM | POA: Diagnosis not present

## 2018-12-14 DIAGNOSIS — I509 Heart failure, unspecified: Secondary | ICD-10-CM | POA: Insufficient documentation

## 2018-12-14 LAB — COMPREHENSIVE METABOLIC PANEL
ALT: 56 U/L — ABNORMAL HIGH (ref 0–44)
AST: 78 U/L — ABNORMAL HIGH (ref 15–41)
Albumin: 3.1 g/dL — ABNORMAL LOW (ref 3.5–5.0)
Alkaline Phosphatase: 77 U/L (ref 38–126)
Anion gap: 9 (ref 5–15)
BUN: 20 mg/dL (ref 8–23)
CO2: 29 mmol/L (ref 22–32)
Calcium: 8.7 mg/dL — ABNORMAL LOW (ref 8.9–10.3)
Chloride: 101 mmol/L (ref 98–111)
Creatinine, Ser: 1.35 mg/dL — ABNORMAL HIGH (ref 0.61–1.24)
GFR calc Af Amer: 58 mL/min — ABNORMAL LOW (ref >60)
GFR calc non Af Amer: 50 mL/min — ABNORMAL LOW (ref >60)
Glucose, Bld: 195 mg/dL — ABNORMAL HIGH (ref 70–99)
Potassium: 4.4 mmol/L (ref 3.5–5.1)
Sodium: 139 mmol/L (ref 135–145)
Total Bilirubin: 0.5 mg/dL (ref 0.3–1.2)
Total Protein: 7.7 g/dL (ref 6.5–8.1)

## 2018-12-14 LAB — POCT I-STAT EG7
Acid-Base Excess: 5 mmol/L — ABNORMAL HIGH (ref 0.0–2.0)
Bicarbonate: 33.8 mmol/L — ABNORMAL HIGH (ref 20.0–28.0)
Calcium, Ion: 1.12 mmol/L — ABNORMAL LOW (ref 1.15–1.40)
HCT: 45 % (ref 39.0–52.0)
Hemoglobin: 15.3 g/dL (ref 13.0–17.0)
O2 Saturation: 70 %
Potassium: 4.3 mmol/L (ref 3.5–5.1)
Sodium: 140 mmol/L (ref 135–145)
TCO2: 36 mmol/L — ABNORMAL HIGH (ref 22–32)
pCO2, Ven: 69.8 mmHg — ABNORMAL HIGH (ref 44.0–60.0)
pH, Ven: 7.292 (ref 7.250–7.430)
pO2, Ven: 42 mmHg (ref 32.0–45.0)

## 2018-12-14 LAB — CBC WITH DIFFERENTIAL/PLATELET
Abs Immature Granulocytes: 0.01 10*3/uL (ref 0.00–0.07)
Basophils Absolute: 0 10*3/uL (ref 0.0–0.1)
Basophils Relative: 0 %
Eosinophils Absolute: 0.2 10*3/uL (ref 0.0–0.5)
Eosinophils Relative: 4 %
HCT: 47.5 % (ref 39.0–52.0)
Hemoglobin: 13.3 g/dL (ref 13.0–17.0)
Immature Granulocytes: 0 %
Lymphocytes Relative: 17 %
Lymphs Abs: 1 10*3/uL (ref 0.7–4.0)
MCH: 26.5 pg (ref 26.0–34.0)
MCHC: 28 g/dL — ABNORMAL LOW (ref 30.0–36.0)
MCV: 94.8 fL (ref 80.0–100.0)
Monocytes Absolute: 0.4 10*3/uL (ref 0.1–1.0)
Monocytes Relative: 6 %
Neutro Abs: 4.3 10*3/uL (ref 1.7–7.7)
Neutrophils Relative %: 73 %
Platelets: 162 10*3/uL (ref 150–400)
RBC: 5.01 MIL/uL (ref 4.22–5.81)
RDW: 12.8 % (ref 11.5–15.5)
WBC: 6 10*3/uL (ref 4.0–10.5)
nRBC: 0 % (ref 0.0–0.2)

## 2018-12-14 LAB — TROPONIN I (HIGH SENSITIVITY): Troponin I (High Sensitivity): 18 ng/L — ABNORMAL HIGH (ref <18)

## 2018-12-14 LAB — BRAIN NATRIURETIC PEPTIDE: B Natriuretic Peptide: 36.2 pg/mL (ref 0.0–100.0)

## 2018-12-14 MED ORDER — ALBUTEROL SULFATE HFA 108 (90 BASE) MCG/ACT IN AERS
2.0000 | INHALATION_SPRAY | Freq: Once | RESPIRATORY_TRACT | Status: AC
  Administered 2018-12-14: 22:00:00 2 via RESPIRATORY_TRACT
  Filled 2018-12-14: qty 6.7

## 2018-12-14 NOTE — ED Triage Notes (Signed)
Pt came in by EMS with increased SHOB x1 day.  Denies any pain.

## 2018-12-14 NOTE — Discharge Instructions (Signed)
Please talk with your doctor if your shortness of breath does not improve and make sure that your oxygen tanks and tubing are working correctly.  We hope that you continue to feel better.

## 2018-12-14 NOTE — ED Provider Notes (Signed)
[facility] Provider Note   CSN: [id number] Arrival date & time: 12/14/18  2029    History   Chief Complaint No chief complaint on file.   HPI Ivan Parks is a 78 y.o. male with a PMH significant for COPD, OSA, CHF, type 2 diabetes, and hypertension who presents with shortness of breath.  Patient reports that over the past 2 weeks, he has felt short of breath intermittently and has increased his home oxygen from his normal 2.5 L to 3 L.  The patient is a poor historian, so he cannot tell me what level of activity causes his shortness of breath.  He denies recent increase in edema, cough, fever, weakness, chest pain, abdominal pain, and increased sputum production.  He does not currently take an inhaler, but he says he is taking all of his other medications.  According to patient's wife, the patient was feeling well recently but felt like he was not getting enough oxygen through his oxygen tank.  She thinks that he was having trouble hooking up his O2 tubing correctly.     Past Medical History:  Diagnosis Date  . Acute respiratory failure (HCC)   . AKI (acute kidney injury) (HCC) 04/01/2016  . Bilateral lower extremity edema   . Bronchitis   . Chest pain 12/02/2017  . CHF (congestive heart failure) (HCC)   . Chronic respiratory failure (HCC) 11/26/2014  . COPD (chronic obstructive pulmonary disease) (HCC)   . 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 Diabetes (HCC) 05/02/2016  . DM (diabetes mellitus) (HCC)   . Essential hypertension 03/06/2015   hypertension   . HTN (hypertension)   . Hyperlipidemia   . Hypotension 03/31/2016  . Hypoxemia   . Leg edema, right- Greater than Left leg 07/05/2014   Lower extremity edema   . OSA (obstructive sleep apnea)   . Peripheral vascular disease (HCC)   . Tobacco  abuse     Patient Active Problem List   Diagnosis Date Noted  . Infection 11/15/2018  . 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 Chest pain 12/02/2017  . Peripheral vascular disease (HCC)   . Hyperlipidemia   . CHF (congestive heart failure) (HCC)   . Diabetes (HCC) 05/02/2016  . AKI (acute kidney injury) (HCC) 04/01/2016  . Hypotension 03/31/2016  . Essential hypertension 03/06/2015  . Chronic respiratory failure (HCC) 11/26/2014  . Leg edema, right- Greater than Left leg 07/05/2014  . Allergic rhinitis 11/09/2012  . 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 COLON SURGERY          Home Medications    Prior to Admission medications   Medication Sig Start Date End Date Taking? Authorizing Provider  acetaminophen (TYLENOL) 325 MG tablet Take 2 tablets (650 mg total) by mouth every 6 (six) hours as needed for mild pain (or Fever >/= 101). 11/22/18   Buriev, Isaiah SergeUlugbek N, MD  albuterol (PROVENTIL) (2.5 MG/3ML) 0.083% nebulizer solution Take 3 mLs (2.5 mg total) by nebulization every 6 (six) hours as needed for wheezing or shortness of breath. Patient not taking: Reported on 11/15/2018 07/30/17   Lupita LeashMcQuaid, Douglas B, MD  atorvastatin (LIPITOR) 20 MG tablet Take 1 tablet (20 mg total) by mouth daily at 6 PM. 11/22/18  Esperanza SheetsBuriev, Ulugbek N, MD  carvedilol (COREG) 12.5 MG tablet Take 1 tablet (12.5 mg total) by mouth 2 (two) times daily with a meal. 11/22/18   Buriev, Isaiah SergeUlugbek N, MD  furosemide (LASIX) 40 MG tablet Take 1 tablet (40 mg total) by mouth daily. 12/02/17   Quintella Reicherturner, Traci R, MD  insulin aspart (NOVOLOG) 100 UNIT/ML injection Inject 0-15 Units into the skin 3 (three) times daily with meals. Insulin sliding scale 11/22/18   Buriev, Isaiah SergeUlugbek N, MD  insulin glargine (LANTUS) 100 UNIT/ML injection Inject 0.1 mLs (10 Units total) into the skin daily. 11/23/18   Esperanza SheetsBuriev, Ulugbek N, MD  pantoprazole (PROTONIX) 40 MG tablet Take 1 tablet (40 mg total) by mouth  daily. 12/17/17   Quintella Reicherturner, Traci R, MD  Tiotropium Bromide-Olodaterol (STIOLTO RESPIMAT) 2.5-2.5 MCG/ACT AERS Inhale 2 puffs into the lungs daily. Patient not taking: Reported on 11/15/2018 06/24/16   Leslye PeerByrum, Robert S, MD    Family History Family History  Problem Relation Age of Onset  . Diabetes Mother   . Hypertension Mother   . Peripheral vascular disease Mother        amputation  . Diabetes Father        Right Leg Amputation-Gangrene  . Hypertension Father   . Peripheral vascular disease Father        amputation  . Cancer Father        Lead Poison-Ca  . Pneumonia Father   . Diabetes Sister   . Hypertension Sister   . Diabetes Daughter   . Hypertension Daughter     Social History Social History   Tobacco Use  . Smoking status: Former Smoker    Packs/day: 1.50    Years: 50.00    Pack years: 75.00    Types: Cigarettes    Quit date: 11/04/2010    Years since quitting: 8.1  . Smokeless tobacco: Never Used  Substance Use Topics  . Alcohol use: No    Alcohol/week: 0.0 standard drinks  . Drug use: No     Allergies   Patient has no known allergies.   Review of Systems Review of Systems  Constitutional: Negative for activity change, appetite change, chills, fatigue and fever.  HENT: Negative for congestion and rhinorrhea.   Respiratory: Positive for shortness of breath. Negative for cough.   Cardiovascular: Negative for chest pain.  Gastrointestinal: Negative for abdominal pain.  Musculoskeletal: Negative for back pain.  Neurological: Negative for weakness.     Physical Exam Updated Vital Signs BP (!) 145/78   Pulse 87   Temp 98 F (36.7 C) (Oral)   Resp (!) 21   Ht 5\' 5"  (1.651 m)   Wt 93 kg   SpO2 100%   BMI 34.11 kg/m   Physical Exam Constitutional:      General: He is not in acute distress.    Appearance: He is obese. He is not ill-appearing.  HENT:     Head: Normocephalic and atraumatic.     Right Ear: External ear normal.     Left Ear: External  ear normal.     Nose: Nose normal. No congestion or rhinorrhea.     Mouth/Throat:     Mouth: Mucous membranes are moist.     Pharynx: No oropharyngeal exudate or posterior oropharyngeal erythema.  Eyes:     Extraocular Movements: Extraocular movements intact.     Conjunctiva/sclera: Conjunctivae normal.  Neck:     Musculoskeletal: Normal range of motion.  Cardiovascular:     Rate and Rhythm:  Normal rate and regular rhythm.     Pulses: Normal pulses.     Heart sounds: Normal heart sounds.  Pulmonary:     Effort: Pulmonary effort is normal. No respiratory distress.     Breath sounds: No wheezing, rhonchi or rales.     Comments: Poor air movement Abdominal:     General: Abdomen is flat. Bowel sounds are normal.     Palpations: Abdomen is soft.     Tenderness: There is no abdominal tenderness.  Musculoskeletal: Normal range of motion.     Right lower leg: Edema present.     Left lower leg: Edema present.  Skin:    General: Skin is warm and dry.  Neurological:     General: No focal deficit present.     Mental Status: He is alert.  Psychiatric:        Mood and Affect: Mood normal.        Behavior: Behavior normal.      ED Treatments / Results  Labs (all labs ordered are listed, but only abnormal results are displayed) Labs Reviewed  CBC WITH DIFFERENTIAL/PLATELET - Abnormal; Notable for the following components:      Result Value   MCHC 28.0 (*)    All other components within normal limits  COMPREHENSIVE METABOLIC PANEL - Abnormal; Notable for the following components:   Glucose, Bld 195 (*)    Creatinine, Ser 1.35 (*)    Calcium 8.7 (*)    Albumin 3.1 (*)    AST 78 (*)    ALT 56 (*)    GFR calc non Af Amer 50 (*)    GFR calc Af Amer 58 (*)    All other components within normal limits  POCT I-STAT EG7 - Abnormal; Notable for the following components:   pCO2, Ven 69.8 (*)    Bicarbonate 33.8 (*)    TCO2 36 (*)    Acid-Base Excess 5.0 (*)    Calcium, Ion 1.12 (*)     All other components within normal limits  TROPONIN I (HIGH SENSITIVITY) - Abnormal; Notable for the following components:   Troponin I (High Sensitivity) 18 (*)    All other components within normal limits  BRAIN NATRIURETIC PEPTIDE  BLOOD GAS, VENOUS  TROPONIN I (HIGH SENSITIVITY)    EKG EKG Interpretation  Date/Time:  Wednesday December 14 2018 20:45:08 EDT Ventricular Rate:  83 PR Interval:    QRS Duration: 86 QT Interval:  384 QTC Calculation: 452 R Axis:   45 Text Interpretation:  Sinus rhythm Probable left atrial enlargement No significant change since last tracing Confirmed by Wandra Arthurs [id number]) on 12/14/2018 9:32:54 PM   Radiology Dg Chest Port 1 View  Result Date: 12/14/2018 CLINICAL DATA:  Initial evaluation for increased shortness of breath, history of CHF. EXAM: PORTABLE CHEST 1 VIEW COMPARISON:  Prior radiograph from 07/30/2017. FINDINGS: Transverse heart size stable, and remains within normal limits. Mediastinal silhouette normal. Mild aortic atherosclerosis. Lungs mildly hypoinflated. Perihilar vascular congestion with mild diffuse interstitial prominence, slightly greater within the left lung as compared to the right. Findings suggestive of mild pulmonary interstitial congestion/edema. Superimposed left perihilar atelectasis versus scarring. No consolidative opacity. No appreciable pleural effusion. No visible pneumothorax, although the patient's head partially obscures the lung apices. No acute osseous finding. IMPRESSION: 1. Cardiomegaly with perihilar vascular congestion and interstitial prominence, suggesting mild pulmonary interstitial congestion/edema. 2. Aortic atherosclerosis. Electronically Signed   By: Jeannine Boga M.D.   On: 12/14/2018 21:23  Procedures Procedures (including critical care time)  Medications Ordered in ED Medications  albuterol (VENTOLIN HFA) 108 (90 Base) MCG/ACT inhaler 2 puff (2 puffs Inhalation Given 12/14/18 2217)      Initial Impression / Assessment and Plan / ED Course  I have reviewed the triage vital signs and the nursing notes.  Pertinent labs & imaging results that were available during my care of the patient were reviewed by me and considered in my medical decision making (see chart for details).       Patient appears to be breathing comfortably on 2 L of oxygen, which is less than his home O2 level.  His pulse oximetry is about 100%.  VBG significant for normal pH with increased CO2 and increased bicarb, consistent with chronic CO2 retention.  CBC is within normal limits.  CXR with some vascular congestion but no signs of pneumonia.  BNP is within normal limits.  Will give albuterol 2 puffs once.  Patient declined COVID swab.  Will ambulate in room and if he can maintain oxygen saturation on 2 L of oxygen, he will be safe for discharge.  Patient ambulated on 2 L of oxygen without difficulty.  He was felt to be safe for discharge.  Final Clinical Impressions(s) / ED Diagnoses   Final diagnoses:  Shortness of breath    ED Discharge Orders    None       Lennox SoldersWinfrey, Amanda C, MD 12/14/18 2242    Charlynne PanderYao, David Hsienta, MD 12/15/18 1600

## 2018-12-15 ENCOUNTER — Telehealth: Payer: Self-pay | Admitting: Emergency Medicine

## 2018-12-15 NOTE — ED Notes (Addendum)
Pt discharged to waiting room with O2 tank. Called wife for ride. Wife states that she is unable to brine O2 tank to pick pt up but they live very close by and pt is able to travel home without o2 tank. Pt agrees. NAD NARD

## 2018-12-15 NOTE — Telephone Encounter (Signed)
Left message for patient to call back  

## 2018-12-16 NOTE — Telephone Encounter (Signed)
ATC patient, unable to reach, LM to call back 

## 2018-12-17 ENCOUNTER — Other Ambulatory Visit: Payer: Self-pay | Admitting: Cardiology

## 2018-12-19 NOTE — Telephone Encounter (Signed)
Called and spoke with pt's wife in regards to the O2. Per Georga Kaufmann, she believes that there is something wrong with the O2 machine and she said that someone from DME is going to come out and look at machine and replace it if needed. Pt is supposed to be on 2.5L but Hattie said that the machine keeps cutting off and will not stay on.  Stated to [facility] that pt was due for an OV and said to her that we could make this be a televisit. Hattie verbalized understanding and televisit has been scheduled with RB tomorrow, 8/4 at 2:45.nothing further needed.

## 2018-12-20 ENCOUNTER — Other Ambulatory Visit: Payer: Self-pay

## 2018-12-20 ENCOUNTER — Encounter: Payer: Self-pay | Admitting: Emergency Medicine

## 2018-12-20 ENCOUNTER — Ambulatory Visit (INDEPENDENT_AMBULATORY_CARE_PROVIDER_SITE_OTHER): Payer: Medicare Other | Admitting: Emergency Medicine

## 2018-12-20 DIAGNOSIS — J301 Allergic rhinitis due to pollen: Secondary | ICD-10-CM

## 2018-12-20 DIAGNOSIS — J449 Chronic obstructive pulmonary disease, unspecified: Secondary | ICD-10-CM | POA: Diagnosis not present

## 2018-12-20 DIAGNOSIS — J9611 Chronic respiratory failure with hypoxia: Secondary | ICD-10-CM | POA: Diagnosis not present

## 2018-12-20 NOTE — Assessment & Plan Note (Signed)
Continue O2 at 2.5L/min

## 2018-12-20 NOTE — Assessment & Plan Note (Signed)
We will call in flonase NS for him.

## 2018-12-20 NOTE — Assessment & Plan Note (Signed)
Doing well on Stiolto. Will continue same. He rarely uses albuterol. No exacerbations.  Plan to continue same regimen

## 2018-12-20 NOTE — Progress Notes (Signed)
Virtual Visit via Telephone Note  I connected with Ivan Parks on 12/20/18 at  2:45 PM EDT by telephone and verified that I am speaking with the correct person using two identifiers.  Location: Patient: home  Provider: Office   I discussed the limitations, risks, security and privacy concerns of performing an evaluation and management service by telephone and the availability of in person appointments. I also discussed with the patient that there may be a patient responsible charge related to this service. The patient expressed understanding and agreed to proceed.   History of Present Illness: 78 year old man with severe COPD and associated chronic hypoxemic respiratory failure.  He also has allergic rhinitis.  He uses oxygen at 2.5 L/min most of the time. At rest and w exertion. He just got a new concentrator  He was admitted to the hospital approximately 1 month ago with bilateral lower extremity ulcers and cellulitis, hypertension and acute on chronic diastolic CHF.  He is beginning to experience evidence for dementia.   Observations/Objective: His bronchodilator regimen includes Stiolto.  He has nasal congestion. He states that he has occasional exertional SOB. Has Proventil, uses rarely. He is doing well on O2 2.5 L/min. No cough, no sputum.   Assessment and Plan: COPD, severe ([name]) Doing well on Stiolto. Will continue same. He rarely uses albuterol. No exacerbations.  Plan to continue same regimen  Chronic respiratory failure Continue O2 at 2.5L/min  Allergic rhinitis We will call in flonase NS for him.    Follow Up Instructions: Follow up in 3 month.    I discussed the assessment and treatment plan with the patient. The patient was provided an opportunity to ask questions and all were answered. The patient agreed with the plan and demonstrated an understanding of the instructions.   The patient was advised to call back or seek an in-person evaluation if the symptoms worsen  or if the condition fails to improve as anticipated.  I provided 20 minutes of non-face-to-face time during this encounter.   Collene Gobble, MD

## 2019-01-10 ENCOUNTER — Other Ambulatory Visit: Payer: Self-pay | Admitting: Emergency Medicine

## 2019-01-12 ENCOUNTER — Other Ambulatory Visit: Payer: Self-pay | Admitting: Emergency Medicine

## 2019-03-01 DIAGNOSIS — G4733 Obstructive sleep apnea (adult) (pediatric): Secondary | ICD-10-CM | POA: Diagnosis not present

## 2019-03-01 DIAGNOSIS — G4737 Central sleep apnea in conditions classified elsewhere: Secondary | ICD-10-CM | POA: Diagnosis not present

## 2019-03-08 DIAGNOSIS — E1151 Type 2 diabetes mellitus with diabetic peripheral angiopathy without gangrene: Secondary | ICD-10-CM | POA: Diagnosis not present

## 2019-03-08 DIAGNOSIS — E785 Hyperlipidemia, unspecified: Secondary | ICD-10-CM | POA: Diagnosis not present

## 2019-03-08 DIAGNOSIS — R413 Other amnesia: Secondary | ICD-10-CM | POA: Diagnosis not present

## 2019-03-08 DIAGNOSIS — I739 Peripheral vascular disease, unspecified: Secondary | ICD-10-CM | POA: Diagnosis not present

## 2019-03-08 DIAGNOSIS — J449 Chronic obstructive pulmonary disease, unspecified: Secondary | ICD-10-CM | POA: Diagnosis not present

## 2019-03-08 DIAGNOSIS — I10 Essential (primary) hypertension: Secondary | ICD-10-CM | POA: Diagnosis not present

## 2019-03-14 DIAGNOSIS — E1165 Type 2 diabetes mellitus with hyperglycemia: Secondary | ICD-10-CM | POA: Diagnosis not present

## 2019-03-14 DIAGNOSIS — J441 Chronic obstructive pulmonary disease with (acute) exacerbation: Secondary | ICD-10-CM | POA: Diagnosis not present

## 2019-03-14 DIAGNOSIS — J449 Chronic obstructive pulmonary disease, unspecified: Secondary | ICD-10-CM | POA: Diagnosis not present

## 2019-03-14 DIAGNOSIS — E1151 Type 2 diabetes mellitus with diabetic peripheral angiopathy without gangrene: Secondary | ICD-10-CM | POA: Diagnosis not present

## 2019-03-14 DIAGNOSIS — I10 Essential (primary) hypertension: Secondary | ICD-10-CM | POA: Diagnosis not present

## 2019-03-14 DIAGNOSIS — E785 Hyperlipidemia, unspecified: Secondary | ICD-10-CM | POA: Diagnosis not present

## 2019-03-22 ENCOUNTER — Other Ambulatory Visit: Payer: Self-pay | Admitting: *Deleted

## 2019-03-22 NOTE — Patient Outreach (Signed)
  [place] [facility]) Care Management Chronic Special Needs Program    03/22/2019  Name: SHAAN RHOADS, DOB: 03-30-1941  MRN: [id number]   Mr. Millan Legan is enrolled in a chronic special needs plan for Diabetes.  Attempted to reach Mr. Ramroop via mobile number to complete initial assessment; no answer; left HIPAA compliant message requesting return call.  Plan: If client does not return call, will make second outreach call within one week.  Kelli Churn RN, CCM, [place] [facility] [phone]

## 2019-03-22 NOTE — Patient Outreach (Signed)
  [place] [facility]) Care Management Chronic Special Needs Program    03/22/2019  Name: Ivan Parks, DOB: March 23, 1941  MRN: [id number]   [name] is enrolled in a chronic special needs plan for Diabetes.  Mrs. Ivan Parks returned call to this [facility] and asked that the initial intake assessment be scheduled for Friday 03/24/19 at 11:00am.  Plan:  Per wife's request,  initial intake assessment be scheduled for Friday 03/24/19 at 11:00am.  Kelli Churn RN, CCM, [place] [facility] [phone]

## 2019-03-23 ENCOUNTER — Ambulatory Visit: Payer: Medicare Other | Admitting: *Deleted

## 2019-03-24 ENCOUNTER — Ambulatory Visit: Payer: Self-pay | Admitting: *Deleted

## 2019-03-24 ENCOUNTER — Other Ambulatory Visit: Payer: Self-pay | Admitting: *Deleted

## 2019-03-24 NOTE — Patient Outreach (Signed)
  [place] [facility]) Care Management Chronic Special Needs Program    03/24/2019  Name: AMYR SLUDER, DOB: 14-Apr-1941  MRN: [id number]   Mr. Regginald Pask is enrolled in a chronic special needs plan for Diabetes.  Initial intake call was scheduled for 11:00 am today with client's wife Quintrell Baze. She is the designated person to speak with regarding client's private health issues.  Attempted to reach Mrs. Boykin Reaper via mobile/home number at 11:00 am to complete initial assessment; no answer; left HIPAA compliant message requesting return call.  Plan: If client's wife does not return call, will make third outreach call within one week.  Kelli Churn RN, CCM, [place] [facility] [phone]

## 2019-03-27 ENCOUNTER — Other Ambulatory Visit: Payer: Self-pay | Admitting: *Deleted

## 2019-03-27 ENCOUNTER — Encounter: Payer: Self-pay | Admitting: *Deleted

## 2019-03-27 NOTE — Patient Outreach (Addendum)
[facility]) Care Management Chronic Special Needs Program  03/27/2019  Name: RYVER POBLETE DOB: [birth year]  MRN: [id number]   Mr. Temesgen Weightman is enrolled in a chronic special needs plan for Diabetes.  Mrs. Jared returned call to this RNCM at 9:40 am today and requested return call. Attempted to reach Mrs. Dalby via number she provided on her voice mail, [phone],  at 10: 20; no answer; left HIPAA compliant message requesting return call. Since this was the 3rd attempt at reaching the client and/or wife (she is designate party of release on record) the client's individualized care plan was developed based upon the completed health risk assessment and available data.  Goals    . Client understands the importance of follow-up with providers by attending scheduled visits     Review of electronic medial record's appointment tab indicates of 98 provider appointments, client has 12 No Shows or a 12% no show percentage rate    . Client will report abillity to obtain Medications within 3 months     04/12/19 Referral made to [facility] CM pharmacist for medication copay assistance    . Client will use Assistive Devices as needed and verbalize understanding of device use    . Client will verbalize knowledge of chronic lung disease as evidenced by no ED visits or Inpatient stays related to chronic lung disease      Mailed Emmi education related to COPD: When to Get Help and COPD, Including Emphysema mailed to client's home address    . Client will verbalize knowledge of self management of Hypertension as evidences by BP reading of 140/90 or less; or as defined by provider    . Client/Caregiver will verbalize understanding of instructions related to self-care and safety     Mailed "Check For Safety" brochure to client's home address    . Client/wife will report improved coping by 07/16/19     Referral made to social worker with request to provide spouse with Alzheimer's resources    . HEMOGLOBIN A1C < 8     Diabetes self management actions:  Glucose monitoring per provider recommendations  Eat Healthy  Check feet daily  Visit provider every 3-6 months as directed  Hbg A1C level every 3-6 months.  Eye Exam yearly    . Maintain timely refills of diabetic medication as prescribed within the year .     Review of prescription dispense reports indicates client maintains timely refills of diabetic medications as prescribed    . Obtain annual  Lipid Profile, LDL-C     Lipid panel was completed on 03/08/19 with all elements meeting target    . Obtain Annual Eye (retinal)  Exam      Review of electronic medical record and [facility] [employer] Now -point of care tool indicates client had most recent diabetic eye exam on 07/16/16    . Obtain Annual Foot Exam     Review of electronic medical record and [facility] [employer] Now -point of care tool indicates client had most recent diabetic foot exam on 08/02/18    . Obtain annual screen for micro albuminuria (urine) , nephropathy (kidney problems)     Review of electronic medical record and [facility] [employer] Now -point of care tool indicates client has not had urine for protein checked recently    . Obtain Hemoglobin A1C at least 2 times per year     Review of [facility] [employer] Now -point of care tool indicates client had Hgb A1C  checked on 11/16/18 with result of 8.1% and 08/02/18 with result of 10.15 and on 05/31/18 with result of 8.4%    . Visit Primary Care Provider or Endocrinologist at least 2 times per year      Review of electronic medical record and [facility] [employer] Now -point of care tool indicates client has completed four office visits in 2020 with his endocrinologist, Dr. Loanne Drilling       Plan:   . Send unsuccessful outreach letter with a copy of individualized care plan to client . Send individualized care plan to provider . Send Neurosurgeon on : HTN, memory loss,  COPD, and home safety . Make referral to [place] social worker for Alzheimer's resources for wife and client    Chronic care management coordinator will attempt outreach in 3 Months.   Kelli Churn RN, CCM, [place] [facility] [phone]

## 2019-03-27 NOTE — Patient Outreach (Signed)
  [place] [facility]) Care Management Chronic Special Needs Program    03/27/2019  Name: KINO DUNSWORTH, DOB: 01-Jul-1940  MRN: [id number]   Mr. Ricard Faulkner is enrolled in a chronic special needs plan for Diabetes.  Encounter opened in error, duplicate- please see other note of today.  Kelli Churn RN, CCM, [place] [facility] [phone]

## 2019-03-27 NOTE — Addendum Note (Signed)
Addended by: Barrington Ellison on: 03/27/2019 02:59 PM   Modules accepted: Orders

## 2019-03-28 ENCOUNTER — Other Ambulatory Visit: Payer: Self-pay

## 2019-03-28 NOTE — Patient Outreach (Signed)
[place] [facility]) Care Management  03/28/2019  ZACHAREE GADDIE 1940-08-20 [id number]   Social Work referral received from [facility], Kelli Churn.  "On the health risk assessment, it was indicated he is overwhelmed with diabetes, memory loss, following instructions and taking medications. Please make outreach to Mrs Bunn to assist her with caregiver resources for family members with dementia." Successful outreach to patient's spouse today. Per spouse, patient is currently independent with ADL's.  Daughter provides a great deal of support including assisting with wound care, transportation, attending MD appointments, and assisting with medication. Declined referral to pharmacy.   Per spouse, they are not seeking additional care at this time.  Spouse stated "we might need more help at some point but right now things are under control."  Family wishes to keep patient at home as long as possible and seem to be providing a great deal of support to patient.  Talked with spouse about various levels of care.  Spouse is already working with caseworker at [facility] for [employer] application process   Spouse is aware that certain services are not covered by Medicare so she is being proactive to get Medicaid coverage for patient if eligible.  No Social Work needs are identified at this time; closing discipline.  Encouraged spouse to call if needs arise.  Ronn Melena, BSW Social Worker [phone]

## 2019-03-30 ENCOUNTER — Ambulatory Visit: Payer: Self-pay | Admitting: *Deleted

## 2019-04-07 ENCOUNTER — Emergency Department (HOSPITAL_COMMUNITY)
Admission: EM | Admit: 2019-04-07 | Discharge: 2019-04-08 | Disposition: A | Discharge status: Home / Self Care | Payer: HMO | Attending: Emergency Medicine | Admitting: Emergency Medicine

## 2019-04-07 ENCOUNTER — Emergency Department (HOSPITAL_COMMUNITY): Payer: HMO

## 2019-04-07 ENCOUNTER — Encounter (HOSPITAL_COMMUNITY): Payer: Self-pay | Admitting: Emergency Medicine

## 2019-04-07 ENCOUNTER — Other Ambulatory Visit: Payer: Self-pay

## 2019-04-07 DIAGNOSIS — I509 Heart failure, unspecified: Secondary | ICD-10-CM | POA: Diagnosis not present

## 2019-04-07 DIAGNOSIS — E1165 Type 2 diabetes mellitus with hyperglycemia: Secondary | ICD-10-CM | POA: Diagnosis not present

## 2019-04-07 DIAGNOSIS — F0391 Unspecified dementia with behavioral disturbance: Secondary | ICD-10-CM | POA: Insufficient documentation

## 2019-04-07 DIAGNOSIS — I110 Hypertensive heart disease with heart failure: Secondary | ICD-10-CM | POA: Insufficient documentation

## 2019-04-07 DIAGNOSIS — E119 Type 2 diabetes mellitus without complications: Secondary | ICD-10-CM | POA: Insufficient documentation

## 2019-04-07 DIAGNOSIS — F419 Anxiety disorder, unspecified: Secondary | ICD-10-CM | POA: Diagnosis not present

## 2019-04-07 DIAGNOSIS — Z20828 Contact with and (suspected) exposure to other viral communicable diseases: Secondary | ICD-10-CM | POA: Diagnosis not present

## 2019-04-07 DIAGNOSIS — R410 Disorientation, unspecified: Secondary | ICD-10-CM | POA: Diagnosis not present

## 2019-04-07 DIAGNOSIS — Z87891 Personal history of nicotine dependence: Secondary | ICD-10-CM | POA: Insufficient documentation

## 2019-04-07 DIAGNOSIS — Z794 Long term (current) use of insulin: Secondary | ICD-10-CM | POA: Insufficient documentation

## 2019-04-07 DIAGNOSIS — J449 Chronic obstructive pulmonary disease, unspecified: Secondary | ICD-10-CM | POA: Diagnosis not present

## 2019-04-07 DIAGNOSIS — R451 Restlessness and agitation: Secondary | ICD-10-CM | POA: Diagnosis not present

## 2019-04-07 DIAGNOSIS — I10 Essential (primary) hypertension: Secondary | ICD-10-CM | POA: Diagnosis not present

## 2019-04-07 DIAGNOSIS — R4182 Altered mental status, unspecified: Secondary | ICD-10-CM | POA: Diagnosis not present

## 2019-04-07 DIAGNOSIS — Z743 Need for continuous supervision: Secondary | ICD-10-CM | POA: Diagnosis not present

## 2019-04-07 DIAGNOSIS — R4020 Unspecified coma: Secondary | ICD-10-CM | POA: Diagnosis not present

## 2019-04-07 DIAGNOSIS — R000 Tachycardia, unspecified: Secondary | ICD-10-CM | POA: Diagnosis not present

## 2019-04-07 HISTORY — DX: Unspecified dementia, unspecified severity, without behavioral disturbance, psychotic disturbance, mood disturbance, and anxiety: F03.90

## 2019-04-07 LAB — COMPREHENSIVE METABOLIC PANEL
ALT: 14 U/L (ref 0–44)
AST: 21 U/L (ref 15–41)
Albumin: 3.3 g/dL — ABNORMAL LOW (ref 3.5–5.0)
Alkaline Phosphatase: 71 U/L (ref 38–126)
Anion gap: 14 (ref 5–15)
BUN: 18 mg/dL (ref 8–23)
CO2: 27 mmol/L (ref 22–32)
Calcium: 8.9 mg/dL (ref 8.9–10.3)
Chloride: 96 mmol/L — ABNORMAL LOW (ref 98–111)
Creatinine, Ser: 1.54 mg/dL — ABNORMAL HIGH (ref 0.61–1.24)
GFR calc Af Amer: 49 mL/min — ABNORMAL LOW (ref >60)
GFR calc non Af Amer: 43 mL/min — ABNORMAL LOW (ref >60)
Glucose, Bld: 239 mg/dL — ABNORMAL HIGH (ref 70–99)
Potassium: 3.9 mmol/L (ref 3.5–5.1)
Sodium: 137 mmol/L (ref 135–145)
Total Bilirubin: 0.5 mg/dL (ref 0.3–1.2)
Total Protein: 7.7 g/dL (ref 6.5–8.1)

## 2019-04-07 LAB — CBC
HCT: 43.3 % (ref 39.0–52.0)
Hemoglobin: 13.2 g/dL (ref 13.0–17.0)
MCH: 27.8 pg (ref 26.0–34.0)
MCHC: 30.5 g/dL (ref 30.0–36.0)
MCV: 91.4 fL (ref 80.0–100.0)
Platelets: 187 10*3/uL (ref 150–400)
RBC: 4.74 MIL/uL (ref 4.22–5.81)
RDW: 12.2 % (ref 11.5–15.5)
WBC: 8.2 10*3/uL (ref 4.0–10.5)
nRBC: 0 % (ref 0.0–0.2)

## 2019-04-07 LAB — POCT I-STAT EG7
Acid-Base Excess: 6 mmol/L — ABNORMAL HIGH (ref 0.0–2.0)
Bicarbonate: 32.6 mmol/L — ABNORMAL HIGH (ref 20.0–28.0)
Calcium, Ion: 1.09 mmol/L — ABNORMAL LOW (ref 1.15–1.40)
HCT: 42 % (ref 39.0–52.0)
Hemoglobin: 14.3 g/dL (ref 13.0–17.0)
O2 Saturation: 61 %
Potassium: 3.7 mmol/L (ref 3.5–5.1)
Sodium: 138 mmol/L (ref 135–145)
TCO2: 34 mmol/L — ABNORMAL HIGH (ref 22–32)
pCO2, Ven: 51.4 mmHg (ref 44.0–60.0)
pH, Ven: 7.409 (ref 7.250–7.430)
pO2, Ven: 32 mmHg (ref 32.0–45.0)

## 2019-04-07 LAB — URINALYSIS, ROUTINE W REFLEX MICROSCOPIC
Bacteria, UA: NONE SEEN
Bilirubin Urine: NEGATIVE
Glucose, UA: 50 mg/dL — AB
Ketones, ur: NEGATIVE mg/dL
Leukocytes,Ua: NEGATIVE
Nitrite: NEGATIVE
Protein, ur: 100 mg/dL — AB
Specific Gravity, Urine: 1.012 (ref 1.005–1.030)
pH: 6 (ref 5.0–8.0)

## 2019-04-07 LAB — CBG MONITORING, ED
Glucose-Capillary: 223 mg/dL — ABNORMAL HIGH (ref 70–99)
Glucose-Capillary: 201 mg/dL — ABNORMAL HIGH (ref 70–99)

## 2019-04-07 MED ORDER — FUROSEMIDE 20 MG PO TABS
40.0000 mg | ORAL_TABLET | Freq: Every day | ORAL | Status: DC
  Administered 2019-04-08: 40 mg via ORAL
  Filled 2019-04-07: qty 2

## 2019-04-07 MED ORDER — ATORVASTATIN CALCIUM 10 MG PO TABS
20.0000 mg | ORAL_TABLET | Freq: Every day | ORAL | Status: DC
  Administered 2019-04-08: 20 mg via ORAL
  Filled 2019-04-07: qty 2

## 2019-04-07 MED ORDER — INSULIN ASPART 100 UNIT/ML ~~LOC~~ SOLN
0.0000 [IU] | Freq: Every day | SUBCUTANEOUS | Status: DC
  Administered 2019-04-07: 1 [IU] via SUBCUTANEOUS

## 2019-04-07 MED ORDER — INSULIN ASPART 100 UNIT/ML ~~LOC~~ SOLN
0.0000 [IU] | Freq: Three times a day (TID) | SUBCUTANEOUS | Status: DC
  Administered 2019-04-08: 1 [IU] via SUBCUTANEOUS

## 2019-04-07 MED ORDER — HALOPERIDOL LACTATE 5 MG/ML IJ SOLN
5.0000 mg | Freq: Once | INTRAMUSCULAR | Status: AC
  Administered 2019-04-07: 5 mg via INTRAMUSCULAR
  Filled 2019-04-07: qty 1

## 2019-04-07 MED ORDER — DONEPEZIL HCL 5 MG PO TABS
5.0000 mg | ORAL_TABLET | Freq: Every day | ORAL | Status: DC
  Filled 2019-04-07: qty 1

## 2019-04-07 MED ORDER — LISINOPRIL 20 MG PO TABS
20.0000 mg | ORAL_TABLET | Freq: Every day | ORAL | Status: DC
  Administered 2019-04-08: 20 mg via ORAL
  Filled 2019-04-07: qty 1

## 2019-04-07 MED ORDER — LORAZEPAM 1 MG PO TABS
1.0000 mg | ORAL_TABLET | Freq: Once | ORAL | Status: AC
  Administered 2019-04-07: 22:00:00 1 mg via ORAL
  Filled 2019-04-07: qty 1

## 2019-04-07 MED ORDER — SODIUM CHLORIDE 0.9% FLUSH: 3.0000 mL | Freq: Once | INTRAVENOUS | Status: DC

## 2019-04-07 NOTE — ED Notes (Signed)
This RN called staffing, stated they will send someone shortly, pt spitting at staff and yelling, confused.

## 2019-04-07 NOTE — ED Provider Notes (Signed)
[facility] EMERGENCY DEPARTMENT Provider Note   CSN: [id number] Arrival date & time: 04/07/19  1808     History   Chief Complaint Chief Complaint  Patient presents with   Dementia    HPI Ivan Parks is a 78 y.o. male.     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 Patient reports he feels like he may be sick but is not sure what is wrong.  Upon review of systems, patient states he may have a mild headache.  He denies any chest pain or difficulty breathing.  Patient denies any recent falls or trauma.  He denies any abdominal pain, nausea, vomiting.  Patient becomes agitated with further questioning.  After speaking with patient's wife, she reports patient typically is high functioning and cares for himself.  She states he makes himself eggs in the morning and takes care of his own medications.  However, she states after checking his med box she thinks he has not been taking his medications including his insulin and aricept in weeks.  No known recent illness.  No known falls or trauma.  She states today around 2 PM patient became confused and agitated.  She states he did not want to stand up and then he stood up and did not want to sit back down.  She reports he was not acting like himself.  This prompted them to call EMS.  Past Medical History:  Diagnosis Date   Acute respiratory failure (HCC)    AKI (acute kidney injury) (HCC) 04/01/2016   Bilateral lower extremity edema    Bronchitis    Chest pain 12/02/2017   CHF (congestive heart failure) (HCC)    Chronic respiratory failure (HCC) 11/26/2014   COPD (chronic obstructive pulmonary disease) (HCC)    COPD, severe (HCC) 11/19/2010     PULMONARY FUNCTON TEST 12/19/2010 FVC 1.71 FEV1 0.93 FEV1/FVC 54.4 FVC  % Predicted 45 FEV % Predicted 36 FeF 25-75 0.28 FeF 25-75 % Predicted 2.46     Dementia (HCC)    Diabetes (HCC) 05/02/2016   DM (diabetes mellitus) (HCC)    Essential hypertension 03/06/2015   hypertension    HTN (hypertension)    Hyperlipidemia    Hypotension 03/31/2016   Hypoxemia    Leg edema, right- Greater than Left leg 07/05/2014   Lower extremity edema    OSA (obstructive sleep apnea)    Peripheral vascular disease (HCC)    Tobacco abuse     Patient Active Problem List   Diagnosis Date Noted   Infection 11/15/2018   Diabetic foot ulcers (HCC) 11/15/2018   Chest pain 12/02/2017   Peripheral vascular disease (HCC)    Hyperlipidemia    CHF (congestive heart failure) (HCC)    Diabetes (HCC) 05/02/2016   AKI (acute kidney injury) (HCC) 04/01/2016   Hypotension 03/31/2016   Essential hypertension 03/06/2015   Chronic respiratory failure (HCC) 11/26/2014   Leg edema, right- Greater than Left leg 07/05/2014   Allergic rhinitis 11/09/2012   COPD, severe (HCC) 11/19/2010    Past Surgical History:  Procedure Laterality Date   COLON SURGERY          Home Medications    Prior to Admission medications   Medication Sig Start Date End Date Taking? Authorizing Provider  albuterol (PROVENTIL) (2.5 MG/3ML) 0.083% nebulizer solution TAKE 3 MLS (2.5 MG  TOTAL) BY NEBULIZATION EVERY 6 (SIX) HOURS AS NEEDED FOR WHEEZING OR SHORTNESS OF BREATH. 01/13/19  Yes Byrum, Rose Fillers, MD  albuterol (VENTOLIN HFA) 108 (90 Base) MCG/ACT inhaler Inhale 2 puffs into the lungs every 6 (six) hours as needed for wheezing or shortness of breath.   Yes [provider]  atorvastatin (LIPITOR) 20 MG tablet Take 1 tablet (20 mg total) by mouth daily at 6 PM. Patient taking differently: Take 20 mg by mouth daily.  11/22/18  Yes Buriev, Arie Sabina, MD  donepezil (ARICEPT) 5 MG tablet Take 5 mg by mouth at bedtime. 03/12/19  Yes [provider]  furosemide (LASIX) 40 MG tablet Take 1 tablet (40 mg total) by mouth daily. Please make appt for future  refills. 12/19/18  Yes Turner, Traci R, MD  insulin NPH Human (NOVOLIN N) 100 UNIT/ML injection Inject 26 Units into the skin daily after breakfast.   Yes [provider]  insulin regular (NOVOLIN R) 100 units/mL injection Inject 26 Units into the skin daily after breakfast.   Yes [provider]  lisinopril (ZESTRIL) 20 MG tablet Take 20 mg by mouth daily. 03/31/19  Yes [provider]  OXYGEN Inhale 2.5 L into the lungs continuous.   Yes [provider]  pantoprazole (PROTONIX) 40 MG tablet Take 1 tablet (40 mg total) by mouth daily. 12/17/17  Yes Turner, Eber Hong, MD  Tiotropium Bromide-Olodaterol (STIOLTO RESPIMAT) 2.5-2.5 MCG/ACT AERS USE 2 PUFFS ONCE A DAY Patient taking differently: Inhale 2 puffs into the lungs daily.  01/10/19  Yes Collene Gobble, MD  vitamin B-12 (CYANOCOBALAMIN) 1000 MCG tablet Take 1,000 mcg by mouth daily.   Yes [provider]  acetaminophen (TYLENOL) 325 MG tablet Take 2 tablets (650 mg total) by mouth every 6 (six) hours as needed for mild pain (or Fever >/= 101). Patient not taking: Reported on 04/07/2019 11/22/18   Kinnie Feil, MD  carvedilol (COREG) 12.5 MG tablet Take 1 tablet (12.5 mg total) by mouth 2 (two) times daily with a meal. Patient not taking: Reported on 04/07/2019 11/22/18   Kinnie Feil, MD  insulin aspart (NOVOLOG) 100 UNIT/ML injection Inject 0-15 Units into the skin 3 (three) times daily with meals. Insulin sliding scale Patient not taking: Reported on 04/07/2019 11/22/18   Kinnie Feil, MD  insulin glargine (LANTUS) 100 UNIT/ML injection Inject 0.1 mLs (10 Units total) into the skin daily. Patient not taking: Reported on 04/07/2019 11/23/18   Kinnie Feil, MD    Family History Family History  Problem Relation Age of Onset   Diabetes Mother    Hypertension Mother    Peripheral vascular disease Mother        amputation   Diabetes Father        Right Leg Amputation-Gangrene    Hypertension Father    Peripheral vascular disease Father        amputation   Cancer Father        Lead Poison-Ca   Pneumonia Father    Diabetes Sister    Hypertension Sister    Diabetes Daughter    Hypertension Daughter     Social History Social History   Tobacco Use   Smoking status: Former Smoker    Packs/day: 1.50    Years: 50.00    Pack years: 75.00    Types: Cigarettes    Quit date: 11/04/2010    Years since quitting: 8.4   Smokeless tobacco: Never Used  Substance Use Topics  Alcohol use: No    Alcohol/week: 0.0 standard drinks   Drug use: No     Allergies   Patient has no known allergies.   Review of Systems Review of Systems  Constitutional: Negative for chills and fever.  HENT: Negative for congestion.   Eyes: Negative for visual disturbance.  Respiratory: Negative for cough and shortness of breath.   Cardiovascular: Negative for chest pain and palpitations.  Gastrointestinal: Negative for abdominal pain and vomiting.  Genitourinary: Negative for dysuria and hematuria.  Musculoskeletal: Negative for arthralgias and back pain.  Skin: Negative for color change and rash.  Neurological: Negative for seizures and syncope.  Psychiatric/Behavioral: Positive for agitation, behavioral problems and confusion.  All other systems reviewed and are negative.    Physical Exam Updated Vital Signs BP (!) 143/85    Pulse (!) 104    Temp 98.7 F (37.1 C) (Oral)    Resp 15    SpO2 92% Comment: pt placed on 2.5 L (baseline o2 requirement)  Physical Exam Vitals signs and nursing note reviewed.  Constitutional:      Appearance: He is well-developed.     Comments: Acutely agitated but redirectable   HENT:     Head: Normocephalic and atraumatic.  Eyes:     Extraocular Movements: Extraocular movements intact.     Conjunctiva/sclera: Conjunctivae normal.     Pupils: Pupils are equal, round, and reactive to light.  Neck:     Musculoskeletal: Neck supple.    Cardiovascular:     Rate and Rhythm: Regular rhythm. Tachycardia present.     Heart sounds: No murmur.  Pulmonary:     Effort: Pulmonary effort is normal. No respiratory distress.     Breath sounds: Normal breath sounds.  Abdominal:     Palpations: Abdomen is soft.     Tenderness: There is no abdominal tenderness.  Musculoskeletal:     Right lower leg: Edema present.     Left lower leg: Edema present.     Comments: Evidence of chronic venous stasis of BLE  Skin:    General: Skin is warm and dry.  Neurological:     Mental Status: He is alert.     Comments: Oriented to person and place Agitated but redirectable 5/5 strength throughout  No facial asymmetry Endorses normal gross sensation   Psychiatric:     Comments: Agitated, yelling at staff      ED Treatments / Results  Labs (all labs ordered are listed, but only abnormal results are displayed) Labs Reviewed  COMPREHENSIVE METABOLIC PANEL - Abnormal; Notable for the following components:      Result Value   Chloride 96 (*)    Glucose, Bld 239 (*)    Creatinine, Ser 1.54 (*)    Albumin 3.3 (*)    GFR calc non Af Amer 43 (*)    GFR calc Af Amer 49 (*)    All other components within normal limits  URINALYSIS, ROUTINE W REFLEX MICROSCOPIC - Abnormal; Notable for the following components:   Glucose, UA 50 (*)    Hgb urine dipstick SMALL (*)    Protein, ur 100 (*)    All other components within normal limits  CBG MONITORING, ED - Abnormal; Notable for the following components:   Glucose-Capillary 223 (*)    All other components within normal limits  POCT I-STAT EG7 - Abnormal; Notable for the following components:   Bicarbonate 32.6 (*)    TCO2 34 (*)    Acid-Base Excess 6.0 (*)  Calcium, Ion 1.09 (*)    All other components within normal limits  CBG MONITORING, ED - Abnormal; Notable for the following components:   Glucose-Capillary 201 (*)    All other components within normal limits  SARS CORONAVIRUS 2 (TAT  6-24 HRS)  CBC    EKG EKG Interpretation  Date/Time:  Friday April 07 2019 20:38:58 EST Ventricular Rate:  103 PR Interval:    QRS Duration: 90 QT Interval:  348 QTC Calculation: 456 R Axis:   47 Text Interpretation: Sinus rhythm Confirmed by Virgina NorfolkAdam, [place] [phone]([id number]) on 04/07/2019 8:40:39 PM   Radiology Ct Head Wo Contrast  Result Date: 04/07/2019 CLINICAL DATA:  Altered level of consciousness EXAM: CT HEAD WITHOUT CONTRAST TECHNIQUE: Contiguous axial images were obtained from the base of the skull through the vertex without intravenous contrast. COMPARISON:  CT head November 04, 2010 FINDINGS: Brain: No evidence of acute infarction, hemorrhage, hydrocephalus, extra-axial collection or mass lesion/mass effect. Symmetric prominence of the ventricles, cisterns and sulci compatible with parenchymal volume loss. Somewhat confluent areas of white matter hypoattenuation are most compatible with chronic microvascular angiopathy. Vascular: Atherosclerotic calcification of the carotid siphons and intradural vertebral arteries. No hyperdense vessel. Skull: No calvarial fracture or suspicious osseous lesion. No scalp swelling or hematoma. Sinuses/Orbits: Minimal mural thickening in the right maxillary sinus. No air-fluid levels. Paranasal sinuses and mastoid air cells are otherwise predominantly clear. Included orbital structures are unremarkable. Other: Mild temporomandibular joint osteoarthrosis. IMPRESSION: 1. No acute intracranial abnormality. 2. Generalized parenchymal volume loss and chronic microvascular ischemic white matter disease. Electronically Signed   By: Kreg ShropshirePrice  DeHay M.D.   On: 04/07/2019 23:36   Dg Pelvis Portable  Result Date: 04/07/2019 CLINICAL DATA:  Altered mental status. Does not want to stay and. EXAM: PORTABLE PELVIS 1-2 VIEWS COMPARISON:  None. FINDINGS: The cortical margins of the bony pelvis are intact. No fracture. Pubic symphysis and sacroiliac joints are congruent. Both  femoral heads are well-seated in the respective acetabula. Bilateral hip osteoarthritis with acetabular spurring. Degenerative change of both sacroiliac joints. IMPRESSION: 1. No acute findings. 2. Bilateral hip osteoarthritis. Electronically Signed   By: Narda RutherfordMelanie  Sanford M.D.   On: 04/07/2019 22:26   Dg Chest Portable 1 View  Result Date: 04/07/2019 CLINICAL DATA:  Altered mental status EXAM: PORTABLE CHEST 1 VIEW COMPARISON:  12/14/2018 FINDINGS: The heart size and mediastinal contours are within normal limits. Mild aortic atherosclerosis. Both lungs are clear. The visualized skeletal structures are unremarkable. IMPRESSION: No active disease. Electronically Signed   By: Jasmine PangKim  Fujinaga M.D.   On: 04/07/2019 22:10    Procedures Procedures (including critical care time)  Medications Ordered in ED Medications  sodium chloride flush (NS) 0.9 % injection 3 mL (3 mLs Intravenous Not Given 04/07/19 2102)  atorvastatin (LIPITOR) tablet 20 mg (has no administration in time range)  donepezil (ARICEPT) tablet 5 mg (has no administration in time range)  furosemide (LASIX) tablet 40 mg (has no administration in time range)  lisinopril (ZESTRIL) tablet 20 mg (has no administration in time range)  insulin aspart (novoLOG) injection 0-5 Units (1 Units Subcutaneous Given 04/07/19 2308)  insulin aspart (novoLOG) injection 0-9 Units (has no administration in time range)  haloperidol lactate (HALDOL) injection 5 mg (5 mg Intramuscular Given 04/07/19 2135)  LORazepam (ATIVAN) tablet 1 mg (1 mg Oral Given 04/07/19 2213)     Initial Impression / Assessment and Plan / ED Course  I have reviewed the triage vital signs and the nursing notes.  Pertinent labs &  imaging results that were available during my care of the patient were reviewed by me and considered in my medical decision making (see chart for details).      EKG: Sinus tachycardia, no obvious evidence of ischemia or arrhythmia   On arrival,  patient is found to be agitated and yelling at staff.  Patient can be redirected somewhat and is able to follow commands.  He is alert knows his name and where he is.  Patient states he does not know why he was sent to the hospital he thinks his family was concerned about his sugar.  Patient denies any current pain.  No obvious gross neuro deficits.  Patient denies any symptoms. Patient is agitated cooperating with imaging and yelling at staff and was given IM Haldol.  Upon reassessment, patient continues to be agitated.  He was given 1 mg p.o. Ativan with subsequent improvement of agitation. Concern for behavioral change in setting of underlying dementia. Medical screen without obvious evidence of infection.  UA without signs of UTI.  Patient is mildly hyperglycemic without evidence of DKA/HHS.  Sliding scale insulin ordered. No evidence of hypercarbia as patient is chronically supposed on oxygen at home but was not wearing O2 on arrival.   CT head: No acute findings   Overall, concern for behavioral change in setting of missed medications including missed Aricept at home.  Patient may benefit from additional medication such as Seroquel.  Home medications ordered.  Consulted social work and case management placed.  Patient placed in psych hold.  Will consult psychiatry to further assess as patient may benefit from medication adjustments from a geriatric psychiatry standpoint.  No further acute events.  Family updated on plan of care. Oncoming team aware.  Final Clinical Impressions(s) / ED Diagnoses   Final diagnoses:  None    ED Discharge Orders    None       Norton Pastel, MD 04/08/19 0033    Virgina Norfolk, DO 04/09/19 0865

## 2019-04-07 NOTE — ED Notes (Signed)
[place] @ [phone] FOR A STATUS UPDATE--Alicianna Litchford

## 2019-04-07 NOTE — ED Provider Notes (Signed)
I have personally seen and examined the patient. I have reviewed the documentation on PMH/FH/Soc Hx. I have discussed the plan of care with the resident and patient.  I have reviewed and agree with the resident's documentation. Please see associated encounter note.  Briefly, the patient is a 78 y.o. male here with agitation, delirium.  Patient with unremarkable vitals.  No fever.  Appears to get more confused and agitated at home today.  He appears agitated and angry with me on my examination.  Overall he is neurologically intact.  Does not complain of any pain.  He is getting agitated with staff and started to become violent.  Had to give patient IM Haldol and Ativan for safety of patient and staff.  Patient has history of dementia but has not had too many behavioral issues in the past.  Has not had any fever per family.  He usually is able to take care of himself fairly well at home.  He lives at home with family.  Screening labs are ordered.  Will rule out infectious process.  Concern for possible dementia with behavioral disturbance.  No significant anemia, electrolyte abnormality, kidney injury.  EKG shows sinus rhythm.  No ischemic changes.  Chest x-ray with no signs of infection.  Urinalysis with no signs of infection.  We will get a CT scan of his head to rule out any acute intracranial process but then anticipate him being evaluated by psychiatry for dementia with behavioral disturbance.  He is not on any Seroquel or other sedating medications at home. Believe likely progression of dementia.  Will consult case management and social work in case patient is cleared by psychiatry to help arrange for further care at home.  Hemodynamically stable throughout my care.  .Critical Care Performed by: Lennice Sites, DO Authorized by: Lennice Sites, DO   Critical care provider statement:    Critical care time (minutes):  35   Critical care was necessary to treat or prevent imminent or life-threatening  deterioration of the following conditions:  CNS failure or compromise   Critical care was time spent personally by me on the following activities:  Blood draw for specimens, development of treatment plan with patient or surrogate, evaluation of patient's response to treatment, examination of patient, obtaining history from patient or surrogate, ordering and performing treatments and interventions, ordering and review of radiographic studies, pulse oximetry, re-evaluation of patient's condition, ordering and review of laboratory studies and review of old charts   I assumed direction of critical care for this patient from another provider in my specialty: no        EKG Interpretation  Date/Time:  Friday April 07 2019 20:38:58 EST Ventricular Rate:  103 PR Interval:    QRS Duration: 90 QT Interval:  348 QTC Calculation: 456 R Axis:   47 Text Interpretation: Sinus rhythm Confirmed by Lennice Sites [phone]) on 04/07/2019 8:40:39 PM         Lennice Sites, DO 04/07/19 2304

## 2019-04-07 NOTE — ED Triage Notes (Signed)
Pt arrives via EMS from home where they were called out for blood sugar check. CBG found to be 273. Pt is A&O but hx of dementia. Pt not taking his medications as prescribed. Family reports that he was agitated.

## 2019-04-07 NOTE — ED Notes (Signed)
Placed condom cath on patient for urine collection. 

## 2019-04-07 NOTE — ED Notes (Signed)
Patient transported to CT 

## 2019-04-08 LAB — SARS CORONAVIRUS 2 (TAT 6-24 HRS): SARS Coronavirus 2: NEGATIVE

## 2019-04-08 LAB — CBG MONITORING, ED
Glucose-Capillary: 139 mg/dL — ABNORMAL HIGH (ref 70–99)
Glucose-Capillary: 102 mg/dL — ABNORMAL HIGH (ref 70–99)

## 2019-04-08 NOTE — ED Notes (Signed)
Left message for spouse, Candi Leash - [phone] - for her to return call so may advise pt has been psych cleared so he will be ready for her to pick up at 1530.

## 2019-04-08 NOTE — ED Notes (Addendum)
Pt escorted to spouse's vehicle via w/c w/O2 infusing 3L/min. Pt's bag w/pt. D/C instructions discussed and given to pt to give to spouse. Spouse advised she had spoken w/[facility] re: tx plan. Pt unable to sign for d/c paperwork.

## 2019-04-08 NOTE — ED Notes (Signed)
Spouse advised Secretary on her way to pick up pt. Pt assisted getting dressed.

## 2019-04-08 NOTE — ED Notes (Signed)
Patient reports wallet was in pants pocket; Pt arrived with EMS yesterday evening. This RN and ED EMT searched pt's belongings; no wallet found.

## 2019-04-08 NOTE — Progress Notes (Signed)
CSW following for supports after TTS evaluation. CSW notes patient has a prior [place], [id number].

## 2019-04-08 NOTE — Care Management (Signed)
ED CM reviewed patient's record. Patient is currently waiting on a TTS assessment, CSW was made aware of patient as well.

## 2019-04-08 NOTE — BH Assessment (Signed)
Received TTS consult request. Spoke with Mel Almond, RN who said Pt is currently too somnolent to participate in assessment. [facility] will contact TTS when Pt is alert.   Evelena Peat, [facility], [facility] Triage Specialist [phone]

## 2019-04-08 NOTE — ED Notes (Signed)
Ordered breakfast--[name] 

## 2019-04-08 NOTE — ED Notes (Signed)
Pt ask was looking for his wallet, I looked  on his clothes pockets and the room but it wasn't there.

## 2019-04-08 NOTE — [facility] Counselor (Signed)
TTS attempted to re-assess patient. Percell Locus, RN, reported patient was getting ready to be moved to room #9.

## 2019-04-08 NOTE — [facility] Counselor (Signed)
Disposition:    Merlyn Lot, NP, patient is psych-cleared

## 2019-04-08 NOTE — ED Notes (Signed)
Pt's spouse, Haddie, called and advised she will call prior to coming to pick up pt at 1530.

## 2019-04-08 NOTE — Discharge Instructions (Signed)
Follow-up as instructed by behavioral health 

## 2019-04-08 NOTE — ED Notes (Signed)
Pt assisted w/changing into personal clothing from hospital gown while waiting for spouse.

## 2019-04-08 NOTE — ED Notes (Signed)
Pt awake now and is eating lunch.

## 2019-04-08 NOTE — ED Notes (Addendum)
Pt's spouse, Haddie, called and requested to be able to pick up pt around 1530 if plan is for pt to be d/c'd.

## 2019-04-08 NOTE — ED Notes (Signed)
Pt arrived to Rm 51 via recliner. Pt ambulated to bed. Pt noted w/O2 infusing at 3L/min. Pt alert, calm, cooperative.

## 2019-04-08 NOTE — ED Notes (Signed)
Checked patient blood sugar it was 102 notified RN Becky of blood sugar. I took patient saline lock out patient is resting with sitter at bedside

## 2019-04-08 NOTE — BH Assessment (Signed)
Tele Assessment Note   Patient Name: Ivan Parks MRN: [id number] Referring Physician: Virgina Norfolk, DO Location of Patient: [facility] Location of Provider: Behavioral Health TTS Department  Ivan Parks is an 78 y.o. male present to [facility] via ambulance after complaints of not being able to move, agitation and confusion. ED notes states, "78 year old male with PMH of COPD (2-3L O2 at baseline), CHF (preserved EF), DM, Dementia, HTN who presents to the ED with concern for agitation. Patient reports he feels like he may be sick but is not sure what is wrong.  Upon review of systems, patient states he may have a mild headache.  He denies any chest pain or difficulty breathing.  Patient denies any recent falls or trauma.  He denies any abdominal pain, nausea, vomiting.  Patient becomes agitated with further questioning."   Patient orientated to x4. Patient complained of being uncomfortable and ready to be discharged from the hospital. Patient denied SI, HI, auditory / visual hallucinations. Patient has history of Bi-polar  Collateral: Patient's wife spoke with TTS Clinical research associate. She reports patient was doing good until he complained of not being being to move, hold things in his hands. When EMS arrived and looked at patient diabetes medication it was determined patient had not taken 2 or 3 pills. EMS gave patient one pill and patient had another pill at the hospital per his wife. Patient's wife comfortable with patient being psych-cleared. Patient is followed by a doctor for his dementia.   Diagnosis: Dementia    Past Medical History:  Past Medical History:  Diagnosis Date  . Acute respiratory failure (HCC)   . AKI (acute kidney injury) (HCC) 04/01/2016  . Bilateral lower extremity edema   . Bronchitis   . Chest pain 12/02/2017  . CHF (congestive heart failure) (HCC)   . Chronic respiratory failure (HCC) 11/26/2014  . COPD (chronic obstructive pulmonary disease) (HCC)   . 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 Dementia (HCC)   . Diabetes (HCC) 05/02/2016  . DM (diabetes mellitus) (HCC)   . Essential hypertension 03/06/2015   hypertension   . HTN (hypertension)   . Hyperlipidemia   . Hypotension 03/31/2016  . Hypoxemia   . Leg edema, right- Greater than Left leg 07/05/2014   Lower extremity edema   . OSA (obstructive sleep apnea)   . Peripheral vascular disease (HCC)   . Tobacco abuse     Past Surgical History:  Procedure Laterality Date  . COLON SURGERY      Family History:  Family History  Problem Relation Age of Onset  . Diabetes Mother   . Hypertension Mother   . Peripheral vascular disease Mother        amputation  . Diabetes Father        Right Leg Amputation-Gangrene  . Hypertension Father   . Peripheral vascular disease Father        amputation  . Cancer Father        Lead Poison-Ca  . Pneumonia Father   . Diabetes Sister   . Hypertension Sister   . Diabetes Daughter   . Hypertension Daughter     Social History:  reports that he quit smoking about 8 years ago. His smoking use included cigarettes. He has a 75.00 pack-year smoking history. He has never  used smokeless tobacco. He reports that he does not drink alcohol or use drugs.  Additional Social History:  Alcohol / Drug Use Pain Medications: see MAR Prescriptions: see MAR Over the Counter: see MAR History of alcohol / drug use?: No history of alcohol / drug abuse  CIWA: CIWA-Ar BP: (!) 147/85 Pulse Rate: (!) 103 COWS:    Allergies: No Known Allergies  Home Medications: (Not in a hospital admission)   OB/GYN Status:  No LMP for male patient.  General Assessment Data Assessment unable to be completed: Yes Reason for not completing assessment: Pt is currently too somnolent to participate  Location of Assessment: [facility] TTS Assessment: In system Is  this a Tele or Face-to-Face Assessment?: Tele Assessment Is this an Initial Assessment or a Re-assessment for this encounter?: Initial Assessment Patient Accompanied by:: N/A Language Other than English: No Living Arrangements: Other (Comment)(live with wife/daughter/grandchildren) What gender do you identify as?: Male Marital status: Married [place] name: Ivan Parks Living Arrangements: Spouse/significant other Can pt return to current living arrangement?: Yes Admission Status: Voluntary Is patient capable of signing voluntary admission?: Yes Referral Source: Self/Family/Friend Insurance type: [employer]     Crisis Care Plan Living Arrangements: Spouse/significant other Name of Psychiatrist: [place] Name of Therapist: [facility]  Education Status Is patient currently in school?: No Is the patient employed, unemployed or receiving disability?: Receiving disability income  Risk to self with the past 6 months Suicidal Ideation: No Has patient been a risk to self within the past 6 months prior to admission? : No Suicidal Intent: No Has patient had any suicidal intent within the past 6 months prior to admission? : No Is patient at risk for suicide?: No Suicidal Plan?: No Has patient had any suicidal plan within the past 6 months prior to admission? : No Access to Means: No What has been your use of drugs/alcohol within the last 12 months?: n/a Previous Attempts/Gestures: No How many times?: 0 Other Self Harm Risks: none report  Triggers for Past Attempts: None known Intentional Self Injurious Behavior: None Family Suicide History: Unable to assess Recent stressful life event(s): Other (Comment)(medical condition (diabetes) ) Persecutory voices/beliefs?: No Depression: No Depression Symptoms: (none report ) Substance abuse history and/or treatment for substance abuse?: No Suicide prevention information given to non-admitted patients: Not applicable  Risk to Others within the past 6  months Homicidal Ideation: No Does patient have any lifetime risk of violence toward others beyond the six months prior to admission? : No Thoughts of Harm to Others: No Current Homicidal Intent: No Current Homicidal Plan: No Access to Homicidal Means: No Identified Victim: n/a History of harm to others?: No Assessment of Violence: None Noted Violent Behavior Description: None Noted  Does patient have access to weapons?: No Criminal Charges Pending?: No Does patient have a court date: No Is patient on probation?: No  Psychosis Hallucinations: None noted Delusions: None noted  Mental Status Report Appearance/Hygiene: In hospital gown Eye Contact: Fair Motor Activity: Agitation Speech: Logical/coherent Level of Consciousness: Alert Mood: Other (Comment)(agitation ) Affect: Other (Comment)(frustrated ) Anxiety Level: None Thought Processes: Coherent, Relevant Judgement: Unimpaired Orientation: Person, Place, Time, Situation Obsessive Compulsive Thoughts/Behaviors: None  Cognitive Functioning Concentration: Good Memory: (history of dementia ) Is patient IDD: No Insight: Fair Impulse Control: Fair Appetite: Fair Have you had any weight changes? : No Change Sleep: No Change Vegetative Symptoms: None  ADLScreening [facility] Assessment Services) Patient's cognitive ability adequate to safely complete daily activities?: Yes Patient able to express need for assistance  with ADLs?: Yes Independently performs ADLs?: Yes (appropriate for developmental age)  Prior Inpatient Therapy Prior Inpatient Therapy: (unknown )  Prior Outpatient Therapy Prior Outpatient Therapy: No Does patient have an ACCT team?: No Does patient have Intensive In-House Services?  : No Does patient have [facility] services? : No Does patient have P4CC services?: No  ADL Screening (condition at time of admission) Patient's cognitive ability adequate to safely complete daily activities?: Yes Is the patient  deaf or have difficulty hearing?: No Does the patient have difficulty seeing, even when wearing glasses/contacts?: No Does the patient have difficulty concentrating, remembering, or making decisions?: No Patient able to express need for assistance with ADLs?: Yes Does the patient have difficulty dressing or bathing?: No Independently performs ADLs?: Yes (appropriate for developmental age) Does the patient have difficulty walking or climbing stairs?: No       Abuse/Neglect Assessment (Assessment to be complete while patient is alone) Abuse/Neglect Assessment Can Be Completed: Yes Physical Abuse: Denies Verbal Abuse: Denies Sexual Abuse: Denies Exploitation of patient/patient's resources: Denies Self-Neglect: Denies     Merchant navy officerAdvance Directives (For Healthcare) Does Patient Have a Medical Advance Directive?: No Would patient like information on creating a medical advance directive?: No - Patient declined          Disposition:  Disposition Initial Assessment Completed for this Encounter: Roxanne MinsYes(Shnese Mills, NP, pt is psych-cleared )  This service was provided via telemedicine using a 2-way, interactive audio and video technology.  Names of all persons participating in this telemedicine service and their role in this encounter. Name: Littie DeedsHattie Haynie  Role: wife              Dian SituDelvondria Zorion Nims 04/08/2019 11:28 AM

## 2019-04-10 DIAGNOSIS — I10 Essential (primary) hypertension: Secondary | ICD-10-CM | POA: Diagnosis not present

## 2019-04-10 DIAGNOSIS — E785 Hyperlipidemia, unspecified: Secondary | ICD-10-CM | POA: Diagnosis not present

## 2019-04-10 DIAGNOSIS — E1151 Type 2 diabetes mellitus with diabetic peripheral angiopathy without gangrene: Secondary | ICD-10-CM | POA: Diagnosis not present

## 2019-04-10 DIAGNOSIS — J441 Chronic obstructive pulmonary disease with (acute) exacerbation: Secondary | ICD-10-CM | POA: Diagnosis not present

## 2019-04-10 DIAGNOSIS — E1165 Type 2 diabetes mellitus with hyperglycemia: Secondary | ICD-10-CM | POA: Diagnosis not present

## 2019-04-10 DIAGNOSIS — J449 Chronic obstructive pulmonary disease, unspecified: Secondary | ICD-10-CM | POA: Diagnosis not present

## 2019-04-12 ENCOUNTER — Other Ambulatory Visit: Payer: Self-pay | Admitting: *Deleted

## 2019-04-12 ENCOUNTER — Encounter: Payer: Self-pay | Admitting: *Deleted

## 2019-04-12 DIAGNOSIS — J449 Chronic obstructive pulmonary disease, unspecified: Secondary | ICD-10-CM

## 2019-04-12 DIAGNOSIS — E1122 Type 2 diabetes mellitus with diabetic chronic kidney disease: Secondary | ICD-10-CM

## 2019-04-12 NOTE — Patient Outreach (Addendum)
[facility]) Care Management Chronic Special Needs Program    04/12/2019  Name: FINNLEE SILVERNAIL, DOB: 1940/12/02  MRN: [id number]  Follow up on referral received 04/11/19 from [facility] Utilization Management Department regarding wife's request for medication copay assistance   Mr. Omarius Grantham is enrolled in a chronic special needs plan for Diabetes. Called client's contact number and spoke with his wife Dadrian Ballantine as she is the designated party of release for client as he was recently diagnosed with cognitive impairment.  Mrs. Joines states they have a limited income and pay $900 per month in rent, do not receive food stamps and  2 of their adult children live with them to help with driving errands, cooking and housekeeping. She says neither she nor Mr. Deshler drive. She is grateful for a referral to the [place] pharmacist for assistance with medication copays;  especially if they qualify for help with client's pulmonary and diabetes medications.  Mrs Ambrosia states she realized Mr Markarian has not been taking his medications as directed when he was taken to the emergency department on November 20. Mrs. Rouch states he was thoroughly assessed with multiple X-rays and scans and was told the diagnosis was anxiety. She says Mr. Mckibben self administers his  insulins but she is questioning if he is missing doses because his blood sugar was elevated at the hospital on admission , 239. She says she has been monitoring his medication management since he came home from the hospital. She says she would appreciate any they can receive with his medication copays and agrees that a medication box may be helpful. She says they get their medication via [facility].  Mrs. Clack says client's glucometer does not work and he needs a new one. Encouraged her to call Dr. Orest Dikes office and have a prescription for another [facility] glucometer faxed to their preferred pharmacy.   Goals    . Client understands the  importance of follow-up with providers by attending scheduled visits     Review of electronic medial record's appointment tab indicates of 98 provider appointments, client has 12 No Shows or a 12% no show percentage rate    . Client will report abillity to obtain Medications within 3 months     04/12/19 Referral made to [facility] pharmacist for medication copay assistance    . Client will use Assistive Devices as needed and verbalize understanding of device use    . Client will verbalize knowledge of chronic lung disease as evidenced by no ED visits or Inpatient stays related to chronic lung disease      Mailed Emmi education related to COPD: When to Get Help and COPD, Including Emphysema mailed to client's home address    . Client will verbalize knowledge of self management of Hypertension as evidences by BP reading of 140/90 or less; or as defined by provider    . Client/Caregiver will verbalize understanding of instructions related to self-care and safety     Mailed "Check For Safety" brochure to client's home address    . Client/wife will report improved coping by 07/16/19     Referral made to social worker on 03/27/19  with request to provide spouse with Alzheimer's resources    . HEMOGLOBIN A1C < 8     Diabetes self management actions:  Glucose monitoring per provider recommendations  Eat Healthy  Check feet daily  Visit provider every 3-6 months as directed  Hbg A1C level every 3-6 months.  Eye Exam yearly    .  Maintain timely refills of diabetic medication as prescribed within the year .     Review of prescription dispense reports indicates client maintains timely refills of diabetic medications as prescribed    . Obtain annual  Lipid Profile, LDL-C     Lipid panel was completed on 03/08/19 with all elements meeting target    . Obtain Annual Eye (retinal)  Exam      Review of electronic medical record and [facility]- Knowledge Performance Now -point of care tool indicates client  had most recent diabetic eye exam on 07/16/16    . Obtain Annual Foot Exam     Review of electronic medical record and [facility]- Knowledge Performance Now -point of care tool indicates client had most recent diabetic foot exam on 08/02/18    . Obtain annual screen for micro albuminuria (urine) , nephropathy (kidney problems)     Review of electronic medical record and [facility]- Knowledge Performance Now -point of care tool indicates client has not had urine for protein checked recently    . Obtain Hemoglobin A1C at least 2 times per year     Review of [facility]- Knowledge Performance Now -point of care tool indicates client had Hgb A1C checked on 11/16/18 with result of 8.1% and 08/02/18 with result of 10.15 and on 05/31/18 with result of 8.4%    . Visit Primary Care Provider or Endocrinologist at least 2 times per year      Review of electronic medical record and [facility]- Knowledge Performance Now -point of care tool indicates client has completed four office visits in 2020 with his endocrinologist, Dr. Everardo All       Plan:  Will make referral to [facility] pharmacist for medication copay assistance.  Will mail medication box to client's home address.   Cranford Mon RN, CCM, CDE Chronic Care Management Coordinator [facility] Care Management [phone]

## 2019-04-17 ENCOUNTER — Other Ambulatory Visit: Payer: Self-pay | Admitting: Pharmacist

## 2019-04-17 NOTE — Patient Outreach (Signed)
[facility]) Care Management  [place]   04/17/2019  KENNEDY BOHANON 1940/07/04 [id number]  [facility] pharmacy referral received for medication assistance for insulin and pulmonary medications.  Patient has PCP Dr. Harlan Stains with [facility].  This office has PharmD Vanessa Kick contracted with [facility] to see patient.  Message left with PharmD to transfer referral.    Plan: -Will close [facility] pharmacy case and update [facility] RN Jerene Canny, PharmD, [place] [phone]

## 2019-05-08 DIAGNOSIS — I10 Essential (primary) hypertension: Secondary | ICD-10-CM | POA: Diagnosis not present

## 2019-05-08 DIAGNOSIS — E785 Hyperlipidemia, unspecified: Secondary | ICD-10-CM | POA: Diagnosis not present

## 2019-05-08 DIAGNOSIS — R413 Other amnesia: Secondary | ICD-10-CM | POA: Diagnosis not present

## 2019-05-08 DIAGNOSIS — R634 Abnormal weight loss: Secondary | ICD-10-CM | POA: Diagnosis not present

## 2019-05-08 DIAGNOSIS — M25511 Pain in right shoulder: Secondary | ICD-10-CM | POA: Diagnosis not present

## 2019-05-08 DIAGNOSIS — E538 Deficiency of other specified B group vitamins: Secondary | ICD-10-CM | POA: Diagnosis not present

## 2019-05-08 DIAGNOSIS — D696 Thrombocytopenia, unspecified: Secondary | ICD-10-CM | POA: Diagnosis not present

## 2019-05-18 DIAGNOSIS — J449 Chronic obstructive pulmonary disease, unspecified: Secondary | ICD-10-CM | POA: Diagnosis not present

## 2019-05-18 DIAGNOSIS — E1151 Type 2 diabetes mellitus with diabetic peripheral angiopathy without gangrene: Secondary | ICD-10-CM | POA: Diagnosis not present

## 2019-05-18 DIAGNOSIS — E785 Hyperlipidemia, unspecified: Secondary | ICD-10-CM | POA: Diagnosis not present

## 2019-05-18 DIAGNOSIS — E1165 Type 2 diabetes mellitus with hyperglycemia: Secondary | ICD-10-CM | POA: Diagnosis not present

## 2019-05-18 DIAGNOSIS — I10 Essential (primary) hypertension: Secondary | ICD-10-CM | POA: Diagnosis not present

## 2019-05-18 DIAGNOSIS — J441 Chronic obstructive pulmonary disease with (acute) exacerbation: Secondary | ICD-10-CM | POA: Diagnosis not present

## 2019-05-25 DIAGNOSIS — E785 Hyperlipidemia, unspecified: Secondary | ICD-10-CM | POA: Diagnosis not present

## 2019-05-25 DIAGNOSIS — J449 Chronic obstructive pulmonary disease, unspecified: Secondary | ICD-10-CM | POA: Diagnosis not present

## 2019-05-25 DIAGNOSIS — I10 Essential (primary) hypertension: Secondary | ICD-10-CM | POA: Diagnosis not present

## 2019-05-25 DIAGNOSIS — E1165 Type 2 diabetes mellitus with hyperglycemia: Secondary | ICD-10-CM | POA: Diagnosis not present

## 2019-05-25 DIAGNOSIS — E1151 Type 2 diabetes mellitus with diabetic peripheral angiopathy without gangrene: Secondary | ICD-10-CM | POA: Diagnosis not present

## 2019-05-25 DIAGNOSIS — J441 Chronic obstructive pulmonary disease with (acute) exacerbation: Secondary | ICD-10-CM | POA: Diagnosis not present

## 2019-06-16 DIAGNOSIS — G4737 Central sleep apnea in conditions classified elsewhere: Secondary | ICD-10-CM | POA: Diagnosis not present

## 2019-06-16 DIAGNOSIS — G4733 Obstructive sleep apnea (adult) (pediatric): Secondary | ICD-10-CM | POA: Diagnosis not present

## 2019-06-20 ENCOUNTER — Encounter: Payer: Self-pay | Admitting: *Deleted

## 2019-06-20 ENCOUNTER — Other Ambulatory Visit: Payer: Self-pay | Admitting: *Deleted

## 2019-06-20 DIAGNOSIS — R4189 Other symptoms and signs involving cognitive functions and awareness: Secondary | ICD-10-CM

## 2019-06-20 NOTE — Patient Outreach (Addendum)
[facility]) Care Management Chronic Special Needs Program  06/20/2019  Name: MABLE LASHLEY DOB: 1941/04/04  MRN: [id number]  Mr. Serafino Burciaga is enrolled in a chronic special needs plan for Diabetes. Reviewed and updated care plan.  Subjective: Successful outreach to client's wife, Jaxon Flatt (she is the designated party of private health information release for client as he was recently diagnosed with cognitive impairment) and quarterly telephone assessment completed.  Mrs Kuang says Mr Nims is doing very well. She says they continue to limit trips outside the house due to the ongoing Covid 19 pandemic and the people in her household (spouse and two adult children) have remained Covid free.  In regards to Mr. Maalouf health conditions , Mrs. Deleeuw says he remains independent with his activities of daily living, taking his own medications including administering his own insulin, that he remains on 2 liters of oxygen and has not had any recent acute illness or problems. She says he does not use an assistive device when ambulating even though he has 2 walkers and a rollator. She also says he refuses to participate in the compression therapy using the home compression machine to treat his lower extremity edema. She also reports that their daughter [name] does the daily lower extremity care to Mr. Magner legs. Mrs. Podolsky states they have a home blood pressure machine and use it as needed. Mrs. Sadlon says she is working with a Child psychotherapist, Mrs. Nonie Hoyer, with the [facility] Department of Social Services to help her with a Medicaid application but she says she is confused and somewhat stressed in what the delay is (she says she needs to show evidence of more unpaid medical bills) and agrees to a referral to a [facility] [employer] to assist her if needed.    She says they are considering taking the Covid vaccine but again are fearful of going  outside their home to get the vaccine.   Goals Addressed            This Visit's Progress   . Client understands the importance of follow-up with providers by attending scheduled visits   On track    Per wife, client states their daughter [name] provides transportation and client consistently keeps provider appointments  Review of appointment tab in electronic medical record indicates client has 12% No Show rate with most recent no show on 10/19/17 for his appointment at the wound care clinic    . COMPLETED: Client will report ability to obtain Medications within 3 months   On track    Client's wife states [facility] pharmacist, [place], helps client when he needs assistance with his medication copays and that they are not currently struggling with medication copays     . COMPLETED: Client will use Assistive Devices as needed and verbalize understanding of device use       Client's wife states client has a cane, 2 rolling walker and a rollator but he does not use them as he can ambulate safely and steadily in the house and the walker have actually gotten in his way and put him at increased fall risk because of his oxygen tubing    . Client will verbalize knowledge of chronic lung disease as evidenced by no ED visits or Inpatient stays related to chronic lung disease    On track    03/27/19 Mailed Emmi education related to COPD: When to Get Help and COPD, Including Emphysema mailed to client's home  address 06/20/19 Reviewed current clinical status of lung disease by speaking with wife. And reviewing COPD action plan in the event of a COPD exacerbation    . Client will verbalize knowledge of self management of Hypertension as evidences by BP reading of 140/90 or less; or as defined by provider   Worsening    Reviewed HTN management strategies with wife- she states client takes his blood pressure medication as prescribed, limits sodium intake, they have a blood pressure monitor but he does not  check his blood pressure. BP readings at provider's office demonstrate 2 of 3 BP readings did not meet target of <140/<90    . Client/Caregiver will verbalize understanding of instructions related to self-care and safety   On track    03/27/19 Mailed "Check For Safety" brochure to client's home address 06/20/19 Wife states client ts able to do all self care related to ADL's and their children help with IADL's    . COMPLETED: Client/wife will report improved coping by 07/16/19       03/27/19 Referral made to social worker with request to provide spouse with Alzheimer's resources and social worker reached out to Mrs. Anselm Lis on 03/28/19; Mrs. Chock declined assistance at that time  2/2/1 Mrs Wurzer states client is doing very well and therefore her stress is much less. She voices appreciation for offered assistance and quarterly calls from this RNCM    . HEMOGLOBIN A1C < 8       Reviewed diabetes self management actions: with client's wife including:  Glucose monitoring per provider recommendations -she states client is currently not checking his blood sugars and she no longer asks him to check because he becomes upset  Eat Healthy  Check feet daily- Mrs Elison states she their daughter [name] checks his feet daily when she does his leg care  Visit provider every 3-6 months as directed  Hbg A1C level every 3-6 months  Eye Exam yearly- per client's wife, not done in 2020 as client was fearful of going out any more than necessary due to Covid 19 pandemic    . Maintain timely refills of diabetic medication as prescribed within the year .   On track    Wife states client receives his medications from [facility] pharmacy as prescribed    . COMPLETED: Obtain annual  Lipid Profile, LDL-C       Lipid panel was completed on 03/08/19 with all elements meeting target    . Obtain Annual Eye (retinal)  Exam    Not on track    Wife states he is overdue for his eye exam but they are limiting appointments until  the Covid 19 pandemic is over    . COMPLETED: Obtain Annual Foot Exam   On track    Review of electronic medical record and [facility]- Knowledge Performance Now -point of care tool indicates client had most recent diabetic foot exam on 08/02/18    . Obtain annual screen for micro albuminuria (urine) , nephropathy (kidney problems)   Not on track    Review of electronic medical record and [facility]- Knowledge Performance Now -point of care tool indicates client has not had urine for protein checked recently    . COMPLETED: Obtain Hemoglobin A1C at least 2 times per year       Review of [facility]- Knowledge Performance Now -point of care tool indicates client had Hgb A1C checked on 11/16/18 with result of 8.1% and 08/02/18 with result of 10.15% and on 05/31/18 with result of  8.4%    . COMPLETED: Visit Primary Care Provider or Endocrinologist at least 2 times per year    On track    Review of electronic medical record and [facility]- Knowledge Performance Now -point of care tool indicates client has completed four office visits in 2020 with his endocrinologist, Dr. Loanne Drilling and completed two appointments with his primary care provider in 2020      Assessment: Client is not meeting diabetes self-management goal of hemoglobin A1C of <8% with most recent reading of  8.1 % on 11/16/18. Client's wife has good understanding of:  COVID-19 cause, symptoms, precautions (social distancing, stay at home order, hand washing, wear face covering when unable to maintain or ensure 6 foot social distancing), and symptoms requiring provider notification.  Plan:   Send successful outreach letter with a copy of their individualized care plan to client Send individual care plan to provider Chronic care management coordinator will outreach in:  3 Months Will refer to:  Social Work for assistance with DSS Medicaid application if needed.   Kelli Churn RN, CCM, [place] Management Coordinator [facility] Care Management  [phone]

## 2019-06-21 NOTE — Addendum Note (Signed)
Addended by: Bary Richard on: 06/21/2019 09:14 AM   Modules accepted: Orders

## 2019-06-22 ENCOUNTER — Other Ambulatory Visit: Payer: Self-pay

## 2019-06-22 NOTE — Patient Outreach (Signed)
[facility]) Care Management  06/22/2019  FERNADO BRIGANTE 09-Dec-1940 [id number]   Social Work referral received from [facility], [place] Mon, on 06/21/19.  Referral stated "WIfe is primary caregiver and signed DPR in chart (done 02/02/18) She is mildly stressed and a little confused regarding applying for Medicaid. The DSS SW Mrs Yanik is working with is Mrs Nonie Hoyer. Mrs Rainford says she was asked to provide more evidence of unpaid medical bills from [facility] which she has done but she is questioning the delay. This may just be a lack of knowledge on Mrs. Brousseau's part of the Medicaid application process and that it doesn't happen quickily. Mrs. Ullom said any help we could provide would be appreciated as she know Mr Perleberg will likely require more hands on care as his dementia progresses. She gave permission for the [facility] CM SW to speak with Mrs. Nonie Hoyer if that would help." Successful outreach to patient's spouse today.  Spouse reports that she/patient did not initiate Medicaid application.  Per spouse, she was contacted by Ms. LeGrande at [facility] DSS and was told that "social security" initiated application for possible 6 months of Medicaid coverage.  Per spouse, DSS requested copy of medical bills with [facility].  Spouse submitted requested documentation but was told that the total amount of bills did not meet requirement to be approved for coverage.  Per spouse, she has requested more documentation from [facility].  Once received, she will forward this to Ms. LeGrande.  Patient expressed frustration with the situation but denied need for further assistance as she is waiting for documentation from [facility] to proceed with application process.  Social Work case being closed but did encourage her to call if additional needs arise.  Malachy Chamber, BSW Social Worker [phone]

## 2019-06-26 DIAGNOSIS — E1165 Type 2 diabetes mellitus with hyperglycemia: Secondary | ICD-10-CM | POA: Diagnosis not present

## 2019-06-26 DIAGNOSIS — E1151 Type 2 diabetes mellitus with diabetic peripheral angiopathy without gangrene: Secondary | ICD-10-CM | POA: Diagnosis not present

## 2019-06-26 DIAGNOSIS — J449 Chronic obstructive pulmonary disease, unspecified: Secondary | ICD-10-CM | POA: Diagnosis not present

## 2019-06-26 DIAGNOSIS — E785 Hyperlipidemia, unspecified: Secondary | ICD-10-CM | POA: Diagnosis not present

## 2019-06-26 DIAGNOSIS — I10 Essential (primary) hypertension: Secondary | ICD-10-CM | POA: Diagnosis not present

## 2019-06-26 DIAGNOSIS — J441 Chronic obstructive pulmonary disease with (acute) exacerbation: Secondary | ICD-10-CM | POA: Diagnosis not present

## 2019-06-28 ENCOUNTER — Other Ambulatory Visit: Payer: Self-pay | Admitting: *Deleted

## 2019-06-28 NOTE — Patient Outreach (Signed)
  [facility]) Care Management Chronic Special Needs Program   06/28/2019  Name: Ivan Parks, DOB: 1940-09-15  MRN: [id number]  The client was discussed in today's interdisciplinary care team meeting.  The following issues were discussed:  Client's needs, Key risk triggers/risk stratification, Care Plan, Coordination of care and Issues/barriers to care  Participants present:   Kathyrn Sheriff, MSN, RN, CCM  Melissa Sandlin RN,BSN,CCM, CDE  [place] Mon, RN, CCM, CDE George Ina RN, BSN, CCM  Charlott Rakes, MD  Livia Snellen, RN, BSN, MS, CCM Jacob Moores, MD [facility] Team: Dessa Phi, RN; Lenise Herald, RN, BSN  Recommendations: Client may benefit from Remote [facility] home visit for in home assessment and to assist with closing quality gaps of past due Hgb A1C, past due diabetic eye exam, diabetic foot exam due in March.   Plan: Per [facility] [employer] officer, nature Brooke Bonito [place]'s  request, secure e-mail sent to her so that she can investigate arranging [facility] home visit.   Follow-up: RNCM will follow up with client per Health [employer] Advant[employer] Model Of Care tier level.  [place] Mon RN, CCM, CDCES Chronic Care Management Coordinator [facility] Care Management [phone]

## 2019-07-16 DIAGNOSIS — G4737 Central sleep apnea in conditions classified elsewhere: Secondary | ICD-10-CM | POA: Diagnosis not present

## 2019-07-16 DIAGNOSIS — G4733 Obstructive sleep apnea (adult) (pediatric): Secondary | ICD-10-CM | POA: Diagnosis not present

## 2019-07-26 DIAGNOSIS — I10 Essential (primary) hypertension: Secondary | ICD-10-CM | POA: Diagnosis not present

## 2019-07-26 DIAGNOSIS — E1165 Type 2 diabetes mellitus with hyperglycemia: Secondary | ICD-10-CM | POA: Diagnosis not present

## 2019-07-26 DIAGNOSIS — J449 Chronic obstructive pulmonary disease, unspecified: Secondary | ICD-10-CM | POA: Diagnosis not present

## 2019-07-26 DIAGNOSIS — E785 Hyperlipidemia, unspecified: Secondary | ICD-10-CM | POA: Diagnosis not present

## 2019-07-26 DIAGNOSIS — E1151 Type 2 diabetes mellitus with diabetic peripheral angiopathy without gangrene: Secondary | ICD-10-CM | POA: Diagnosis not present

## 2019-07-26 DIAGNOSIS — J441 Chronic obstructive pulmonary disease with (acute) exacerbation: Secondary | ICD-10-CM | POA: Diagnosis not present

## 2019-08-09 DIAGNOSIS — J9611 Chronic respiratory failure with hypoxia: Secondary | ICD-10-CM | POA: Diagnosis not present

## 2019-08-09 DIAGNOSIS — J449 Chronic obstructive pulmonary disease, unspecified: Secondary | ICD-10-CM | POA: Diagnosis not present

## 2019-08-09 DIAGNOSIS — R609 Edema, unspecified: Secondary | ICD-10-CM | POA: Diagnosis not present

## 2019-08-09 DIAGNOSIS — R413 Other amnesia: Secondary | ICD-10-CM | POA: Diagnosis not present

## 2019-08-09 DIAGNOSIS — I10 Essential (primary) hypertension: Secondary | ICD-10-CM | POA: Diagnosis not present

## 2019-08-09 DIAGNOSIS — R350 Frequency of micturition: Secondary | ICD-10-CM | POA: Diagnosis not present

## 2019-08-09 DIAGNOSIS — E1151 Type 2 diabetes mellitus with diabetic peripheral angiopathy without gangrene: Secondary | ICD-10-CM | POA: Diagnosis not present

## 2019-08-13 DIAGNOSIS — G4733 Obstructive sleep apnea (adult) (pediatric): Secondary | ICD-10-CM | POA: Diagnosis not present

## 2019-08-13 DIAGNOSIS — G4737 Central sleep apnea in conditions classified elsewhere: Secondary | ICD-10-CM | POA: Diagnosis not present

## 2019-09-04 ENCOUNTER — Other Ambulatory Visit: Payer: Self-pay

## 2019-09-04 DIAGNOSIS — E785 Hyperlipidemia, unspecified: Secondary | ICD-10-CM | POA: Diagnosis not present

## 2019-09-04 DIAGNOSIS — E1165 Type 2 diabetes mellitus with hyperglycemia: Secondary | ICD-10-CM | POA: Diagnosis not present

## 2019-09-04 DIAGNOSIS — J441 Chronic obstructive pulmonary disease with (acute) exacerbation: Secondary | ICD-10-CM | POA: Diagnosis not present

## 2019-09-04 DIAGNOSIS — E1151 Type 2 diabetes mellitus with diabetic peripheral angiopathy without gangrene: Secondary | ICD-10-CM | POA: Diagnosis not present

## 2019-09-04 DIAGNOSIS — J449 Chronic obstructive pulmonary disease, unspecified: Secondary | ICD-10-CM | POA: Diagnosis not present

## 2019-09-04 DIAGNOSIS — I10 Essential (primary) hypertension: Secondary | ICD-10-CM | POA: Diagnosis not present

## 2019-09-04 NOTE — Patient Outreach (Signed)
  [facility]) Care Management Chronic Special Needs Program    09/04/2019  Name: DESJUAN STEARNS, DOB: 03-Aug-1940  MRN: [id number]   Mr. Kalvin Buss is enrolled in a chronic special needs plan for Diabetes. Telephone call to client's wife, Dorsey Authement. Unable to reach. HIPAA compliant voice message left with call back phone number.  Telephone call to client's daughter, [name]. Unable to reach. HIPAA compliant voice message left with call back phone number.   PLAN; RNCM will attempt 2nd telephone call to wife and/ or daughter within 1 week.   George Ina RN,BSN,CCM Chronic Care Management Coordinator [facility] Care Management [phone]

## 2019-09-11 ENCOUNTER — Other Ambulatory Visit: Payer: Self-pay

## 2019-09-12 ENCOUNTER — Other Ambulatory Visit: Payer: Self-pay

## 2019-09-12 NOTE — Patient Outreach (Signed)
  [facility]) Care Management Chronic Special Needs Program    09/12/2019  Name: Ivan Parks, DOB: [birth year]  MRN: [id number]   [name] is enrolled in a chronic special needs plan for Diabetes. Telephone call to client for assessment follow up. Unable to reach. HIPAA compliant voice message left with call back phone number.   PLAN: RNCM will attempt 2nd telephone call to client within 1 week.   George Ina RN,BSN,CCM Chronic Care Management Coordinator [facility] Care Management [phone]

## 2019-09-12 NOTE — Patient Outreach (Addendum)
[facility]) Care Management Chronic Special Needs Program  09/12/2019  Name: Ivan Parks DOB: Nov 29, 1940  MRN: [id number]  [name] is enrolled in a chronic special needs plan for DiabetesTelephone call to client's wife, [name] to complete assessment follow up. HIPAA verified by wife for client. Wife states client is doing is doing good. She states, " Nothing has changed with him."  Wife states client recently had a follow up with his primary care provider. She reports their daughter, Ivan Parks takes client to his appointments. Wife reports client is taking his medications as prescribed. Wife requested clients medications be reviewed with daughter, Ivan Parks.  Wife states she does not know what client's blood sugars have been. She states client says he checks his blood sugar. Wife states, " I don't want to argue with him about it so I let him handle it."  Wife reports client had 1 fall within the past 3 months. She states he was asleep in the chair and was leaning over to far and fell out. She states client sustained a scrap to his leg and his daughter cared for it . She reports the scrapped area has healed. Wife states client does not go to the wound care center anymore. She reports client's daughter Ivan Parks checks/ monitors clients legs. Wife states client continues to use his walker and cane as needed for ambulation. Wife states client continues to wear his oxygen at 2L as recommended by his doctor. She confirms clients last follow up visit with his pulmonologist was 12/20/18 . RNCM attempted call to daughter, Ivan Parks to review medications. Unable to reach. HIPAA compliant voice message left with call back phone number and return call request.  RNCM contacted client's ophthalmology office and spoke with [name]. Confirmed clients last eye exam was 11/24/16. Requested office follow up with patient to schedule annual eye exam.  [name] stated she would call patient and daughter  to schedule.      Goals Addressed            This Visit's Progress   . Client understands the importance of follow-up with providers by attending scheduled visits   On track    Per wife, client states their daughter Ivan Parks provides transportation and client consistently keeps provider appointments  Most recent primary care provider visit 08/09/19 Endocrinologist visit.10/28/18      . Client will verbalize knowledge of chronic lung disease as evidenced by no ED visits or Inpatient stays related to chronic lung disease    On track    RN case manager discussed COPD action plan with wife Refer to your Health team advantage calendar for COPD action plan     . Client will verbalize knowledge of self management of Hypertension as evidences by BP reading of 140/90 or less; or as defined by provider   On track    Continue to take your medications as prescribed.  Call your doctor if you have questions or concerns.  Follow up with your provider as recommended.     . Client's wife and/ or daughter will report client completed eye exam within 3 months. (pt-stated)       RN case manager discussed importance of annual eye exam for client with wife.  RN case manager contacted Dr. [name] office and request office contact patient/ wife to schedule appointment for annual eye exam.     . Client/Caregiver will verbalize understanding of instructions related to self-care and safety   On track  RN case manager will send client education article:  Preventing falls Please make sure walk ways in home are well lite. Please use cane/ walker as advised by doctor for walking Please make sure walkways in the home are clear     . HEMOGLOBIN A1C < 8       Rediscussed diabetes self management with wife for client:  Glucose monitoring per provider recommendation  Visit provider every 3-6 months as directed  Hbg A1C level every 3-6 months.  Carbohydrate controlled meal planning  Taking diabetes  medication as prescribed by provider  Physical activity     . Maintain timely refills of diabetic medication as prescribed within the year .   On track    Wife states client has his medications refilled timely. Wife states client's daughter, Ivan Parks assists with making sure clients medications are refilled.  Wife reports client takes his medications as prescribed.    . Obtain annual  Lipid Profile, LDL-C   On track    Lipid panel was completed on 03/08/19  RN case manager will send client education article: Heart healthy diet.  The goal for LDL is less than 70 mg/ dl as you are at high risk for complications try to avoid saturated fats, trans-fats and eat more fiber.  Goal renewed 2021     . Obtain Annual Eye (retinal)  Exam    Not on track    Wife states he is overdue for his eye exam.  Last visit per Dr. [name] office 11/24/16 RN case manager contacted client's ophthalmology office(Dr. [name]) and requested assistance with scheduling client annual eye exam.      . Obtain Annual Foot Exam   On track    most recent diabetic foot exam on 08/02/18 Diabetes foot care - Check feet daily at home (look for skin color changes, cuts, sores or cracks in the skin, swelling of feet or ankles, ingrown or fungal toenails, corn or calluses). Report these findings to your doctor - Wash feet with soap and water, dry feet well especially between toes - Moisturize your feet but not between the toes - Always wear shoes that protect your whole feet.  Goal renewed 2021     . Obtain annual screen for micro albuminuria (urine) , nephropathy (kidney problems)   Not on track    Per chart review client has not had urine for protein checked recently Attend yearly physical and follow up visits with your provider and complete labs as recommended.      Illa Level Hemoglobin A1C at least 2 times per year   On track    Hgb A1c completed  11/16/18 with result of 8.1% and 08/02/18 with result of 10.15%  Discussed  with wife importance of client having consistent follow up with provider for exams and lab work.  Goal renewed 2021    . Visit Primary Care Provider or Endocrinologist at least 2 times per year    On track    Most recent primary care provider visit 08/09/19 Most recent endocrinology visit 10/28/18 Continue to keep your follow up appointments with your providers and have lab work completed as recommended Goal renewed 2021       Assessment:   Individualized care plan reviewed and updated.  Unsure if client is consistently checking his blood sugars.  Client is not meeting diabetes self-management goal of hemoglobin A1C of <7.0% with most recent reading of 8.1% on /1//21 .  Unable to complete medication reconciliation with wife. Unsuccessful call  to daughter to complete. Message was left with daughter for return call      Plan:  Send successful outreach letter with a copy of their individualized care plan, Send individual care plan to provider and Send educational material  Chronic care management coordinator will outreach in:  3 Months   George Ina RN,BSN,CCM Chronic Care Management Coordinator [facility] Care Management [phone]        .

## 2019-09-13 DIAGNOSIS — G4737 Central sleep apnea in conditions classified elsewhere: Secondary | ICD-10-CM | POA: Diagnosis not present

## 2019-09-13 DIAGNOSIS — G4733 Obstructive sleep apnea (adult) (pediatric): Secondary | ICD-10-CM | POA: Diagnosis not present

## 2019-09-21 ENCOUNTER — Ambulatory Visit: Payer: HMO

## 2019-10-03 DIAGNOSIS — E785 Hyperlipidemia, unspecified: Secondary | ICD-10-CM | POA: Diagnosis not present

## 2019-10-03 DIAGNOSIS — E1151 Type 2 diabetes mellitus with diabetic peripheral angiopathy without gangrene: Secondary | ICD-10-CM | POA: Diagnosis not present

## 2019-10-03 DIAGNOSIS — J441 Chronic obstructive pulmonary disease with (acute) exacerbation: Secondary | ICD-10-CM | POA: Diagnosis not present

## 2019-10-03 DIAGNOSIS — J449 Chronic obstructive pulmonary disease, unspecified: Secondary | ICD-10-CM | POA: Diagnosis not present

## 2019-10-03 DIAGNOSIS — E1165 Type 2 diabetes mellitus with hyperglycemia: Secondary | ICD-10-CM | POA: Diagnosis not present

## 2019-10-03 DIAGNOSIS — I10 Essential (primary) hypertension: Secondary | ICD-10-CM | POA: Diagnosis not present

## 2019-10-13 DIAGNOSIS — G4733 Obstructive sleep apnea (adult) (pediatric): Secondary | ICD-10-CM | POA: Diagnosis not present

## 2019-10-13 DIAGNOSIS — G4737 Central sleep apnea in conditions classified elsewhere: Secondary | ICD-10-CM | POA: Diagnosis not present

## 2019-10-27 DIAGNOSIS — I10 Essential (primary) hypertension: Secondary | ICD-10-CM | POA: Diagnosis not present

## 2019-10-27 DIAGNOSIS — J449 Chronic obstructive pulmonary disease, unspecified: Secondary | ICD-10-CM | POA: Diagnosis not present

## 2019-10-27 DIAGNOSIS — E1151 Type 2 diabetes mellitus with diabetic peripheral angiopathy without gangrene: Secondary | ICD-10-CM | POA: Diagnosis not present

## 2019-10-27 DIAGNOSIS — E785 Hyperlipidemia, unspecified: Secondary | ICD-10-CM | POA: Diagnosis not present

## 2019-10-27 DIAGNOSIS — J441 Chronic obstructive pulmonary disease with (acute) exacerbation: Secondary | ICD-10-CM | POA: Diagnosis not present

## 2019-10-27 DIAGNOSIS — E1165 Type 2 diabetes mellitus with hyperglycemia: Secondary | ICD-10-CM | POA: Diagnosis not present

## 2019-11-13 DIAGNOSIS — G4733 Obstructive sleep apnea (adult) (pediatric): Secondary | ICD-10-CM | POA: Diagnosis not present

## 2019-11-13 DIAGNOSIS — G4737 Central sleep apnea in conditions classified elsewhere: Secondary | ICD-10-CM | POA: Diagnosis not present

## 2019-11-21 DIAGNOSIS — L03032 Cellulitis of left toe: Secondary | ICD-10-CM | POA: Diagnosis not present

## 2019-11-21 DIAGNOSIS — L600 Ingrowing nail: Secondary | ICD-10-CM | POA: Diagnosis not present

## 2019-12-06 ENCOUNTER — Other Ambulatory Visit: Payer: Self-pay

## 2019-12-06 ENCOUNTER — Ambulatory Visit: Payer: Self-pay

## 2019-12-06 DIAGNOSIS — L600 Ingrowing nail: Secondary | ICD-10-CM | POA: Diagnosis not present

## 2019-12-06 NOTE — Patient Outreach (Signed)
  [facility]) Care Management Chronic Special Needs Program    12/06/2019  Name: Ivan Parks, DOB: 06-Jul-1940  MRN: [id number]   [name] is enrolled in a chronic special needs plan for Diabetes. Telephone call to client for CSNP assessment follow up. Unable to reach. HIPAA compliant voice message left with call back phone number and return call request.   PLAN;  RNCM will attempt 2nd telephone call to client within 1 week.   George Ina RN,BSN,CCM Chronic Care Management Coordinator [facility] Care Management [phone]

## 2019-12-11 ENCOUNTER — Other Ambulatory Visit: Payer: Self-pay

## 2019-12-11 NOTE — Patient Outreach (Signed)
  [facility]) Care Management Chronic Special Needs Program    12/11/2019  Name: Ivan Parks, DOB: [birth year]  MRN: [id number]   [name] is enrolled in a chronic special needs plan for Diabetes. Telephone call to client for CSNP assessment follow up. Call answered by wife, Ivan Parks.  Wife states, " my phone is about to go out your going to have to call back later."  HIPAA message left with call back phone number.   PLAN; RNCM will attempt 3rd telephone outreach to client in 1 week.   George Ina RN,BSN,CCM Chronic Care Management Coordinator [facility] Care Management [phone]

## 2019-12-13 DIAGNOSIS — G4737 Central sleep apnea in conditions classified elsewhere: Secondary | ICD-10-CM | POA: Diagnosis not present

## 2019-12-13 DIAGNOSIS — Z794 Long term (current) use of insulin: Secondary | ICD-10-CM | POA: Diagnosis not present

## 2019-12-13 DIAGNOSIS — G4733 Obstructive sleep apnea (adult) (pediatric): Secondary | ICD-10-CM | POA: Diagnosis not present

## 2019-12-13 DIAGNOSIS — E1151 Type 2 diabetes mellitus with diabetic peripheral angiopathy without gangrene: Secondary | ICD-10-CM | POA: Diagnosis not present

## 2019-12-13 DIAGNOSIS — J449 Chronic obstructive pulmonary disease, unspecified: Secondary | ICD-10-CM | POA: Diagnosis not present

## 2019-12-13 DIAGNOSIS — E785 Hyperlipidemia, unspecified: Secondary | ICD-10-CM | POA: Diagnosis not present

## 2019-12-13 DIAGNOSIS — L03116 Cellulitis of left lower limb: Secondary | ICD-10-CM | POA: Diagnosis not present

## 2019-12-13 DIAGNOSIS — R413 Other amnesia: Secondary | ICD-10-CM | POA: Diagnosis not present

## 2019-12-13 DIAGNOSIS — I10 Essential (primary) hypertension: Secondary | ICD-10-CM | POA: Diagnosis not present

## 2019-12-13 DIAGNOSIS — D696 Thrombocytopenia, unspecified: Secondary | ICD-10-CM | POA: Diagnosis not present

## 2019-12-15 ENCOUNTER — Other Ambulatory Visit: Payer: Self-pay

## 2019-12-15 DIAGNOSIS — J449 Chronic obstructive pulmonary disease, unspecified: Secondary | ICD-10-CM | POA: Diagnosis not present

## 2019-12-15 DIAGNOSIS — I10 Essential (primary) hypertension: Secondary | ICD-10-CM | POA: Diagnosis not present

## 2019-12-15 DIAGNOSIS — E785 Hyperlipidemia, unspecified: Secondary | ICD-10-CM | POA: Diagnosis not present

## 2019-12-15 DIAGNOSIS — J441 Chronic obstructive pulmonary disease with (acute) exacerbation: Secondary | ICD-10-CM | POA: Diagnosis not present

## 2019-12-15 DIAGNOSIS — E1165 Type 2 diabetes mellitus with hyperglycemia: Secondary | ICD-10-CM | POA: Diagnosis not present

## 2019-12-15 DIAGNOSIS — E1151 Type 2 diabetes mellitus with diabetic peripheral angiopathy without gangrene: Secondary | ICD-10-CM | POA: Diagnosis not present

## 2019-12-15 NOTE — Patient Outreach (Addendum)
[facility]) Care Management Chronic Special Needs Program  12/15/2019  Name: Ivan Parks DOB: Apr 18, 1941  MRN: [id number]  [name] is enrolled in a chronic special needs plan for Diabetes. Telephone call to client's wife and designated party release, Ivan Parks. HIPAA verified by wife for client. Wife states client is doing well. Wife states, " he is getting along better than I am."  Wife states client is taking his medication as prescribed. She reports filling his pill box weekly.   Wife states client was recently seen by his doctor for his big toe. She reports he was given antibiotics to treat. Wife states client does not take diabetic medication and is not checking his blood sugars. She reports his doctor is aware. Wife states client is "stubborn and does what he wants to."  Wife denies need for Health coach.  Wife reports client received his blood pressure monitor on yesterday. She reports either her son or daughter will assist client with monitoring his blood pressure. Wife states client's daughter provides transportation for and accompanies client to his doctor appointments. She reports daughter, Ivan Parks, will know more about the doctors recommendations for clients care.   RNCM attempted to call daughter. Unable to reach or leave voice message due to mailbox being full.  RNCM contacted clients primary care provider office to discuss diabetes treatment plan. Message left with [name] for doctor.  RNCM contacted client's endocrinology provider office and spoke with [name]. [name] states client's last office visit was 10/28/19.   Received return call from [name] with primary care provider office. Notified [name] that client is not taking any diabetes oral or insulin medication, is not checking his blood sugars daily and has not seen the endocrinologist since 10/28/19. [name] states she will provide this information to client's primary provider   Goals Addressed                This Visit's Progress   .  Caregiver will report client's toe has healed wthin 3 months.        Take prescribed antibiotic as recommended by your doctor Contact your doctor if you have signs/ or symptoms of infection:  fever, redness, increased swelling, drainage, odor,  or increased pain.  Contact your doctor for questions Follow up with your doctor as recommended.     .  Client understands the importance of follow-up with providers by attending scheduled visits   On track     Most recent primary care provider office. 12/13/19 Endocrinologist visit.10/28/18 Wife states daughter, Ivan Parks provides and accompanies client to his doctor appointments.       .  Client verbalize knowledge of Heart Failure disease self management skills in 6 months        Heart failure symptoms discussed. Caregiver states an understanding Review Health Team advantage calendar sent in the mail for Heart failure information RN case manager will send client education article: Heart failure adult.     .  COMPLETED: Client will verbalize knowledge of chronic lung disease as evidenced by no ED visits or Inpatient stays related to chronic lung disease         Wife denies client having any hospital admissions related to chronic lung disease in past 6 months.     .  Client will verbalize knowledge of self management of Hypertension as evidences by BP reading of 140/90 or less; or as defined by provider   On track     Plan to check blood  pressure regularly. Have caregiver/ family member assist you with checking your blood pressure. Write results in your Health Team [employer]vant[employer] calendar.  Continue to take your medication as prescribed Do not skip doses of blood pressure medicine. The medicine does not work as well if yo skip doses.  Skipping doses also puts you at risk for problems.  Plan to eat low salt and heart healthy meals full of fruits, vegetables, whole grains, lean protein and limit fat and sugars.   Call your doctor for questions Attend follow up appointments as recommended. RN case manager will send client education article: High blood pressure in adults.     .  Client's wife and/ or daughter will report client completed eye exam within 3 months. (pt-stated)   Not on track     RN case manager re-discussed importance of annual eye exam for client with wife.  Your last documented eye exam was 07/16/2016 Diabetes can affect your vision.  Plan to have a dilated eye exam every year.     .  Client/Caregiver will verbalize understanding of instructions related to self-care and safety   On track     Continue to use cane/ walker as advised by your doctor . Inside your home: Don't use throw rugs, use plenty of lighting, keep walkways clear of clutter, and remove tripping hazards such as cords. Be aware of small pets when walking. . For outside safety:  Slow down and look ahead, avoid problem areas avoid uneven or slippery pavement, watch out for slippery flooring, especially polished surfaces, wear proper non-rubber soled shoes, rather than flip flops or high heels and carry a cell phone and/ or medical alert device with you.  Preventing falls in older adults.           Marland Kitchen  HEMOGLOBIN A1C < 8        Your most recent Hgb A1c was 12.7 on 12/13/19. It is important to check you blood sugars daily before eating with a goal of 80-130.  You can also check 1 1/2 hours after eating with a goal of 180 or less.  Plan to eat low carboydrates and low salt meals, watch portion sizes and avoid sugar sweetended drinks.   Please take diabetic medication if recommended by your doctor.  Review Health team advantage calendar sent in the mail for Diabetes Action plan Exercises only if you are able to and / or recommended by your doctor.  RN case manager contacted client's primary care provider to discuss client's diabetes plan of care     .  Maintain timely refills of diabetic medication as prescribed within the  year .   On track     Wife reports client has his medications refilled timely. Wife reports she fills client's pill box weekly and makes sure he is taking his medications.  Contact your RN case manager [phone]) if you have difficulty obtaining your medications Contact your doctor if you have questions concerning your medications.     .  Obtain annual  Lipid Profile, LDL-C   On track     Lipid panel was completed on 03/08/19  Avoid saturated fats, trans-fats and eat more fiber. RN case manager will send client education article: Low cholesterol, saturated fat, and tran-fat diet.      .  Obtain Annual Eye (retinal)  Exam    Not on track      RN case manager re-discussed importance of annual eye exam for client with wife.  Your last documented eye  exam was 07/16/2016 Diabetes can affect your vision.  Plan to have a dilated eye exam every year.      .  Obtain Annual Foot Exam   On track     most recent diabetic foot exam on 12/13/19 Re-discussed diabetes foot care - Check feet daily at home (look for skin color changes, cuts, sores or cracks in the skin, swelling of feet or ankles, ingrown or fungal toenails, corn or calluses). Report these findings to your doctor - Wash feet with soap and water, dry feet well especially between toes - Moisturize your feet but not between the toes - Always wear shoes that protect your whole feet.  Goal renewed 2021     .  Obtain annual screen for micro albuminuria (urine) , nephropathy (kidney problems)   Not on track     Per chart review, no results found for micro Albuminuria Diabetes can affect your kidneys It is important for your doctor to check your urine at least once a year. These test show how your kidneys are working.  Rn case manager will send client education article: Urine Albumin      .  Obtain Hemoglobin A1C at least 2 times per year   On track     Hgb A1c completed  on 12/13/19 with result of 12/7% and 11/16/18 with result of 8.1%  Discussed with wife importance of client having consistent follow up with provider for exams and lab work.      .  Visit Primary Care Provider or Endocrinologist at least 2 times per year    On track     Most recent primary care provider office. 12/13/19 Endocrinologist visit.10/28/18 Wife states daughter, Ivan Parks provides and accompanies client to his doctor appointments.        Plan:  Send successful outreach letter with a copy of their individualized care plan, Send individual care plan to provider and Send educational material  RNCM will await follow up call from primary care provider office.   Chronic care management coordinator will outreach in:  3 Months   George Ina RN,BSN,CCM Chronic Care Management Coordinator [facility] Care Management [phone]    .

## 2019-12-18 ENCOUNTER — Other Ambulatory Visit: Payer: Self-pay

## 2019-12-18 NOTE — Patient Outreach (Addendum)
[facility]) Care Management Chronic Special Needs Program    12/18/2019  Name: Ivan Parks, DOB: 10-27-1940  MRN: [id number]   Mr. Kha Hari is enrolled in a chronic special needs plan for Diabetes. Telephone message received from [place] with clients primary care provider. Primary care provider request ongoing encouragement with client to wear leg compression wraps and  daughter has been advised to schedule endocrinology visit for client.   Goals      Caregiver will report client's toe has healed wthin 3 months.      Take prescribed antibiotic as recommended by your doctor Contact your doctor if you have signs/ or symptoms of infection:  fever, redness, increased swelling, drainage, odor,  or increased pain.  Contact your doctor for questions Follow up with your doctor as recommended.       caregiver will report scheduling endocrinologist appointment in 3 months.      Plan to schedule endocrinology visit as recommended by your primary care provider.  Contact your primary care provider if you questions.  Contact your assigned RN case manager if you have difficulty with scheduling your endocrinology visit.       Client understands the importance of follow-up with providers by attending scheduled visits      Most recent primary care provider office. 12/13/19 Endocrinologist visit.10/28/18 Wife states daughter, [name] provides and accompanies client to his doctor appointments.         Client verbalize knowledge of Heart Failure disease self management skills in 6 months      Heart failure symptoms discussed. Caregiver states an understanding Review Health [employer] calendar sent in the mail for Heart failure information RN case manager will send client education article: Heart failure adult.       Client will report wearing leg compression wraps in 3 months      Wear leg compression wraps as recommended by your doctor. Contact your doctor if you  have questions.       Client will verbalize knowledge of self management of Hypertension as evidences by BP reading of 140/90 or less; or as defined by provider      Plan to check blood pressure regularly. Have caregiver/ family member assist you with checking your blood pressure. Write results in your [employer] calendar.  Continue to take your medication as prescribed Do not skip doses of blood pressure medicine. The medicine does not work as well if yo skip doses.  Skipping doses also puts you at risk for problems.  Plan to eat low salt and heart healthy meals full of fruits, vegetables, whole grains, lean protein and limit fat and sugars.  Call your doctor for questions Attend follow up appointments as recommended. RN case manager will send client education article: High blood pressure in adults.       Client's wife and/ or daughter will report client completed eye exam within 3 months. (pt-stated)      RN case manager re-discussed importance of annual eye exam for client with wife.  Your last documented eye exam was 07/16/2016 Diabetes can affect your vision.  Plan to have a dilated eye exam every year.       Client/Caregiver will verbalize understanding of instructions related to self-care and safety      Continue to use cane/ walker as advised by your doctor  Inside your home: Don't use throw rugs, use plenty of lighting, keep walkways clear of clutter, and remove tripping hazards such as cords.  Be aware of small pets when walking.  For outside safety:  Slow down and look ahead, avoid problem areas avoid uneven or slippery pavement, watch out for slippery flooring, especially polished surfaces, wear proper non-rubber soled shoes, rather than flip flops or high heels and carry a cell phone and/ or medical alert device with you.  Preventing falls in older adults.             HEMOGLOBIN A1C < 8      Your most recent Hgb A1c was 12.7 on 12/13/19. It is important to  check you blood sugars daily before eating with a goal of 80-130.  You can also check 1 1/2 hours after eating with a goal of 180 or less.  Plan to eat low carboydrates and low salt meals, watch portion sizes and avoid sugar sweetended drinks.   Please take diabetic medication if recommended by your doctor.  Review Health team advantage calendar sent in the mail for Diabetes Action plan Exercises only if you are able to and / or recommended by your doctor.  RN case manager contacted client's primary care provider to discuss client's diabetes plan of care       Maintain timely refills of diabetic medication as prescribed within the year .      Wife reports client has his medications refilled timely. Wife reports she fills client's pill box weekly and makes sure he is taking his medications.  Contact your RN case manager [phone]) if you have difficulty obtaining your medications Contact your doctor if you have questions concerning your medications.       Obtain annual  Lipid Profile, LDL-C      Lipid panel was completed on 03/08/19  Avoid saturated fats, trans-fats and eat more fiber. RN case manager will send client education article: Low cholesterol, saturated fat, and tran-fat diet.        Obtain Annual Eye (retinal)  Exam        RN case manager re-discussed importance of annual eye exam for client with wife.  Your last documented eye exam was 07/16/2016 Diabetes can affect your vision.  Plan to have a dilated eye exam every year.        Obtain Annual Foot Exam      most recent diabetic foot exam on 12/13/19 Re-discussed diabetes foot care - Check feet daily at home (look for skin color changes, cuts, sores or cracks in the skin, swelling of feet or ankles, ingrown or fungal toenails, corn or calluses). Report these findings to your doctor - Wash feet with soap and water, dry feet well especially between toes - Moisturize your feet but not between the toes - Always wear shoes  that protect your whole feet.  Goal renewed 2021       Obtain annual screen for micro albuminuria (urine) , nephropathy (kidney problems)      Per chart review, no results found for micro Albuminuria Diabetes can affect your kidneys It is important for your doctor to check your urine at least once a year. These test show how your kidneys are working.  Rn case manager will send client education article: Urine Albumin        Obtain Hemoglobin A1C at least 2 times per year      Hgb A1c completed  on 12/13/19 with result of 12/7% and 11/16/18 with result of 8.1% Discussed with wife importance of client having consistent follow up with provider for exams and lab work.  Visit Primary Care Provider or Endocrinologist at least 2 times per year       Most recent primary care provider office. 12/13/19 Endocrinologist visit.10/28/18 Wife states daughter, [name] provides and accompanies client to his doctor appointments.         PLAN;   RNCM updated individualized care plan  RNCM will send updated individualized care plan to client and primary care provider.   George Ina RN,BSN,CCM Chronic Care Management Coordinator [facility] Care Management [phone]

## 2019-12-26 ENCOUNTER — Other Ambulatory Visit: Payer: Self-pay | Admitting: Endocrinology

## 2020-01-02 DIAGNOSIS — J441 Chronic obstructive pulmonary disease with (acute) exacerbation: Secondary | ICD-10-CM | POA: Diagnosis not present

## 2020-01-02 DIAGNOSIS — E1165 Type 2 diabetes mellitus with hyperglycemia: Secondary | ICD-10-CM | POA: Diagnosis not present

## 2020-01-02 DIAGNOSIS — E785 Hyperlipidemia, unspecified: Secondary | ICD-10-CM | POA: Diagnosis not present

## 2020-01-02 DIAGNOSIS — E1151 Type 2 diabetes mellitus with diabetic peripheral angiopathy without gangrene: Secondary | ICD-10-CM | POA: Diagnosis not present

## 2020-01-02 DIAGNOSIS — J449 Chronic obstructive pulmonary disease, unspecified: Secondary | ICD-10-CM | POA: Diagnosis not present

## 2020-01-02 DIAGNOSIS — I10 Essential (primary) hypertension: Secondary | ICD-10-CM | POA: Diagnosis not present

## 2020-01-15 ENCOUNTER — Encounter: Payer: Self-pay | Admitting: Endocrinology

## 2020-01-15 ENCOUNTER — Other Ambulatory Visit: Payer: Self-pay

## 2020-01-15 ENCOUNTER — Ambulatory Visit (INDEPENDENT_AMBULATORY_CARE_PROVIDER_SITE_OTHER): Payer: HMO | Admitting: Endocrinology

## 2020-01-15 VITALS — BP 124/78 | HR 103 | Ht 65.0 in | Wt 173.0 lb

## 2020-01-15 DIAGNOSIS — N182 Chronic kidney disease, stage 2 (mild): Secondary | ICD-10-CM | POA: Diagnosis not present

## 2020-01-15 DIAGNOSIS — E1122 Type 2 diabetes mellitus with diabetic chronic kidney disease: Secondary | ICD-10-CM

## 2020-01-15 DIAGNOSIS — Z794 Long term (current) use of insulin: Secondary | ICD-10-CM | POA: Diagnosis not present

## 2020-01-15 LAB — POCT GLYCOSYLATED HEMOGLOBIN (HGB A1C): Hemoglobin A1C: 10.9 % — AB (ref 4.0–5.6)

## 2020-01-15 MED ORDER — TRESIBA FLEXTOUCH 100 UNIT/ML ~~LOC~~ SOPN: 15.0000 [IU] | PEN_INJECTOR | Freq: Every day | SUBCUTANEOUS | 5 refills | Status: AC

## 2020-01-15 NOTE — Progress Notes (Signed)
Subjective:    Patient ID: Ivan Parks, male    DOB: 01-21-1941, 79 y.o.   MRN: [id number]  HPI Pt returns for f/u of diabetes mellitus: DM type: Insulin-requiring type 2.  Dx'ed: 2010 Complications: polyneuropathy and PAD.   Therapy: insulin since 2016.   DKA: never Severe hypoglycemia: last episode was early 2020.   Pancreatitis: never.   Other: he takes QD insulin, due to h/o noncompliance; he takes human insulin, due to cost; he has long duration of action of insulin; NPH was changed to 70/30, due to AM hypoglycemia and PM hyperglycemia; rx is limited by memory loss; dtr brings him to appts, but he declines help from family at home.  Interval history: she is here with stepdtr Ivan Parks), who says pt has not recently taken insulin.  pt states she feels well in general.  Pt no longer has a meter to check cbg.  I spoke to dtr Ivan Parks) by phone, who says she can only give pt insulin in the afternoon.   Past Medical History:  Diagnosis Date  . Acute respiratory failure (HCC)   . AKI (acute kidney injury) (HCC) 04/01/2016  . Bilateral lower extremity edema   . Bronchitis   . Chest pain 12/02/2017  . CHF (congestive heart failure) (HCC)   . Chronic respiratory failure (HCC) 11/26/2014  . COPD (chronic obstructive pulmonary disease) (HCC)   . 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 Dementia (HCC)   . Diabetes (HCC) 05/02/2016  . DM (diabetes mellitus) (HCC)   . Essential hypertension 03/06/2015   hypertension   . HTN (hypertension)   . Hyperlipidemia   . Hypotension 03/31/2016  . Hypoxemia   . Leg edema, right- Greater than Left leg 07/05/2014   Lower extremity edema   . OSA (obstructive sleep apnea)   . Peripheral vascular disease (HCC)   . Tobacco abuse     Past Surgical History:  Procedure Laterality Date  . COLON  SURGERY      Social History   Socioeconomic History  . Marital status: Married    Spouse name: Not on file  . Number of children: 7  . Years of education: Not on file  . Highest education level: Not on file  Occupational History  . Occupation: retired  Tobacco Use  . Smoking status: Former Smoker    Packs/day: 1.50    Years: 50.00    Pack years: 75.00    Types: Cigarettes    Quit date: 11/04/2010    Years since quitting: 9.2  . Smokeless tobacco: Never Used  Vaping Use  . Vaping Use: Never used  Substance and Sexual Activity  . Alcohol use: No    Alcohol/week: 0.0 standard drinks  . Drug use: No  . Sexual activity: Not on file  Other Topics Concern  . Not on file  Social History Narrative   Lives with wife and 2 adult children who help with driving errands, cooking, cleaning, hands on care as needed.    Daughter Ivan Parks does daily dressing changes to client's bilateral lower extremeties   Social Determinants of Health   Financial Resource Strain: Medium Risk  . Difficulty of Paying Living Expenses: Somewhat hard  Food Insecurity: No Food Insecurity  . Worried About Programme researcher, broadcasting/film/video in the Last Year: Never  true  . Ran Out of Food in the Last Year: Never true  Transportation Needs: No Transportation Needs  . Lack of Transportation (Medical): No  . Lack of Transportation (Non-Medical): No  Physical Activity: Inactive  . Days of Exercise per Week: 0 days  . Minutes of Exercise per Session: 0 min  Stress: No Stress Concern Present  . Feeling of Stress : Only a little  Social Connections: Unknown  . Frequency of Communication with Friends and Family: More than three times a week  . Frequency of Social Gatherings with Friends and Family: More than three times a week  . Attends Religious Services: Not on file  . Active Member of Clubs or Organizations: Not on file  . Attends Banker Meetings: Not on file  . Marital Status: Married  Catering manager  Violence:   . Fear of Current or Ex-Partner: Not on file  . Emotionally Abused: Not on file  . Physically Abused: Not on file  . Sexually Abused: Not on file    Current Outpatient Medications on File Prior to Visit  Medication Sig Dispense Refill  . acetaminophen (TYLENOL) 325 MG tablet Take 2 tablets (650 mg total) by mouth every 6 (six) hours as needed for mild pain (or Fever >/= 101).    Marland Kitchen albuterol (PROVENTIL) (2.5 MG/3ML) 0.083% nebulizer solution TAKE 3 MLS (2.5 MG TOTAL) BY NEBULIZATION EVERY 6 (SIX) HOURS AS NEEDED FOR WHEEZING OR SHORTNESS OF BREATH. 375 mL 11  . albuterol (VENTOLIN HFA) 108 (90 Base) MCG/ACT inhaler Inhale 2 puffs into the lungs every 6 (six) hours as needed for wheezing or shortness of breath.    Marland Kitchen atorvastatin (LIPITOR) 20 MG tablet Take 1 tablet (20 mg total) by mouth daily at 6 PM. (Patient taking differently: Take 20 mg by mouth daily. ) 60 tablet 0  . carvedilol (COREG) 12.5 MG tablet Take 1 tablet (12.5 mg total) by mouth 2 (two) times daily with a meal. 60 tablet 0  . donepezil (ARICEPT) 5 MG tablet Take 5 mg by mouth at bedtime.    . furosemide (LASIX) 40 MG tablet Take 1 tablet (40 mg total) by mouth daily. Please make appt for future refills. 90 tablet 0  . lisinopril (ZESTRIL) 20 MG tablet Take 20 mg by mouth daily.    . OXYGEN Inhale 2.5 L into the lungs continuous.    . pantoprazole (PROTONIX) 40 MG tablet Take 1 tablet (40 mg total) by mouth daily. 90 tablet 3  . Tiotropium Bromide-Olodaterol (STIOLTO RESPIMAT) 2.5-2.5 MCG/ACT AERS USE 2 PUFFS ONCE A DAY (Patient taking differently: Inhale 2 puffs into the lungs daily. ) 4 g 5  . vitamin B-12 (CYANOCOBALAMIN) 1000 MCG tablet Take 1,000 mcg by mouth daily.     No current facility-administered medications on file prior to visit.    No Known Allergies  Family History  Problem Relation Age of Onset  . Diabetes Mother   . Hypertension Mother   . Peripheral vascular disease Mother        amputation    . Diabetes Father        Right Leg Amputation-Gangrene  . Hypertension Father   . Peripheral vascular disease Father        amputation  . Cancer Father        Lead Poison-Ca  . Pneumonia Father   . Diabetes Sister   . Hypertension Sister   . Diabetes Daughter   . Hypertension Daughter     BP  124/78   Pulse (!) 103   Ht 5\' 5"  (1.651 m)   Wt 173 lb (78.5 kg)   SpO2 90%   BMI 28.79 kg/m   Review of Systems He has lost 19 lbs since last ov in 2020.      Objective:   Physical Exam VITAL SIGNS:  See vs page.  GENERAL: no distress.   Pulses: left dorsalis pedis is absent (poss due to edema) MSK: no deformity of the left foot CV: 2+ left leg edema Skin:  no ulcer on the left foot.  normal color and temp on the left foot Neuro: sensation is intact to touch on the left foot Ext: There is bilateral onychomycosis of the left foot  toenails.    Right foot is bandaged.    Lab Results  Component Value Date   HGBA1C 10.9 (A) 01/15/2020       Assessment & Plan:  Insulin-requiring type 2 DM, with PAD: uncontrolled.  SDOH: he needs an insulin that can be taken at varying times of day.   Patient Instructions  check your blood sugar once a day.  vary the time of day when you check, between before the 3 meals, and at bedtime.  also check if you have symptoms of your blood sugar being too high or too low.  please keep a record of the readings and bring it to your next appointment here (or you can bring the meter itself).  You can write it on any piece of paper.  please call 01/17/2020 sooner if your blood sugar goes below 70, or if you have a lot of readings over 200.   Here are some phone numbers, for a nre meter and strips Please change the insulin to [place] only, 15 units per day.  You can take this at any time of day.   On this type of insulin schedule, you should eat meals on a regular schedule (especially lunch).  If a meal is missed or significantly delayed, your blood sugar could go  low.   Please come back for a follow-up appointment in 1 month.

## 2020-01-15 NOTE — Patient Instructions (Addendum)
check your blood sugar once a day.  vary the time of day when you check, between before the 3 meals, and at bedtime.  also check if you have symptoms of your blood sugar being too high or too low.  please keep a record of the readings and bring it to your next appointment here (or you can bring the meter itself).  You can write it on any piece of paper.  please call us sooner if your blood sugar goes below 70, or if you have a lot of readings over 200.   Here are some phone numbers, for a nre meter and strips Please change the insulin to [place] only, 15 units per day.  You can take this at any time of day.   On this type of insulin schedule, you should eat meals on a regular schedule (especially lunch).  If a meal is missed or significantly delayed, your blood sugar could go low.   Please come back for a follow-up appointment in 1 month.

## 2020-01-19 ENCOUNTER — Telehealth: Payer: Self-pay | Admitting: Endocrinology

## 2020-01-19 ENCOUNTER — Other Ambulatory Visit: Payer: Self-pay

## 2020-01-19 DIAGNOSIS — E1122 Type 2 diabetes mellitus with diabetic chronic kidney disease: Secondary | ICD-10-CM

## 2020-01-19 MED ORDER — PEN NEEDLES 31G X 6 MM MISC: 1.0000 | Freq: Every day | 11 refills | Status: AC

## 2020-01-19 NOTE — Telephone Encounter (Signed)
Vernona Rieger with [facility] called to request a RX for needles for insulin degludec (TRESIBA FLEXTOUCH) 100 UNIT/ML FlexTouch Pen be sent to: [facility] - [place], [place] - [address] Phone:  [phone]  Fax:  [phone]     Vernona Rieger states patient received the RX for Tresiba Flextouch Pen on 01/15/20 but PHARM did not have a RX for the needles.

## 2020-01-19 NOTE — Telephone Encounter (Signed)
Rx sent 

## 2020-01-30 ENCOUNTER — Other Ambulatory Visit: Payer: Self-pay | Admitting: Critical Care Medicine

## 2020-01-30 ENCOUNTER — Ambulatory Visit: Payer: HMO

## 2020-01-30 ENCOUNTER — Other Ambulatory Visit: Payer: HMO

## 2020-01-30 DIAGNOSIS — Z20822 Contact with and (suspected) exposure to covid-19: Secondary | ICD-10-CM

## 2020-02-01 LAB — NOVEL CORONAVIRUS, NAA: SARS-CoV-2, NAA: NOT DETECTED

## 2020-02-01 LAB — SARS-COV-2, NAA 2 DAY TAT

## 2020-02-05 ENCOUNTER — Encounter: Payer: Self-pay | Admitting: Orthopedic Surgery

## 2020-02-05 ENCOUNTER — Ambulatory Visit (INDEPENDENT_AMBULATORY_CARE_PROVIDER_SITE_OTHER): Payer: HMO | Admitting: Orthopedic Surgery

## 2020-02-05 DIAGNOSIS — I872 Venous insufficiency (chronic) (peripheral): Secondary | ICD-10-CM

## 2020-02-05 DIAGNOSIS — I890 Lymphedema, not elsewhere classified: Secondary | ICD-10-CM

## 2020-02-06 ENCOUNTER — Ambulatory Visit: Payer: HMO

## 2020-02-06 ENCOUNTER — Encounter: Payer: Self-pay | Admitting: Orthopedic Surgery

## 2020-02-06 NOTE — Progress Notes (Signed)
Office Visit Note   Patient: Ivan Parks           Date of Birth: 08-28-1940           MRN: [id number] Visit Date: 02/05/2020              Requested by: Laurann Montana, MD [phone] [address] [place],  [place] [address] PCP: Laurann Montana, MD  Chief Complaint  Patient presents with  . Right Foot - Pain  . Left Foot - Pain  . Left Leg - Pain  . 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  Patient has multiple modalities at home to treat the venous and lymphatic insufficiency but he will not use them.  Patient has lymphedema pumps CircAid wraps as well as compression stockings.  Patient keeps his legs dependent.  Patient also complains of nail avulsions from both great toenails and he states the right great toenail [facility].  Patient states that he had been seen at the wound center but he states that they would not follow him when Covid restrictions started.  Assessment & Plan: Visit Diagnoses:  1. Lymphedema   2. Venous insufficiency (chronic) (peripheral)     Plan: Patient's family was with him discussed the importance of elevation we will apply a Dynaflex wrap to both lower extremities follow-up Monday and Thursday to get the swelling sufficiently decreased that he could resume using his CircAid wraps and lymphedema pumps.  Follow-Up Instructions: Return in about 1 week (around 02/12/2020) for Follow-up twice a week on Monday and Thursday for compression wraps.Gaylord Shih Exam  Patient is alert, oriented, no adenopathy, well-dressed, normal affect, normal respiratory effort. Examination patient does not have palpable pulses he has massive venous and lymphatic swelling of both lower extremities with brawny skin color changes up to the tibial tubercle there is no redness no cellulitis no drainage no signs of infection.  Examination the toenails there  is healthy granulation tissue at the nail bed for both the right and left great toe there is no exposed bone no cellulitis no drainage.  Imaging: No results found. No images are attached to the encounter.  Labs: Lab Results  Component Value Date   HGBA1C 10.9 (A) 01/15/2020   HGBA1C 8.1 (H) 11/16/2018   HGBA1C 8.3 (A) 10/28/2018   ESRSEDRATE 4 11/09/2010   REPTSTATUS 11/20/2018 FINAL 11/15/2018   GRAMSTAIN  11/04/2010    MODERATE WBC PRESENT, PREDOMINANTLY PMN NO SQUAMOUS EPITHELIAL CELLS SEEN FEW GRAM NEGATIVE RODS   CULT  11/15/2018    NO GROWTH 5 DAYS Performed at [facility], [address] [place], [place] [address]      Lab Results  Component Value Date   ALBUMIN 3.3 (L) 04/07/2019   ALBUMIN 3.1 (L) 12/14/2018   ALBUMIN 2.8 (L) 11/19/2018    Lab Results  Component Value Date   MG 1.8 11/21/2018   MG 1.7 11/20/2018   MG 2.1 11/04/2010   No results found for: VD25OH  No results found for: PREALBUMIN CBC EXTENDED Latest Ref Rng & Units 04/07/2019 04/07/2019 12/14/2018  WBC 4.0 - 10.5 K/uL - 8.2 -  RBC 4.22 - 5.81 MIL/uL - 4.74 -  HGB 13.0 - 17.0 g/dL 22.6 33.3 54.5  HCT 39 - 52 % 42.0 43.3 45.0  PLT 150 - 400 K/uL - 187 -  NEUTROABS 1.7 - 7.7 K/uL - - -  LYMPHSABS 0.7 - 4.0 K/uL - - -     There is no height or weight on file to calculate BMI.  Orders:  No orders of the defined types were placed in this encounter.  No orders of the defined types were placed in this encounter.    Procedures: No procedures performed  Clinical Data: No additional findings.  ROS:  All other systems negative, except as noted in the HPI. Review of Systems  Objective: Vital Signs: There were no vitals taken for this visit.  Specialty Comments:  No specialty comments available.  PMFS History: Patient Active Problem List   Diagnosis Date Noted  . Infection 11/15/2018  . 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 Chest pain 12/02/2017  . Peripheral  vascular disease (HCC)   . Hyperlipidemia   . CHF (congestive heart failure) (HCC)   . Diabetes (HCC) 05/02/2016  . AKI (acute kidney injury) (HCC) 04/01/2016  . Hypotension 03/31/2016  . Essential hypertension 03/06/2015  . Chronic respiratory failure (HCC) 11/26/2014  . Leg edema, right- Greater than Left leg 07/05/2014  . Allergic rhinitis 11/09/2012  . 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 Acute respiratory failure (HCC)   . AKI (acute kidney injury) (HCC) 04/01/2016  . Bilateral lower extremity edema   . Bronchitis   . Chest pain 12/02/2017  . CHF (congestive heart failure) (HCC)   . Chronic respiratory failure (HCC) 11/26/2014  . COPD (chronic obstructive pulmonary disease) (HCC)   . 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 Dementia (HCC)   . Diabetes (HCC) 05/02/2016  . DM (diabetes mellitus) (HCC)   . Essential hypertension 03/06/2015   hypertension   . HTN (hypertension)   . Hyperlipidemia   . Hypotension 03/31/2016  . Hypoxemia   . Leg edema, right- Greater than Left leg 07/05/2014   Lower extremity edema   . OSA (obstructive sleep apnea)   . Peripheral vascular disease (HCC)   . Tobacco abuse     Family History  Problem Relation Age of Onset  . Diabetes Mother   . Hypertension Mother   . Peripheral vascular disease Mother        amputation  . Diabetes Father        Right Leg Amputation-Gangrene  . Hypertension Father   . Peripheral vascular disease Father        amputation  . Cancer Father        Lead Poison-Ca  . Pneumonia Father   . Diabetes Sister   . Hypertension Sister   . Diabetes Daughter   . Hypertension Daughter     Past Surgical History:  Procedure Laterality Date  . COLON SURGERY     Social History   Occupational History  . Occupation: retired  Tobacco Use  .  Smoking status: Former Smoker    Packs/day: 1.50    Years: 50.00    Pack years: 75.00    Types: Cigarettes    Quit date: 11/04/2010    Years since quitting: 9.2  . Smokeless tobacco: Never Used  Vaping Use  . Vaping Use: Never used  Substance and Sexual Activity  . Alcohol use: No    Alcohol/week: 0.0 standard drinks  . Drug use: No  .  Sexual activity: Not on file

## 2020-02-08 ENCOUNTER — Ambulatory Visit: Payer: HMO | Admitting: Physician Assistant

## 2020-02-08 ENCOUNTER — Encounter: Payer: Self-pay | Admitting: Physician Assistant

## 2020-02-08 DIAGNOSIS — I739 Peripheral vascular disease, unspecified: Secondary | ICD-10-CM

## 2020-02-08 NOTE — Progress Notes (Signed)
Office Visit Note   Patient: Ivan Parks           Date of Birth: 1941/01/06           MRN: [id number] Visit Date: 02/08/2020              Requested by: Laurann Montana, MD [phone] [address] [place],  [place] [address] PCP: Laurann Montana, MD  Chief Complaint  Patient presents with  . Right Leg - Follow-up  . Left Leg - Follow-up      HPI: 79 year old gentleman follow-up for bilateral lower extremity lymphedema.  Also macerated toe nails.  Patient is doing well  Assessment & Plan: Visit Diagnoses: No diagnosis found.  Plan: Significant improvement.  I spoke with the patient's stepdaughter she will bring his CircAid wraps to the next visit.  I think at that time we could place him back in his wraps.  Emphasized the importance of changing the Band-Aids on a daily basis  Follow-Up Instructions: No follow-ups on file.   Ortho Exam  Patient is alert, oriented, no adenopathy, well-dressed, normal affect, normal respiratory effort. Wraps were removed significant skin wrinkling no ulcers distally great toenail thewere maceration secondary to Band-Aids and moisture.  No cellulitis  Imaging: No results found. No images are attached to the encounter.  Labs: Lab Results  Component Value Date   HGBA1C 10.9 (A) 01/15/2020   HGBA1C 8.1 (H) 11/16/2018   HGBA1C 8.3 (A) 10/28/2018   ESRSEDRATE 4 11/09/2010   REPTSTATUS 11/20/2018 FINAL 11/15/2018   GRAMSTAIN  11/04/2010    MODERATE WBC PRESENT, PREDOMINANTLY PMN NO SQUAMOUS EPITHELIAL CELLS SEEN FEW GRAM NEGATIVE RODS   CULT  11/15/2018    NO GROWTH 5 DAYS Performed at [facility], [address], [place], [place] [address]      Lab Results  Component Value Date   ALBUMIN 3.3 (L) 04/07/2019   ALBUMIN 3.1 (L) 12/14/2018   ALBUMIN 2.8 (L) 11/19/2018    Lab Results  Component Value Date   MG 1.8 11/21/2018   MG 1.7 11/20/2018   MG 2.1 11/04/2010   No results found for: VD25OH  No results found  for: PREALBUMIN CBC EXTENDED Latest Ref Rng & Units 04/07/2019 04/07/2019 12/14/2018  WBC 4.0 - 10.5 K/uL - 8.2 -  RBC 4.22 - 5.81 MIL/uL - 4.74 -  HGB 13.0 - 17.0 g/dL 62.1 30.8 65.7  HCT 39 - 52 % 42.0 43.3 45.0  PLT 150 - 400 K/uL - 187 -  NEUTROABS 1.7 - 7.7 K/uL - - -  LYMPHSABS 0.7 - 4.0 K/uL - - -     There is no height or weight on file to calculate BMI.  Orders:  No orders of the defined types were placed in this encounter.  No orders of the defined types were placed in this encounter.    Procedures: No procedures performed  Clinical Data: No additional findings.  ROS:  All other systems negative, except as noted in the HPI. Review of Systems  Objective: Vital Signs: There were no vitals taken for this visit.  Specialty Comments:  No specialty comments available.  PMFS History: Patient Active Problem List   Diagnosis Date Noted  . Infection 11/15/2018  . 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 Chest pain 12/02/2017  . Peripheral vascular disease (HCC)   . Hyperlipidemia   . CHF (congestive heart failure) (HCC)   . Diabetes (HCC) 05/02/2016  . AKI (acute kidney injury) (HCC)  04/01/2016  . Hypotension 03/31/2016  . Essential hypertension 03/06/2015  . Chronic respiratory failure (HCC) 11/26/2014  . Leg edema, right- Greater than Left leg 07/05/2014  . Allergic rhinitis 11/09/2012  . 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 Acute respiratory failure (HCC)   . AKI (acute kidney injury) (HCC) 04/01/2016  . Bilateral lower extremity edema   . Bronchitis   . Chest pain 12/02/2017  . CHF (congestive heart failure) (HCC)   . Chronic respiratory failure (HCC) 11/26/2014  . COPD (chronic obstructive pulmonary disease) (HCC)   . 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 Dementia (HCC)     . Diabetes (HCC) 05/02/2016  . DM (diabetes mellitus) (HCC)   . Essential hypertension 03/06/2015   hypertension   . HTN (hypertension)   . Hyperlipidemia   . Hypotension 03/31/2016  . Hypoxemia   . Leg edema, right- Greater than Left leg 07/05/2014   Lower extremity edema   . OSA (obstructive sleep apnea)   . Peripheral vascular disease (HCC)   . Tobacco abuse     Family History  Problem Relation Age of Onset  . Diabetes Mother   . Hypertension Mother   . Peripheral vascular disease Mother        amputation  . Diabetes Father        Right Leg Amputation-Gangrene  . Hypertension Father   . Peripheral vascular disease Father        amputation  . Cancer Father        Lead Poison-Ca  . Pneumonia Father   . Diabetes Sister   . Hypertension Sister   . Diabetes Daughter   . Hypertension Daughter     Past Surgical History:  Procedure Laterality Date  . COLON SURGERY     Social History   Occupational History  . Occupation: retired  Tobacco Use  . Smoking status: Former Smoker    Packs/day: 1.50    Years: 50.00    Pack years: 75.00    Types: Cigarettes    Quit date: 11/04/2010    Years since quitting: 9.2  . Smokeless tobacco: Never Used  Vaping Use  . Vaping Use: Never used  Substance and Sexual Activity  . Alcohol use: No    Alcohol/week: 0.0 standard drinks  . Drug use: No  . Sexual activity: Not on file

## 2020-02-12 ENCOUNTER — Encounter: Payer: Self-pay | Admitting: Physician Assistant

## 2020-02-12 ENCOUNTER — Ambulatory Visit (INDEPENDENT_AMBULATORY_CARE_PROVIDER_SITE_OTHER): Payer: HMO | Admitting: Physician Assistant

## 2020-02-12 VITALS — Ht 65.0 in | Wt 173.0 lb

## 2020-02-12 DIAGNOSIS — I739 Peripheral vascular disease, unspecified: Secondary | ICD-10-CM | POA: Diagnosis not present

## 2020-02-12 NOTE — Progress Notes (Signed)
Office Visit Note   Patient: Ivan Parks           Date of Birth: August 09, 1940           MRN: [id number] Visit Date: 02/12/2020              Requested by: Laurann Montana, MD [phone] [address] [place],  [place] [address] PCP: Laurann Montana, MD  Chief Complaint  Patient presents with  . Right Leg - Follow-up  . Left Leg - Follow-up      HPI: This is a pleasant gentleman who is following up for his bilateral lymphedema.  He has been wearing Dynaflex wraps.  He did bring his CircAid wraps with him today also had removal of both great toenails.  He has been having a Band-Aid with some antibiotic change daily  Assessment & Plan: Visit Diagnoses: No diagnosis found.  Plan: We will reapply his CircAid wraps.  Follow-up in 2 weeks.  Follow-Up Instructions: No follow-ups on file.   Ortho Exam  Patient is alert, oriented, no adenopathy, well-dressed, normal affect, normal respiratory effort. Bilateral lower extremities significant wrinkling right still slightly more swollen than left but both have compressible compartments.  No ulcers skin is overall in good condition he does have 2 great toenails that have been recently removed.  Some maceration but no infection or foul odor or drainage  Imaging: No results found. No images are attached to the encounter.  Labs: Lab Results  Component Value Date   HGBA1C 10.9 (A) 01/15/2020   HGBA1C 8.1 (H) 11/16/2018   HGBA1C 8.3 (A) 10/28/2018   ESRSEDRATE 4 11/09/2010   REPTSTATUS 11/20/2018 FINAL 11/15/2018   GRAMSTAIN  11/04/2010    MODERATE WBC PRESENT, PREDOMINANTLY PMN NO SQUAMOUS EPITHELIAL CELLS SEEN FEW GRAM NEGATIVE RODS   CULT  11/15/2018    NO GROWTH 5 DAYS Performed at [facility], [address], [place], [place] [address]      Lab Results  Component Value Date   ALBUMIN 3.3 (L) 04/07/2019   ALBUMIN 3.1 (L) 12/14/2018   ALBUMIN 2.8 (L) 11/19/2018    Lab Results  Component Value Date   MG 1.8  11/21/2018   MG 1.7 11/20/2018   MG 2.1 11/04/2010   No results found for: VD25OH  No results found for: PREALBUMIN CBC EXTENDED Latest Ref Rng & Units 04/07/2019 04/07/2019 12/14/2018  WBC 4.0 - 10.5 K/uL - 8.2 -  RBC 4.22 - 5.81 MIL/uL - 4.74 -  HGB 13.0 - 17.0 g/dL 48.1 85.6 31.4  HCT 39 - 52 % 42.0 43.3 45.0  PLT 150 - 400 K/uL - 187 -  NEUTROABS 1.7 - 7.7 K/uL - - -  LYMPHSABS 0.7 - 4.0 K/uL - - -     Body mass index is 28.79 kg/m.  Orders:  No orders of the defined types were placed in this encounter.  No orders of the defined types were placed in this encounter.    Procedures: No procedures performed  Clinical Data: No additional findings.  ROS:  All other systems negative, except as noted in the HPI. Review of Systems  Objective: Vital Signs: Ht 5\' 5"  (1.651 m)   Wt 173 lb (78.5 kg)   BMI 28.79 kg/m   Specialty Comments:  No specialty comments available.  PMFS History: Patient Active Problem List   Diagnosis Date Noted  . Infection 11/15/2018  . 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 Chest pain 12/02/2017  .  Peripheral vascular disease (HCC)   . Hyperlipidemia   . CHF (congestive heart failure) (HCC)   . Diabetes (HCC) 05/02/2016  . AKI (acute kidney injury) (HCC) 04/01/2016  . Hypotension 03/31/2016  . Essential hypertension 03/06/2015  . Chronic respiratory failure (HCC) 11/26/2014  . Leg edema, right- Greater than Left leg 07/05/2014  . Allergic rhinitis 11/09/2012  . 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 Acute respiratory failure (HCC)   . AKI (acute kidney injury) (HCC) 04/01/2016  . Bilateral lower extremity edema   . Bronchitis   . Chest pain 12/02/2017  . CHF (congestive heart failure) (HCC)   . Chronic respiratory failure (HCC) 11/26/2014  . COPD (chronic obstructive pulmonary disease) (HCC)   . 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 Dementia (HCC)   . Diabetes (HCC) 05/02/2016  . DM (diabetes mellitus) (HCC)   . Essential hypertension 03/06/2015   hypertension   . HTN (hypertension)   . Hyperlipidemia   . Hypotension 03/31/2016  . Hypoxemia   . Leg edema, right- Greater than Left leg 07/05/2014   Lower extremity edema   . OSA (obstructive sleep apnea)   . Peripheral vascular disease (HCC)   . Tobacco abuse     Family History  Problem Relation Age of Onset  . Diabetes Mother   . Hypertension Mother   . Peripheral vascular disease Mother        amputation  . Diabetes Father        Right Leg Amputation-Gangrene  . Hypertension Father   . Peripheral vascular disease Father        amputation  . Cancer Father        Lead Poison-Ca  . Pneumonia Father   . Diabetes Sister   . Hypertension Sister   . Diabetes Daughter   . Hypertension Daughter     Past Surgical History:  Procedure Laterality Date  . COLON SURGERY     Social History   Occupational History  . Occupation: retired  Tobacco Use  . Smoking status: Former Smoker    Packs/day: 1.50    Years: 50.00    Pack years: 75.00    Types: Cigarettes    Quit date: 11/04/2010    Years since quitting: 9.2  . Smokeless tobacco: Never Used  Vaping Use  . Vaping Use: Never used  Substance and Sexual Activity  . Alcohol use: No    Alcohol/week: 0.0 standard drinks  . Drug use: No  . Sexual activity: Not on file

## 2020-02-13 DIAGNOSIS — E1151 Type 2 diabetes mellitus with diabetic peripheral angiopathy without gangrene: Secondary | ICD-10-CM | POA: Diagnosis not present

## 2020-02-13 DIAGNOSIS — E1165 Type 2 diabetes mellitus with hyperglycemia: Secondary | ICD-10-CM | POA: Diagnosis not present

## 2020-02-13 DIAGNOSIS — E785 Hyperlipidemia, unspecified: Secondary | ICD-10-CM | POA: Diagnosis not present

## 2020-02-13 DIAGNOSIS — J449 Chronic obstructive pulmonary disease, unspecified: Secondary | ICD-10-CM | POA: Diagnosis not present

## 2020-02-13 DIAGNOSIS — I10 Essential (primary) hypertension: Secondary | ICD-10-CM | POA: Diagnosis not present

## 2020-02-13 DIAGNOSIS — J441 Chronic obstructive pulmonary disease with (acute) exacerbation: Secondary | ICD-10-CM | POA: Diagnosis not present

## 2020-02-20 ENCOUNTER — Ambulatory Visit: Payer: HMO | Admitting: Endocrinology

## 2020-02-21 DIAGNOSIS — E1151 Type 2 diabetes mellitus with diabetic peripheral angiopathy without gangrene: Secondary | ICD-10-CM | POA: Diagnosis not present

## 2020-02-21 DIAGNOSIS — E785 Hyperlipidemia, unspecified: Secondary | ICD-10-CM | POA: Diagnosis not present

## 2020-02-21 DIAGNOSIS — I10 Essential (primary) hypertension: Secondary | ICD-10-CM | POA: Diagnosis not present

## 2020-02-21 DIAGNOSIS — J441 Chronic obstructive pulmonary disease with (acute) exacerbation: Secondary | ICD-10-CM | POA: Diagnosis not present

## 2020-02-21 DIAGNOSIS — E1165 Type 2 diabetes mellitus with hyperglycemia: Secondary | ICD-10-CM | POA: Diagnosis not present

## 2020-02-21 DIAGNOSIS — J449 Chronic obstructive pulmonary disease, unspecified: Secondary | ICD-10-CM | POA: Diagnosis not present

## 2020-02-23 ENCOUNTER — Other Ambulatory Visit: Payer: Self-pay

## 2020-02-23 NOTE — Patient Outreach (Signed)
[facility]) Care Management Chronic Special Needs Program  02/23/2020  Name: Ivan Parks DOB: April 14, 1941  MRN: [id number]  Mr. Bert Ptacek is enrolled in a chronic special needs plan for Diabetes. Telephone call to wife, Johanthan Kneeland for client.  HIPAA verified by wife.  Wife states client is doing fine.  Wife states, " he is doing better than me." Wife states client continues to follow up with his doctors regularly. She reports he is not checking his blood sugars as recommended.  Wife states client continues to take his medications as recommended. She states she makes sure he take his medicines.  Wife states  daughter [name] knows more about client's medical conditions and care and request RNCM contact daughter for additional information.  RNCM attempted call to [name] as advised.  Unable to reach. HIPAA compliant voice message left with call back phone number and return call request.   Reviewed and updated care plan.  Subjective: Goals Addressed            This Visit's Progress   . Client/Caregiver will verbalize understanding of instructions related to self-care and safety   On track    Wife denies client having any falls. Continue to use cane/ walker as advised by your doctor Continue to follow fall prevention guidelines as stated below:  . Inside your home: Don't use throw rugs, use plenty of lighting, keep walkways clear of clutter, and remove tripping hazards such as cords. Be aware of small pets when walking. . For outside safety:  Slow down and look ahead, avoid problem areas avoid uneven or slippery pavement, watch out for slippery flooring, especially polished surfaces, wear proper non-rubber soled shoes, rather than flip flops or high heels and carry a cell phone and/ or medical alert device with you.            Marland Kitchen HEMOGLOBIN A1C < 8       Your most recent Hgb A1c was 12.7 on 12/13/19. Continue to follow items stated below:  It is  important to check you blood sugars daily before eating with a goal of 80-130.  You can also check 1 1/2 hours after eating with a goal of 180 or less.  Plan to eat low carboydrates and low salt meals, watch portion sizes and avoid sugar sweetended drinks.   Please take diabetic medication if recommended by your doctor.  Review [employer] calendar sent in the mail for Diabetes Action plan Exercises only if you are able to and / or recommended by your doctor.  Continue to follow up with your endocrinologist as recommended.       . Maintain timely refills of diabetic medication as prescribed within the year .   On track    Wife  continues to report that client has his medications refilled timely. Wife reports she fills client's pill box weekly and makes sure he is taking his medications.  Contact your RN case manager [phone]) if you have difficulty obtaining your medications Contact your doctor if you have questions concerning your medications.     . Obtain annual  Lipid Profile, LDL-C   On track    Lipid panel was completed on 03/08/19 Continue to avoid saturated fats, trans-fats and eat more fiber. Continue to take your statin (cholesterol) medication as prescribed.        . Obtain Annual Eye (retinal)  Exam    Not on track    Schedule an annual eye appointment with  your eye doctor.  Contact your [employer] concierge if you need a list of participating plan providers or information regarding your coverage benefit for an annual exam.  Client's wife request RN case manager discuss this with client's daughter who assists client with management of his medical conditions.  Your last documented eye exam was 07/16/2016      . COMPLETED: Obtain Annual Foot Exam   On track    most recent diabetic foot exam on 01/15/20     . Obtain annual screen for micro albuminuria (urine) , nephropathy (kidney problems)   Not on track    Per chart review, no results found for micro  Albuminuria It is important for your doctor to check your urine at least once a year. These test show how your kidneys are working. Discuss this with your doctor at your next office visit.       Clydene Pugh Hemoglobin A1C at least 2 times per year   On track    Hgb A1c completed  on 12/13/19 with result of 12.7%  Follow up with your provider regularly and have lab work completed as recommended.       . Visit Primary Care Provider or Endocrinologist at least 2 times per year    On track    Most recent primary care provider office. 12/13/19  Endocrinologist visit.10/28/18 and 01/15/20       ASSESSMENT:  Unable to complete assessment and care plan. Wife request daughter be contacted for additional medical information.   Plan:  RNCM will attempt 2nd outreach to client's daughter, [name] to complete assessment in 1 week.   George Ina RN,BSN,CCM Chronic Care Management Coordinator [facility] Care Management [phone]      .

## 2020-02-26 ENCOUNTER — Ambulatory Visit (INDEPENDENT_AMBULATORY_CARE_PROVIDER_SITE_OTHER): Payer: HMO | Admitting: Physician Assistant

## 2020-02-26 ENCOUNTER — Encounter: Payer: Self-pay | Admitting: Physician Assistant

## 2020-02-26 VITALS — Ht 65.0 in | Wt 173.0 lb

## 2020-02-26 DIAGNOSIS — I739 Peripheral vascular disease, unspecified: Secondary | ICD-10-CM

## 2020-02-26 NOTE — Progress Notes (Signed)
Office Visit Note   Patient: Ivan Parks           Date of Birth: Nov 23, 1940           MRN: [id number] Visit Date: 02/26/2020              Requested by: Laurann Montana, MD [phone] [address] [place],  [place] [address] PCP: Laurann Montana, MD  Chief Complaint  Patient presents with  . Left Leg - Follow-up  . Right Leg - Follow-up      HPI: Patient is a pleasant 79 year old gentleman with peripheral vascular disease.  Bilateral lower extremities.  This has been an ongoing problem.  We were doing Dynaflex wraps with him.  Last week it was felt the swelling had decreased enough to go into his CircAid wraps which we placed on him.  He is here for follow-up  Assessment & Plan: Visit Diagnoses: No diagnosis found.  Plan: I think he could continue the CircAid wrap on the left.  However he does have significant swelling in the right leg going into the foot.  I recommended Profore wraps with a new note today and follow-up in 1 week.  Follow-Up Instructions: No follow-ups on file.   Ortho Exam  Patient is alert, oriented, no adenopathy, well-dressed, normal affect, normal respiratory effort. Left lower extremity he does have some wrinkling.  Compartments are soft.  He has a healing macerated area with great toenail was removed.  There is no surrounding cellulitis or concerns for infection at this point.  On the right side he has dry skin changes with significant swelling.  This goes down into his foot but there is no associated cellulitis.  Also some some mild drainage from the area of the toenail removal.  No purulence or surrounding cellulitis.  Imaging: No results found. No images are attached to the encounter.  Labs: Lab Results  Component Value Date   HGBA1C 10.9 (A) 01/15/2020   HGBA1C 8.1 (H) 11/16/2018   HGBA1C 8.3 (A) 10/28/2018   ESRSEDRATE 4 11/09/2010   REPTSTATUS 11/20/2018 FINAL 11/15/2018   GRAMSTAIN  11/04/2010    MODERATE WBC PRESENT, PREDOMINANTLY  PMN NO SQUAMOUS EPITHELIAL CELLS SEEN FEW GRAM NEGATIVE RODS   CULT  11/15/2018    NO GROWTH 5 DAYS Performed at [facility], [address] [place], [place] [address]      Lab Results  Component Value Date   ALBUMIN 3.3 (L) 04/07/2019   ALBUMIN 3.1 (L) 12/14/2018   ALBUMIN 2.8 (L) 11/19/2018    Lab Results  Component Value Date   MG 1.8 11/21/2018   MG 1.7 11/20/2018   MG 2.1 11/04/2010   No results found for: VD25OH  No results found for: PREALBUMIN CBC EXTENDED Latest Ref Rng & Units 04/07/2019 04/07/2019 12/14/2018  WBC 4.0 - 10.5 K/uL - 8.2 -  RBC 4.22 - 5.81 MIL/uL - 4.74 -  HGB 13.0 - 17.0 g/dL 26.3 78.5 88.5  HCT 39 - 52 % 42.0 43.3 45.0  PLT 150 - 400 K/uL - 187 -  NEUTROABS 1.7 - 7.7 K/uL - - -  LYMPHSABS 0.7 - 4.0 K/uL - - -     Body mass index is 28.79 kg/m.  Orders:  No orders of the defined types were placed in this encounter.  No orders of the defined types were placed in this encounter.    Procedures: No procedures performed  Clinical Data: No additional findings.  ROS:  All  other systems negative, except as noted in the HPI. Review of Systems  Objective: Vital Signs: Ht 5\' 5"  (1.651 m)   Wt 173 lb (78.5 kg)   BMI 28.79 kg/m   Specialty Comments:  No specialty comments available.  PMFS History: Patient Active Problem List   Diagnosis Date Noted  . Infection 11/15/2018  . 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 Chest pain 12/02/2017  . Peripheral vascular disease (HCC)   . Hyperlipidemia   . CHF (congestive heart failure) (HCC)   . Diabetes (HCC) 05/02/2016  . AKI (acute kidney injury) (HCC) 04/01/2016  . Hypotension 03/31/2016  . Essential hypertension 03/06/2015  . Chronic respiratory failure (HCC) 11/26/2014  . Leg edema, right- Greater than Left leg 07/05/2014  . Allergic rhinitis 11/09/2012  . 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 Acute respiratory failure (HCC)   .  AKI (acute kidney injury) (HCC) 04/01/2016  . Bilateral lower extremity edema   . Bronchitis   . Chest pain 12/02/2017  . CHF (congestive heart failure) (HCC)   . Chronic respiratory failure (HCC) 11/26/2014  . COPD (chronic obstructive pulmonary disease) (HCC)   . 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 Dementia (HCC)   . Diabetes (HCC) 05/02/2016  . DM (diabetes mellitus) (HCC)   . Essential hypertension 03/06/2015   hypertension   . HTN (hypertension)   . Hyperlipidemia   . Hypotension 03/31/2016  . Hypoxemia   . Leg edema, right- Greater than Left leg 07/05/2014   Lower extremity edema   . OSA (obstructive sleep apnea)   . Peripheral vascular disease (HCC)   . Tobacco abuse     Family History  Problem Relation Age of Onset  . Diabetes Mother   . Hypertension Mother   . Peripheral vascular disease Mother        amputation  . Diabetes Father        Right Leg Amputation-Gangrene  . Hypertension Father   . Peripheral vascular disease Father        amputation  . Cancer Father        Lead Poison-Ca  . Pneumonia Father   . Diabetes Sister   . Hypertension Sister   . Diabetes Daughter   . Hypertension Daughter     Past Surgical History:  Procedure Laterality Date  . COLON SURGERY     Social History   Occupational History  . Occupation: retired  Tobacco Use  . Smoking status: Former Smoker    Packs/day: 1.50    Years: 50.00    Pack years: 75.00    Types: Cigarettes    Quit date: 11/04/2010    Years since quitting: 9.3  . Smokeless tobacco: Never Used  Vaping Use  . Vaping Use: Never used  Substance and Sexual Activity  . Alcohol use: No    Alcohol/week: 0.0 standard drinks  . Drug use: No  . Sexual activity: Not on file

## 2020-03-04 ENCOUNTER — Ambulatory Visit: Payer: HMO | Admitting: Physician Assistant

## 2020-03-06 ENCOUNTER — Ambulatory Visit: Payer: Self-pay

## 2020-03-07 ENCOUNTER — Other Ambulatory Visit: Payer: Self-pay

## 2020-03-07 NOTE — Patient Outreach (Signed)
  [facility]) Care Management Chronic Special Needs Program    03/07/2020  Name: ULYSEE FYOCK, DOB: 1941-03-03  MRN: [id number]   Mr. Obie Kallenbach is enrolled in a chronic special needs plan for Diabetes. Telephone call to client's daughter [name]. Unable to reach. HIPAA compliant voice message left with call back phone number and return call request.  PLAN; RNCM will attempt 3rd telephone outreach with daughter in 1 week.   George Ina RN,BSN,CCM Chronic Care Management Coordinator [facility] Care Management [phone]

## 2020-03-12 ENCOUNTER — Ambulatory Visit (INDEPENDENT_AMBULATORY_CARE_PROVIDER_SITE_OTHER): Payer: HMO | Admitting: Physician Assistant

## 2020-03-12 ENCOUNTER — Other Ambulatory Visit: Payer: Self-pay

## 2020-03-12 ENCOUNTER — Encounter: Payer: Self-pay | Admitting: Physician Assistant

## 2020-03-12 DIAGNOSIS — I739 Peripheral vascular disease, unspecified: Secondary | ICD-10-CM

## 2020-03-12 NOTE — Patient Outreach (Signed)
[facility]) Care Management Chronic Special Needs Program  03/12/2020  Name: Ivan Parks DOB: 09-07-1940  MRN: [id number]  Mr. Montoya Brandel is enrolled in a chronic special needs plan for Diabetes.  Telephone call to client's daughter, [name]  For completion of CSNP assessment follow up. Unable to reach. HIPAA compliant voice message left with call back phone number.  Individualized care plan completed based on available data.     Goals Addressed              This Visit's Progress   .  Caregiver will report client's toe has healed wthin 3 months.   On track     Unable to reach client's daughter, [name] to discuss Continue below recommendations: Contact your doctor if you have signs/ or symptoms of infection:  fever, redness, increased swelling, drainage, odor,  or increased pain.  Contact your doctor for questions Follow up with your doctor as recommended.     .  COMPLETED: caregiver will report scheduling endocrinologist appointment in 3 months.   On track     Per chart review endocrinology appointments completed  01/15/20    .  COMPLETED: Client understands the importance of follow-up with providers by attending scheduled visits   On track     Most recent primary care provider office. 12/13/19 and 05/07/20 Endocrinologist visit.10/28/18 and 01/15/20,        .  Client verbalize knowledge of Heart Failure disease self management skills in 6 months   On track     Review [employer] calendar sent in the mail for Heart failure information Contact your doctor for mild to moderate heart failure symptoms.  Call 911 for severe symptoms.  RN case manager will send client education article: Heart failure Ed    .  Client will report wearing leg compression wraps in 3 months   On track     Wear leg compression wraps as recommended by your doctor.     .  Client will verbalize knowledge of self management of Hypertension as evidences by BP  reading of 140/90 or less; or as defined by provider   On track     Continue to attend your follow up appointments with your doctor.  Continue to take your medication as prescribed. Review your [employer] calendar sent in the mail for Hypertension information Call your doctor if you have concerns regarding your blood pressure.     .  Client's wife and/ or daughter will report client completed eye exam within 3 months. (pt-stated)   Not on track     Unable to reach clients daughter to discuss.  Your last documented eye exam was 07/16/2016 Plan to schedule an annual diabetic eye exam      .  Client/Caregiver will verbalize understanding of instructions related to self-care and safety   On track     Continue to use cane/ walker as advised by your doctor Review fall prevention guidelines as stated below:  . Inside your home: Don't use throw rugs, use plenty of lighting, keep walkways clear of clutter, and remove tripping hazards such as cords. Be aware of small pets when walking. . For outside safety:  Slow down and look ahead, avoid problem areas avoid uneven or slippery pavement, watch out for slippery flooring, especially polished surfaces, wear proper non-rubber soled shoes, rather than flip flops or high heels and carry a cell phone and/ or medical alert device with you.  RN case  manager will send client education article: Preventing falls ED            .  HEMOGLOBIN A1C < 8        Your most recent Hgb A1c was 10.9 on 01/15/20. Continue to follow items stated below:  It is important to check you blood sugars daily before eating with a goal of 80-130.  You can also check 1 1/2 hours after eating with a goal of 180 or less.  Plan to eat low carboydrates and low salt meals, watch portion sizes and avoid sugar sweetended drinks.   Please take diabetic medication if recommended by your doctor.  Review [employer] calendar sent in the mail for Diabetes Action  plan Exercises only if you are able to and / or recommended by your doctor.  Continue to follow up with your endocrinologist as recommended.       .  Maintain timely refills of diabetic medication as prescribed within the year .   On track     Wife reports client is able to obtain his refills of diabetic medication.  Wife reports she fills client's pill box weekly and makes sure he is taking his medications.  Contact your RN case manager [phone]) if you have difficulty obtaining your medications Call your doctor if you have questions concerning your medications.    .  Obtain annual  Lipid Profile, LDL-C   On track     Lipid panel was completed on 03/08/19 Continue to follow up with your doctor regularly and have lab work completed as recommended.  RN case manager will send client education article: Lipid test.        .  Obtain Annual Eye (retinal)  Exam    Not on track     Your last documented eye exam was 07/16/2016 Plan to schedule an annual diabetic eye exam.       .  Obtain annual screen for micro albuminuria (urine) , nephropathy (kidney problems)   Not on track     Per chart review, no results found for micro Albuminuria Discuss having a micro albuminuria screening with your doctor at your next appointment RN case manager will send client education article: Urine Albumin test      .  Obtain Hemoglobin A1C at least 2 times per year   On track     Hgb A1c completed  on 01/15/20 and 11/16/18 Follow up with your provider regularly and have lab work completed as recommended.       .  COMPLETED: Visit Primary Care Provider or Endocrinologist at least 2 times per year    On track     Most recent primary care provider office. 12/13/19 and 05/07/20 Endocrinologist visit.10/28/18 and 01/15/20,        ASSESSMENT: Partial information provided by client's wife on 02/23/20 toward this assessment follow up.  Wife requested client's daughter, [name] be contacted to complete  assessment. Unable to reach daughter after 3 attempts.  Assessment completed based on available data.   Plan:  Send successful outreach letter with a copy of their individualized care plan and Send individual care plan to provider and education articles  Chronic care management coordinator will outreach in:  6 Months     George Ina RN,BSN,CCM Chronic Care Management Coordinator [facility] Care Management [phone]    .

## 2020-03-12 NOTE — Progress Notes (Signed)
Office Visit Note   Patient: Ivan Parks           Date of Birth: 10-Nov-1940           MRN: [id number] Visit Date: 03/12/2020              Requested by: Laurann Montana, MD [phone] [address] [place],  [place] [address] PCP: Laurann Montana, MD  Chief Complaint  Patient presents with  . Left Leg - Follow-up  . Right Leg - Follow-up      HPI: Patient presents today for follow-up on his bilateral lower extremity venous stasis. On the left side he has been utilizing a CircAid wrap. On the right side we did have him in a Profore wrap which he has removed and placed his circaid  was a wrap He feels he is doing much better Assessment & Plan: Visit Diagnoses: No diagnosis found.  Plan: Continue with CircAid wraps should follow-up in 1 month  Follow-Up Instructions: No follow-ups on file.   Ortho Exam  Patient is alert, oriented, no adenopathy, well-dressed, normal affect, normal respiratory effort. Right lower extremity is definitely softer than his last visit he does have good compressibility still some moderate swelling but no cellulitis. On the left his leg looks excellent with significant decrease in swelling no cellulitis. No ulcers or skin breakdown on either leg  Imaging: No results found. No images are attached to the encounter.  Labs: Lab Results  Component Value Date   HGBA1C 10.9 (A) 01/15/2020   HGBA1C 8.1 (H) 11/16/2018   HGBA1C 8.3 (A) 10/28/2018   ESRSEDRATE 4 11/09/2010   REPTSTATUS 11/20/2018 FINAL 11/15/2018   GRAMSTAIN  11/04/2010    MODERATE WBC PRESENT, PREDOMINANTLY PMN NO SQUAMOUS EPITHELIAL CELLS SEEN FEW GRAM NEGATIVE RODS   CULT  11/15/2018    NO GROWTH 5 DAYS Performed at [facility], [address] [place], [place] [address]      Lab Results  Component Value Date   ALBUMIN 3.3 (L) 04/07/2019   ALBUMIN 3.1 (L) 12/14/2018   ALBUMIN 2.8 (L) 11/19/2018    Lab Results  Component Value Date   MG 1.8 11/21/2018   MG  1.7 11/20/2018   MG 2.1 11/04/2010   No results found for: VD25OH  No results found for: PREALBUMIN CBC EXTENDED Latest Ref Rng & Units 04/07/2019 04/07/2019 12/14/2018  WBC 4.0 - 10.5 K/uL - 8.2 -  RBC 4.22 - 5.81 MIL/uL - 4.74 -  HGB 13.0 - 17.0 g/dL 41.9 62.2 29.7  HCT 39 - 52 % 42.0 43.3 45.0  PLT 150 - 400 K/uL - 187 -  NEUTROABS 1.7 - 7.7 K/uL - - -  LYMPHSABS 0.7 - 4.0 K/uL - - -     There is no height or weight on file to calculate BMI.  Orders:  No orders of the defined types were placed in this encounter.  No orders of the defined types were placed in this encounter.    Procedures: No procedures performed  Clinical Data: No additional findings.  ROS:  All other systems negative, except as noted in the HPI. Review of Systems  Objective: Vital Signs: There were no vitals taken for this visit.  Specialty Comments:  No specialty comments available.  PMFS History: Patient Active Problem List   Diagnosis Date Noted  . Infection 11/15/2018  . 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 Chest pain 12/02/2017  . Peripheral vascular disease (HCC)   .  Hyperlipidemia   . CHF (congestive heart failure) (HCC)   . Diabetes (HCC) 05/02/2016  . AKI (acute kidney injury) (HCC) 04/01/2016  . Hypotension 03/31/2016  . Essential hypertension 03/06/2015  . Chronic respiratory failure (HCC) 11/26/2014  . Leg edema, right- Greater than Left leg 07/05/2014  . Allergic rhinitis 11/09/2012  . 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 Acute respiratory failure (HCC)   . AKI (acute kidney injury) (HCC) 04/01/2016  . Bilateral lower extremity edema   . Bronchitis   . Chest pain 12/02/2017  . CHF (congestive heart failure) (HCC)   . Chronic respiratory failure (HCC) 11/26/2014  . COPD (chronic obstructive pulmonary disease) (HCC)   . 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 Dementia (HCC)   . Diabetes (HCC) 05/02/2016  . DM (diabetes mellitus) (HCC)   . Essential hypertension 03/06/2015   hypertension   . HTN (hypertension)   . Hyperlipidemia   . Hypotension 03/31/2016  . Hypoxemia   . Leg edema, right- Greater than Left leg 07/05/2014   Lower extremity edema   . OSA (obstructive sleep apnea)   . Peripheral vascular disease (HCC)   . Tobacco abuse     Family History  Problem Relation Age of Onset  . Diabetes Mother   . Hypertension Mother   . Peripheral vascular disease Mother        amputation  . Diabetes Father        Right Leg Amputation-Gangrene  . Hypertension Father   . Peripheral vascular disease Father        amputation  . Cancer Father        Lead Poison-Ca  . Pneumonia Father   . Diabetes Sister   . Hypertension Sister   . Diabetes Daughter   . Hypertension Daughter     Past Surgical History:  Procedure Laterality Date  . COLON SURGERY     Social History   Occupational History  . Occupation: retired  Tobacco Use  . Smoking status: Former Smoker    Packs/day: 1.50    Years: 50.00    Pack years: 75.00    Types: Cigarettes    Quit date: 11/04/2010    Years since quitting: 9.3  . Smokeless tobacco: Never Used  Vaping Use  . Vaping Use: Never used  Substance and Sexual Activity  . Alcohol use: No    Alcohol/week: 0.0 standard drinks  . Drug use: No  . Sexual activity: Not on file

## 2020-04-02 ENCOUNTER — Encounter: Payer: Self-pay | Admitting: Physician Assistant

## 2020-04-02 ENCOUNTER — Ambulatory Visit (INDEPENDENT_AMBULATORY_CARE_PROVIDER_SITE_OTHER): Payer: HMO | Admitting: Physician Assistant

## 2020-04-02 DIAGNOSIS — I872 Venous insufficiency (chronic) (peripheral): Secondary | ICD-10-CM | POA: Diagnosis not present

## 2020-04-02 NOTE — Progress Notes (Signed)
Office Visit Note   Patient: Ivan Parks           Date of Birth: 03-28-1941           MRN: [id number] Visit Date: 04/02/2020              Requested by: Laurann Montana, MD [phone] [place],  [place] [address] PCP: Laurann Montana, MD  Chief Complaint  Patient presents with  . Right Leg - Edema  . Left Leg - Edema      HPI: This is a pleasant gentleman with bilateral lower extremity venous insufficiency.  We had been Profore wrapping his bilateral lower extremities and then he was transitioned to a CircAid wrap on the left.  His family member that accompanies him is concerned because he keeps taking the wraps off.  He has reaccumulated swelling especially on the right side  Assessment & Plan: Visit Diagnoses: No diagnosis found.  Plan: Emphasized the importance of compliance to the patient.  We will place him in a Profore wrap on the right and put his CircAid wrap on the left.  Should follow-up in 1 week.  Follow-Up Instructions: No follow-ups on file.   Ortho Exam  Patient is alert, oriented, no adenopathy, well-dressed, normal affect, normal respiratory effort. Bilateral lower extremities bilateral venous insufficiency and dermatitis.  There is no cellulitis.  No open skin ulcers.  Right is significantly more swollen than the left.  No tenderness to palpation  Imaging: No results found. No images are attached to the encounter.  Labs: Lab Results  Component Value Date   HGBA1C 10.9 (A) 01/15/2020   HGBA1C 8.1 (H) 11/16/2018   HGBA1C 8.3 (A) 10/28/2018   ESRSEDRATE 4 11/09/2010   REPTSTATUS 11/20/2018 FINAL 11/15/2018   GRAMSTAIN  11/04/2010    MODERATE WBC PRESENT, PREDOMINANTLY PMN NO SQUAMOUS EPITHELIAL CELLS SEEN FEW GRAM NEGATIVE RODS   CULT  11/15/2018    NO GROWTH 5 DAYS Performed at [facility], [address], [place], [place] [address]      Lab Results  Component Value Date   ALBUMIN 3.3 (L) 04/07/2019   ALBUMIN 3.1 (L)  12/14/2018   ALBUMIN 2.8 (L) 11/19/2018    Lab Results  Component Value Date   MG 1.8 11/21/2018   MG 1.7 11/20/2018   MG 2.1 11/04/2010   No results found for: VD25OH  No results found for: PREALBUMIN CBC EXTENDED Latest Ref Rng & Units 04/07/2019 04/07/2019 12/14/2018  WBC 4.0 - 10.5 K/uL - 8.2 -  RBC 4.22 - 5.81 MIL/uL - 4.74 -  HGB 13.0 - 17.0 g/dL 72.5 36.6 44.0  HCT 39 - 52 % 42.0 43.3 45.0  PLT 150 - 400 K/uL - 187 -  NEUTROABS 1.7 - 7.7 K/uL - - -  LYMPHSABS 0.7 - 4.0 K/uL - - -     There is no height or weight on file to calculate BMI.  Orders:  No orders of the defined types were placed in this encounter.  No orders of the defined types were placed in this encounter.    Procedures: No procedures performed  Clinical Data: No additional findings.  ROS:  All other systems negative, except as noted in the HPI. Review of Systems  Objective: Vital Signs: There were no vitals taken for this visit.  Specialty Comments:  No specialty comments available.  PMFS History: Patient Active Problem List   Diagnosis Date Noted  . Infection 11/15/2018  .  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 Chest pain 12/02/2017  . Peripheral vascular disease (HCC)   . Hyperlipidemia   . CHF (congestive heart failure) (HCC)   . Diabetes (HCC) 05/02/2016  . AKI (acute kidney injury) (HCC) 04/01/2016  . Hypotension 03/31/2016  . Essential hypertension 03/06/2015  . Chronic respiratory failure (HCC) 11/26/2014  . Leg edema, right- Greater than Left leg 07/05/2014  . Allergic rhinitis 11/09/2012  . 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 Acute respiratory failure (HCC)   . AKI (acute kidney injury) (HCC) 04/01/2016  . Bilateral lower extremity edema   . Bronchitis   . Chest pain 12/02/2017  . CHF (congestive heart failure) (HCC)   . Chronic respiratory failure (HCC) 11/26/2014  . COPD (chronic obstructive pulmonary disease) (HCC)   .  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 Dementia (HCC)   . Diabetes (HCC) 05/02/2016  . DM (diabetes mellitus) (HCC)   . Essential hypertension 03/06/2015   hypertension   . HTN (hypertension)   . Hyperlipidemia   . Hypotension 03/31/2016  . Hypoxemia   . Leg edema, right- Greater than Left leg 07/05/2014   Lower extremity edema   . OSA (obstructive sleep apnea)   . Peripheral vascular disease (HCC)   . Tobacco abuse     Family History  Problem Relation Age of Onset  . Diabetes Mother   . Hypertension Mother   . Peripheral vascular disease Mother        amputation  . Diabetes Father        Right Leg Amputation-Gangrene  . Hypertension Father   . Peripheral vascular disease Father        amputation  . Cancer Father        Lead Poison-Ca  . Pneumonia Father   . Diabetes Sister   . Hypertension Sister   . Diabetes Daughter   . Hypertension Daughter     Past Surgical History:  Procedure Laterality Date  . COLON SURGERY     Social History   Occupational History  . Occupation: retired  Tobacco Use  . Smoking status: Former Smoker    Packs/day: 1.50    Years: 50.00    Pack years: 75.00    Types: Cigarettes    Quit date: 11/04/2010    Years since quitting: 9.4  . Smokeless tobacco: Never Used  Vaping Use  . Vaping Use: Never used  Substance and Sexual Activity  . Alcohol use: No    Alcohol/week: 0.0 standard drinks  . Drug use: No  . Sexual activity: Not on file

## 2020-04-09 ENCOUNTER — Ambulatory Visit: Payer: HMO | Admitting: Physician Assistant

## 2020-04-22 DIAGNOSIS — E1165 Type 2 diabetes mellitus with hyperglycemia: Secondary | ICD-10-CM | POA: Diagnosis not present

## 2020-04-22 DIAGNOSIS — E1151 Type 2 diabetes mellitus with diabetic peripheral angiopathy without gangrene: Secondary | ICD-10-CM | POA: Diagnosis not present

## 2020-04-22 DIAGNOSIS — J449 Chronic obstructive pulmonary disease, unspecified: Secondary | ICD-10-CM | POA: Diagnosis not present

## 2020-04-22 DIAGNOSIS — E785 Hyperlipidemia, unspecified: Secondary | ICD-10-CM | POA: Diagnosis not present

## 2020-04-22 DIAGNOSIS — K219 Gastro-esophageal reflux disease without esophagitis: Secondary | ICD-10-CM | POA: Diagnosis not present

## 2020-04-22 DIAGNOSIS — I10 Essential (primary) hypertension: Secondary | ICD-10-CM | POA: Diagnosis not present

## 2020-04-22 DIAGNOSIS — J441 Chronic obstructive pulmonary disease with (acute) exacerbation: Secondary | ICD-10-CM | POA: Diagnosis not present

## 2020-04-23 DIAGNOSIS — E785 Hyperlipidemia, unspecified: Secondary | ICD-10-CM | POA: Diagnosis not present

## 2020-04-23 DIAGNOSIS — E1151 Type 2 diabetes mellitus with diabetic peripheral angiopathy without gangrene: Secondary | ICD-10-CM | POA: Diagnosis not present

## 2020-04-23 DIAGNOSIS — R413 Other amnesia: Secondary | ICD-10-CM | POA: Diagnosis not present

## 2020-04-23 DIAGNOSIS — I10 Essential (primary) hypertension: Secondary | ICD-10-CM | POA: Diagnosis not present

## 2020-04-23 DIAGNOSIS — I872 Venous insufficiency (chronic) (peripheral): Secondary | ICD-10-CM | POA: Diagnosis not present

## 2020-05-03 ENCOUNTER — Other Ambulatory Visit: Payer: Self-pay

## 2020-05-03 NOTE — Patient Outreach (Signed)
  [facility]) Care Management Chronic Special Needs Program    05/03/2020  Name: Ivan Parks, DOB: [birth year]  MRN: [id number]   Mr. [name] is enrolled in a chronic special needs plan for Diabetes. [facility] Care Management will continue to provide services for this member through 05/17/2020. The [employer] Care Management Team will assume care 05/18/2020.   Kathyrn Sheriff, RN, MSN, [facility] Chronic Care Management Coordinator [facility] [phone]

## 2020-05-20 ENCOUNTER — Ambulatory Visit: Payer: HMO | Admitting: Physician Assistant

## 2020-05-21 ENCOUNTER — Other Ambulatory Visit: Payer: Self-pay

## 2020-06-17 DIAGNOSIS — J449 Chronic obstructive pulmonary disease, unspecified: Secondary | ICD-10-CM | POA: Diagnosis not present

## 2020-06-17 DIAGNOSIS — E1151 Type 2 diabetes mellitus with diabetic peripheral angiopathy without gangrene: Secondary | ICD-10-CM | POA: Diagnosis not present

## 2020-06-17 DIAGNOSIS — K219 Gastro-esophageal reflux disease without esophagitis: Secondary | ICD-10-CM | POA: Diagnosis not present

## 2020-06-17 DIAGNOSIS — E1165 Type 2 diabetes mellitus with hyperglycemia: Secondary | ICD-10-CM | POA: Diagnosis not present

## 2020-06-17 DIAGNOSIS — I10 Essential (primary) hypertension: Secondary | ICD-10-CM | POA: Diagnosis not present

## 2020-06-17 DIAGNOSIS — J441 Chronic obstructive pulmonary disease with (acute) exacerbation: Secondary | ICD-10-CM | POA: Diagnosis not present

## 2020-06-17 DIAGNOSIS — E785 Hyperlipidemia, unspecified: Secondary | ICD-10-CM | POA: Diagnosis not present

## 2020-06-20 ENCOUNTER — Ambulatory Visit (INDEPENDENT_AMBULATORY_CARE_PROVIDER_SITE_OTHER): Payer: HMO | Admitting: Physician Assistant

## 2020-06-20 ENCOUNTER — Encounter: Payer: Self-pay | Admitting: Orthopedic Surgery

## 2020-06-20 DIAGNOSIS — I872 Venous insufficiency (chronic) (peripheral): Secondary | ICD-10-CM | POA: Diagnosis not present

## 2020-06-20 NOTE — Progress Notes (Signed)
Office Visit Note   Patient: Ivan Parks           Date of Birth: July 12, 1940           MRN: [id number] Visit Date: 06/20/2020              Requested by: Laurann Montana, MD [phone] [address] [place],  [place] [address] PCP: Laurann Montana, MD  Chief Complaint  Patient presents with  . Right Leg - Follow-up  . Left Leg - Follow-up      HPI: Patient is here to follow-up on his bilateral lower extremity venous stasis disease.  He has swelling in the right greater than left.  He is wearing his vive compression stockings  Assessment & Plan: Visit Diagnoses: No diagnosis found.  Plan: Spoke with him about continuing to wear the socks around the clock.  Should change socks daily.  Also good to do some ambulating with his cane as he can and then elevate his legs.  Follow-up in 3 weeks.  I think at some point he may actually need a smaller size compression sock  Follow-Up Instructions: No follow-ups on file.   Ortho Exam  Patient is alert, oriented, no adenopathy, well-dressed, normal affect, normal respiratory effort. Focused examination demonstrates lower extremity venous stasis right greater than left though under good control.  There is no open ulcers there is some wrinkling of the skin.  Swelling is well controlled no necrotic areas.  No ascending cellulitis or erythema  Imaging: No results found. No images are attached to the encounter.  Labs: Lab Results  Component Value Date   HGBA1C 10.9 (A) 01/15/2020   HGBA1C 8.1 (H) 11/16/2018   HGBA1C 8.3 (A) 10/28/2018   ESRSEDRATE 4 11/09/2010   REPTSTATUS 11/20/2018 FINAL 11/15/2018   GRAMSTAIN  11/04/2010    MODERATE WBC PRESENT, PREDOMINANTLY PMN NO SQUAMOUS EPITHELIAL CELLS SEEN FEW GRAM NEGATIVE RODS   CULT  11/15/2018    NO GROWTH 5 DAYS Performed at [facility], [address] [place], [place] [address]      Lab Results  Component Value Date   ALBUMIN 3.3 (L) 04/07/2019   ALBUMIN 3.1 (L)  12/14/2018   ALBUMIN 2.8 (L) 11/19/2018    Lab Results  Component Value Date   MG 1.8 11/21/2018   MG 1.7 11/20/2018   MG 2.1 11/04/2010   No results found for: VD25OH  No results found for: PREALBUMIN CBC EXTENDED Latest Ref Rng & Units 04/07/2019 04/07/2019 12/14/2018  WBC 4.0 - 10.5 K/uL - 8.2 -  RBC 4.22 - 5.81 MIL/uL - 4.74 -  HGB 13.0 - 17.0 g/dL 62.1 30.8 65.7  HCT 84.6 - 52.0 % 42.0 43.3 45.0  PLT 150 - 400 K/uL - 187 -  NEUTROABS 1.7 - 7.7 K/uL - - -  LYMPHSABS 0.7 - 4.0 K/uL - - -     There is no height or weight on file to calculate BMI.  Orders:  No orders of the defined types were placed in this encounter.  No orders of the defined types were placed in this encounter.    Procedures: No procedures performed  Clinical Data: No additional findings.  ROS:  All other systems negative, except as noted in the HPI. Review of Systems  Objective: Vital Signs: There were no vitals taken for this visit.  Specialty Comments:  No specialty comments available.  PMFS History: Patient Active Problem List   Diagnosis Date Noted  . Infection  11/15/2018  . 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 Chest pain 12/02/2017  . Peripheral vascular disease (HCC)   . Hyperlipidemia   . CHF (congestive heart failure) (HCC)   . Diabetes (HCC) 05/02/2016  . AKI (acute kidney injury) (HCC) 04/01/2016  . Hypotension 03/31/2016  . Essential hypertension 03/06/2015  . Chronic respiratory failure (HCC) 11/26/2014  . Leg edema, right- Greater than Left leg 07/05/2014  . Allergic rhinitis 11/09/2012  . 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 Acute respiratory failure (HCC)   . AKI (acute kidney injury) (HCC) 04/01/2016  . Bilateral lower extremity edema   . Bronchitis   . Chest pain 12/02/2017  . CHF (congestive heart failure) (HCC)   . Chronic respiratory failure (HCC) 11/26/2014  . COPD (chronic obstructive pulmonary disease) (HCC)    . 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 Dementia (HCC)   . Diabetes (HCC) 05/02/2016  . DM (diabetes mellitus) (HCC)   . Essential hypertension 03/06/2015   hypertension   . HTN (hypertension)   . Hyperlipidemia   . Hypotension 03/31/2016  . Hypoxemia   . Leg edema, right- Greater than Left leg 07/05/2014   Lower extremity edema   . OSA (obstructive sleep apnea)   . Peripheral vascular disease (HCC)   . Tobacco abuse     Family History  Problem Relation Age of Onset  . Diabetes Mother   . Hypertension Mother   . Peripheral vascular disease Mother        amputation  . Diabetes Father        Right Leg Amputation-Gangrene  . Hypertension Father   . Peripheral vascular disease Father        amputation  . Cancer Father        Lead Poison-Ca  . Pneumonia Father   . Diabetes Sister   . Hypertension Sister   . Diabetes Daughter   . Hypertension Daughter     Past Surgical History:  Procedure Laterality Date  . COLON SURGERY     Social History   Occupational History  . Occupation: retired  Tobacco Use  . Smoking status: Former Smoker    Packs/day: 1.50    Years: 50.00    Pack years: 75.00    Types: Cigarettes    Quit date: 11/04/2010    Years since quitting: 9.6  . Smokeless tobacco: Never Used  Vaping Use  . Vaping Use: Never used  Substance and Sexual Activity  . Alcohol use: No    Alcohol/week: 0.0 standard drinks  . Drug use: No  . Sexual activity: Not on file

## 2020-06-27 ENCOUNTER — Encounter (HOSPITAL_COMMUNITY): Payer: Self-pay | Admitting: Emergency Medicine

## 2020-06-27 ENCOUNTER — Other Ambulatory Visit: Payer: Self-pay

## 2020-06-27 DIAGNOSIS — R1031 Right lower quadrant pain: Secondary | ICD-10-CM | POA: Diagnosis not present

## 2020-06-27 DIAGNOSIS — R5381 Other malaise: Secondary | ICD-10-CM | POA: Diagnosis not present

## 2020-06-27 DIAGNOSIS — Z794 Long term (current) use of insulin: Secondary | ICD-10-CM | POA: Diagnosis not present

## 2020-06-27 DIAGNOSIS — Z79899 Other long term (current) drug therapy: Secondary | ICD-10-CM | POA: Insufficient documentation

## 2020-06-27 DIAGNOSIS — I509 Heart failure, unspecified: Secondary | ICD-10-CM | POA: Diagnosis not present

## 2020-06-27 DIAGNOSIS — Z87891 Personal history of nicotine dependence: Secondary | ICD-10-CM | POA: Insufficient documentation

## 2020-06-27 DIAGNOSIS — F0390 Unspecified dementia without behavioral disturbance: Secondary | ICD-10-CM | POA: Diagnosis not present

## 2020-06-27 DIAGNOSIS — E119 Type 2 diabetes mellitus without complications: Secondary | ICD-10-CM | POA: Diagnosis not present

## 2020-06-27 DIAGNOSIS — E278 Other specified disorders of adrenal gland: Secondary | ICD-10-CM | POA: Diagnosis not present

## 2020-06-27 DIAGNOSIS — K4090 Unilateral inguinal hernia, without obstruction or gangrene, not specified as recurrent: Secondary | ICD-10-CM | POA: Diagnosis not present

## 2020-06-27 DIAGNOSIS — N433 Hydrocele, unspecified: Secondary | ICD-10-CM | POA: Diagnosis not present

## 2020-06-27 DIAGNOSIS — J449 Chronic obstructive pulmonary disease, unspecified: Secondary | ICD-10-CM | POA: Insufficient documentation

## 2020-06-27 DIAGNOSIS — I110 Hypertensive heart disease with heart failure: Secondary | ICD-10-CM | POA: Diagnosis not present

## 2020-06-27 DIAGNOSIS — Z743 Need for continuous supervision: Secondary | ICD-10-CM | POA: Diagnosis not present

## 2020-06-27 DIAGNOSIS — R109 Unspecified abdominal pain: Secondary | ICD-10-CM | POA: Diagnosis not present

## 2020-06-27 DIAGNOSIS — I878 Other specified disorders of veins: Secondary | ICD-10-CM | POA: Diagnosis not present

## 2020-06-27 DIAGNOSIS — R0902 Hypoxemia: Secondary | ICD-10-CM | POA: Diagnosis not present

## 2020-06-27 NOTE — ED Triage Notes (Addendum)
Pt BIB [facility] from home. He had a BM and then started experiencing R groin pain. Started 1-2 hours ago. VSS. Hx dementia, A&Ox3. Reports that he did have a fall out of his chair this morning, but was able to get up function throughout the day.

## 2020-06-28 ENCOUNTER — Emergency Department (HOSPITAL_COMMUNITY)
Admission: EM | Admit: 2020-06-28 | Discharge: 2020-06-28 | Disposition: A | Discharge status: Home / Self Care | Payer: HMO | Attending: Emergency Medicine | Admitting: Emergency Medicine

## 2020-06-28 ENCOUNTER — Emergency Department (HOSPITAL_COMMUNITY): Payer: HMO

## 2020-06-28 DIAGNOSIS — R1031 Right lower quadrant pain: Secondary | ICD-10-CM | POA: Diagnosis not present

## 2020-06-28 DIAGNOSIS — N433 Hydrocele, unspecified: Secondary | ICD-10-CM | POA: Diagnosis not present

## 2020-06-28 DIAGNOSIS — I878 Other specified disorders of veins: Secondary | ICD-10-CM | POA: Diagnosis not present

## 2020-06-28 DIAGNOSIS — E278 Other specified disorders of adrenal gland: Secondary | ICD-10-CM | POA: Diagnosis not present

## 2020-06-28 DIAGNOSIS — K4090 Unilateral inguinal hernia, without obstruction or gangrene, not specified as recurrent: Secondary | ICD-10-CM | POA: Diagnosis not present

## 2020-06-28 LAB — CBC WITH DIFFERENTIAL/PLATELET
Abs Immature Granulocytes: 0.02 K/uL (ref 0.00–0.07)
Basophils Absolute: 0 K/uL (ref 0.0–0.1)
Basophils Relative: 0 %
Eosinophils Absolute: 0.3 K/uL (ref 0.0–0.5)
Eosinophils Relative: 3 %
HCT: 43.8 % (ref 39.0–52.0)
Hemoglobin: 12.8 g/dL — ABNORMAL LOW (ref 13.0–17.0)
Immature Granulocytes: 0 %
Lymphocytes Relative: 17 %
Lymphs Abs: 1.3 K/uL (ref 0.7–4.0)
MCH: 27.6 pg (ref 26.0–34.0)
MCHC: 29.2 g/dL — ABNORMAL LOW (ref 30.0–36.0)
MCV: 94.4 fL (ref 80.0–100.0)
Monocytes Absolute: 0.8 K/uL (ref 0.1–1.0)
Monocytes Relative: 11 %
Neutro Abs: 5.2 K/uL (ref 1.7–7.7)
Neutrophils Relative %: 69 %
Platelets: 159 K/uL (ref 150–400)
RBC: 4.64 MIL/uL (ref 4.22–5.81)
RDW: 12.7 % (ref 11.5–15.5)
WBC: 7.7 K/uL (ref 4.0–10.5)
nRBC: 0 % (ref 0.0–0.2)

## 2020-06-28 LAB — URINALYSIS, ROUTINE W REFLEX MICROSCOPIC
Bacteria, UA: NONE SEEN
Bilirubin Urine: NEGATIVE
Glucose, UA: NEGATIVE mg/dL
Hgb urine dipstick: NEGATIVE
Ketones, ur: NEGATIVE mg/dL
Leukocytes,Ua: NEGATIVE
Nitrite: NEGATIVE
Protein, ur: 100 mg/dL — AB
Specific Gravity, Urine: 1.016 (ref 1.005–1.030)
pH: 7 (ref 5.0–8.0)

## 2020-06-28 LAB — BASIC METABOLIC PANEL
Anion gap: 8 (ref 5–15)
BUN: 21 mg/dL (ref 8–23)
CO2: 31 mmol/L (ref 22–32)
Calcium: 8.5 mg/dL — ABNORMAL LOW (ref 8.9–10.3)
Chloride: 101 mmol/L (ref 98–111)
Creatinine, Ser: 1.3 mg/dL — ABNORMAL HIGH (ref 0.61–1.24)
GFR, Estimated: 56 mL/min — ABNORMAL LOW (ref >60)
Glucose, Bld: 137 mg/dL — ABNORMAL HIGH (ref 70–99)
Potassium: 3.8 mmol/L (ref 3.5–5.1)
Sodium: 140 mmol/L (ref 135–145)

## 2020-06-28 NOTE — ED Notes (Signed)
Wife called to pick up patient. Wife stated they will be here in an hour.

## 2020-06-28 NOTE — ED Provider Notes (Signed)
[facility]Y DEPT Provider Note: Ivan Dell, MD, FACEP  CSN: [id number] MRN: [id number] ARRIVAL: 06/27/20 at 2330 ROOM: WA01/WA01   CHIEF COMPLAINT  Groin Pain  HISTORY OF PRESENT ILLNESS  06/28/20 1:00 AM Ivan Parks is a 80 y.o. male who started experiencing severe right groin pain about 1 to 2 hours prior to arrival.  It began after having a bowel movement.  He rates the pain as a 7 out of 10, aching in nature.  It is worse with palpation or movement.  He also states he has been urinating more frequently than usual since yesterday morning.    Past Medical History:  Diagnosis Date  . Acute respiratory failure (HCC)   . AKI (acute kidney injury) (HCC) 04/01/2016  . Bilateral lower extremity edema   . Bronchitis   . Chest pain 12/02/2017  . CHF (congestive heart failure) (HCC)   . Chronic respiratory failure (HCC) 11/26/2014  . COPD (chronic obstructive pulmonary disease) (HCC)   . 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 Dementia (HCC)   . Diabetes (HCC) 05/02/2016  . DM (diabetes mellitus) (HCC)   . Essential hypertension 03/06/2015   hypertension   . HTN (hypertension)   . Hyperlipidemia   . Hypotension 03/31/2016  . Hypoxemia   . Leg edema, right- Greater than Left leg 07/05/2014   Lower extremity edema   . OSA (obstructive sleep apnea)   . Peripheral vascular disease (HCC)   . Tobacco abuse     Past Surgical History:  Procedure Laterality Date  . COLON SURGERY      Family History  Problem Relation Age of Onset  . Diabetes Mother   . Hypertension Mother   . Peripheral vascular disease Mother        amputation  . Diabetes Father        Right Leg Amputation-Gangrene  . Hypertension Father   . Peripheral vascular disease Father        amputation  . Cancer Father        Lead Poison-Ca  . Pneumonia Father   .  Diabetes Sister   . Hypertension Sister   . Diabetes Daughter   . Hypertension Daughter     Social History   Tobacco Use  . Smoking status: Former Smoker    Packs/day: 1.50    Years: 50.00    Pack years: 75.00    Types: Cigarettes    Quit date: 11/04/2010    Years since quitting: 9.6  . Smokeless tobacco: Never Used  Vaping Use  . Vaping Use: Never used  Substance Use Topics  . Alcohol use: No    Alcohol/week: 0.0 standard drinks  . Drug use: No    Prior to Admission medications   Medication Sig Start Date End Date Taking? Authorizing Provider  acetaminophen (TYLENOL) 325 MG tablet Take 2 tablets (650 mg total) by mouth every 6 (six) hours as needed for mild pain (or Fever >/= 101). 11/22/18   Buriev, Isaiah Serge, MD  albuterol (PROVENTIL) (2.5 MG/3ML) 0.083% nebulizer solution TAKE 3 MLS (2.5 MG TOTAL) BY NEBULIZATION EVERY 6 (SIX) HOURS AS NEEDED FOR WHEEZING OR SHORTNESS OF BREATH. 01/13/19   Leslye Peer, MD  albuterol (VENTOLIN HFA) 108 (90 Base) MCG/ACT inhaler Inhale 2 puffs into the lungs every 6 (six) hours as  needed for wheezing or shortness of breath.    [provider]  atorvastatin (LIPITOR) 20 MG tablet Take 1 tablet (20 mg total) by mouth daily at 6 PM. Patient taking differently: Take 20 mg by mouth daily.  11/22/18   Esperanza Sheets, MD  carvedilol (COREG) 12.5 MG tablet Take 1 tablet (12.5 mg total) by mouth 2 (two) times daily with a meal. 11/22/18   Buriev, Isaiah Serge, MD  donepezil (ARICEPT) 5 MG tablet Take 5 mg by mouth at bedtime. 03/12/19   [provider]  furosemide (LASIX) 40 MG tablet Take 1 tablet (40 mg total) by mouth daily. Please make appt for future refills. 12/19/18   Quintella Reichert, MD  insulin degludec (TRESIBA FLEXTOUCH) 100 UNIT/ML FlexTouch Pen Inject 0.15 mLs (15 Units total) into the skin daily. 01/15/20   Romero Belling, MD  Insulin Pen Needle (PEN NEEDLES) 31G X 6 MM MISC 1 each by Does not apply route daily. E11.9 01/19/20    Romero Belling, MD  lisinopril (ZESTRIL) 20 MG tablet Take 20 mg by mouth daily. 03/31/19   [provider]  OXYGEN Inhale 2.5 L into the lungs continuous.    [provider]  pantoprazole (PROTONIX) 40 MG tablet Take 1 tablet (40 mg total) by mouth daily. 12/17/17   Quintella Reichert, MD  Tiotropium Bromide-Olodaterol (STIOLTO RESPIMAT) 2.5-2.5 MCG/ACT AERS USE 2 PUFFS ONCE A DAY Patient taking differently: Inhale 2 puffs into the lungs daily.  01/10/19   Leslye Peer, MD  vitamin B-12 (CYANOCOBALAMIN) 1000 MCG tablet Take 1,000 mcg by mouth daily.    [provider]    Allergies Patient has no known allergies.   REVIEW OF SYSTEMS  Negative except as noted here or in the History of Present Illness.   PHYSICAL EXAMINATION  Initial Vital Signs Blood pressure (!) 168/98, pulse 94, temperature 98 F (36.7 C), temperature source Oral, resp. rate 18, SpO2 94 %.  Examination General: Well-developed, well-nourished male in no acute distress; appearance consistent with age of record HENT: normocephalic; atraumatic Eyes: pupils equal, round and reactive to light; extraocular muscles intact; arcus senilis bilaterally Neck: supple Heart: regular rate and rhythm Lungs: clear to auscultation bilaterally Abdomen: soft; nondistended; right inguinal tenderness without appreciable mass; bowel sounds present Extremities: No deformity; full range of motion; edema and chronic appearing skin changes of lower leg Neurologic: Awake, alert and oriented; motor function intact in all extremities and symmetric; no facial droop Skin: Warm and dry; nontender cystic mass of skin given of lower abdomen/upper pubic region Psychiatric: Normal mood and affect   RESULTS  Summary of this visit's results, reviewed and interpreted by myself:   EKG Interpretation  Date/Time:    Ventricular Rate:    PR Interval:    QRS Duration:   QT Interval:    QTC Calculation:   R Axis:     Text  Interpretation:        Laboratory Studies: Results for orders placed or performed during the hospital encounter of 06/28/20 (from the past 24 hour(s))  CBC with Differential/Platelet     Status: Abnormal   Collection Time: 06/28/20  1:11 AM  Result Value Ref Range   WBC 7.7 4.0 - 10.5 K/uL   RBC 4.64 4.22 - 5.81 MIL/uL   Hemoglobin 12.8 (L) 13.0 - 17.0 g/dL   HCT 43.3 29.5 - 18.8 %   MCV 94.4 80.0 - 100.0 fL   MCH 27.6 26.0 - 34.0 pg  MCHC 29.2 (L) 30.0 - 36.0 g/dL   RDW 07.6 22.6 - 33.3 %   Platelets 159 150 - 400 K/uL   nRBC 0.0 0.0 - 0.2 %   Neutrophils Relative % 69 %   Neutro Abs 5.2 1.7 - 7.7 K/uL   Lymphocytes Relative 17 %   Lymphs Abs 1.3 0.7 - 4.0 K/uL   Monocytes Relative 11 %   Monocytes Absolute 0.8 0.1 - 1.0 K/uL   Eosinophils Relative 3 %   Eosinophils Absolute 0.3 0.0 - 0.5 K/uL   Basophils Relative 0 %   Basophils Absolute 0.0 0.0 - 0.1 K/uL   Immature Granulocytes 0 %   Abs Immature Granulocytes 0.02 0.00 - 0.07 K/uL  Basic metabolic panel     Status: Abnormal   Collection Time: 06/28/20  5:02 AM  Result Value Ref Range   Sodium 140 135 - 145 mmol/L   Potassium 3.8 3.5 - 5.1 mmol/L   Chloride 101 98 - 111 mmol/L   CO2 31 22 - 32 mmol/L   Glucose, Bld 137 (H) 70 - 99 mg/dL   BUN 21 8 - 23 mg/dL   Creatinine, Ser 5.45 (H) 0.61 - 1.24 mg/dL   Calcium 8.5 (L) 8.9 - 10.3 mg/dL   GFR, Estimated 56 (L) >60 mL/min   Anion gap 8 5 - 15   Imaging Studies: CT ABDOMEN PELVIS WO CONTRAST  Result Date: 06/28/2020 CLINICAL DATA:  80 year old male with abdominal pain. Fall yesterday. EXAM: CT ABDOMEN AND PELVIS WITHOUT CONTRAST TECHNIQUE: Multidetector CT imaging of the abdomen and pelvis was performed following the standard protocol without IV contrast. COMPARISON:  CT Abdomen and Pelvis 10/16/2016. FINDINGS: Lower chest: Negative. No cardiomegaly, pericardial or pleural effusion. Hepatobiliary: Negative noncontrast liver and gallbladder. No perihepatic fluid  identified. Pancreas: Stable atrophy. Spleen: Stable, negative. Adrenals/Urinary Tract: Stable adrenal gland thickening. Noncontrast kidneys and ureters appear stable and negative. Bladder is diminutive, negative. Chronic pelvic phleboliths. Stomach/Bowel: Rectosigmoid retained stool. Abundant retained stool also in the transverse colon. No large bowel inflammation. Conspicuous but normal appendix which crosses midline to the left on coronal image 64. No large bowel inflammation. Negative terminal ileum. No dilated small bowel. Stomach and duodenum appear negative. No free air, free fluid identified. Vascular/Lymphatic: Extensive Aortoiliac calcified atherosclerosis. Normal caliber abdominal aorta. Vascular patency is not evaluated in the absence of IV contrast. No lymphadenopathy is evident. Reproductive: Right hydrocele with low-density fluid appears small. Trace fat containing right inguinal hernia. Other: No pelvic free fluid. No acute superficial soft tissue injury identified. Musculoskeletal: Lower thoracic and lumbar spine degeneration appears stable, with chronic lower lumbar facet hypertrophy and ankylosis. SI joint degeneration appears stable. Pelvis and proximal femurs appear intact. No acute osseous abnormality identified. IMPRESSION: 1. No acute traumatic injury, acute or inflammatory process identified in the noncontrast abdomen or pelvis. 2. Small right scrotal hydrocele. Abundant retained stool in the colon. 3. Aortic Atherosclerosis (ICD10-I70.0). Electronically Signed   By: Odessa Fleming M.D.   On: 06/28/2020 05:59    ED COURSE and MDM  Nursing notes, initial and subsequent vitals signs, including pulse oximetry, reviewed and interpreted by myself.  Vitals:   06/27/20 2337 06/28/20 0141 06/28/20 0230 06/28/20 0502  BP: (!) 168/98 (!) 167/94 (!) 167/84 133/71  Pulse: 94 (!) 101 99 98  Resp: 18 17 16 16   Temp: 98 F (36.7 C)   98 F (36.7 C)  TempSrc: Oral   Oral  SpO2: 94% 95% 93% 91%    Medications -  No data to display  6:05 AM Patient denies groin pain at the present time. His right inguinal region is nontender. CT scan negative for hernia or other acute process. A small right scrotal hydrocele would not be expected to cause significant pain.  PROCEDURES  Procedures   ED DIAGNOSES     ICD-10-CM   1. Right groin pain  R10.31        Shayon Trompeter, Jonny Ruiz, MD 06/28/20 [phone]

## 2020-07-04 DIAGNOSIS — E1151 Type 2 diabetes mellitus with diabetic peripheral angiopathy without gangrene: Secondary | ICD-10-CM | POA: Diagnosis not present

## 2020-07-04 DIAGNOSIS — K219 Gastro-esophageal reflux disease without esophagitis: Secondary | ICD-10-CM | POA: Diagnosis not present

## 2020-07-04 DIAGNOSIS — E785 Hyperlipidemia, unspecified: Secondary | ICD-10-CM | POA: Diagnosis not present

## 2020-07-04 DIAGNOSIS — E1165 Type 2 diabetes mellitus with hyperglycemia: Secondary | ICD-10-CM | POA: Diagnosis not present

## 2020-07-04 DIAGNOSIS — J441 Chronic obstructive pulmonary disease with (acute) exacerbation: Secondary | ICD-10-CM | POA: Diagnosis not present

## 2020-07-04 DIAGNOSIS — J449 Chronic obstructive pulmonary disease, unspecified: Secondary | ICD-10-CM | POA: Diagnosis not present

## 2020-07-04 DIAGNOSIS — I10 Essential (primary) hypertension: Secondary | ICD-10-CM | POA: Diagnosis not present

## 2020-07-10 DIAGNOSIS — K4090 Unilateral inguinal hernia, without obstruction or gangrene, not specified as recurrent: Secondary | ICD-10-CM | POA: Diagnosis not present

## 2020-07-10 DIAGNOSIS — I10 Essential (primary) hypertension: Secondary | ICD-10-CM | POA: Diagnosis not present

## 2020-07-11 ENCOUNTER — Other Ambulatory Visit: Payer: Self-pay

## 2020-07-11 ENCOUNTER — Ambulatory Visit (INDEPENDENT_AMBULATORY_CARE_PROVIDER_SITE_OTHER): Payer: HMO | Admitting: Orthopedic Surgery

## 2020-07-11 ENCOUNTER — Telehealth: Payer: Self-pay | Admitting: Orthopedic Surgery

## 2020-07-11 ENCOUNTER — Ambulatory Visit (INDEPENDENT_AMBULATORY_CARE_PROVIDER_SITE_OTHER): Payer: HMO

## 2020-07-11 DIAGNOSIS — I872 Venous insufficiency (chronic) (peripheral): Secondary | ICD-10-CM | POA: Diagnosis not present

## 2020-07-11 DIAGNOSIS — M542 Cervicalgia: Secondary | ICD-10-CM | POA: Diagnosis not present

## 2020-07-11 MED ORDER — PREDNISONE 10 MG PO TABS: 10.0000 mg | ORAL_TABLET | Freq: Every day | ORAL | 0 refills | Status: DC

## 2020-07-11 MED ORDER — PREDNISONE 10 MG PO TABS: 10.0000 mg | ORAL_TABLET | Freq: Every day | ORAL | 0 refills | Status: AC

## 2020-07-11 NOTE — Telephone Encounter (Signed)
Pt would like to [facility] and would like a CB once that's been switched.   [phone]

## 2020-07-11 NOTE — Telephone Encounter (Signed)
I called and sw pt's wife to advise that the rx has been sent to the pharm as requested. To call with any other questions.

## 2020-07-12 ENCOUNTER — Encounter: Payer: Self-pay | Admitting: Orthopedic Surgery

## 2020-07-12 NOTE — Progress Notes (Signed)
Office Visit Note   Patient: Ivan Parks           Date of Birth: 1941/04/29           MRN: [id number] Visit Date: 07/11/2020              Requested by: Laurann Montana, MD [phone] [address] [place],  [place] [address] PCP: Laurann Montana, MD  Chief Complaint  Patient presents with  . Left Leg - Follow-up  . Right Leg - Follow-up  . Neck - Pain      HPI: Patient is an 80 year old gentleman who presents for two separate issues.  Patient has had chronic venous insufficiency and has been wearing compression stockings.  #2 patient complains of right-sided neck pain denies any radicular symptoms down his arms denies any focal weakness.  Patient states he has had multiple falls that have been evaluated emergency room and the work-up was negative.  Assessment & Plan: Visit Diagnoses:  1. Neck pain   2. Venous insufficiency (chronic) (peripheral)     Plan: Recommended low-dose prednisone 10 mg a day for the cervical spine symptoms.  Reevaluate in 3 weeks continue with his compression stockings.  Follow-Up Instructions: Return in about 3 weeks (around 08/01/2020).   Ortho Exam  Patient is alert, oriented, no adenopathy, well-dressed, normal affect, normal respiratory effort. Examination patient has isolated cervical spine pain on the right paraspinous muscles.  Radiates to his shoulder but not distal.  Has no focal motor weakness in either upper extremity patient cannot straighten his neck to neutral with a significant kyphosis he has minimal internal and external rotation.  Examination of both legs he has venous stasis insufficiency in both legs there are no open ulcers there is brawny skin color changes.  Imaging: No results found. No images are attached to the encounter.  Labs: Lab Results  Component Value Date   HGBA1C 10.9 (A) 01/15/2020   HGBA1C 8.1 (H) 11/16/2018   HGBA1C 8.3 (A) 10/28/2018   ESRSEDRATE 4 11/09/2010   REPTSTATUS 11/20/2018 FINAL 11/15/2018    GRAMSTAIN  11/04/2010    MODERATE WBC PRESENT, PREDOMINANTLY PMN NO SQUAMOUS EPITHELIAL CELLS SEEN FEW GRAM NEGATIVE RODS   CULT  11/15/2018    NO GROWTH 5 DAYS Performed at [facility], [address], [place], [place] [address]      Lab Results  Component Value Date   ALBUMIN 3.3 (L) 04/07/2019   ALBUMIN 3.1 (L) 12/14/2018   ALBUMIN 2.8 (L) 11/19/2018    Lab Results  Component Value Date   MG 1.8 11/21/2018   MG 1.7 11/20/2018   MG 2.1 11/04/2010   No results found for: VD25OH  No results found for: PREALBUMIN CBC EXTENDED Latest Ref Rng & Units 06/28/2020 04/07/2019 04/07/2019  WBC 4.0 - 10.5 K/uL 7.7 - 8.2  RBC 4.22 - 5.81 MIL/uL 4.64 - 4.74  HGB 13.0 - 17.0 g/dL 12.8(L) 14.3 13.2  HCT 39.0 - 52.0 % 43.8 42.0 43.3  PLT 150 - 400 K/uL 159 - 187  NEUTROABS 1.7 - 7.7 K/uL 5.2 - -  LYMPHSABS 0.7 - 4.0 K/uL 1.3 - -     There is no height or weight on file to calculate BMI.  Orders:  Orders Placed This Encounter  Procedures  . XR Cervical Spine 2 or 3 views   Meds ordered this encounter  Medications  . DISCONTD: predniSONE (DELTASONE) 10 MG tablet    Sig: Take 1 tablet (10 mg  total) by mouth daily with breakfast.    Dispense:  30 tablet    Refill:  0     Procedures: No procedures performed  Clinical Data: No additional findings.  ROS:  All other systems negative, except as noted in the HPI. Review of Systems  Objective: Vital Signs: There were no vitals taken for this visit.  Specialty Comments:  No specialty comments available.  PMFS History: Patient Active Problem List   Diagnosis Date Noted  . Infection 11/15/2018  . 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 Chest pain 12/02/2017  . Peripheral vascular disease (HCC)   . Hyperlipidemia   . CHF (congestive heart failure) (HCC)   . Diabetes (HCC) 05/02/2016  . AKI (acute kidney injury) (HCC) 04/01/2016  . Hypotension 03/31/2016  . Essential hypertension 03/06/2015  . Chronic  respiratory failure (HCC) 11/26/2014  . Leg edema, right- Greater than Left leg 07/05/2014  . Allergic rhinitis 11/09/2012  . 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 Acute respiratory failure (HCC)   . AKI (acute kidney injury) (HCC) 04/01/2016  . Bilateral lower extremity edema   . Bronchitis   . Chest pain 12/02/2017  . CHF (congestive heart failure) (HCC)   . Chronic respiratory failure (HCC) 11/26/2014  . COPD (chronic obstructive pulmonary disease) (HCC)   . 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 Dementia (HCC)   . Diabetes (HCC) 05/02/2016  . DM (diabetes mellitus) (HCC)   . Essential hypertension 03/06/2015   hypertension   . HTN (hypertension)   . Hyperlipidemia   . Hypotension 03/31/2016  . Hypoxemia   . Leg edema, right- Greater than Left leg 07/05/2014   Lower extremity edema   . OSA (obstructive sleep apnea)   . Peripheral vascular disease (HCC)   . Tobacco abuse     Family History  Problem Relation Age of Onset  . Diabetes Mother   . Hypertension Mother   . Peripheral vascular disease Mother        amputation  . Diabetes Father        Right Leg Amputation-Gangrene  . Hypertension Father   . Peripheral vascular disease Father        amputation  . Cancer Father        Lead Poison-Ca  . Pneumonia Father   . Diabetes Sister   . Hypertension Sister   . Diabetes Daughter   . Hypertension Daughter     Past Surgical History:  Procedure Laterality Date  . COLON SURGERY     Social History   Occupational History  . Occupation: retired  Tobacco Use  . Smoking status: Former Smoker    Packs/day: 1.50    Years: 50.00    Pack years: 75.00    Types: Cigarettes    Quit date: 11/04/2010    Years since quitting: 9.6  . Smokeless tobacco: Never Used  Vaping Use  . Vaping Use: Never used   Substance and Sexual Activity  . Alcohol use: No    Alcohol/week: 0.0 standard drinks  . Drug use: No  . Sexual activity: Not on file

## 2020-07-30 ENCOUNTER — Inpatient Hospital Stay (HOSPITAL_COMMUNITY)
Admission: EM | Admit: 2020-07-30 | Discharge: 2020-08-16 | DRG: 871 | Disposition: E | Payer: HMO | Attending: Internal Medicine | Admitting: Internal Medicine

## 2020-07-30 ENCOUNTER — Other Ambulatory Visit: Payer: Self-pay

## 2020-07-30 ENCOUNTER — Ambulatory Visit (INDEPENDENT_AMBULATORY_CARE_PROVIDER_SITE_OTHER): Payer: HMO | Admitting: Physician Assistant

## 2020-07-30 ENCOUNTER — Encounter: Payer: Self-pay | Admitting: Orthopedic Surgery

## 2020-07-30 ENCOUNTER — Encounter (HOSPITAL_COMMUNITY): Payer: Self-pay

## 2020-07-30 DIAGNOSIS — Z743 Need for continuous supervision: Secondary | ICD-10-CM | POA: Diagnosis not present

## 2020-07-30 DIAGNOSIS — Z66 Do not resuscitate: Secondary | ICD-10-CM | POA: Diagnosis not present

## 2020-07-30 DIAGNOSIS — Z794 Long term (current) use of insulin: Secondary | ICD-10-CM

## 2020-07-30 DIAGNOSIS — L03317 Cellulitis of buttock: Secondary | ICD-10-CM | POA: Diagnosis present

## 2020-07-30 DIAGNOSIS — R627 Adult failure to thrive: Secondary | ICD-10-CM | POA: Diagnosis present

## 2020-07-30 DIAGNOSIS — R262 Difficulty in walking, not elsewhere classified: Secondary | ICD-10-CM | POA: Diagnosis not present

## 2020-07-30 DIAGNOSIS — M160 Bilateral primary osteoarthritis of hip: Secondary | ICD-10-CM | POA: Diagnosis not present

## 2020-07-30 DIAGNOSIS — K4090 Unilateral inguinal hernia, without obstruction or gangrene, not specified as recurrent: Secondary | ICD-10-CM | POA: Diagnosis present

## 2020-07-30 DIAGNOSIS — Z833 Family history of diabetes mellitus: Secondary | ICD-10-CM | POA: Diagnosis not present

## 2020-07-30 DIAGNOSIS — J9610 Chronic respiratory failure, unspecified whether with hypoxia or hypercapnia: Secondary | ICD-10-CM | POA: Diagnosis present

## 2020-07-30 DIAGNOSIS — Z7952 Long term (current) use of systemic steroids: Secondary | ICD-10-CM | POA: Diagnosis not present

## 2020-07-30 DIAGNOSIS — M79606 Pain in leg, unspecified: Secondary | ICD-10-CM | POA: Diagnosis not present

## 2020-07-30 DIAGNOSIS — I509 Heart failure, unspecified: Secondary | ICD-10-CM

## 2020-07-30 DIAGNOSIS — R5381 Other malaise: Secondary | ICD-10-CM | POA: Diagnosis present

## 2020-07-30 DIAGNOSIS — I351 Nonrheumatic aortic (valve) insufficiency: Secondary | ICD-10-CM | POA: Diagnosis not present

## 2020-07-30 DIAGNOSIS — N179 Acute kidney failure, unspecified: Secondary | ICD-10-CM | POA: Diagnosis present

## 2020-07-30 DIAGNOSIS — R52 Pain, unspecified: Secondary | ICD-10-CM

## 2020-07-30 DIAGNOSIS — M7071 Other bursitis of hip, right hip: Secondary | ICD-10-CM | POA: Diagnosis not present

## 2020-07-30 DIAGNOSIS — I739 Peripheral vascular disease, unspecified: Secondary | ICD-10-CM

## 2020-07-30 DIAGNOSIS — E871 Hypo-osmolality and hyponatremia: Secondary | ICD-10-CM | POA: Diagnosis present

## 2020-07-30 DIAGNOSIS — E1165 Type 2 diabetes mellitus with hyperglycemia: Secondary | ICD-10-CM | POA: Diagnosis not present

## 2020-07-30 DIAGNOSIS — J9811 Atelectasis: Secondary | ICD-10-CM | POA: Diagnosis not present

## 2020-07-30 DIAGNOSIS — Z9981 Dependence on supplemental oxygen: Secondary | ICD-10-CM

## 2020-07-30 DIAGNOSIS — R4182 Altered mental status, unspecified: Secondary | ICD-10-CM | POA: Diagnosis not present

## 2020-07-30 DIAGNOSIS — I10 Essential (primary) hypertension: Secondary | ICD-10-CM | POA: Diagnosis not present

## 2020-07-30 DIAGNOSIS — I130 Hypertensive heart and chronic kidney disease with heart failure and stage 1 through stage 4 chronic kidney disease, or unspecified chronic kidney disease: Secondary | ICD-10-CM | POA: Diagnosis present

## 2020-07-30 DIAGNOSIS — R109 Unspecified abdominal pain: Secondary | ICD-10-CM | POA: Diagnosis not present

## 2020-07-30 DIAGNOSIS — E43 Unspecified severe protein-calorie malnutrition: Secondary | ICD-10-CM | POA: Diagnosis present

## 2020-07-30 DIAGNOSIS — N432 Other hydrocele: Secondary | ICD-10-CM | POA: Diagnosis present

## 2020-07-30 DIAGNOSIS — G9341 Metabolic encephalopathy: Secondary | ICD-10-CM | POA: Diagnosis present

## 2020-07-30 DIAGNOSIS — N1832 Chronic kidney disease, stage 3b: Secondary | ICD-10-CM | POA: Diagnosis present

## 2020-07-30 DIAGNOSIS — B9562 Methicillin resistant Staphylococcus aureus infection as the cause of diseases classified elsewhere: Secondary | ICD-10-CM

## 2020-07-30 DIAGNOSIS — F0391 Unspecified dementia with behavioral disturbance: Secondary | ICD-10-CM | POA: Diagnosis present

## 2020-07-30 DIAGNOSIS — N433 Hydrocele, unspecified: Secondary | ICD-10-CM | POA: Diagnosis not present

## 2020-07-30 DIAGNOSIS — J449 Chronic obstructive pulmonary disease, unspecified: Secondary | ICD-10-CM | POA: Diagnosis present

## 2020-07-30 DIAGNOSIS — R609 Edema, unspecified: Secondary | ICD-10-CM | POA: Diagnosis not present

## 2020-07-30 DIAGNOSIS — Z79899 Other long term (current) drug therapy: Secondary | ICD-10-CM

## 2020-07-30 DIAGNOSIS — I5032 Chronic diastolic (congestive) heart failure: Secondary | ICD-10-CM | POA: Diagnosis not present

## 2020-07-30 DIAGNOSIS — I110 Hypertensive heart disease with heart failure: Secondary | ICD-10-CM | POA: Diagnosis not present

## 2020-07-30 DIAGNOSIS — R0602 Shortness of breath: Secondary | ICD-10-CM

## 2020-07-30 DIAGNOSIS — Z20822 Contact with and (suspected) exposure to covid-19: Secondary | ICD-10-CM | POA: Diagnosis present

## 2020-07-30 DIAGNOSIS — E1122 Type 2 diabetes mellitus with diabetic chronic kidney disease: Secondary | ICD-10-CM | POA: Diagnosis present

## 2020-07-30 DIAGNOSIS — R638 Other symptoms and signs concerning food and fluid intake: Secondary | ICD-10-CM | POA: Diagnosis not present

## 2020-07-30 DIAGNOSIS — I872 Venous insufficiency (chronic) (peripheral): Secondary | ICD-10-CM | POA: Diagnosis present

## 2020-07-30 DIAGNOSIS — R7881 Bacteremia: Secondary | ICD-10-CM | POA: Diagnosis not present

## 2020-07-30 DIAGNOSIS — J9690 Respiratory failure, unspecified, unspecified whether with hypoxia or hypercapnia: Secondary | ICD-10-CM | POA: Diagnosis not present

## 2020-07-30 DIAGNOSIS — R6520 Severe sepsis without septic shock: Secondary | ICD-10-CM | POA: Diagnosis present

## 2020-07-30 DIAGNOSIS — R600 Localized edema: Secondary | ICD-10-CM | POA: Diagnosis present

## 2020-07-30 DIAGNOSIS — Z87891 Personal history of nicotine dependence: Secondary | ICD-10-CM

## 2020-07-30 DIAGNOSIS — K219 Gastro-esophageal reflux disease without esophagitis: Secondary | ICD-10-CM | POA: Diagnosis not present

## 2020-07-30 DIAGNOSIS — Z7951 Long term (current) use of inhaled steroids: Secondary | ICD-10-CM | POA: Diagnosis not present

## 2020-07-30 DIAGNOSIS — Z515 Encounter for palliative care: Secondary | ICD-10-CM | POA: Diagnosis not present

## 2020-07-30 DIAGNOSIS — E11649 Type 2 diabetes mellitus with hypoglycemia without coma: Secondary | ICD-10-CM | POA: Diagnosis not present

## 2020-07-30 DIAGNOSIS — N5082 Scrotal pain: Secondary | ICD-10-CM | POA: Diagnosis not present

## 2020-07-30 DIAGNOSIS — E1151 Type 2 diabetes mellitus with diabetic peripheral angiopathy without gangrene: Secondary | ICD-10-CM | POA: Diagnosis present

## 2020-07-30 DIAGNOSIS — I5033 Acute on chronic diastolic (congestive) heart failure: Secondary | ICD-10-CM | POA: Diagnosis not present

## 2020-07-30 DIAGNOSIS — I082 Rheumatic disorders of both aortic and tricuspid valves: Secondary | ICD-10-CM | POA: Diagnosis not present

## 2020-07-30 DIAGNOSIS — A4102 Sepsis due to Methicillin resistant Staphylococcus aureus: Principal | ICD-10-CM | POA: Diagnosis present

## 2020-07-30 DIAGNOSIS — E785 Hyperlipidemia, unspecified: Secondary | ICD-10-CM | POA: Diagnosis present

## 2020-07-30 DIAGNOSIS — R1031 Right lower quadrant pain: Secondary | ICD-10-CM

## 2020-07-30 DIAGNOSIS — F05 Delirium due to known physiological condition: Secondary | ICD-10-CM | POA: Diagnosis not present

## 2020-07-30 DIAGNOSIS — L8990 Pressure ulcer of unspecified site, unspecified stage: Secondary | ICD-10-CM | POA: Insufficient documentation

## 2020-07-30 DIAGNOSIS — S31829A Unspecified open wound of left buttock, initial encounter: Secondary | ICD-10-CM | POA: Diagnosis not present

## 2020-07-30 DIAGNOSIS — I361 Nonrheumatic tricuspid (valve) insufficiency: Secondary | ICD-10-CM | POA: Diagnosis not present

## 2020-07-30 DIAGNOSIS — R531 Weakness: Secondary | ICD-10-CM | POA: Diagnosis not present

## 2020-07-30 DIAGNOSIS — L89329 Pressure ulcer of left buttock, unspecified stage: Secondary | ICD-10-CM | POA: Diagnosis not present

## 2020-07-30 DIAGNOSIS — J441 Chronic obstructive pulmonary disease with (acute) exacerbation: Secondary | ICD-10-CM | POA: Diagnosis not present

## 2020-07-30 DIAGNOSIS — R0902 Hypoxemia: Secondary | ICD-10-CM | POA: Diagnosis not present

## 2020-07-30 DIAGNOSIS — R102 Pelvic and perineal pain: Secondary | ICD-10-CM | POA: Diagnosis not present

## 2020-07-30 DIAGNOSIS — Z6826 Body mass index (BMI) 26.0-26.9, adult: Secondary | ICD-10-CM

## 2020-07-30 DIAGNOSIS — L89322 Pressure ulcer of left buttock, stage 2: Secondary | ICD-10-CM | POA: Diagnosis present

## 2020-07-30 DIAGNOSIS — S73191A Other sprain of right hip, initial encounter: Secondary | ICD-10-CM | POA: Diagnosis not present

## 2020-07-30 DIAGNOSIS — G4733 Obstructive sleep apnea (adult) (pediatric): Secondary | ICD-10-CM | POA: Diagnosis present

## 2020-07-30 LAB — CBC
HCT: 44.3 % (ref 39.0–52.0)
Hemoglobin: 13.2 g/dL (ref 13.0–17.0)
MCH: 27.1 pg (ref 26.0–34.0)
MCHC: 29.8 g/dL — ABNORMAL LOW (ref 30.0–36.0)
MCV: 91 fL (ref 80.0–100.0)
Platelets: 227 10*3/uL (ref 150–400)
RBC: 4.87 MIL/uL (ref 4.22–5.81)
RDW: 12.4 % (ref 11.5–15.5)
WBC: 15.7 10*3/uL — ABNORMAL HIGH (ref 4.0–10.5)
nRBC: 0 % (ref 0.0–0.2)

## 2020-07-30 LAB — COMPREHENSIVE METABOLIC PANEL
ALT: 11 U/L (ref 0–44)
AST: 20 U/L (ref 15–41)
Albumin: 2.7 g/dL — ABNORMAL LOW (ref 3.5–5.0)
Alkaline Phosphatase: 73 U/L (ref 38–126)
Anion gap: 10 (ref 5–15)
BUN: 24 mg/dL — ABNORMAL HIGH (ref 8–23)
CO2: 28 mmol/L (ref 22–32)
Calcium: 8.7 mg/dL — ABNORMAL LOW (ref 8.9–10.3)
Chloride: 96 mmol/L — ABNORMAL LOW (ref 98–111)
Creatinine, Ser: 1.94 mg/dL — ABNORMAL HIGH (ref 0.61–1.24)
GFR, Estimated: 34 mL/min — ABNORMAL LOW (ref >60)
Glucose, Bld: 209 mg/dL — ABNORMAL HIGH (ref 70–99)
Potassium: 3.9 mmol/L (ref 3.5–5.1)
Sodium: 134 mmol/L — ABNORMAL LOW (ref 135–145)
Total Bilirubin: 1 mg/dL (ref 0.3–1.2)
Total Protein: 7.7 g/dL (ref 6.5–8.1)

## 2020-07-30 NOTE — Progress Notes (Signed)
Office Visit Note   Patient: Ivan Parks           Date of Birth: 1940/07/16           MRN: [id number] Visit Date: 07/18/2020              Requested by: Laurann Montana, MD [phone] [address] [place],  [place] [address] PCP: Laurann Montana, MD  Chief Complaint  Patient presents with  . Right Leg - Follow-up    CX VSI   . Left Leg - Follow-up  . Neck - Pain      HPI: Patient is a pleasant 80 year old gentleman who follows up for his chronic venous insufficiency.  Also has some neck pain at his last visit.  He feels the prednisone did help a little bit.  His caregiver had been sick and so she was unable to get to his house to wash his feet and change his socks but she is now feeling much better and plans on resuming this 3 times a week he is missing his left great toenail her caregiver was removed by another provider  Assessment & Plan: Visit Diagnoses: No diagnosis found.  Plan: Spoke with his caregiver continue with compression socks.  Cleansing with soap and water.  At the area where his left great toenail has been removed should keep an eye on this.  Do not see any sign of infection at this time  Follow-Up Instructions: No follow-ups on file.   Ortho Exam  Patient is alert, oriented, no adenopathy, well-dressed, normal affect, normal respiratory effort. Examination he has right greater than left venous insufficiency swelling.  No open ulcers he does have an abrasion on the anterior tibia on the right side secondary to a dog jumping up on him.  There is no drainage no surrounding cellulitis.  His compartments are compressible and soft nontender.  He does have wrinkling of the skin.  He has onychomycotic nails.  Great toenail has been removed there is some fibrinous tissue with the nail was no purulent drainage or surrounding cellulitis  Imaging: No results found. No images are attached to the encounter.  Labs: Lab Results  Component Value Date   HGBA1C 10.9 (A)  01/15/2020   HGBA1C 8.1 (H) 11/16/2018   HGBA1C 8.3 (A) 10/28/2018   ESRSEDRATE 4 11/09/2010   REPTSTATUS 11/20/2018 FINAL 11/15/2018   GRAMSTAIN  11/04/2010    MODERATE WBC PRESENT, PREDOMINANTLY PMN NO SQUAMOUS EPITHELIAL CELLS SEEN FEW GRAM NEGATIVE RODS   CULT  11/15/2018    NO GROWTH 5 DAYS Performed at [facility], [address], [place], [place] [address]      Lab Results  Component Value Date   ALBUMIN 3.3 (L) 04/07/2019   ALBUMIN 3.1 (L) 12/14/2018   ALBUMIN 2.8 (L) 11/19/2018    Lab Results  Component Value Date   MG 1.8 11/21/2018   MG 1.7 11/20/2018   MG 2.1 11/04/2010   No results found for: VD25OH  No results found for: PREALBUMIN CBC EXTENDED Latest Ref Rng & Units 06/28/2020 04/07/2019 04/07/2019  WBC 4.0 - 10.5 K/uL 7.7 - 8.2  RBC 4.22 - 5.81 MIL/uL 4.64 - 4.74  HGB 13.0 - 17.0 g/dL 12.8(L) 14.3 13.2  HCT 39.0 - 52.0 % 43.8 42.0 43.3  PLT 150 - 400 K/uL 159 - 187  NEUTROABS 1.7 - 7.7 K/uL 5.2 - -  LYMPHSABS 0.7 - 4.0 K/uL 1.3 - -     There is  no height or weight on file to calculate BMI.  Orders:  No orders of the defined types were placed in this encounter.  No orders of the defined types were placed in this encounter.    Procedures: No procedures performed  Clinical Data: No additional findings.  ROS:  All other systems negative, except as noted in the HPI. Review of Systems  Objective: Vital Signs: There were no vitals taken for this visit.  Specialty Comments:  No specialty comments available.  PMFS History: Patient Active Problem List   Diagnosis Date Noted  . Infection 11/15/2018  . 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 Chest pain 12/02/2017  . Peripheral vascular disease (HCC)   . Hyperlipidemia   . CHF (congestive heart failure) (HCC)   . Diabetes (HCC) 05/02/2016  . AKI (acute kidney injury) (HCC) 04/01/2016  . Hypotension 03/31/2016  . Essential hypertension 03/06/2015  . Chronic respiratory failure  (HCC) 11/26/2014  . Leg edema, right- Greater than Left leg 07/05/2014  . Allergic rhinitis 11/09/2012  . 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 Acute respiratory failure (HCC)   . AKI (acute kidney injury) (HCC) 04/01/2016  . Bilateral lower extremity edema   . Bronchitis   . Chest pain 12/02/2017  . CHF (congestive heart failure) (HCC)   . Chronic respiratory failure (HCC) 11/26/2014  . COPD (chronic obstructive pulmonary disease) (HCC)   . 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 Dementia (HCC)   . Diabetes (HCC) 05/02/2016  . DM (diabetes mellitus) (HCC)   . Essential hypertension 03/06/2015   hypertension   . HTN (hypertension)   . Hyperlipidemia   . Hypotension 03/31/2016  . Hypoxemia   . Leg edema, right- Greater than Left leg 07/05/2014   Lower extremity edema   . OSA (obstructive sleep apnea)   . Peripheral vascular disease (HCC)   . Tobacco abuse     Family History  Problem Relation Age of Onset  . Diabetes Mother   . Hypertension Mother   . Peripheral vascular disease Mother        amputation  . Diabetes Father        Right Leg Amputation-Gangrene  . Hypertension Father   . Peripheral vascular disease Father        amputation  . Cancer Father        Lead Poison-Ca  . Pneumonia Father   . Diabetes Sister   . Hypertension Sister   . Diabetes Daughter   . Hypertension Daughter     Past Surgical History:  Procedure Laterality Date  . COLON SURGERY     Social History   Occupational History  . Occupation: retired  Tobacco Use  . Smoking status: Former Smoker    Packs/day: 1.50    Years: 50.00    Pack years: 75.00    Types: Cigarettes    Quit date: 11/04/2010    Years since quitting: 9.7  . Smokeless tobacco: Never Used  Vaping Use  . Vaping Use: Never used  Substance and  Sexual Activity  . Alcohol use: No    Alcohol/week: 0.0 standard drinks  . Drug use: No  . Sexual activity: Not on file

## 2020-07-30 NOTE — ED Triage Notes (Addendum)
EMS reports Groin pain/ leg weakness  for about a month. EMS reports Pt was seen at [facility] in Feb and PCP today with no findings of reason for groin pain. EMS states family states pt has not eaten in 3 days    CBG 252 130/80 HR 110 O2 98% RA

## 2020-07-31 ENCOUNTER — Emergency Department (HOSPITAL_COMMUNITY): Payer: HMO

## 2020-07-31 ENCOUNTER — Observation Stay (HOSPITAL_COMMUNITY): Payer: HMO

## 2020-07-31 ENCOUNTER — Encounter (HOSPITAL_COMMUNITY): Payer: Self-pay | Admitting: Internal Medicine

## 2020-07-31 ENCOUNTER — Observation Stay (HOSPITAL_BASED_OUTPATIENT_CLINIC_OR_DEPARTMENT_OTHER): Payer: HMO

## 2020-07-31 DIAGNOSIS — M160 Bilateral primary osteoarthritis of hip: Secondary | ICD-10-CM | POA: Diagnosis not present

## 2020-07-31 DIAGNOSIS — E1165 Type 2 diabetes mellitus with hyperglycemia: Secondary | ICD-10-CM | POA: Diagnosis present

## 2020-07-31 DIAGNOSIS — N179 Acute kidney failure, unspecified: Secondary | ICD-10-CM | POA: Diagnosis not present

## 2020-07-31 DIAGNOSIS — N433 Hydrocele, unspecified: Secondary | ICD-10-CM | POA: Diagnosis not present

## 2020-07-31 DIAGNOSIS — N5082 Scrotal pain: Secondary | ICD-10-CM | POA: Diagnosis not present

## 2020-07-31 DIAGNOSIS — K4090 Unilateral inguinal hernia, without obstruction or gangrene, not specified as recurrent: Secondary | ICD-10-CM | POA: Diagnosis not present

## 2020-07-31 DIAGNOSIS — S31829A Unspecified open wound of left buttock, initial encounter: Secondary | ICD-10-CM | POA: Diagnosis not present

## 2020-07-31 DIAGNOSIS — R109 Unspecified abdominal pain: Secondary | ICD-10-CM | POA: Diagnosis not present

## 2020-07-31 DIAGNOSIS — L8990 Pressure ulcer of unspecified site, unspecified stage: Secondary | ICD-10-CM | POA: Insufficient documentation

## 2020-07-31 DIAGNOSIS — R609 Edema, unspecified: Secondary | ICD-10-CM

## 2020-07-31 DIAGNOSIS — L03317 Cellulitis of buttock: Secondary | ICD-10-CM | POA: Diagnosis not present

## 2020-07-31 DIAGNOSIS — S73191A Other sprain of right hip, initial encounter: Secondary | ICD-10-CM | POA: Diagnosis not present

## 2020-07-31 DIAGNOSIS — M7071 Other bursitis of hip, right hip: Secondary | ICD-10-CM | POA: Diagnosis not present

## 2020-07-31 LAB — URINALYSIS, ROUTINE W REFLEX MICROSCOPIC
Bilirubin Urine: NEGATIVE
Glucose, UA: NEGATIVE mg/dL
Ketones, ur: NEGATIVE mg/dL
Leukocytes,Ua: NEGATIVE
Nitrite: NEGATIVE
Protein, ur: 100 mg/dL — AB
Specific Gravity, Urine: 1.015 (ref 1.005–1.030)
pH: 5 (ref 5.0–8.0)

## 2020-07-31 LAB — COMPREHENSIVE METABOLIC PANEL
ALT: 11 U/L (ref 0–44)
AST: 19 U/L (ref 15–41)
Albumin: 2.3 g/dL — ABNORMAL LOW (ref 3.5–5.0)
Alkaline Phosphatase: 60 U/L (ref 38–126)
Anion gap: 10 (ref 5–15)
BUN: 21 mg/dL (ref 8–23)
CO2: 27 mmol/L (ref 22–32)
Calcium: 8.2 mg/dL — ABNORMAL LOW (ref 8.9–10.3)
Chloride: 97 mmol/L — ABNORMAL LOW (ref 98–111)
Creatinine, Ser: 1.64 mg/dL — ABNORMAL HIGH (ref 0.61–1.24)
GFR, Estimated: 42 mL/min — ABNORMAL LOW (ref >60)
Glucose, Bld: 154 mg/dL — ABNORMAL HIGH (ref 70–99)
Potassium: 3.6 mmol/L (ref 3.5–5.1)
Sodium: 134 mmol/L — ABNORMAL LOW (ref 135–145)
Total Bilirubin: 1 mg/dL (ref 0.3–1.2)
Total Protein: 6.8 g/dL (ref 6.5–8.1)

## 2020-07-31 LAB — CBC WITH DIFFERENTIAL/PLATELET
Abs Immature Granulocytes: 0.07 10*3/uL (ref 0.00–0.07)
Basophils Absolute: 0 10*3/uL (ref 0.0–0.1)
Basophils Relative: 0 %
Eosinophils Absolute: 0.1 10*3/uL (ref 0.0–0.5)
Eosinophils Relative: 0 %
HCT: 40.7 % (ref 39.0–52.0)
Hemoglobin: 12.5 g/dL — ABNORMAL LOW (ref 13.0–17.0)
Immature Granulocytes: 0 %
Lymphocytes Relative: 6 %
Lymphs Abs: 0.9 10*3/uL (ref 0.7–4.0)
MCH: 27.4 pg (ref 26.0–34.0)
MCHC: 30.7 g/dL (ref 30.0–36.0)
MCV: 89.1 fL (ref 80.0–100.0)
Monocytes Absolute: 1.6 10*3/uL — ABNORMAL HIGH (ref 0.1–1.0)
Monocytes Relative: 11 %
Neutro Abs: 13 10*3/uL — ABNORMAL HIGH (ref 1.7–7.7)
Neutrophils Relative %: 83 %
Platelets: 190 10*3/uL (ref 150–400)
RBC: 4.57 MIL/uL (ref 4.22–5.81)
RDW: 12.5 % (ref 11.5–15.5)
WBC: 15.7 10*3/uL — ABNORMAL HIGH (ref 4.0–10.5)
nRBC: 0 % (ref 0.0–0.2)

## 2020-07-31 LAB — GLUCOSE, CAPILLARY
Glucose-Capillary: 145 mg/dL — ABNORMAL HIGH (ref 70–99)
Glucose-Capillary: 149 mg/dL — ABNORMAL HIGH (ref 70–99)

## 2020-07-31 LAB — SARS CORONAVIRUS 2 (TAT 6-24 HRS): SARS Coronavirus 2: NEGATIVE

## 2020-07-31 MED ORDER — ENOXAPARIN SODIUM 40 MG/0.4ML ~~LOC~~ SOLN: 40.0000 mg | Freq: Every day | SUBCUTANEOUS | Status: DC

## 2020-07-31 MED ORDER — INSULIN GLARGINE 100 UNIT/ML ~~LOC~~ SOLN
15.0000 [IU] | Freq: Every day | SUBCUTANEOUS | Status: DC
  Administered 2020-07-31 – 2020-08-03 (×4): 15 [IU] via SUBCUTANEOUS
  Filled 2020-07-31 (×5): qty 0.15

## 2020-07-31 MED ORDER — CARVEDILOL 12.5 MG PO TABS
12.5000 mg | ORAL_TABLET | Freq: Two times a day (BID) | ORAL | Status: DC
  Administered 2020-07-31 – 2020-08-10 (×14): 12.5 mg via ORAL
  Filled 2020-07-31 (×18): qty 1

## 2020-07-31 MED ORDER — ATORVASTATIN CALCIUM 10 MG PO TABS
20.0000 mg | ORAL_TABLET | Freq: Every day | ORAL | Status: DC
  Administered 2020-07-31 – 2020-08-09 (×6): 20 mg via ORAL
  Filled 2020-07-31 (×9): qty 2

## 2020-07-31 MED ORDER — LACTATED RINGERS IV BOLUS
1000.0000 mL | Freq: Once | INTRAVENOUS | Status: AC
  Administered 2020-07-31: 1000 mL via INTRAVENOUS

## 2020-07-31 MED ORDER — ENOXAPARIN SODIUM 30 MG/0.3ML ~~LOC~~ SOLN
30.0000 mg | Freq: Every day | SUBCUTANEOUS | Status: DC
  Administered 2020-07-31 – 2020-08-01 (×2): 30 mg via SUBCUTANEOUS
  Filled 2020-07-31 (×2): qty 0.3

## 2020-07-31 MED ORDER — DONEPEZIL HCL 10 MG PO TABS
10.0000 mg | ORAL_TABLET | Freq: Every day | ORAL | Status: DC
  Administered 2020-07-31 – 2020-08-10 (×9): 10 mg via ORAL
  Filled 2020-07-31 (×9): qty 1

## 2020-07-31 MED ORDER — ONDANSETRON HCL 4 MG/2ML IJ SOLN
4.0000 mg | Freq: Once | INTRAMUSCULAR | Status: DC
  Filled 2020-07-31: qty 2

## 2020-07-31 MED ORDER — ACETAMINOPHEN 325 MG PO TABS
650.0000 mg | ORAL_TABLET | Freq: Four times a day (QID) | ORAL | Status: DC | PRN
  Administered 2020-08-01 – 2020-08-11 (×5): 650 mg via ORAL
  Filled 2020-07-31 (×5): qty 2

## 2020-07-31 MED ORDER — PANTOPRAZOLE SODIUM 40 MG PO TBEC
40.0000 mg | DELAYED_RELEASE_TABLET | Freq: Every day | ORAL | Status: DC
  Administered 2020-07-31 – 2020-08-09 (×6): 40 mg via ORAL
  Filled 2020-07-31 (×9): qty 1

## 2020-07-31 MED ORDER — VITAMIN B-12 1000 MCG PO TABS
1000.0000 ug | ORAL_TABLET | Freq: Every day | ORAL | Status: DC
  Administered 2020-07-31 – 2020-08-10 (×7): 1000 ug via ORAL
  Filled 2020-07-31 (×11): qty 1

## 2020-07-31 MED ORDER — CEFAZOLIN SODIUM-DEXTROSE 2-4 GM/100ML-% IV SOLN
2.0000 g | Freq: Three times a day (TID) | INTRAVENOUS | Status: DC
  Administered 2020-07-31 – 2020-08-01 (×3): 2 g via INTRAVENOUS
  Filled 2020-07-31 (×4): qty 100

## 2020-07-31 MED ORDER — ACETAMINOPHEN 650 MG RE SUPP: 650.0000 mg | Freq: Four times a day (QID) | RECTAL | Status: DC | PRN

## 2020-07-31 MED ORDER — ALBUTEROL SULFATE HFA 108 (90 BASE) MCG/ACT IN AERS: 2.0000 | INHALATION_SPRAY | Freq: Four times a day (QID) | RESPIRATORY_TRACT | Status: DC | PRN

## 2020-07-31 NOTE — H&P (Addendum)
History and Physical    Ivan Parks UQJ:[id number] DOB: 1941/03/18 DOA: 07-31-2020  PCP: Laurann Montana, MD  Patient coming from: Home.  Chief Complaint: Right groin pain and poor appetite.  HPI: Ivan Parks is a 80 y.o. male with history of diabetes mellitus type 2, diastolic CHF, chronic venous insufficiency with chronic edema, chronic kidney disease stage III baseline creatinine around 1.3, COPD on home oxygen presents to the ER for the second time in the last 1 month with complaints of right groin pain.  But at this time patient also has been having poor appetite for the last 3 days hardly eating anything.  Denies vomiting or diarrhea.  Pain in the groin is mostly on the right side.  ED Course: In the ER labs showed leukocytosis with worsening renal function creatinine increased from 1.3 on June 28, 2020 it is around 1.9 with leukocytosis of 15.7.  On exam patient has mild tenderness the right groin area with CT renal study showing nothing acute and ultrasound of the scrotum showing nothing unremarkable except for the hydrocele.  Patient was given fluid bolus in the ER.  Covid test is still pending.  Admitted for worsening renal function with poor appetite with leukocytosis cause not clear.  Review of Systems: As per HPI, rest all negative.   Past Medical History:  Diagnosis Date  . Acute respiratory failure (HCC)   . AKI (acute kidney injury) (HCC) 04/01/2016  . Bilateral lower extremity edema   . Bronchitis   . Chest pain 12/02/2017  . CHF (congestive heart failure) (HCC)   . Chronic respiratory failure (HCC) 11/26/2014  . COPD (chronic obstructive pulmonary disease) (HCC)   . 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 Dementia (HCC)   . Diabetes (HCC) 05/02/2016  . DM (diabetes mellitus) (HCC)   . Essential hypertension 03/06/2015    hypertension   . HTN (hypertension)   . Hyperlipidemia   . Hypotension 03/31/2016  . Hypoxemia   . Leg edema, right- Greater than Left leg 07/05/2014   Lower extremity edema   . OSA (obstructive sleep apnea)   . Peripheral vascular disease (HCC)   . Tobacco abuse     Past Surgical History:  Procedure Laterality Date  . COLON SURGERY       reports that he quit smoking about 9 years ago. His smoking use included cigarettes. He has a 75.00 pack-year smoking history. He has never used smokeless tobacco. He reports that he does not drink alcohol and does not use drugs.  No Known Allergies  Family History  Problem Relation Age of Onset  . Diabetes Mother   . Hypertension Mother   . Peripheral vascular disease Mother        amputation  . Diabetes Father        Right Leg Amputation-Gangrene  . Hypertension Father   . Peripheral vascular disease Father        amputation  . Cancer Father        Lead Poison-Ca  . Pneumonia Father   . Diabetes Sister   . Hypertension Sister   . Diabetes Daughter   . Hypertension Daughter     Prior to Admission medications   Medication Sig Start Date End Date Taking? Authorizing Provider  acetaminophen (TYLENOL) 325 MG tablet Take 2 tablets (  650 mg total) by mouth every 6 (six) hours as needed for mild pain (or Fever >/= 101). 11/22/18   Buriev, Isaiah Serge, MD  albuterol (PROVENTIL) (2.5 MG/3ML) 0.083% nebulizer solution TAKE 3 MLS (2.5 MG TOTAL) BY NEBULIZATION EVERY 6 (SIX) HOURS AS NEEDED FOR WHEEZING OR SHORTNESS OF BREATH. 01/13/19   Leslye Peer, MD  albuterol (VENTOLIN HFA) 108 (90 Base) MCG/ACT inhaler Inhale 2 puffs into the lungs every 6 (six) hours as needed for wheezing or shortness of breath.    [provider]  atorvastatin (LIPITOR) 20 MG tablet Take 1 tablet (20 mg total) by mouth daily at 6 PM. Patient taking differently: Take 20 mg by mouth daily.  11/22/18   Esperanza Sheets, MD  carvedilol (COREG) 12.5 MG tablet Take 1  tablet (12.5 mg total) by mouth 2 (two) times daily with a meal. 11/22/18   Buriev, Isaiah Serge, MD  donepezil (ARICEPT) 5 MG tablet Take 5 mg by mouth at bedtime. 03/12/19   [provider]  furosemide (LASIX) 40 MG tablet Take 1 tablet (40 mg total) by mouth daily. Please make appt for future refills. 12/19/18   Quintella Reichert, MD  insulin degludec (TRESIBA FLEXTOUCH) 100 UNIT/ML FlexTouch Pen Inject 0.15 mLs (15 Units total) into the skin daily. 01/15/20   Romero Belling, MD  Insulin Pen Needle (PEN NEEDLES) 31G X 6 MM MISC 1 each by Does not apply route daily. E11.9 01/19/20   Romero Belling, MD  lisinopril (ZESTRIL) 20 MG tablet Take 20 mg by mouth daily. 03/31/19   [provider]  OXYGEN Inhale 2.5 L into the lungs continuous.    [provider]  pantoprazole (PROTONIX) 40 MG tablet Take 1 tablet (40 mg total) by mouth daily. 12/17/17   Quintella Reichert, MD  predniSONE (DELTASONE) 10 MG tablet Take 1 tablet (10 mg total) by mouth daily with breakfast. 07/11/20   Nadara Mustard, MD  Tiotropium Bromide-Olodaterol (STIOLTO RESPIMAT) 2.5-2.5 MCG/ACT AERS USE 2 PUFFS ONCE A DAY Patient taking differently: Inhale 2 puffs into the lungs daily.  01/10/19   Leslye Peer, MD  vitamin B-12 (CYANOCOBALAMIN) 1000 MCG tablet Take 1,000 mcg by mouth daily.    [provider]    Physical Exam: Constitutional: Moderately built and nourished. Vitals:   07/31/20 0115 07/31/20 0230 07/31/20 0400 07/31/20 0455  BP: (!) 153/129 (!) 159/83 138/83 (!) 152/56  Pulse: (!) 103 (!) 105 (!) 111 (!) 102  Resp: 20 19 (!) 27 (!) 23  Temp:    99.3 F (37.4 C)  TempSrc:    Oral  SpO2: 96% 95% 100% 100%  Weight:      Height:       Eyes: Anicteric no pallor. ENMT: No discharge from the ears eyes nose or mouth. Neck: No mass felt.  No neck rigidity. Respiratory: No rhonchi or crepitations. Cardiovascular: S1-S2 heard. Abdomen: Soft nontender bowel sounds present.  Right groin  tenderness. Musculoskeletal: Bilateral lower extremity edema present. Skin: Chronic skin changes. Neurologic: Alert awake oriented to time place and person.  Moves all extremities. Psychiatric: Appears normal.  Normal affect.   Labs on Admission: I have personally reviewed following labs and imaging studies  CBC: Recent Labs  Lab 08/08/2020 2151  WBC 15.7*  HGB 13.2  HCT 44.3  MCV 91.0  PLT 227   Basic Metabolic Panel: Recent Labs  Lab 08/14/2020 2151  NA 134*  K 3.9  CL 96*  CO2 28  GLUCOSE  209*  BUN 24*  CREATININE 1.94*  CALCIUM 8.7*   GFR: Estimated Creatinine Clearance: 26.4 mL/min (A) (by C-G formula based on SCr of 1.94 mg/dL (H)). Liver Function Tests: Recent Labs  Lab 07/29/2020 2151  AST 20  ALT 11  ALKPHOS 73  BILITOT 1.0  PROT 7.7  ALBUMIN 2.7*   No results for input(s): LIPASE, AMYLASE in the last 168 hours. No results for input(s): AMMONIA in the last 168 hours. Coagulation Profile: No results for input(s): INR, PROTIME in the last 168 hours. Cardiac Enzymes: No results for input(s): CKTOTAL, CKMB, CKMBINDEX, TROPONINI in the last 168 hours. BNP (last 3 results) No results for input(s): PROBNP in the last 8760 hours. HbA1C: No results for input(s): HGBA1C in the last 72 hours. CBG: No results for input(s): GLUCAP in the last 168 hours. Lipid Profile: No results for input(s): CHOL, HDL, LDLCALC, TRIG, CHOLHDL, LDLDIRECT in the last 72 hours. Thyroid Function Tests: No results for input(s): TSH, T4TOTAL, FREET4, T3FREE, THYROIDAB in the last 72 hours. Anemia Panel: No results for input(s): VITAMINB12, FOLATE, FERRITIN, TIBC, IRON, RETICCTPCT in the last 72 hours. Urine analysis:    Component Value Date/Time   COLORURINE YELLOW 06/28/2020 0501   APPEARANCEUR CLEAR 06/28/2020 0501   LABSPEC 1.016 06/28/2020 0501   PHURINE 7.0 06/28/2020 0501   GLUCOSEU NEGATIVE 06/28/2020 0501   HGBUR NEGATIVE 06/28/2020 0501   BILIRUBINUR NEGATIVE  06/28/2020 0501   KETONESUR NEGATIVE 06/28/2020 0501   PROTEINUR 100 (A) 06/28/2020 0501   UROBILINOGEN 0.2 09/19/2011 1701   NITRITE NEGATIVE 06/28/2020 0501   LEUKOCYTESUR NEGATIVE 06/28/2020 0501   Sepsis Labs: @LABRCNTIP (procalcitonin:4,lacticidven:4) )No results found for this or any previous visit (from the past 240 hour(s)).   Radiological Exams on Admission: DG Chest Port 1 View  Result Date: 07/31/2020 CLINICAL DATA:  Weakness EXAM: PORTABLE CHEST 1 VIEW COMPARISON:  04/07/2019 FINDINGS: The patient's chin obscures the apices. No focal consolidation or pleural effusion. Stable cardiomediastinal silhouette. No pneumothorax. IMPRESSION: No active disease. Electronically Signed   By: Jasmine PangKim  Fujinaga M.D.   On: 07/31/2020 00:43   CT Renal Stone Study  Result Date: 07/31/2020 CLINICAL DATA:  Inguinal pain, leg weakness for 1 month EXAM: CT ABDOMEN AND PELVIS WITHOUT CONTRAST TECHNIQUE: Multidetector CT imaging of the abdomen and pelvis was performed following the standard protocol without IV contrast. COMPARISON:  06/28/2020 FINDINGS: Lower chest: No acute pleural or parenchymal lung disease. Unenhanced CT was performed per clinician order. Lack of IV contrast limits sensitivity and specificity, especially for evaluation of abdominal/pelvic solid viscera. Hepatobiliary: No focal liver abnormality is seen. No gallstones, gallbladder wall thickening, or biliary dilatation. Pancreas: Unremarkable. No pancreatic ductal dilatation or surrounding inflammatory changes. Spleen: Normal in size without focal abnormality. Adrenals/Urinary Tract: No urinary tract calculi or obstructive uropathy. The adrenals and bladder are stable, with no acute findings. Stomach/Bowel: No bowel obstruction or ileus. Normal appendix right lower quadrant. No bowel wall thickening or inflammatory change. Vascular/Lymphatic: Aortic atherosclerosis. No enlarged abdominal or pelvic lymph nodes. Reproductive: Prostate is  unremarkable. Partial visualization of right hydrocele again noted. Other: No free fluid or free gas. Small fat containing right inguinal hernia again noted. No bowel herniation. Musculoskeletal: No acute or destructive bony lesions. Reconstructed images demonstrate no additional findings. IMPRESSION: 1. Stable small fat containing right inguinal hernia. 2. Right hydrocele again noted, incompletely evaluated on this exam. 3. No evidence of urinary tract calculi or obstructive uropathy. 4.  Aortic Atherosclerosis (ICD10-I70.0). Electronically Signed   By: Casimiro NeedleMichael  Manson Passey M.D.   On: 07/31/2020 00:42   US SCROTUM W/DOPPLER  Result Date: 07/31/2020 CLINICAL DATA:  Scrotal pain. EXAM: SCROTAL ULTRASOUND DOPPLER ULTRASOUND OF THE TESTICLES TECHNIQUE: Complete ultrasound examination of the testicles, epididymis, and other scrotal structures was performed. Color and spectral Doppler ultrasound were also utilized to evaluate blood flow to the testicles. COMPARISON:  CT renal 07/31/2020 FINDINGS: Right testicle Measurements: 3.2 x 2.1 x 2.7 cm. No mass or microlithiasis visualized. Left testicle Measurements: 3.5 x 1.8 x 2.7 cm. No mass or microlithiasis visualized. Right epididymis:  Normal in size and appearance. Left epididymis:  Normal in size and appearance. Hydrocele:  Bilateral hydroceles. Varicocele:  None visualized. Pulsed Doppler interrogation of both testes demonstrates normal low resistance arterial and venous waveforms bilaterally. IMPRESSION: 1. Bilateral hydroceles. 2. Otherwise unremarkable scrotal ultrasound. Electronically Signed   By: Tish Frederickson M.D.   On: 07/31/2020 04:45     Assessment/Plan Principal Problem:   Abdominal pain Active Problems:   COPD, severe (HCC)   Leg edema, right- Greater than Left leg   Essential hypertension   Acute kidney injury (nontraumatic) (HCC)   CHF (congestive heart failure) (HCC)   Controlled type 2 diabetes mellitus with hyperglycemia (HCC)     1. Abdominal pain with poor appetite -pain is mostly in the right groin area.  CT renal study and sonogram of the abdomen only shows hydrocele.  Given that patient has leukocytosis and also mild fever given the history of diabetes mellitus and will get an MRI of the pelvis to see if there is any deep infection or other cause for the pain in the mild fever.  Blood cultures have been obtained.  The patient's appetite does not improve may need GI input. 2. Leukocytosis fever see #1. 3. Acute on chronic kidney disease stage III patient received fluid bolus in the ER.  On exam patient has significant bilateral lower extremity edema present.  Usually takes Lasix lisinopril at home.  Will follow metabolic panel.  Holding Lasix lisinopril for now until we make sure there is no worsening renal function. 4. History of diastolic CHF presently holding Lasix due to worsening renal function.  See #3.  Last EF measured was in 2019 which showed EF of 60 to 65% with grade 1 diastolic dysfunction. 5. Bilateral lower extremity edema appears to be chronic.  Right worse than left.  Check Dopplers.  He usually wears compression stockings. 6. Severe COPD on home oxygen continue inhalers. 7. Hypertension uncontrolled on Coreg and lisinopril holding lisinopril due to worsening renal function.  As needed IV hydralazine. 8. Diabetes mellitus type 2 on long-acting insulin 15 years in the morning.  Follow CBGs closely due to worsening renal function.  Covid test is pending.  DVT prophylaxis: Heparin. Code Status: Full code. Family Communication: Discussed with patient. Disposition Plan: Home. Consults called: None. Admission status: Observation.   Eduard Clos MD [facility] Pager [phone].  If 7PM-7AM, please contact night-coverage [email or web address] Password [facility]  07/31/2020, 4:57 AM

## 2020-07-31 NOTE — Progress Notes (Signed)
PT Cancellation Note  Patient Details Name: Ivan Parks MRN: [id number] DOB: [birth year]   Cancelled Treatment:    Reason Eval/Treat Not Completed: Patient declined, no reason specified politely declines, states "I have been up since 5:30am and I've been too busy!" Unable to convince him to participate today, will try back next date of service at this point.    Madelaine Etienne, DPT, PN1   Supplemental Physical Therapist [facility]    Pager [phone] Acute Rehab Office [phone]

## 2020-07-31 NOTE — ED Provider Notes (Signed)
[facility] Provider Note   CSN: [id number] Arrival date & time: 08/09/2020  2116   History Chief Complaint  Patient presents with  . Groin Pain  . Failure To Thrive    Ivan Parks is a 80 y.o. male.  The history is provided by the patient and the EMS personnel.  Groin Pain  He has history of hypertension, diabetes, hyperlipidemia, peripheral vascular disease, COPD, heart failure, chronic kidney disease and comes in with several complaints.  He has been having pain in the right groin for the last 2 weeks.  Pain is constant.  Nothing makes it better, nothing makes it worse.  For clot for the last 3 days, he has had no appetite and has not eaten anything.  There has been nausea but no vomiting.  He denies fever, chills, sweats.  He denies constipation or diarrhea.  He denies any urinary difficulty.  He has not taken anything for his symptoms.  Past Medical History:  Diagnosis Date  . Acute respiratory failure (HCC)   . AKI (acute kidney injury) (HCC) 04/01/2016  . Bilateral lower extremity edema   . Bronchitis   . Chest pain 12/02/2017  . CHF (congestive heart failure) (HCC)   . Chronic respiratory failure (HCC) 11/26/2014  . COPD (chronic obstructive pulmonary disease) (HCC)   . 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 Dementia (HCC)   . Diabetes (HCC) 05/02/2016  . DM (diabetes mellitus) (HCC)   . Essential hypertension 03/06/2015   hypertension   . HTN (hypertension)   . Hyperlipidemia   . Hypotension 03/31/2016  . Hypoxemia   . Leg edema, right- Greater than Left leg 07/05/2014   Lower extremity edema   . OSA (obstructive sleep apnea)   . Peripheral vascular disease (HCC)   . Tobacco abuse     Patient Active Problem List   Diagnosis Date Noted  . Infection 11/15/2018  . 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 Chest pain 12/02/2017  . Peripheral vascular disease (HCC)   . Hyperlipidemia   . CHF (congestive heart failure) (HCC)   . Diabetes (HCC) 05/02/2016  . AKI (acute kidney injury) (HCC) 04/01/2016  . Hypotension 03/31/2016  . Essential hypertension 03/06/2015  . Chronic respiratory failure (HCC) 11/26/2014  . Leg edema, right- Greater than Left leg 07/05/2014  . Allergic rhinitis 11/09/2012  . 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 COLON SURGERY         Family History  Problem Relation Age of Onset  . Diabetes Mother   . Hypertension Mother   . Peripheral vascular disease Mother        amputation  . Diabetes Father        Right Leg Amputation-Gangrene  . Hypertension Father   . Peripheral vascular disease Father        amputation  . Cancer Father        Lead Poison-Ca  . Pneumonia Father   . Diabetes Sister   . Hypertension Sister   . Diabetes Daughter   . Hypertension Daughter     Social History   Tobacco Use  . Smoking status: Former Smoker    Packs/day: 1.50    Years: 50.00    Pack years: 75.00  Types: Cigarettes    Quit date: 11/04/2010    Years since quitting: 9.7  . Smokeless tobacco: Never Used  Vaping Use  . Vaping Use: Never used  Substance Use Topics  . Alcohol use: No    Alcohol/week: 0.0 standard drinks  . Drug use: No    Home Medications Prior to Admission medications   Medication Sig Start Date End Date Taking? Authorizing Provider  acetaminophen (TYLENOL) 325 MG tablet Take 2 tablets (650 mg total) by mouth every 6 (six) hours as needed for mild pain (or Fever >/= 101). 11/22/18   Buriev, Isaiah Serge, MD  albuterol (PROVENTIL) (2.5 MG/3ML) 0.083% nebulizer solution TAKE 3 MLS (2.5 MG TOTAL) BY NEBULIZATION EVERY 6 (SIX) HOURS AS NEEDED FOR WHEEZING OR SHORTNESS OF BREATH. 01/13/19   Leslye Peer, MD  albuterol (VENTOLIN HFA) 108 (90 Base) MCG/ACT inhaler Inhale 2  puffs into the lungs every 6 (six) hours as needed for wheezing or shortness of breath.    [provider]  atorvastatin (LIPITOR) 20 MG tablet Take 1 tablet (20 mg total) by mouth daily at 6 PM. Patient taking differently: Take 20 mg by mouth daily.  11/22/18   Esperanza Sheets, MD  carvedilol (COREG) 12.5 MG tablet Take 1 tablet (12.5 mg total) by mouth 2 (two) times daily with a meal. 11/22/18   Buriev, Isaiah Serge, MD  donepezil (ARICEPT) 5 MG tablet Take 5 mg by mouth at bedtime. 03/12/19   [provider]  furosemide (LASIX) 40 MG tablet Take 1 tablet (40 mg total) by mouth daily. Please make appt for future refills. 12/19/18   Quintella Reichert, MD  insulin degludec (TRESIBA FLEXTOUCH) 100 UNIT/ML FlexTouch Pen Inject 0.15 mLs (15 Units total) into the skin daily. 01/15/20   Romero Belling, MD  Insulin Pen Needle (PEN NEEDLES) 31G X 6 MM MISC 1 each by Does not apply route daily. E11.9 01/19/20   Romero Belling, MD  lisinopril (ZESTRIL) 20 MG tablet Take 20 mg by mouth daily. 03/31/19   [provider]  OXYGEN Inhale 2.5 L into the lungs continuous.    [provider]  pantoprazole (PROTONIX) 40 MG tablet Take 1 tablet (40 mg total) by mouth daily. 12/17/17   Quintella Reichert, MD  predniSONE (DELTASONE) 10 MG tablet Take 1 tablet (10 mg total) by mouth daily with breakfast. 07/11/20   Nadara Mustard, MD  Tiotropium Bromide-Olodaterol (STIOLTO RESPIMAT) 2.5-2.5 MCG/ACT AERS USE 2 PUFFS ONCE A DAY Patient taking differently: Inhale 2 puffs into the lungs daily.  01/10/19   Leslye Peer, MD  vitamin B-12 (CYANOCOBALAMIN) 1000 MCG tablet Take 1,000 mcg by mouth daily.    [provider]    Allergies    Patient has no known allergies.  Review of Systems   Review of Systems  All other systems reviewed and are negative.   Physical Exam Updated Vital Signs BP (!) 166/134   Pulse 95   Temp 98.4 F (36.9 C)   Resp 16   Ht 5\' 5"  (1.651 m)   Wt 71.2 kg   SpO2  100%   BMI 26.13 kg/m   Physical Exam Vitals and nursing note reviewed.   80 year old male, resting comfortably and in no acute distress. Vital signs are significant for elevated blood pressure. Oxygen saturation is 100%, which is normal. Head is normocephalic and atraumatic. PERRLA, EOMI. Oropharynx is clear. Neck is nontender and supple without adenopathy or JVD. Back is nontender  and there is no CVA tenderness. Lungs are clear without rales, wheezes, or rhonchi. Chest is nontender. Heart has regular rate and rhythm without murmur. Abdomen is soft, flat, nontender without masses or hepatosplenomegaly and peristalsis is normoactive. Genitalia: Uncircumcised penis.  Testes descended without masses.  No scrotal masses or tenderness.  There is tenderness palpation in the right inguinal area without any mass identified, no erythema or warmth. Extremities: 2+ edema of the right lower extremity, 1+ edema of the left lower extremity with moderate venous stasis changes bilaterally. Skin is warm and dry without rash. Neurologic: Awake and alert and oriented to person and place.  He knows the day and month but not the year.  Cranial nerves are intact, there are no motor or sensory deficits.  ED Results / Procedures / Treatments   Labs (all labs ordered are listed, but only abnormal results are displayed) Labs Reviewed  CBC - Abnormal; Notable for the following components:      Result Value   WBC 15.7 (*)    MCHC 29.8 (*)    All other components within normal limits  COMPREHENSIVE METABOLIC PANEL - Abnormal; Notable for the following components:   Sodium 134 (*)    Chloride 96 (*)    Glucose, Bld 209 (*)    BUN 24 (*)    Creatinine, Ser 1.94 (*)    Calcium 8.7 (*)    Albumin 2.7 (*)    GFR, Estimated 34 (*)    All other components within normal limits  SARS CORONAVIRUS 2 (TAT 6-24 HRS)  URINALYSIS, ROUTINE W REFLEX MICROSCOPIC    Radiology DG Chest Port 1 View  Result Date:  07/31/2020 CLINICAL DATA:  Weakness EXAM: PORTABLE CHEST 1 VIEW COMPARISON:  04/07/2019 FINDINGS: The patient's chin obscures the apices. No focal consolidation or pleural effusion. Stable cardiomediastinal silhouette. No pneumothorax. IMPRESSION: No active disease. Electronically Signed   By: Jasmine Pang M.D.   On: 07/31/2020 00:43   CT Renal Stone Study  Result Date: 07/31/2020 CLINICAL DATA:  Inguinal pain, leg weakness for 1 month EXAM: CT ABDOMEN AND PELVIS WITHOUT CONTRAST TECHNIQUE: Multidetector CT imaging of the abdomen and pelvis was performed following the standard protocol without IV contrast. COMPARISON:  06/28/2020 FINDINGS: Lower chest: No acute pleural or parenchymal lung disease. Unenhanced CT was performed per clinician order. Lack of IV contrast limits sensitivity and specificity, especially for evaluation of abdominal/pelvic solid viscera. Hepatobiliary: No focal liver abnormality is seen. No gallstones, gallbladder wall thickening, or biliary dilatation. Pancreas: Unremarkable. No pancreatic ductal dilatation or surrounding inflammatory changes. Spleen: Normal in size without focal abnormality. Adrenals/Urinary Tract: No urinary tract calculi or obstructive uropathy. The adrenals and bladder are stable, with no acute findings. Stomach/Bowel: No bowel obstruction or ileus. Normal appendix right lower quadrant. No bowel wall thickening or inflammatory change. Vascular/Lymphatic: Aortic atherosclerosis. No enlarged abdominal or pelvic lymph nodes. Reproductive: Prostate is unremarkable. Partial visualization of right hydrocele again noted. Other: No free fluid or free gas. Small fat containing right inguinal hernia again noted. No bowel herniation. Musculoskeletal: No acute or destructive bony lesions. Reconstructed images demonstrate no additional findings. IMPRESSION: 1. Stable small fat containing right inguinal hernia. 2. Right hydrocele again noted, incompletely evaluated on this exam.  3. No evidence of urinary tract calculi or obstructive uropathy. 4.  Aortic Atherosclerosis (ICD10-I70.0). Electronically Signed   By: Sharlet Salina M.D.   On: 07/31/2020 00:42    Procedures Procedures   Medications Ordered in ED Medications  ondansetron (ZOFRAN)  injection 4 mg (4 mg Intravenous Patient Refused/Not Given 07/31/20 0257)  lactated ringers bolus 1,000 mL (1,000 mLs Intravenous New Bag/Given 07/31/20 0255)    ED Course  I have reviewed the triage vital signs and the nursing notes.  Pertinent labs & imaging results that were available during my care of the patient were reviewed by me and considered in my medical decision making (see chart for details).  MDM Rules/Calculators/A&P Right inguinal pain of uncertain cause.  Exam is benign.  Anorexia of uncertain cause.  Chronic leg edema.  Old records are reviewed, and he had an ED visit on February 11 for right inguinal pain with negative work-up including negative CT of abdomen and pelvis.  Today, labs show moderate leukocytosis, evidence of acute kidney injury with creatinine 1.94 compared with 1.30 on 2/11.  Mild hyponatremia is present which is not felt to be clinically significant.  Albumin is low at 2.7, most likely secondary to poor nutrition.  We will check urinalysis and repeat CT scan.  He will be given IV fluids and will need to be admitted for his acute kidney injury.  Urinalysis is still pending.  CT shows small right inguinal hernia and small right hydrocele which are unchanged from prior, and probably not the cause of his pain.  Case is discussed with Dr. Toniann Fail of [facility], who agrees to admit the patient.  Final Clinical Impression(s) / ED Diagnoses Final diagnoses:  Acute kidney injury (nontraumatic) (HCC)  Right inguinal pain  Hyponatremia    Rx / DC Orders ED Discharge Orders    None       Dione Booze, MD 07/31/20 [phone]

## 2020-07-31 NOTE — ED Notes (Signed)
Iv team was unable to get iv, md unable to with ultrasound, other iv team consult entered.

## 2020-07-31 NOTE — ED Notes (Signed)
Patient transported to CT 

## 2020-07-31 NOTE — Progress Notes (Signed)
PT Cancellation Note  Patient Details Name: Ivan Parks MRN: [id number] DOB: 1941-03-01   Cancelled Treatment:    Reason Eval/Treat Not Completed: Other (comment) awaiting results of doppler for DVT r/o. Will continue to follow and attempt to return if time/schedule allow.    Madelaine Etienne, DPT, PN1   Supplemental Physical Therapist [facility]    Pager [phone] Acute Rehab Office [phone]

## 2020-07-31 NOTE — Consult Note (Signed)
Ivan Parks Jul 27, 1940  [id number].    Requesting MD: Dr. Jarvis Newcomer Chief Complaint/Reason for Consult: Left buttock induration  HPI: Ivan Parks is a 80 y.o. male with a hx of DM2, PVD, HTN, HLD, COPD and CHF who presented to the ED with failure to thrive.   Patient currently denies any problems besides occasional itching of his left buttock where he has the area of firmness. He is not sure how long this has been presented. He denies any drainage from this area. No associated fever, chills, n/v, abdominal pain or pain over inguinal hernia. He reports that he did not eat much for days leading up to admission but did eat today. He reports that he lives at home alone but notes report he lives at home with family. We were called to evaluate left buttock wound/induration. He underwent MRI pelvis and CT renal study that did not show any fluid collection below the area of induration.   ROS: Review of Systems  Constitutional: Negative for chills and fever.  Respiratory: Negative for cough and shortness of breath.   Cardiovascular: Positive for leg swelling. Negative for chest pain.  Gastrointestinal: Negative for abdominal pain, diarrhea, nausea and vomiting.  Genitourinary: Negative for dysuria.  All other systems reviewed and are negative.   Family History  Problem Relation Age of Onset  . Diabetes Mother   . Hypertension Mother   . Peripheral vascular disease Mother        amputation  . Diabetes Father        Right Leg Amputation-Gangrene  . Hypertension Father   . Peripheral vascular disease Father        amputation  . Cancer Father        Lead Poison-Ca  . Pneumonia Father   . Diabetes Sister   . Hypertension Sister   . Diabetes Daughter   . Hypertension Daughter     Past Medical History:  Diagnosis Date  . Acute respiratory failure (HCC)   . AKI (acute kidney injury) (HCC) 04/01/2016  . Bilateral lower extremity edema   . Bronchitis   . Chest pain 12/02/2017  .  CHF (congestive heart failure) (HCC)   . Chronic respiratory failure (HCC) 11/26/2014  . COPD (chronic obstructive pulmonary disease) (HCC)   . 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 Dementia (HCC)   . Diabetes (HCC) 05/02/2016  . DM (diabetes mellitus) (HCC)   . Essential hypertension 03/06/2015   hypertension   . HTN (hypertension)   . Hyperlipidemia   . Hypotension 03/31/2016  . Hypoxemia   . Leg edema, right- Greater than Left leg 07/05/2014   Lower extremity edema   . OSA (obstructive sleep apnea)   . Peripheral vascular disease (HCC)   . Tobacco abuse     Past Surgical History:  Procedure Laterality Date  . COLON SURGERY      Social History:  reports that he quit smoking about 9 years ago. His smoking use included cigarettes. He has a 75.00 pack-year smoking history. He has never used smokeless tobacco. He reports that he does not drink alcohol and does not use drugs.  Allergies: No Known Allergies  Medications Prior to Admission  Medication Sig Dispense Refill  . acetaminophen (TYLENOL) 325 MG tablet Take 2 tablets (650 mg total) by mouth  every 6 (six) hours as needed for mild pain (or Fever >/= 101).    Marland Kitchen albuterol (PROVENTIL) (2.5 MG/3ML) 0.083% nebulizer solution TAKE 3 MLS (2.5 MG TOTAL) BY NEBULIZATION EVERY 6 (SIX) HOURS AS NEEDED FOR WHEEZING OR SHORTNESS OF BREATH. 375 mL 11  . albuterol (VENTOLIN HFA) 108 (90 Base) MCG/ACT inhaler Inhale 2 puffs into the lungs every 6 (six) hours as needed for wheezing or shortness of breath.    Marland Kitchen atorvastatin (LIPITOR) 20 MG tablet Take 1 tablet (20 mg total) by mouth daily at 6 PM. (Patient taking differently: Take 20 mg by mouth daily. ) 60 tablet 0  . carvedilol (COREG) 12.5 MG tablet Take 1 tablet (12.5 mg total) by mouth 2 (two) times daily with a meal. 60 tablet 0  . donepezil  (ARICEPT) 5 MG tablet Take 5 mg by mouth at bedtime.    . furosemide (LASIX) 40 MG tablet Take 1 tablet (40 mg total) by mouth daily. Please make appt for future refills. 90 tablet 0  . insulin degludec (TRESIBA FLEXTOUCH) 100 UNIT/ML FlexTouch Pen Inject 0.15 mLs (15 Units total) into the skin daily. 15 mL 5  . Insulin Pen Needle (PEN NEEDLES) 31G X 6 MM MISC 1 each by Does not apply route daily. E11.9 100 each 11  . lisinopril (ZESTRIL) 20 MG tablet Take 20 mg by mouth daily.    . OXYGEN Inhale 2.5 L into the lungs continuous.    . pantoprazole (PROTONIX) 40 MG tablet Take 1 tablet (40 mg total) by mouth daily. 90 tablet 3  . predniSONE (DELTASONE) 10 MG tablet Take 1 tablet (10 mg total) by mouth daily with breakfast. 30 tablet 0  . Tiotropium Bromide-Olodaterol (STIOLTO RESPIMAT) 2.5-2.5 MCG/ACT AERS USE 2 PUFFS ONCE A DAY (Patient taking differently: Inhale 2 puffs into the lungs daily. ) 4 g 5  . vitamin B-12 (CYANOCOBALAMIN) 1000 MCG tablet Take 1,000 mcg by mouth daily.       Physical Exam: Blood pressure (!) 114/59, pulse 92, temperature 99 F (37.2 C), temperature source Oral, resp. rate 20, height  (1.651 m), weight 71.2 kg, SpO2 94 %. General: pleasant, WD/WN AA male who is laying in bed in NAD HEENT: head is normocephalic, atraumatic.  Sclera are noninjected.  PERRL.  Ears and nose without any masses or lesions.  Mouth is pink and moist. Dentition fair Heart: regular, rate, and rhythm.  No obvious murmur.  Palpable pedal pulses bilaterally  Lungs: CTAB, no wheezes, rhonchi, or rales noted.  Respiratory effort nonlabored Abd: Soft, ND, NT, +BS, no masses, or organomegaly, right inguinal hernia palpable when patient coughs and already reduced. NT on exam.  MS: trace bilateral LE edema. Moves all extremities Psych: A&Ox3 (not orientated to time) with an appropriate affect Neuro: cranial nerves grossly intact, grossly equal strength in BUE/BLE bilaterally, normal speech, thought  process intact, gait not assessed Skin: Left buttock wound as seen below. There is a centralized area of eschar with surrounding erythema and induration. No underlying fluctuance. No drainage. Skin is otherwise warm and dry with no masses, lesions, or rashes      Results for orders placed or performed during the hospital encounter of Aug 11, 2020 (from the past 48 hour(s))  CBC     Status: Abnormal   Collection Time: August 11, 2020  9:51 PM  Result Value Ref Range   WBC 15.7 (H) 4.0 - 10.5 K/uL   RBC 4.87 4.22 - 5.81 MIL/uL   Hemoglobin 13.2 13.0 - 17.0  g/dL   HCT 14.7 82.9 - 56.2 %   MCV 91.0 80.0 - 100.0 fL   MCH 27.1 26.0 - 34.0 pg   MCHC 29.8 (L) 30.0 - 36.0 g/dL   RDW 13.0 86.5 - 78.4 %   Platelets 227 150 - 400 K/uL   nRBC 0.0 0.0 - 0.2 %    Comment: Performed at [facility], [address] [place], [place] [address]  Comprehensive metabolic panel     Status: Abnormal   Collection Time: 07/29/2020  9:51 PM  Result Value Ref Range   Sodium 134 (L) 135 - 145 mmol/L   Potassium 3.9 3.5 - 5.1 mmol/L   Chloride 96 (L) 98 - 111 mmol/L   CO2 28 22 - 32 mmol/L   Glucose, Bld 209 (H) 70 - 99 mg/dL    Comment: Glucose reference range applies only to samples taken after fasting for at least 8 hours.   BUN 24 (H) 8 - 23 mg/dL   Creatinine, Ser 5.28 (H) 0.61 - 1.24 mg/dL   Calcium 8.7 (L) 8.9 - 10.3 mg/dL   Total Protein 7.7 6.5 - 8.1 g/dL   Albumin 2.7 (L) 3.5 - 5.0 g/dL   AST 20 15 - 41 U/L   ALT 11 0 - 44 U/L   Alkaline Phosphatase 73 38 - 126 U/L   Total Bilirubin 1.0 0.3 - 1.2 mg/dL   GFR, Estimated 34 (L) >60 mL/min    Comment: (NOTE) Calculated using the CKD-EPI Creatinine Equation (2021)    Anion gap 10 5 - 15    Comment: Performed at [facility], [address], [place], [place] [address]  SARS CORONAVIRUS 2 (TAT 6-24 HRS) Nasopharyngeal Nasopharyngeal Swab     Status: None   Collection Time: 07/31/20  3:01 AM   Specimen: Nasopharyngeal Swab  Result Value Ref  Range   SARS Coronavirus 2 NEGATIVE NEGATIVE    Comment: (NOTE) SARS-CoV-2 target nucleic acids are NOT DETECTED.  The SARS-CoV-2 RNA is generally detectable in upper and lower respiratory specimens during the acute phase of infection. Negative results do not preclude SARS-CoV-2 infection, do not rule out co-infections with other pathogens, and should not be used as the sole basis for treatment or other patient management decisions. Negative results must be combined with clinical observations, patient history, and epidemiological information. The expected result is Negative.  Fact Sheet for Patients: HairSlick.no  Fact Sheet for Healthcare Providers: quierodirigir.com  This test is not yet approved or cleared by the [place] FDA and  has been authorized for detection and/or diagnosis of SARS-CoV-2 by FDA under an Emergency Use Authorization (EUA). This EUA will remain  in effect (meaning this test can be used) for the duration of the COVID-19 declaration under Se ction 564(b)(1) of the Act, 21 U.S.C. section 360bbb-3(b)(1), unless the authorization is terminated or revoked sooner.  Performed at [facility], [address], [place], [place] [address]   Glucose, capillary     Status: Abnormal   Collection Time: 07/31/20  5:28 AM  Result Value Ref Range   Glucose-Capillary 145 (H) 70 - 99 mg/dL    Comment: Glucose reference range applies only to samples taken after fasting for at least 8 hours.  Comprehensive metabolic panel     Status: Abnormal   Collection Time: 07/31/20  5:50 AM  Result Value Ref Range   Sodium 134 (L) 135 - 145 mmol/L   Potassium 3.6 3.5 - 5.1 mmol/L  Chloride 97 (L) 98 - 111 mmol/L   CO2 27 22 - 32 mmol/L   Glucose, Bld 154 (H) 70 - 99 mg/dL    Comment: Glucose reference range applies only to samples taken after fasting for at least 8 hours.   BUN 21 8 - 23 mg/dL   Creatinine, Ser 2.68  (H) 0.61 - 1.24 mg/dL   Calcium 8.2 (L) 8.9 - 10.3 mg/dL   Total Protein 6.8 6.5 - 8.1 g/dL   Albumin 2.3 (L) 3.5 - 5.0 g/dL   AST 19 15 - 41 U/L   ALT 11 0 - 44 U/L   Alkaline Phosphatase 60 38 - 126 U/L   Total Bilirubin 1.0 0.3 - 1.2 mg/dL   GFR, Estimated 42 (L) >60 mL/min    Comment: (NOTE) Calculated using the CKD-EPI Creatinine Equation (2021)    Anion gap 10 5 - 15    Comment: Performed at [facility], [address], [place], [place] [address]  CBC WITH DIFFERENTIAL     Status: Abnormal   Collection Time: 07/31/20  5:50 AM  Result Value Ref Range   WBC 15.7 (H) 4.0 - 10.5 K/uL   RBC 4.57 4.22 - 5.81 MIL/uL   Hemoglobin 12.5 (L) 13.0 - 17.0 g/dL   HCT 22.2 97.9 - 89.2 %   MCV 89.1 80.0 - 100.0 fL   MCH 27.4 26.0 - 34.0 pg   MCHC 30.7 30.0 - 36.0 g/dL   RDW 11.9 41.7 - 40.8 %   Platelets 190 150 - 400 K/uL   nRBC 0.0 0.0 - 0.2 %   Neutrophils Relative % 83 %   Neutro Abs 13.0 (H) 1.7 - 7.7 K/uL   Lymphocytes Relative 6 %   Lymphs Abs 0.9 0.7 - 4.0 K/uL   Monocytes Relative 11 %   Monocytes Absolute 1.6 (H) 0.1 - 1.0 K/uL   Eosinophils Relative 0 %   Eosinophils Absolute 0.1 0.0 - 0.5 K/uL   Basophils Relative 0 %   Basophils Absolute 0.0 0.0 - 0.1 K/uL   Immature Granulocytes 0 %   Abs Immature Granulocytes 0.07 0.00 - 0.07 K/uL    Comment: Performed at [facility], [address], [place], [place] [address]   MR PELVIS WO CONTRAST  Result Date: 07/31/2020 CLINICAL DATA:  Right groin pain. EXAM: MRI PELVIS WITHOUT CONTRAST TECHNIQUE: Multiplanar multisequence MR imaging of the pelvis was performed. No intravenous contrast was administered. COMPARISON:  CT abdomen pelvis from same day. FINDINGS: Limited study due to motion artifact. Urinary Tract:  No abnormality visualized. Bowel:  Unremarkable visualized pelvic bowel loops. Vascular/Lymphatic: Unchanged mildly enlarged left inguinal lymph node measuring 1.2 cm in short axis, likely reactive. No  significant vascular findings. Reproductive: The prostate gland is unremarkable. Bilateral hydroceles again noted. Other:  Unchanged small fat containing right inguinal hernia. Musculoskeletal: No marrow signal abnormality. Small amount of fluid in the right iliopsoas bursa (series 7, image 29). Mild bilateral hip osteoarthritis. Full-thickness left anterior superior labral tear (series 4, image 70). IMPRESSION: 1. Limited study.  Mild right iliopsoas bursitis. 2. Mild bilateral hip osteoarthritis. 3. Full-thickness left anterior superior labral tear. 4. Unchanged small fat containing right inguinal hernia. Electronically Signed   By: Obie Dredge M.D.   On: 07/31/2020 10:06   DG Chest Port 1 View  Result Date: 07/31/2020 CLINICAL DATA:  Weakness EXAM: PORTABLE CHEST 1 VIEW COMPARISON:  04/07/2019 FINDINGS: The patient's chin obscures the apices. No focal consolidation or pleural  effusion. Stable cardiomediastinal silhouette. No pneumothorax. IMPRESSION: No active disease. Electronically Signed   By: Jasmine PangKim  Fujinaga M.D.   On: 07/31/2020 00:43   CT Renal Stone Study  Result Date: 07/31/2020 CLINICAL DATA:  Inguinal pain, leg weakness for 1 month EXAM: CT ABDOMEN AND PELVIS WITHOUT CONTRAST TECHNIQUE: Multidetector CT imaging of the abdomen and pelvis was performed following the standard protocol without IV contrast. COMPARISON:  06/28/2020 FINDINGS: Lower chest: No acute pleural or parenchymal lung disease. Unenhanced CT was performed per clinician order. Lack of IV contrast limits sensitivity and specificity, especially for evaluation of abdominal/pelvic solid viscera. Hepatobiliary: No focal liver abnormality is seen. No gallstones, gallbladder wall thickening, or biliary dilatation. Pancreas: Unremarkable. No pancreatic ductal dilatation or surrounding inflammatory changes. Spleen: Normal in size without focal abnormality. Adrenals/Urinary Tract: No urinary tract calculi or obstructive uropathy. The  adrenals and bladder are stable, with no acute findings. Stomach/Bowel: No bowel obstruction or ileus. Normal appendix right lower quadrant. No bowel wall thickening or inflammatory change. Vascular/Lymphatic: Aortic atherosclerosis. No enlarged abdominal or pelvic lymph nodes. Reproductive: Prostate is unremarkable. Partial visualization of right hydrocele again noted. Other: No free fluid or free gas. Small fat containing right inguinal hernia again noted. No bowel herniation. Musculoskeletal: No acute or destructive bony lesions. Reconstructed images demonstrate no additional findings. IMPRESSION: 1. Stable small fat containing right inguinal hernia. 2. Right hydrocele again noted, incompletely evaluated on this exam. 3. No evidence of urinary tract calculi or obstructive uropathy. 4.  Aortic Atherosclerosis (ICD10-I70.0). Electronically Signed   By: Sharlet SalinaMichael  Brown M.D.   On: 07/31/2020 00:42   US SCROTUM W/DOPPLER  Result Date: 07/31/2020 CLINICAL DATA:  Scrotal pain. EXAM: SCROTAL ULTRASOUND DOPPLER ULTRASOUND OF THE TESTICLES TECHNIQUE: Complete ultrasound examination of the testicles, epididymis, and other scrotal structures was performed. Color and spectral Doppler ultrasound were also utilized to evaluate blood flow to the testicles. COMPARISON:  CT renal 07/31/2020 FINDINGS: Right testicle Measurements: 3.2 x 2.1 x 2.7 cm. No mass or microlithiasis visualized. Left testicle Measurements: 3.5 x 1.8 x 2.7 cm. No mass or microlithiasis visualized. Right epididymis:  Normal in size and appearance. Left epididymis:  Normal in size and appearance. Hydrocele:  Bilateral hydroceles. Varicocele:  None visualized. Pulsed Doppler interrogation of both testes demonstrates normal low resistance arterial and venous waveforms bilaterally. IMPRESSION: 1. Bilateral hydroceles. 2. Otherwise unremarkable scrotal ultrasound. Electronically Signed   By: Tish FredericksonMorgane  Naveau M.D.   On: 07/31/2020 04:45   VAS US LOWER EXTREMITY  VENOUS (DVT)  Result Date: 07/31/2020  Lower Venous DVT Study Indications: Edema.  Risk Factors: CHF, chronic LE edema, known PVD. Limitations: Patient uncooperative with exam - movement. Comparison Study: No previous venous duplex exams Performing Technologist: Ernestene MentionJody Hill  Examination Guidelines: A complete evaluation includes B-mode imaging, spectral Doppler, color Doppler, and power Doppler as needed of all accessible portions of each vessel. Bilateral testing is considered an integral part of a complete examination. Limited examinations for reoccurring indications may be performed as noted. The reflux portion of the exam is performed with the patient in reverse Trendelenburg.  +---------+---------------+---------+-----------+----------+--------------+ RIGHT    CompressibilityPhasicitySpontaneityPropertiesThrombus Aging +---------+---------------+---------+-----------+----------+--------------+ CFV      Full           Yes      Yes                                 +---------+---------------+---------+-----------+----------+--------------+ SFJ  Full                                                        +---------+---------------+---------+-----------+----------+--------------+ FV Prox  Full           Yes      Yes                                 +---------+---------------+---------+-----------+----------+--------------+ FV Mid   Full           Yes      Yes                                 +---------+---------------+---------+-----------+----------+--------------+ FV DistalFull           Yes      Yes                                 +---------+---------------+---------+-----------+----------+--------------+ PFV      Full                                                        +---------+---------------+---------+-----------+----------+--------------+ POP      Full           Yes      Yes                                  +---------+---------------+---------+-----------+----------+--------------+ PTV      Full                                                        +---------+---------------+---------+-----------+----------+--------------+ PERO     Full                                                        +---------+---------------+---------+-----------+----------+--------------+   +---------+---------------+---------+-----------+----------+--------------+ LEFT     CompressibilityPhasicitySpontaneityPropertiesThrombus Aging +---------+---------------+---------+-----------+----------+--------------+ CFV      Full           Yes      Yes                                 +---------+---------------+---------+-----------+----------+--------------+ SFJ      Full                                                        +---------+---------------+---------+-----------+----------+--------------+ FV Prox  Full  Yes      Yes                                 +---------+---------------+---------+-----------+----------+--------------+ FV Mid   Full           Yes      Yes                                 +---------+---------------+---------+-----------+----------+--------------+ FV DistalFull           Yes      Yes                                 +---------+---------------+---------+-----------+----------+--------------+ PFV      Full                                                        +---------+---------------+---------+-----------+----------+--------------+ POP      Full           Yes      Yes                                 +---------+---------------+---------+-----------+----------+--------------+ PTV      Full                                                        +---------+---------------+---------+-----------+----------+--------------+ PERO     Full                                                         +---------+---------------+---------+-----------+----------+--------------+     Summary: BILATERAL: - No evidence of deep vein thrombosis seen in the lower extremities, bilaterally. - No evidence of superficial venous thrombosis in the lower extremities, bilaterally. -No evidence of popliteal cyst, bilaterally.   *See table(s) above for measurements and observations.    Preliminary    Anti-infectives (From admission, onward)   Start     Dose/Rate Route Frequency Ordered Stop   07/31/20 1545  ceFAZolin (ANCEF) IVPB 2g/100 mL premix        2 g 200 mL/hr over 30 Minutes Intravenous Every 8 hours 07/31/20 1524         Assessment/Plan DM2 PVD HTN HLD COPD  CHF Right inguinal hernia - fat containing on CT. Reduced and NT on exam.   Left buttock wound/cellulitis - Patient with area of cellulitis on the left buttock with centralized area of eschar. There is no underlying fluctuance and no fluid noted on CT or MRI. No indication for surgical debridement/drainage at this time. Continue abx. Please call back if we can be of any further assistance in the future.   Jacinto Halim, [facility] 07/31/2020, 3:22 PM Please see Amion for pager number during day hours  7:00am-4:30pm

## 2020-07-31 NOTE — Progress Notes (Signed)
PROGRESS NOTE  Brief Narrative: Ivan Parks is an 80 y.o. male with a history of T2DM, chronic HFpEF, venous insufficiency, stage IIIb CKD, 2L O2-dependent COPD, cognitive deficits, and right inguinal hernia who presented to the ED for the 2nd time in the past month with right groin pain, poor appetite. He demonstrated a leukocytosis to 15.7k and AKI with creatinine of 1.9 from baseline of 1.3. Hydrocele seen on scrotum U/S without torsion. CT renal stone study without acute abnormality to explain pain. Exam benign. Patient was admitted with pelvic U/S pending this AM. Left buttock wound is noted with induration for which ancef is started. Surgery consulted.   Subjective: Can't recall exactly why he came to the hospital. Has no pain currently. Has been refusing vitals and therapy evaluation. States the wound on his left buttock started after a long car trip a couple weeks ago.   Objective: BP (!) 114/59 (BP Location: Right Arm)   Pulse 92   Temp 99 F (37.2 C) (Oral)   Resp 20   Ht 5\' 5"  (1.651 m)   Wt 71.2 kg   SpO2 94%   BMI 26.13 kg/m   Gen: Elderly male in no distress Pulm: Clear and nonlabored, diminished. He's on room air currently.  CV: RRR, no murmur, no JVD, + R>L LE edema GI: Soft, NT, ND, +BS. Easily reduced right inguinal hernia.  Neuro: Alert and oriented. No focal deficits. Skin: Left buttock with annular hyperpigmented induration with central slough. Tender without fluctuance.   Assessment & Plan: Principal Problem:   Abdominal pain Active Problems:   COPD, severe (HCC)   Leg edema, right- Greater than Left leg   Essential hypertension   Acute kidney injury (nontraumatic) (HCC)   CHF (congestive heart failure) (HCC)   Controlled type 2 diabetes mellitus with hyperglycemia (HCC)   Pressure injury of skin  Hydrocele, right groin pain, right inguinal hernia: Hernia is reducible, though may be contributing to some mild discomfort.  - Supportive care  Cellulitis,  left buttock wound: No fluctuance on palpation.  - Appreciate evaluation by surgery regarding need for debridement. ?if U/S needed. No abscess noted on pelvic MRI though may not have captured the area as it is relatively lateral.  - Start ancef. - Continue local wound care per surgery/WOC.  - Monitor blood culture data  AKI on stage IIIb CKD: Improving. - Hold ACE and diuretic for now.   Chronic HFpEF:  - Hold lasix, ACEi with AKI  Venous insufficiency:  - R/o DVT w/U/S.   COPD: Severe.  - Continue supplemental oxygen, does not appear to be in exacerbation.   HTN: Continue coreg.   Cognitive impairment, debility:  - Would appreciate PT/OT evaluations. Has assistance with wife, son, step son and daughter.  - Continue aricept  T2DM: Continue insulin as ordered.   , MD Pager on amion 07/31/2020, 2:48 PM

## 2020-07-31 NOTE — ED Notes (Signed)
Report attempted, informed rn will call back.

## 2020-07-31 NOTE — Progress Notes (Signed)
This RN called and received report from [employer].

## 2020-07-31 NOTE — Consult Note (Signed)
WOC Nurse Consult Note: Attempted to see patient in [facility] [phone]. Patient in MRI. Will attempt later. Helmut Muster, RN, MSN, CWOCN, CNS-BC, pager [phone]

## 2020-07-31 NOTE — ED Notes (Signed)
Patient transported to Ultrasound 

## 2020-07-31 NOTE — TOC Initial Note (Signed)
Transition of Care [facility]) - Initial/Assessment Note    Patient Details  Name: Ivan Parks MRN: [id number] Date of Birth: 1941/04/11  Transition of Care [facility]) CM/SW Contact:    Kermit Balo, RN Phone Number: 07/31/2020, 3:02 PM  Clinical Narrative:                 Patient lives at home with his spouse, son and step son. He says he has plenty of assistance at home.  Pt denies issues with home medications.  His step daughter provides needed transportation.  If pt requires dressing changes at d/c he states his step daughter would be able to do this at home. Pt has oxygen at home at 2.5 L but is unsure of the DME company.  TOC following and awaiting therapy eval.   Expected Discharge Plan: Home/Self Care Barriers to Discharge: Continued Medical Work up   Patient Goals and CMS Choice        Expected Discharge Plan and Services Expected Discharge Plan: Home/Self Care       Living arrangements for the past 2 months: Single Family Home                                      Prior Living Arrangements/Services Living arrangements for the past 2 months: Single Family Home Lives with:: Spouse,Adult Children Patient language and need for interpreter reviewed:: Yes Do you feel safe going back to the place where you live?: Yes      Need for Family Participation in Patient Care: Yes (Comment) Care giver support system in place?: Yes (comment) Current home services: DME (oxygen at 2.5 L/ walker/ seat in shower) Criminal Activity/Legal Involvement Pertinent to Current Situation/Hospitalization: No - Comment as needed  Activities of Daily Living      Permission Sought/Granted                  Emotional Assessment Appearance:: Appears stated age Attitude/Demeanor/Rapport: Engaged Affect (typically observed): Accepting Orientation: : Oriented to Self,Oriented to Place,Oriented to  Time,Oriented to Situation   Psych Involvement: No (comment)  Admission  diagnosis:  Hyponatremia [E87.1] Pain [R52] Acute kidney injury (nontraumatic) (HCC) [N17.9] Right inguinal pain [R10.31] Abdominal pain [R10.9] Patient Active Problem List   Diagnosis Date Noted  . Abdominal pain 07/31/2020  . Controlled type 2 diabetes mellitus with hyperglycemia (HCC) 07/31/2020  . Pressure injury of skin 07/31/2020  . Infection 11/15/2018  . 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 Chest pain 12/02/2017  . Peripheral vascular disease (HCC)   . Hyperlipidemia   . CHF (congestive heart failure) (HCC)   . Diabetes (HCC) 05/02/2016  . Acute kidney injury (nontraumatic) (HCC) 04/01/2016  . Hypotension 03/31/2016  . Essential hypertension 03/06/2015  . Chronic respiratory failure (HCC) 11/26/2014  . Leg edema, right- Greater than Left leg 07/05/2014  . Allergic rhinitis 11/09/2012  . COPD, severe (HCC) 11/19/2010   PCP:  Laurann Montana, MD Pharmacy:   [facility] [address], [place] - [address] [place] [address] Phone: [phone] Fax: [phone]  [facility] - [place], [place] - [address] [place] [address] Phone: [phone] Fax: [phone]     Social Determinants of Health (SDOH) Interventions    Readmission Risk Interventions No flowsheet data found.

## 2020-07-31 NOTE — ED Notes (Signed)
Pt medications are delayed due to iv access not obtained, iv team in pts room now

## 2020-07-31 NOTE — ED Notes (Signed)
Pt provided with urinal, informed pt urine specimen needed, pt denies having to urinate

## 2020-07-31 NOTE — Progress Notes (Signed)
Pharmacy Antibiotic Note  Ivan Parks is a 80 y.o. male with DM type 2 admitted on 08/14/2020 with R groin pain and leukocytosis.  Pharmacy has been consulted for cefazolin dosing for cellulitis.  WBC 15.7, Tmax 99 F; Scr 1.64, CrCl 31.3 ml/min  Plan: Cefazolin 2 gm IV Q 8 hrs  Monitor renal function closely, as dose will need to be reduced to cefazolin 2 gm IV Q 12 hrs if CrCl falls <30 ml/min Monitor WBC, temp, clinical improvement, cultures  Height: 5\' 5"  (165.1 cm) Weight: 71.2 kg (157 lb) IBW/kg (Calculated) : 61.5  Temp (24hrs), Avg:99.2 F (37.3 C), Min:98.4 F (36.9 C), Max:100.1 F (37.8 C)  Recent Labs  Lab 07/16/2020 2151 07/31/20 0550  WBC 15.7* 15.7*  CREATININE 1.94* 1.64*    Estimated Creatinine Clearance: 31.3 mL/min (A) (by C-G formula based on SCr of 1.64 mg/dL (H)).    No Known Allergies  Antimicrobials this admission: 3/16 Cefazolin >>  Microbiology results: 3/16 BCx X 2: pending 3/16 COVID: negative  Thank you for allowing pharmacy to be a part of this patient's care.  4/16, PharmD, BCPS, [facility] Clinical Pharmacist 07/31/2020 3:17 PM

## 2020-07-31 NOTE — Consult Note (Addendum)
WOC Nurse Consult Note: Patient receiving care in [facility] [phone]. Reason for Consult: pressure sore left buttock Wound type: Patient states the sore area has been there at least 2 weeks; perhaps longer. Etiology undetermined at this time. Pressure Injury POA: Yes/No/NA Measurement: the yellow/tan center measures 1.2 cm x 1.2 cm.  Surrounding it is a hard, indurated area that measures 7 cm x 8 cm and it is erythematous and very tender to touch Wound bed: Drainage (amount, consistency, odor) none Periwound: Dressing procedure/placement/frequency: there is a foam dressing over the area.  This is fine for now.  I suspect this may be an abscess.  I have sent Dr. Aubery Lapping a Secure Chat asking him to see this note.  I recommend a surgical consult. The Secure Chat message was seen and responded to by the MD at 10:46 a.m. Discussed plan of care with the patient and bedside nurse.  WOC nurse will not follow at this time.  Please re-consult the WOC team if needed.  Helmut Muster, RN, MSN, CWOCN, CNS-BC, pager [phone]

## 2020-07-31 NOTE — Care Management Obs Status (Signed)
MEDICARE OBSERVATION STATUS NOTIFICATION   Patient Details  Name: Ivan Parks MRN: [id number] Date of Birth: [birth year]   Medicare Observation Status Notification Given:  Yes    Kermit Balo, RN 07/31/2020, 1:58 PM

## 2020-07-31 NOTE — ED Provider Notes (Signed)
Angiocath insertion Performed by: Dierdre Forth  Consent: Verbal consent obtained. Risks and benefits: risks, benefits and alternatives were discussed Time out: Immediately prior to procedure a "time out" was called to verify the correct patient, procedure, equipment, support staff and site/side marked as required.  Preparation: Patient was prepped and draped in the usual sterile fashion.  Vein Location: right hand  Gauge: 22ga  Normal blood return and flush without difficulty  Patient tolerance: Patient tolerated the procedure well with no immediate complications.      Ivan Parks, Boyd Kerbs 07/31/20 0255    Dione Booze, MD 07/31/20 [phone]

## 2020-08-01 ENCOUNTER — Inpatient Hospital Stay (HOSPITAL_COMMUNITY): Payer: HMO

## 2020-08-01 DIAGNOSIS — R6520 Severe sepsis without septic shock: Secondary | ICD-10-CM | POA: Diagnosis present

## 2020-08-01 DIAGNOSIS — J449 Chronic obstructive pulmonary disease, unspecified: Secondary | ICD-10-CM | POA: Diagnosis present

## 2020-08-01 DIAGNOSIS — N179 Acute kidney failure, unspecified: Secondary | ICD-10-CM | POA: Diagnosis present

## 2020-08-01 DIAGNOSIS — Z7952 Long term (current) use of systemic steroids: Secondary | ICD-10-CM | POA: Diagnosis not present

## 2020-08-01 DIAGNOSIS — R7881 Bacteremia: Secondary | ICD-10-CM | POA: Diagnosis not present

## 2020-08-01 DIAGNOSIS — L03317 Cellulitis of buttock: Secondary | ICD-10-CM | POA: Diagnosis present

## 2020-08-01 DIAGNOSIS — G9341 Metabolic encephalopathy: Secondary | ICD-10-CM | POA: Diagnosis present

## 2020-08-01 DIAGNOSIS — L89329 Pressure ulcer of left buttock, unspecified stage: Secondary | ICD-10-CM

## 2020-08-01 DIAGNOSIS — Z20822 Contact with and (suspected) exposure to covid-19: Secondary | ICD-10-CM | POA: Diagnosis present

## 2020-08-01 DIAGNOSIS — I361 Nonrheumatic tricuspid (valve) insufficiency: Secondary | ICD-10-CM | POA: Diagnosis not present

## 2020-08-01 DIAGNOSIS — I130 Hypertensive heart and chronic kidney disease with heart failure and stage 1 through stage 4 chronic kidney disease, or unspecified chronic kidney disease: Secondary | ICD-10-CM | POA: Diagnosis present

## 2020-08-01 DIAGNOSIS — E43 Unspecified severe protein-calorie malnutrition: Secondary | ICD-10-CM | POA: Diagnosis present

## 2020-08-01 DIAGNOSIS — Z515 Encounter for palliative care: Secondary | ICD-10-CM | POA: Diagnosis not present

## 2020-08-01 DIAGNOSIS — E1165 Type 2 diabetes mellitus with hyperglycemia: Secondary | ICD-10-CM | POA: Diagnosis not present

## 2020-08-01 DIAGNOSIS — I5033 Acute on chronic diastolic (congestive) heart failure: Secondary | ICD-10-CM | POA: Diagnosis not present

## 2020-08-01 DIAGNOSIS — R638 Other symptoms and signs concerning food and fluid intake: Secondary | ICD-10-CM | POA: Diagnosis not present

## 2020-08-01 DIAGNOSIS — E871 Hypo-osmolality and hyponatremia: Secondary | ICD-10-CM | POA: Diagnosis present

## 2020-08-01 DIAGNOSIS — R627 Adult failure to thrive: Secondary | ICD-10-CM | POA: Diagnosis present

## 2020-08-01 DIAGNOSIS — Z66 Do not resuscitate: Secondary | ICD-10-CM | POA: Diagnosis not present

## 2020-08-01 DIAGNOSIS — R109 Unspecified abdominal pain: Secondary | ICD-10-CM | POA: Diagnosis not present

## 2020-08-01 DIAGNOSIS — I5032 Chronic diastolic (congestive) heart failure: Secondary | ICD-10-CM

## 2020-08-01 DIAGNOSIS — Z794 Long term (current) use of insulin: Secondary | ICD-10-CM | POA: Diagnosis not present

## 2020-08-01 DIAGNOSIS — A4102 Sepsis due to Methicillin resistant Staphylococcus aureus: Secondary | ICD-10-CM | POA: Diagnosis present

## 2020-08-01 DIAGNOSIS — Z833 Family history of diabetes mellitus: Secondary | ICD-10-CM | POA: Diagnosis not present

## 2020-08-01 DIAGNOSIS — Z9981 Dependence on supplemental oxygen: Secondary | ICD-10-CM | POA: Diagnosis not present

## 2020-08-01 DIAGNOSIS — Z7951 Long term (current) use of inhaled steroids: Secondary | ICD-10-CM | POA: Diagnosis not present

## 2020-08-01 DIAGNOSIS — F0391 Unspecified dementia with behavioral disturbance: Secondary | ICD-10-CM | POA: Diagnosis present

## 2020-08-01 DIAGNOSIS — I351 Nonrheumatic aortic (valve) insufficiency: Secondary | ICD-10-CM | POA: Diagnosis not present

## 2020-08-01 DIAGNOSIS — I10 Essential (primary) hypertension: Secondary | ICD-10-CM

## 2020-08-01 DIAGNOSIS — B9562 Methicillin resistant Staphylococcus aureus infection as the cause of diseases classified elsewhere: Secondary | ICD-10-CM | POA: Diagnosis not present

## 2020-08-01 DIAGNOSIS — E1122 Type 2 diabetes mellitus with diabetic chronic kidney disease: Secondary | ICD-10-CM | POA: Diagnosis present

## 2020-08-01 DIAGNOSIS — E1151 Type 2 diabetes mellitus with diabetic peripheral angiopathy without gangrene: Secondary | ICD-10-CM | POA: Diagnosis present

## 2020-08-01 DIAGNOSIS — J9610 Chronic respiratory failure, unspecified whether with hypoxia or hypercapnia: Secondary | ICD-10-CM | POA: Diagnosis present

## 2020-08-01 DIAGNOSIS — E785 Hyperlipidemia, unspecified: Secondary | ICD-10-CM | POA: Diagnosis present

## 2020-08-01 LAB — BLOOD CULTURE ID PANEL (REFLEXED) - BCID2

## 2020-08-01 LAB — GLUCOSE, CAPILLARY
Glucose-Capillary: 111 mg/dL — ABNORMAL HIGH (ref 70–99)
Glucose-Capillary: 143 mg/dL — ABNORMAL HIGH (ref 70–99)

## 2020-08-01 LAB — BASIC METABOLIC PANEL
Anion gap: 7 (ref 5–15)
BUN: 19 mg/dL (ref 8–23)
CO2: 32 mmol/L (ref 22–32)
Calcium: 8.3 mg/dL — ABNORMAL LOW (ref 8.9–10.3)
Chloride: 96 mmol/L — ABNORMAL LOW (ref 98–111)
Creatinine, Ser: 1.55 mg/dL — ABNORMAL HIGH (ref 0.61–1.24)
GFR, Estimated: 45 mL/min — ABNORMAL LOW (ref >60)
Glucose, Bld: 81 mg/dL (ref 70–99)
Potassium: 3.3 mmol/L — ABNORMAL LOW (ref 3.5–5.1)
Sodium: 135 mmol/L (ref 135–145)

## 2020-08-01 LAB — CBC
HCT: 37.5 % — ABNORMAL LOW (ref 39.0–52.0)
Hemoglobin: 11.4 g/dL — ABNORMAL LOW (ref 13.0–17.0)
MCH: 27.6 pg (ref 26.0–34.0)
MCHC: 30.4 g/dL (ref 30.0–36.0)
MCV: 90.8 fL (ref 80.0–100.0)
Platelets: 192 10*3/uL (ref 150–400)
RBC: 4.13 MIL/uL — ABNORMAL LOW (ref 4.22–5.81)
RDW: 12.4 % (ref 11.5–15.5)
WBC: 15.1 10*3/uL — ABNORMAL HIGH (ref 4.0–10.5)
nRBC: 0 % (ref 0.0–0.2)

## 2020-08-01 LAB — ECHOCARDIOGRAM COMPLETE
AV Mean grad: 6.4 mmHg
AV Peak grad: 11.9 mmHg
Ao pk vel: 1.73 m/s
Area-P 1/2: 4.06 cm2
Height: 65 in
P 1/2 time: 538 msec
S' Lateral: 3.7 cm
Weight: 2512 oz

## 2020-08-01 LAB — CK: Total CK: 340 U/L (ref 49–397)

## 2020-08-01 MED ORDER — ENOXAPARIN SODIUM 40 MG/0.4ML ~~LOC~~ SOLN
40.0000 mg | Freq: Every day | SUBCUTANEOUS | Status: DC
  Filled 2020-08-01: qty 0.4

## 2020-08-01 MED ORDER — INSULIN ASPART 100 UNIT/ML ~~LOC~~ SOLN: 0.0000 [IU] | Freq: Three times a day (TID) | SUBCUTANEOUS | Status: DC

## 2020-08-01 MED ORDER — POTASSIUM CHLORIDE CRYS ER 20 MEQ PO TBCR
40.0000 meq | EXTENDED_RELEASE_TABLET | Freq: Once | ORAL | Status: AC
  Administered 2020-08-01: 40 meq via ORAL
  Filled 2020-08-01: qty 2

## 2020-08-01 MED ORDER — SODIUM CHLORIDE 0.9 % IV SOLN
650.0000 mg | Freq: Every day | INTRAVENOUS | Status: DC
  Administered 2020-08-01: 650 mg via INTRAVENOUS
  Filled 2020-08-01 (×2): qty 13

## 2020-08-01 MED ORDER — FUROSEMIDE 80 MG PO TABS
80.0000 mg | ORAL_TABLET | Freq: Every day | ORAL | Status: DC
  Administered 2020-08-01: 80 mg via ORAL
  Filled 2020-08-01: qty 1

## 2020-08-01 MED ORDER — POLYETHYLENE GLYCOL 3350 17 G PO PACK
17.0000 g | PACK | Freq: Every day | ORAL | Status: DC
  Administered 2020-08-01 – 2020-08-09 (×4): 17 g via ORAL
  Filled 2020-08-01 (×8): qty 1

## 2020-08-01 NOTE — Progress Notes (Signed)
PROGRESS NOTE  Ivan Parks  QHU:[id number] DOB: 21-Jan-1941 DOA: 07/31/2020 PCP: Laurann Montana, MD   Brief Narrative: Ivan Parks is an 80 y.o. male with a history of T2DM, chronic HFpEF, venous insufficiency, stage IIIb CKD, 2L O2-dependent COPD, cognitive deficits, and right inguinal hernia who presented to the ED for the 2nd time in the past month with right groin pain, poor appetite. He demonstrated a leukocytosis to 15.7k and AKI with creatinine of 1.9 from baseline of 1.3. Hydrocele seen on scrotum U/S without torsion. CT renal stone study without acute abnormality to explain pain. Exam benign. Patient was admitted with pelvic U/S pending this AM. Left buttock wound was noted with induration and no abscess on exam or imaging for which surgery was consulted (no I&D recommended) and ancef was started. Blood culture has resulted with MRSA for which ID was consulted and antibiotic changed to daptomycin.   Assessment & Plan: Principal Problem:   Abdominal pain Active Problems:   COPD, severe (HCC)   Leg edema, right- Greater than Left leg   Essential hypertension   Acute kidney injury (nontraumatic) (HCC)   CHF (congestive heart failure) (HCC)   Controlled type 2 diabetes mellitus with hyperglycemia (HCC)   Pressure injury of skin   Gram-positive bacteremia  Hydrocele, right groin pain, right inguinal hernia: Hernia is reducible, though may be contributing to some mild discomfort.  - Supportive care  Cellulitis, left buttock wound, S. aureus bacteremia with + methicillin resistance: No fluctuance on palpation. No debridement recommended by surgery.  - Change ancef to daptomycin per ID. TTE ordered, repeat blood cultures ordered for 3/18 AM.  - Continue local wound care per surgery/WOC.  - Monitor blood culture data. Leukocytosis unchanged likely due to inadequate antimicrobial coverage.   AKI on stage IIIb CKD: Improving. Baseline SCr 1.3-1.5. 1.94 on admission.  - Hold ACE and  diuretic an additional day. Does not appear to be overloaded.   Chronic HFpEF:  - Hold lasix, ACEi with AKI as above. Insufficient po intake at this time, so will continue to hold.  Venous insufficiency: No DVT on U/S - Elevate legs.  COPD: Severe.  - Continue supplemental oxygen, does not appear to be in exacerbation.   HTN: Continue coreg.   Cognitive impairment, debility:  - PT/OT evaluations. Has assistance with wife, son, step son and daughter - Continue aricept  T2DM:  - Start CBGs AC/HS and SSI. Continue lantus 15u daily.  - Update HbA1c (last was 10.9%)  DVT prophylaxis: Lovenox Code Status: Full Family Communication: None at bedside Disposition Plan:  Status is: Inpatient  Remains inpatient appropriate because:Ongoing diagnostic testing needed not appropriate for outpatient work up, Unsafe d/c plan and IV treatments appropriate due to intensity of illness or inability to take PO   Dispo: The patient is from: Home              Anticipated d/c is to: PT/OT consults pending              Patient currently is not medically stable to d/c.   Difficult to place patient No  Consultants:   Surgery  ID  Procedures:   None  Antimicrobials:  Ancef 3/16 - 3/17  Daptomycin 3/17 >>   Subjective: Feels the pain causing him to come to the ED was related to his hernia which comes and goes and is currently gone. The area on his left buttock however has gotten more sore. No other complaints but feels somewhat weak and  isn't eating very much. Declined PT yesterday.   Objective: Vitals:   07/31/20 1641 07/31/20 2128 08/01/20 0529 08/01/20 1046  BP: 119/62 (!) 102/56 (!) 131/58 (!) 109/53  Pulse: 91 78 88 83  Resp: 18 16 17 17   Temp: 97.8 F (36.6 C) 99.3 F (37.4 C) 98.3 F (36.8 C) 98.2 F (36.8 C)  TempSrc: Oral     SpO2: 98% 97% 98% 100%  Weight:      Height:        Intake/Output Summary (Last 24 hours) at 08/01/2020 1340 Last data filed at 08/01/2020  0800 Gross per 24 hour  Intake 600 ml  Output 300 ml  Net 300 ml   Filed Weights   08/10/2020 2122  Weight: 71.2 kg    Gen: Chronically ill-appearing male in no distress Pulm: Non-labored breathing with nasal cannula on forehead. Clear to auscultation bilaterally.  CV: Regular rate and rhythm. No murmur, rub, or gallop. No JVD, no pedal edema. GI: Abdomen soft, non-tender, non-distended, with normoactive bowel sounds. No organomegaly or masses felt. Ext: Warm, no deformities Skin: Left buttock with focal induration without fluctuance. This is tender with stable eschar.  Neuro: Alert and oriented. No focal neurological deficits. Psych: Judgement and insight appear marginal, impaired. Mood & affect appropriate.   Data Reviewed: I have personally reviewed following labs and imaging studies  CBC: Recent Labs  Lab 07/28/2020 2151 07/31/20 0550 08/01/20 0251  WBC 15.7* 15.7* 15.1*  NEUTROABS  --  13.0*  --   HGB 13.2 12.5* 11.4*  HCT 44.3 40.7 37.5*  MCV 91.0 89.1 90.8  PLT 227 190 192   Basic Metabolic Panel: Recent Labs  Lab 07/29/2020 2151 07/31/20 0550 08/01/20 0251  NA 134* 134* 135  K 3.9 3.6 3.3*  CL 96* 97* 96*  CO2 28 27 32  GLUCOSE 209* 154* 81  BUN 24* 21 19  CREATININE 1.94* 1.64* 1.55*  CALCIUM 8.7* 8.2* 8.3*   GFR: Estimated Creatinine Clearance: 33.1 mL/min (A) (by C-G formula based on SCr of 1.55 mg/dL (H)). Liver Function Tests: Recent Labs  Lab 08/03/2020 2151 07/31/20 0550  AST 20 19  ALT 11 11  ALKPHOS 73 60  BILITOT 1.0 1.0  PROT 7.7 6.8  ALBUMIN 2.7* 2.3*   Cardiac Enzymes: Recent Labs  Lab 08/01/20 1026  CKTOTAL 340   Urine analysis:    Component Value Date/Time   COLORURINE YELLOW 07/31/2020 2033   APPEARANCEUR CLEAR 07/31/2020 2033   LABSPEC 1.015 07/31/2020 2033   PHURINE 5.0 07/31/2020 2033   GLUCOSEU NEGATIVE 07/31/2020 2033   HGBUR MODERATE (A) 07/31/2020 2033   BILIRUBINUR NEGATIVE 07/31/2020 2033   KETONESUR NEGATIVE  07/31/2020 2033   PROTEINUR 100 (A) 07/31/2020 2033   UROBILINOGEN 0.2 09/19/2011 1701   NITRITE NEGATIVE 07/31/2020 2033   LEUKOCYTESUR NEGATIVE 07/31/2020 2033   Recent Results (from the past 240 hour(s))  SARS CORONAVIRUS 2 (TAT 6-24 HRS) Nasopharyngeal Nasopharyngeal Swab     Status: None   Collection Time: 07/31/20  3:01 AM   Specimen: Nasopharyngeal Swab  Result Value Ref Range Status   SARS Coronavirus 2 NEGATIVE NEGATIVE Final    Comment: (NOTE) SARS-CoV-2 target nucleic acids are NOT DETECTED.  The SARS-CoV-2 RNA is generally detectable in upper and lower respiratory specimens during the acute phase of infection. Negative results do not preclude SARS-CoV-2 infection, do not rule out co-infections with other pathogens, and should not be used as the sole basis for treatment or other patient management decisions.  Negative results must be combined with clinical observations, patient history, and epidemiological information. The expected result is Negative.  Fact Sheet for Patients: HairSlick.nohttps://www.fda.gov/media/138098/download  Fact Sheet for Healthcare Providers: quierodirigir.comhttps://www.fda.gov/media/138095/download  This test is not yet approved or cleared by the [place] FDA and  has been authorized for detection and/or diagnosis of SARS-CoV-2 by FDA under an Emergency Use Authorization (EUA). This EUA will remain  in effect (meaning this test can be used) for the duration of the COVID-19 declaration under Se ction 564(b)(1) of the Act, 21 U.S.C. section 360bbb-3(b)(1), unless the authorization is terminated or revoked sooner.  Performed at [facility], [address] [place], [place] [address]   Culture, blood (routine x 2)     Status: None (Preliminary result)   Collection Time: 07/31/20  8:01 AM   Specimen: BLOOD LEFT HAND  Result Value Ref Range Status   Specimen Description BLOOD LEFT HAND  Final   Special Requests   Final    BOTTLES DRAWN AEROBIC ONLY Blood  Culture adequate volume   Culture  Setup Time   Final    AEROBIC BOTTLE ONLY GRAM POSITIVE COCCI CRITICAL RESULT CALLED TO, READ BACK BY AND VERIFIED WITH: Marissa NestleHARMD THOMAS J [id number] FCP Performed at [facility], [address] [place], [place] [address]    Culture GRAM POSITIVE COCCI  Final   Report Status PENDING  Incomplete  Blood Culture ID Panel (Reflexed)     Status: Abnormal   Collection Time: 07/31/20  8:01 AM  Result Value Ref Range Status   Enterococcus faecalis NOT DETECTED NOT DETECTED Final   Enterococcus Faecium NOT DETECTED NOT DETECTED Final   Listeria monocytogenes NOT DETECTED NOT DETECTED Final   Staphylococcus species DETECTED (A) NOT DETECTED Final    Comment: CRITICAL RESULT CALLED TO, READ BACK BY AND VERIFIED WITH: Marissa NestleHARMD THOMAS J [id number] FCP    Staphylococcus aureus (BCID) DETECTED (A) NOT DETECTED Final    Comment: Methicillin (oxacillin)-resistant Staphylococcus aureus (MRSA). MRSA is predictably resistant to beta-lactam antibiotics (except ceftaroline). Preferred therapy is vancomycin unless clinically contraindicated. Patient requires contact precautions if  hospitalized. CRITICAL RESULT CALLED TO, READ BACK BY AND VERIFIED WITH: PHARMD THOMAS J [id number] FCP    Staphylococcus epidermidis NOT DETECTED NOT DETECTED Final   Staphylococcus lugdunensis NOT DETECTED NOT DETECTED Final   Streptococcus species NOT DETECTED NOT DETECTED Final   Streptococcus agalactiae NOT DETECTED NOT DETECTED Final   Streptococcus pneumoniae NOT DETECTED NOT DETECTED Final   Streptococcus pyogenes NOT DETECTED NOT DETECTED Final   A.calcoaceticus-baumannii NOT DETECTED NOT DETECTED Final   Bacteroides fragilis NOT DETECTED NOT DETECTED Final   Enterobacterales NOT DETECTED NOT DETECTED Final   Enterobacter cloacae complex NOT DETECTED NOT DETECTED Final   Escherichia coli NOT DETECTED NOT DETECTED Final   Klebsiella aerogenes NOT DETECTED NOT DETECTED Final    Klebsiella oxytoca NOT DETECTED NOT DETECTED Final   Klebsiella pneumoniae NOT DETECTED NOT DETECTED Final   Proteus species NOT DETECTED NOT DETECTED Final   Salmonella species NOT DETECTED NOT DETECTED Final   Serratia marcescens NOT DETECTED NOT DETECTED Final   Haemophilus influenzae NOT DETECTED NOT DETECTED Final   Neisseria meningitidis NOT DETECTED NOT DETECTED Final   Pseudomonas aeruginosa NOT DETECTED NOT DETECTED Final   Stenotrophomonas maltophilia NOT DETECTED NOT DETECTED Final   Candida albicans NOT DETECTED NOT DETECTED Final   Candida auris NOT DETECTED NOT DETECTED Final   Candida glabrata NOT DETECTED NOT DETECTED  Final   Candida krusei NOT DETECTED NOT DETECTED Final   Candida parapsilosis NOT DETECTED NOT DETECTED Final   Candida tropicalis NOT DETECTED NOT DETECTED Final   Cryptococcus neoformans/gattii NOT DETECTED NOT DETECTED Final   Meth resistant mecA/C and MREJ DETECTED (A) NOT DETECTED Final    Comment: CRITICAL RESULT CALLED TO, READ BACK BY AND VERIFIED WITH: Marissa Nestle [id number] FCP Performed at [facility], [address] [place], [place] [address]   Culture, blood (routine x 2)     Status: None (Preliminary result)   Collection Time: 07/31/20  8:02 AM   Specimen: BLOOD  Result Value Ref Range Status   Specimen Description BLOOD LEFT ANTECUBITAL  Final   Special Requests   Final    BOTTLES DRAWN AEROBIC ONLY Blood Culture adequate volume   Culture   Final    NO GROWTH < 24 HOURS Performed at [facility], [address] [place], [place] [address]    Report Status PENDING  Incomplete      Radiology Studies: MR PELVIS WO CONTRAST  Result Date: 07/31/2020 CLINICAL DATA:  Right groin pain. EXAM: MRI PELVIS WITHOUT CONTRAST TECHNIQUE: Multiplanar multisequence MR imaging of the pelvis was performed. No intravenous contrast was administered. COMPARISON:  CT abdomen pelvis from same day. FINDINGS: Limited study due to motion  artifact. Urinary Tract:  No abnormality visualized. Bowel:  Unremarkable visualized pelvic bowel loops. Vascular/Lymphatic: Unchanged mildly enlarged left inguinal lymph node measuring 1.2 cm in short axis, likely reactive. No significant vascular findings. Reproductive: The prostate gland is unremarkable. Bilateral hydroceles again noted. Other:  Unchanged small fat containing right inguinal hernia. Musculoskeletal: No marrow signal abnormality. Small amount of fluid in the right iliopsoas bursa (series 7, image 29). Mild bilateral hip osteoarthritis. Full-thickness left anterior superior labral tear (series 4, image 70). IMPRESSION: 1. Limited study.  Mild right iliopsoas bursitis. 2. Mild bilateral hip osteoarthritis. 3. Full-thickness left anterior superior labral tear. 4. Unchanged small fat containing right inguinal hernia. Electronically Signed   By: Obie Dredge M.D.   On: 07/31/2020 10:06   DG Chest Port 1 View  Result Date: 07/31/2020 CLINICAL DATA:  Weakness EXAM: PORTABLE CHEST 1 VIEW COMPARISON:  04/07/2019 FINDINGS: The patient's chin obscures the apices. No focal consolidation or pleural effusion. Stable cardiomediastinal silhouette. No pneumothorax. IMPRESSION: No active disease. Electronically Signed   By: Jasmine Pang M.D.   On: 07/31/2020 00:43   CT Renal Stone Study  Result Date: 07/31/2020 CLINICAL DATA:  Inguinal pain, leg weakness for 1 month EXAM: CT ABDOMEN AND PELVIS WITHOUT CONTRAST TECHNIQUE: Multidetector CT imaging of the abdomen and pelvis was performed following the standard protocol without IV contrast. COMPARISON:  06/28/2020 FINDINGS: Lower chest: No acute pleural or parenchymal lung disease. Unenhanced CT was performed per clinician order. Lack of IV contrast limits sensitivity and specificity, especially for evaluation of abdominal/pelvic solid viscera. Hepatobiliary: No focal liver abnormality is seen. No gallstones, gallbladder wall thickening, or biliary  dilatation. Pancreas: Unremarkable. No pancreatic ductal dilatation or surrounding inflammatory changes. Spleen: Normal in size without focal abnormality. Adrenals/Urinary Tract: No urinary tract calculi or obstructive uropathy. The adrenals and bladder are stable, with no acute findings. Stomach/Bowel: No bowel obstruction or ileus. Normal appendix right lower quadrant. No bowel wall thickening or inflammatory change. Vascular/Lymphatic: Aortic atherosclerosis. No enlarged abdominal or pelvic lymph nodes. Reproductive: Prostate is unremarkable. Partial visualization of right hydrocele again noted. Other: No free fluid or free gas. Small fat  containing right inguinal hernia again noted. No bowel herniation. Musculoskeletal: No acute or destructive bony lesions. Reconstructed images demonstrate no additional findings. IMPRESSION: 1. Stable small fat containing right inguinal hernia. 2. Right hydrocele again noted, incompletely evaluated on this exam. 3. No evidence of urinary tract calculi or obstructive uropathy. 4.  Aortic Atherosclerosis (ICD10-I70.0). Electronically Signed   By: Sharlet Salina M.D.   On: 07/31/2020 00:42   US SCROTUM W/DOPPLER  Result Date: 07/31/2020 CLINICAL DATA:  Scrotal pain. EXAM: SCROTAL ULTRASOUND DOPPLER ULTRASOUND OF THE TESTICLES TECHNIQUE: Complete ultrasound examination of the testicles, epididymis, and other scrotal structures was performed. Color and spectral Doppler ultrasound were also utilized to evaluate blood flow to the testicles. COMPARISON:  CT renal 07/31/2020 FINDINGS: Right testicle Measurements: 3.2 x 2.1 x 2.7 cm. No mass or microlithiasis visualized. Left testicle Measurements: 3.5 x 1.8 x 2.7 cm. No mass or microlithiasis visualized. Right epididymis:  Normal in size and appearance. Left epididymis:  Normal in size and appearance. Hydrocele:  Bilateral hydroceles. Varicocele:  None visualized. Pulsed Doppler interrogation of both testes demonstrates normal low  resistance arterial and venous waveforms bilaterally. IMPRESSION: 1. Bilateral hydroceles. 2. Otherwise unremarkable scrotal ultrasound. Electronically Signed   By: Tish Frederickson M.D.   On: 07/31/2020 04:45   VAS [place] LOWER EXTREMITY VENOUS (DVT)  Result Date: 07/31/2020  Lower Venous DVT Study Indications: Edema.  Risk Factors: CHF, chronic LE edema, known PVD. Limitations: Patient uncooperative with exam - movement. Comparison Study: No previous venous duplex exams Performing Technologist: Ernestene Mention  Examination Guidelines: A complete evaluation includes B-mode imaging, spectral Doppler, color Doppler, and power Doppler as needed of all accessible portions of each vessel. Bilateral testing is considered an integral part of a complete examination. Limited examinations for reoccurring indications may be performed as noted. The reflux portion of the exam is performed with the patient in reverse Trendelenburg.  +---------+---------------+---------+-----------+----------+--------------+ RIGHT    CompressibilityPhasicitySpontaneityPropertiesThrombus Aging +---------+---------------+---------+-----------+----------+--------------+ CFV      Full           Yes      Yes                                 +---------+---------------+---------+-----------+----------+--------------+ SFJ      Full                                                        +---------+---------------+---------+-----------+----------+--------------+ FV Prox  Full           Yes      Yes                                 +---------+---------------+---------+-----------+----------+--------------+ FV Mid   Full           Yes      Yes                                 +---------+---------------+---------+-----------+----------+--------------+ FV DistalFull           Yes      Yes                                 +---------+---------------+---------+-----------+----------+--------------+  PFV      Full                                                         +---------+---------------+---------+-----------+----------+--------------+ POP      Full           Yes      Yes                                 +---------+---------------+---------+-----------+----------+--------------+ PTV      Full                                                        +---------+---------------+---------+-----------+----------+--------------+ PERO     Full                                                        +---------+---------------+---------+-----------+----------+--------------+   +---------+---------------+---------+-----------+----------+--------------+ LEFT     CompressibilityPhasicitySpontaneityPropertiesThrombus Aging +---------+---------------+---------+-----------+----------+--------------+ CFV      Full           Yes      Yes                                 +---------+---------------+---------+-----------+----------+--------------+ SFJ      Full                                                        +---------+---------------+---------+-----------+----------+--------------+ FV Prox  Full           Yes      Yes                                 +---------+---------------+---------+-----------+----------+--------------+ FV Mid   Full           Yes      Yes                                 +---------+---------------+---------+-----------+----------+--------------+ FV DistalFull           Yes      Yes                                 +---------+---------------+---------+-----------+----------+--------------+ PFV      Full                                                        +---------+---------------+---------+-----------+----------+--------------+  POP      Full           Yes      Yes                                 +---------+---------------+---------+-----------+----------+--------------+ PTV      Full                                                         +---------+---------------+---------+-----------+----------+--------------+ PERO     Full                                                        +---------+---------------+---------+-----------+----------+--------------+     Summary: BILATERAL: - No evidence of deep vein thrombosis seen in the lower extremities, bilaterally. - No evidence of superficial venous thrombosis in the lower extremities, bilaterally. -No evidence of popliteal cyst, bilaterally.   *See table(s) above for measurements and observations. Electronically signed by Gretta Began MD on 07/31/2020 at 4:10:47 PM.    Final     Scheduled Meds: . atorvastatin  20 mg Oral Daily  . carvedilol  12.5 mg Oral BID WC  . donepezil  10 mg Oral QHS  . [START ON 08/02/2020] enoxaparin (LOVENOX) injection  40 mg Subcutaneous Daily  . insulin glargine  15 Units Subcutaneous Daily  . pantoprazole  40 mg Oral Daily  . vitamin B-12  1,000 mcg Oral Daily   Continuous Infusions: . DAPTOmycin (CUBICIN)  IV 650 mg (08/01/20 1311)     LOS: 0 days   Time spent: 35 minutes.  Tyrone Nine, MD [facility] [email or web address] 08/01/2020, 1:40 PM

## 2020-08-01 NOTE — Progress Notes (Signed)
PHARMACY - PHYSICIAN COMMUNICATION CRITICAL VALUE ALERT - BLOOD CULTURE IDENTIFICATION (BCID)  Ivan Parks is an 80 y.o. male who presented to [facility] on 07/18/2020 with a chief complaint of R groin pain and leukocytosis.  Assessment: Blood cultures growing GPC in 1/2 bottles. BCID reporting MRSA. Patient currently being treated for cellulitis w/o abscess.   Name of physician (or Provider) Contacted: Gwynn Burly, MD  Current antibiotics: Cefazolin 2g IV q8h  Changes to prescribed antibiotics recommended:  ID consult service to see patient today and adjust antibiotics accordingly.   Results for orders placed or performed during the hospital encounter of 07/16/2020  Blood Culture ID Panel (Reflexed) (Collected: 07/31/2020  8:01 AM)  Result Value Ref Range   Enterococcus faecalis NOT DETECTED NOT DETECTED   Enterococcus Faecium NOT DETECTED NOT DETECTED   Listeria monocytogenes NOT DETECTED NOT DETECTED   Staphylococcus species DETECTED (A) NOT DETECTED   Staphylococcus aureus (BCID) DETECTED (A) NOT DETECTED   Staphylococcus epidermidis NOT DETECTED NOT DETECTED   Staphylococcus lugdunensis NOT DETECTED NOT DETECTED   Streptococcus species NOT DETECTED NOT DETECTED   Streptococcus agalactiae NOT DETECTED NOT DETECTED   Streptococcus pneumoniae NOT DETECTED NOT DETECTED   Streptococcus pyogenes NOT DETECTED NOT DETECTED   A.calcoaceticus-baumannii NOT DETECTED NOT DETECTED   Bacteroides fragilis NOT DETECTED NOT DETECTED   Enterobacterales NOT DETECTED NOT DETECTED   Enterobacter cloacae complex NOT DETECTED NOT DETECTED   Escherichia coli NOT DETECTED NOT DETECTED   Klebsiella aerogenes NOT DETECTED NOT DETECTED   Klebsiella oxytoca NOT DETECTED NOT DETECTED   Klebsiella pneumoniae NOT DETECTED NOT DETECTED   Proteus species NOT DETECTED NOT DETECTED   Salmonella species NOT DETECTED NOT DETECTED   Serratia marcescens NOT DETECTED NOT DETECTED   Haemophilus influenzae NOT  DETECTED NOT DETECTED   Neisseria meningitidis NOT DETECTED NOT DETECTED   Pseudomonas aeruginosa NOT DETECTED NOT DETECTED   Stenotrophomonas maltophilia NOT DETECTED NOT DETECTED   Candida albicans NOT DETECTED NOT DETECTED   Candida auris NOT DETECTED NOT DETECTED   Candida glabrata NOT DETECTED NOT DETECTED   Candida krusei NOT DETECTED NOT DETECTED   Candida parapsilosis NOT DETECTED NOT DETECTED   Candida tropicalis NOT DETECTED NOT DETECTED   Cryptococcus neoformans/gattii NOT DETECTED NOT DETECTED   Meth resistant mecA/C and MREJ DETECTED (A) NOT DETECTED    Trixie Rude, PharmD PGY1 Acute Care Pharmacy Resident 08/01/2020 10:02 AM  Please check [email or web address] for unit-specific pharmacy phone numbers.

## 2020-08-01 NOTE — Progress Notes (Signed)
  Echocardiogram 2D Echocardiogram has been performed.  Leta Jungling M 08/01/2020, 3:21 PM

## 2020-08-01 NOTE — Plan of Care (Signed)
  Problem: Education: Goal: Knowledge of General Education information will improve Description Including pain rating scale, medication(s)/side effects and non-pharmacologic comfort measures Outcome: Progressing   Problem: Health Behavior/Discharge Planning: Goal: Ability to manage health-related needs will improve Outcome: Progressing   

## 2020-08-01 NOTE — Evaluation (Signed)
Physical Therapy Evaluation Patient Details Name: Ivan Parks MRN: [id number] DOB: 1941/04/20 Today's Date: 08/01/2020   History of Present Illness  Rudie is a 80 y/o male who reported to ED with complaints of R groin pain and poor appeptite. Pt reported to [facility] in Feb for similar complaints but there were no significant findings. Pt was admitted on 07/22/2020 with abdominal pain. PMH includes dementia, HF, CKD, DM, LE edema, and COPD on 2L O2 at home.    Clinical Impression  Pt received in bed, stating that he only likes to "sit and sleep." Pt poor historian with history of cognitive deficits at baseline. No family member present to assist in pt history. Pt unwilling to contribute to mobility, total Ax2 to EOB. Pt became agitated with movement, questioning PTs, and adamantly refusing refusing addition PT in the future, stating that he doesn't want to move and doesn't want PT to return. Left in bed with all needs met, call bell within reach, bed alarm active, and RN aware of status. Unclear how close pt is to baseline cognitively or functionally, but pt may be able to return home if adequate support available. If not, pt may benefit from SNF.    Follow Up Recommendations Home health PT;SNF;Supervision for mobility/OOB (Home health if family can provide adequate support/supervision)    Equipment Recommendations  3in1 (PT)    Recommendations for Other Services       Precautions / Restrictions Precautions Precautions: Fall Restrictions Weight Bearing Restrictions: No      Mobility  Bed Mobility Overal bed mobility: Needs Assistance Bed Mobility: Supine to Sit;Sit to Supine     Supine to sit: Total assist;+2 for physical assistance Sit to supine: +2 for physical assistance;Total assist   General bed mobility comments: Heavy posterior lean in sitting    Transfers                 General transfer comment: unable  Ambulation/Gait             General Gait  Details: unable  Stairs            Wheelchair Mobility    Modified Rankin (Stroke Patients Only)       Balance Overall balance assessment: Needs assistance Sitting-balance support: Feet supported;Bilateral upper extremity supported Sitting balance-Leahy Scale: Zero   Postural control: Posterior lean     Standing balance comment: unable to assess                             Pertinent Vitals/Pain Pain Assessment: Faces Faces Pain Scale: Hurts little more Pain Location: generalized discomfort Pain Descriptors / Indicators: Discomfort;Guarding Pain Intervention(s): Monitored during session    Home Living                        Prior Function           Comments: Pt poor historian. Per notes from other members of healthcare team, believe he lives with his spouse, son and step son.     Hand Dominance        Extremity/Trunk Assessment   Upper Extremity Assessment Upper Extremity Assessment: Overall WFL for tasks assessed    Lower Extremity Assessment Lower Extremity Assessment: Generalized weakness       Communication      Cognition Arousal/Alertness: Awake/alert Behavior During Therapy: Restless;Agitated Overall Cognitive Status: History of cognitive impairments - at baseline Area of Impairment:  Safety/judgement;Attention;Following commands                   Current Attention Level: Sustained     Safety/Judgement: Decreased awareness of safety     General Comments: No family/caregiver present to determine baseline. Pt refusing to do any mobility, stating he just needs to sit and sleep. Would respond appropriately to some questions but does not follow commands. Aggitated at the suggestion of moving, ask PTs "what do you know," and "how long have you been doing this"      General Comments General comments (skin integrity, edema, etc.): VSS on 2L    Exercises     Assessment/Plan    PT Assessment Patent does not  need any further PT services (Pt adamant on refusing future PT services)  PT Problem List         PT Treatment Interventions      PT Goals (Current goals can be found in the Care Plan section)  Acute Rehab PT Goals Patient Stated Goal: none stated    Frequency     Barriers to discharge        Co-evaluation               AM-PAC PT "6 Clicks" Mobility  Outcome Measure Help needed turning from your back to your side while in a flat bed without using bedrails?: A Lot Help needed moving from lying on your back to sitting on the side of a flat bed without using bedrails?: Total Help needed moving to and from a bed to a chair (including a wheelchair)?: Total Help needed standing up from a chair using your arms (e.g., wheelchair or bedside chair)?: Total Help needed to walk in hospital room?: Total Help needed climbing 3-5 steps with a railing? : Total 6 Click Score: 7    End of Session Equipment Utilized During Treatment: Gait belt Activity Tolerance: Treatment limited secondary to agitation;Other (comment) (Pt very self-limiting, refusing to participate) Patient left: in bed;with call bell/phone within reach;with bed alarm set Nurse Communication: Mobility status PT Visit Diagnosis: Unsteadiness on feet (R26.81);Muscle weakness (generalized) (M62.81)    Time:  -      Charges:         Rosita Kea, SPT

## 2020-08-01 NOTE — Consult Note (Signed)
[facility]    Date of Admission:  07/23/2020     Total days of antibiotics 2               Reason for Consult: MRSA Bacteremia  Referring Provider: [employer] Primary Care Provider: Laurann Montana, MD   ASSESSMENT:  Ivan Parks is an 80 y/o male with MRSA bacteremia in 1 out of 4 bottles with source of infection likely related to his left buttock induration. There are no other clear sources of infection at present. No current surgical intervention is needed. Change Cefazolin to Daptomycin given his chronic renal insufficiency. Repeat blood cultures in the morning and check echocardiogram to rule out endocarditis. Discussed importance of controlling blood sugars as this can complicate healing and increase risk of further infection.   PLAN:  1. Change cefazolin to daptomycin. 2. Monitor CK levels per protocol.  3. Repeat blood cultures in the morning for clearance of bacteremia.  4. TTE to rule out endocarditis.  5. Blood sugar control per primary team.    Principal Problem:   Abdominal pain Active Problems:   COPD, severe (HCC)   Leg edema, right- Greater than Left leg   Essential hypertension   Acute kidney injury (nontraumatic) (HCC)   CHF (congestive heart failure) (HCC)   Controlled type 2 diabetes mellitus with hyperglycemia (HCC)   Pressure injury of skin   . atorvastatin  20 mg Oral Daily  . carvedilol  12.5 mg Oral BID WC  . donepezil  10 mg Oral QHS  . [START ON 08/02/2020] enoxaparin (LOVENOX) injection  40 mg Subcutaneous Daily  . insulin glargine  15 Units Subcutaneous Daily  . pantoprazole  40 mg Oral Daily  . vitamin B-12  1,000 mcg Oral Daily     HPI: Ivan Parks is a 80 y.o. male with previous medical history significant for diabetes, diastolic CHF, chronic venous insufficiency, CKD Stage III, and COPD on home oxygen admitted with 1 month of right groin pain, nausea and decreased appetite.   Ivan Parks was  initially seen for right groin pain on 06/28/20 following a bowel movement. CT abdomen/pelvis with no acute traumatic injury or inflammatory process. Groin pain improved in the ED and was discharged.  Lab work with leukocytosis of 15.1 and elevated creatinine of 1.55. Elevated temperature following admission of 100.1. CT renal with stable small fat containing inguinal hernia and right hydrocele. Scrotal ultrasound with hydroceles otherwise unremarkable. MRI pelvis with right iliopsoas bursitis. Found to have left buttock induration with area of cellulitis. Surgery recommended continuing with antibiotics. Blood cultures turned positive for MRSA bacteremia in 1 out of 4 bottles.    Review of Systems: Review of Systems  Constitutional: Negative for chills, fever and weight loss.  Respiratory: Negative for cough, shortness of breath and wheezing.   Cardiovascular: Negative for chest pain and leg swelling.  Gastrointestinal: Negative for abdominal pain, constipation, diarrhea, nausea and vomiting.  Skin: Negative for rash.     Past Medical History:  Diagnosis Date  . Acute respiratory failure (HCC)   . AKI (acute kidney injury) (HCC) 04/01/2016  . Bilateral lower extremity edema   . Bronchitis   . Chest pain 12/02/2017  . CHF (congestive heart failure) (HCC)   . Chronic respiratory failure (HCC) 11/26/2014  . COPD (chronic obstructive pulmonary disease) (HCC)   . 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 Dementia (HCC)   . Diabetes (HCC) 05/02/2016  . DM (diabetes mellitus) (HCC)   . Essential hypertension 03/06/2015   hypertension   . HTN (hypertension)   . Hyperlipidemia   . Hypotension 03/31/2016  . Hypoxemia   . Leg edema, right- Greater than Left leg 07/05/2014   Lower extremity edema   . OSA (obstructive sleep apnea)   . Peripheral vascular  disease (HCC)   . Tobacco abuse     Social History   Tobacco Use  . Smoking status: Former Smoker    Packs/day: 1.50    Years: 50.00    Pack years: 75.00    Types: Cigarettes    Quit date: 11/04/2010    Years since quitting: 9.7  . Smokeless tobacco: Never Used  Vaping Use  . Vaping Use: Never used  Substance Use Topics  . Alcohol use: No    Alcohol/week: 0.0 standard drinks  . Drug use: No    Family History  Problem Relation Age of Onset  . Diabetes Mother   . Hypertension Mother   . Peripheral vascular disease Mother        amputation  . Diabetes Father        Right Leg Amputation-Gangrene  . Hypertension Father   . Peripheral vascular disease Father        amputation  . Cancer Father        Lead Poison-Ca  . Pneumonia Father   . Diabetes Sister   . Hypertension Sister   . Diabetes Daughter   . Hypertension Daughter     No Known Allergies  OBJECTIVE: Blood pressure (!) 109/53, pulse 83, temperature 98.2 F (36.8 C), resp. rate 17, height 5\' 5"  (1.651 m), weight 71.2 kg, SpO2 100 %.  Physical Exam Constitutional:      General: He is not in acute distress.    Appearance: He is well-developed.     Comments: Elderly gentleman sitting in bed.   Cardiovascular:     Rate and Rhythm: Normal rate and regular rhythm.     Heart sounds: Normal heart sounds.  Pulmonary:     Effort: Pulmonary effort is normal.     Breath sounds: Normal breath sounds.  Skin:    General: Skin is warm and dry.  Neurological:     Mental Status: He is alert and oriented to person, place, and time.  Psychiatric:        Mood and Affect: Mood normal.     Lab Results Lab Results  Component Value Date   WBC 15.1 (H) 08/01/2020   HGB 11.4 (L) 08/01/2020   HCT 37.5 (L) 08/01/2020   MCV 90.8 08/01/2020   PLT 192 08/01/2020    Lab Results  Component Value Date   CREATININE 1.55 (H) 08/01/2020   BUN 19 08/01/2020   NA 135 08/01/2020   K 3.3 (L) 08/01/2020   CL 96 (L) 08/01/2020    CO2 32 08/01/2020    Lab Results  Component Value Date   ALT 11 07/31/2020   AST 19 07/31/2020   ALKPHOS 60 07/31/2020   BILITOT 1.0 07/31/2020     Microbiology: Recent Results (from the past 240 hour(s))  SARS CORONAVIRUS 2 (TAT 6-24 HRS) Nasopharyngeal Nasopharyngeal Swab     Status: None   Collection Time: 07/31/20  3:01 AM   Specimen: Nasopharyngeal Swab  Result Value Ref Range Status   SARS  Coronavirus 2 NEGATIVE NEGATIVE Final    Comment: (NOTE) SARS-CoV-2 target nucleic acids are NOT DETECTED.  The SARS-CoV-2 RNA is generally detectable in upper and lower respiratory specimens during the acute phase of infection. Negative results do not preclude SARS-CoV-2 infection, do not rule out co-infections with other pathogens, and should not be used as the sole basis for treatment or other patient management decisions. Negative results must be combined with clinical observations, patient history, and epidemiological information. The expected result is Negative.  Fact Sheet for Patients: HairSlick.nohttps://www.fda.gov/media/138098/download  Fact Sheet for Healthcare Providers: quierodirigir.comhttps://www.fda.gov/media/138095/download  This test is not yet approved or cleared by the [place] FDA and  has been authorized for detection and/or diagnosis of SARS-CoV-2 by FDA under an Emergency Use Authorization (EUA). This EUA will remain  in effect (meaning this test can be used) for the duration of the COVID-19 declaration under Se ction 564(b)(1) of the Act, 21 U.S.C. section 360bbb-3(b)(1), unless the authorization is terminated or revoked sooner.  Performed at [facility], [address] [place], [place] [address]   Culture, blood (routine x 2)     Status: None (Preliminary result)   Collection Time: 07/31/20  8:01 AM   Specimen: BLOOD LEFT HAND  Result Value Ref Range Status   Specimen Description BLOOD LEFT HAND  Final   Special Requests   Final    BOTTLES DRAWN AEROBIC ONLY  Blood Culture adequate volume   Culture  Setup Time   Final    AEROBIC BOTTLE ONLY GRAM POSITIVE COCCI CRITICAL RESULT CALLED TO, READ BACK BY AND VERIFIED WITH: Marissa NestleHARMD THOMAS J [id number] FCP Performed at [facility], [address] [place], [place] [address]    Culture GRAM POSITIVE COCCI  Final   Report Status PENDING  Incomplete  Blood Culture ID Panel (Reflexed)     Status: Abnormal   Collection Time: 07/31/20  8:01 AM  Result Value Ref Range Status   Enterococcus faecalis NOT DETECTED NOT DETECTED Final   Enterococcus Faecium NOT DETECTED NOT DETECTED Final   Listeria monocytogenes NOT DETECTED NOT DETECTED Final   Staphylococcus species DETECTED (A) NOT DETECTED Final    Comment: CRITICAL RESULT CALLED TO, READ BACK BY AND VERIFIED WITH: Marissa NestleHARMD THOMAS J [id number] FCP    Staphylococcus aureus (BCID) DETECTED (A) NOT DETECTED Final    Comment: Methicillin (oxacillin)-resistant Staphylococcus aureus (MRSA). MRSA is predictably resistant to beta-lactam antibiotics (except ceftaroline). Preferred therapy is vancomycin unless clinically contraindicated. Patient requires contact precautions if  hospitalized. CRITICAL RESULT CALLED TO, READ BACK BY AND VERIFIED WITH: PHARMD THOMAS J [id number] FCP    Staphylococcus epidermidis NOT DETECTED NOT DETECTED Final   Staphylococcus lugdunensis NOT DETECTED NOT DETECTED Final   Streptococcus species NOT DETECTED NOT DETECTED Final   Streptococcus agalactiae NOT DETECTED NOT DETECTED Final   Streptococcus pneumoniae NOT DETECTED NOT DETECTED Final   Streptococcus pyogenes NOT DETECTED NOT DETECTED Final   A.calcoaceticus-baumannii NOT DETECTED NOT DETECTED Final   Bacteroides fragilis NOT DETECTED NOT DETECTED Final   Enterobacterales NOT DETECTED NOT DETECTED Final   Enterobacter cloacae complex NOT DETECTED NOT DETECTED Final   Escherichia coli NOT DETECTED NOT DETECTED Final   Klebsiella aerogenes NOT DETECTED NOT DETECTED  Final   Klebsiella oxytoca NOT DETECTED NOT DETECTED Final   Klebsiella pneumoniae NOT DETECTED NOT DETECTED Final   Proteus species NOT DETECTED NOT DETECTED Final   Salmonella species NOT DETECTED NOT DETECTED Final   Serratia marcescens  NOT DETECTED NOT DETECTED Final   Haemophilus influenzae NOT DETECTED NOT DETECTED Final   Neisseria meningitidis NOT DETECTED NOT DETECTED Final   Pseudomonas aeruginosa NOT DETECTED NOT DETECTED Final   Stenotrophomonas maltophilia NOT DETECTED NOT DETECTED Final   Candida albicans NOT DETECTED NOT DETECTED Final   Candida auris NOT DETECTED NOT DETECTED Final   Candida glabrata NOT DETECTED NOT DETECTED Final   Candida krusei NOT DETECTED NOT DETECTED Final   Candida parapsilosis NOT DETECTED NOT DETECTED Final   Candida tropicalis NOT DETECTED NOT DETECTED Final   Cryptococcus neoformans/gattii NOT DETECTED NOT DETECTED Final   Meth resistant mecA/C and MREJ DETECTED (A) NOT DETECTED Final    Comment: CRITICAL RESULT CALLED TO, READ BACK BY AND VERIFIED WITH: Marissa Nestle [phone] FCP Performed at [facility], [address], [place], [place] [address]   Culture, blood (routine x 2)     Status: None (Preliminary result)   Collection Time: 07/31/20  8:02 AM   Specimen: BLOOD  Result Value Ref Range Status   Specimen Description BLOOD LEFT ANTECUBITAL  Final   Special Requests   Final    BOTTLES DRAWN AEROBIC ONLY Blood Culture adequate volume   Culture   Final    NO GROWTH < 24 HOURS Performed at [facility], [address], [place], [place] [address]    Report Status PENDING  Incomplete     Marcos Eke, NP [facility]  08/01/2020  12:31 PM

## 2020-08-02 DIAGNOSIS — E1165 Type 2 diabetes mellitus with hyperglycemia: Secondary | ICD-10-CM

## 2020-08-02 DIAGNOSIS — R7881 Bacteremia: Secondary | ICD-10-CM | POA: Diagnosis not present

## 2020-08-02 DIAGNOSIS — R109 Unspecified abdominal pain: Secondary | ICD-10-CM | POA: Diagnosis not present

## 2020-08-02 DIAGNOSIS — L89329 Pressure ulcer of left buttock, unspecified stage: Secondary | ICD-10-CM | POA: Diagnosis not present

## 2020-08-02 DIAGNOSIS — N179 Acute kidney failure, unspecified: Secondary | ICD-10-CM | POA: Diagnosis not present

## 2020-08-02 DIAGNOSIS — I5032 Chronic diastolic (congestive) heart failure: Secondary | ICD-10-CM | POA: Diagnosis not present

## 2020-08-02 LAB — CBC WITH DIFFERENTIAL/PLATELET
Abs Immature Granulocytes: 0.11 10*3/uL — ABNORMAL HIGH (ref 0.00–0.07)
Basophils Absolute: 0 10*3/uL (ref 0.0–0.1)
Basophils Relative: 0 %
Eosinophils Absolute: 0.3 10*3/uL (ref 0.0–0.5)
Eosinophils Relative: 2 %
HCT: 40.1 % (ref 39.0–52.0)
Hemoglobin: 12.1 g/dL — ABNORMAL LOW (ref 13.0–17.0)
Immature Granulocytes: 1 %
Lymphocytes Relative: 5 %
Lymphs Abs: 0.8 10*3/uL (ref 0.7–4.0)
MCH: 27.6 pg (ref 26.0–34.0)
MCHC: 30.2 g/dL (ref 30.0–36.0)
MCV: 91.6 fL (ref 80.0–100.0)
Monocytes Absolute: 1.5 10*3/uL — ABNORMAL HIGH (ref 0.1–1.0)
Monocytes Relative: 9 %
Neutro Abs: 13.9 10*3/uL — ABNORMAL HIGH (ref 1.7–7.7)
Neutrophils Relative %: 83 %
Platelets: 186 10*3/uL (ref 150–400)
RBC: 4.38 MIL/uL (ref 4.22–5.81)
RDW: 12.3 % (ref 11.5–15.5)
WBC: 16.5 10*3/uL — ABNORMAL HIGH (ref 4.0–10.5)
nRBC: 0 % (ref 0.0–0.2)

## 2020-08-02 LAB — BASIC METABOLIC PANEL
Anion gap: 7 (ref 5–15)
BUN: 25 mg/dL — ABNORMAL HIGH (ref 8–23)
CO2: 30 mmol/L (ref 22–32)
Calcium: 8 mg/dL — ABNORMAL LOW (ref 8.9–10.3)
Chloride: 98 mmol/L (ref 98–111)
Creatinine, Ser: 1.84 mg/dL — ABNORMAL HIGH (ref 0.61–1.24)
GFR, Estimated: 37 mL/min — ABNORMAL LOW (ref >60)
Glucose, Bld: 86 mg/dL (ref 70–99)
Potassium: 3.9 mmol/L (ref 3.5–5.1)
Sodium: 135 mmol/L (ref 135–145)

## 2020-08-02 LAB — GLUCOSE, CAPILLARY
Glucose-Capillary: 77 mg/dL (ref 70–99)
Glucose-Capillary: 59 mg/dL — ABNORMAL LOW (ref 70–99)
Glucose-Capillary: 67 mg/dL — ABNORMAL LOW (ref 70–99)
Glucose-Capillary: 157 mg/dL — ABNORMAL HIGH (ref 70–99)
Glucose-Capillary: 167 mg/dL — ABNORMAL HIGH (ref 70–99)
Glucose-Capillary: 105 mg/dL — ABNORMAL HIGH (ref 70–99)

## 2020-08-02 LAB — HEMOGLOBIN A1C
Hgb A1c MFr Bld: 8.7 % — ABNORMAL HIGH (ref 4.8–5.6)
Mean Plasma Glucose: 202.99 mg/dL

## 2020-08-02 MED ORDER — TRAMADOL HCL 50 MG PO TABS
50.0000 mg | ORAL_TABLET | Freq: Once | ORAL | Status: AC
Start: 2020-08-02 — End: 2020-08-02
  Administered 2020-08-02: 50 mg via ORAL
  Filled 2020-08-02: qty 1

## 2020-08-02 MED ORDER — SODIUM CHLORIDE 0.9 % IV SOLN
650.0000 mg | INTRAVENOUS | Status: DC
  Administered 2020-08-03: 650 mg via INTRAVENOUS
  Filled 2020-08-02 (×2): qty 13

## 2020-08-02 MED ORDER — ENOXAPARIN SODIUM 30 MG/0.3ML ~~LOC~~ SOLN
30.0000 mg | Freq: Every day | SUBCUTANEOUS | Status: DC
  Administered 2020-08-02 – 2020-08-04 (×3): 30 mg via SUBCUTANEOUS
  Filled 2020-08-02 (×4): qty 0.3

## 2020-08-02 MED ORDER — TRAMADOL HCL 50 MG PO TABS
50.0000 mg | ORAL_TABLET | Freq: Two times a day (BID) | ORAL | Status: DC | PRN
  Administered 2020-08-02 – 2020-08-03 (×3): 50 mg via ORAL
  Filled 2020-08-02 (×4): qty 1

## 2020-08-02 MED ORDER — DEXTROSE 50 % IV SOLN
INTRAVENOUS | Status: AC
  Administered 2020-08-02: 50 mL
  Filled 2020-08-02: qty 50

## 2020-08-02 NOTE — Progress Notes (Signed)
PROGRESS NOTE  Ivan Parks  PFX:[id number] DOB: 11/11/1940 DOA: 08/04/2020 PCP: Laurann Montana, MD   Brief Narrative: Ivan Parks is an 80 y.o. male with a history of T2DM, chronic HFpEF, venous insufficiency, stage IIIb CKD, 2L O2-dependent COPD, cognitive deficits, and right inguinal hernia who presented to the ED for the 2nd time in the past month with right groin pain, poor appetite. He demonstrated a leukocytosis to 15.7k and AKI with creatinine of 1.9 from baseline of 1.3. Hydrocele seen on scrotum U/S without torsion. CT renal stone study without acute abnormality to explain pain. Exam benign. Patient was admitted with pelvic U/S pending this AM. Left buttock wound was noted with induration and no abscess on exam or imaging for which surgery was consulted (no I&D recommended) and ancef was started. Blood culture has resulted with MRSA for which ID was consulted and antibiotic changed to daptomycin. TTE without vegetation. TEE planned 08/05/2020.   Assessment & Plan: Principal Problem:   MRSA bacteremia Active Problems:   COPD, severe (HCC)   Leg edema, right- Greater than Left leg   Essential hypertension   Acute kidney injury (nontraumatic) (HCC)   CHF (congestive heart failure) (HCC)   Abdominal pain   Controlled type 2 diabetes mellitus with hyperglycemia (HCC)   Pressure injury of skin  Hydrocele, right groin pain, right inguinal hernia: Hernia is reducible, though may be contributing to some mild discomfort.  - Supportive care  Cellulitis, left buttock wound, MRSA bacteremia: No fluctuance on palpation. No debridement recommended by surgery.  - Continue daptomycin per ID. TTE negative for vegetation, repeat blood cultures obtained 3/18 AM. TEE scheduled for 3/21. - Continue local wound care per surgery/WOC.  - Monitor blood culture data.  - Restart home tramadol for pain control.  AKI on stage IIIb CKD: Improving. Baseline SCr 1.3-1.5. 1.94 on admission.  - Hold ACE  and diuretic. Does not appear to be overloaded.   Chronic HFpEF: LVEF preserved, though G2DD and dilated IVC on TTE 3/17. Given a dose of lasix due to leg swelling, though Cr bumped so will not continue this at this time with limited po intake. - Hold lasix, ACEi with AKI as above.   Venous insufficiency: No DVT on U/S - Elevate legs.  COPD: Severe.  - Continue supplemental oxygen, does not appear to be in exacerbation.   HTN: Continue coreg.   Cognitive impairment, debility:  - PT/OT evaluations. Has assistance with wife, son, step son and daughter - Continue aricept  T2DM: HbA1c down to 8.7% from 10.9%. CBGs well controlled. - Continue CBGs AC/HS and SSI. Continue lantus 15u daily.   DVT prophylaxis: Lovenox Code Status: Full Family Communication: None at bedside. Will call wife for update today Disposition Plan:  Status is: Inpatient  Remains inpatient appropriate because:Ongoing diagnostic testing needed not appropriate for outpatient work up, Unsafe d/c plan and IV treatments appropriate due to intensity of illness or inability to take PO  Dispo: The patient is from: Home              Anticipated d/c is to: PT/OT consults pending - anticipate SNF rehab              Patient currently is not medically stable to d/c.   Difficult to place patient No  Consultants:   Surgery  ID  Procedures:   None  Antimicrobials:  Ancef 3/16 - 3/17  Daptomycin 3/17 >>   Subjective: Left buttock pain is stable, moderate-severe, has not taken  tramadol yet. No fevers or new complaints.   Objective: Vitals:   08/01/20 1651 08/01/20 2045 08/02/20 0507 08/02/20 1003  BP: (!) 97/49 (!) 99/57 (!) 103/55 (!) 110/51  Pulse: 69 75 78 78  Resp: 17 16 18 18   Temp: 98 F (36.7 C) 98.1 F (36.7 C) 98.7 F (37.1 C) 98.2 F (36.8 C)  TempSrc:  Oral Oral   SpO2: 100% 98% 97% 100%  Weight:      Height:        Intake/Output Summary (Last 24 hours) at 08/02/2020 1129 Last data  filed at 08/02/2020 1022 Gross per 24 hour  Intake 1120 ml  Output 700 ml  Net 420 ml   Filed Weights   08/11/20 2122  Weight: 71.2 kg   Gen: Pleasant, chronically ill-appearing male in no distress Pulm: Nonlabored breathing room air. Clear. CV: Regular rate and rhythm. No murmur, rub, or gallop. No JVD, stable dependent edema. GI: Abdomen soft, non-tender, non-distended, with normoactive bowel sounds.  Ext: Warm, no deformities Skin: No new rashes, lesions or ulcers on visualized skin. Left buttock eschar with surrounding dense induration is stable and tender. No fluctuance at this time. Neuro: Alert and essentially oriented. No focal neurological deficits. Psych: Judgement and insight appear marginal. Mood euthymic & affect congruent. Behavior is appropriate.    Data Reviewed: I have personally reviewed following labs and imaging studies  CBC: Recent Labs  Lab 08/11/20 2151 07/31/20 0550 08/01/20 0251 08/02/20 0557  WBC 15.7* 15.7* 15.1* 16.5*  NEUTROABS  --  13.0*  --  13.9*  HGB 13.2 12.5* 11.4* 12.1*  HCT 44.3 40.7 37.5* 40.1  MCV 91.0 89.1 90.8 91.6  PLT 227 190 192 186   Basic Metabolic Panel: Recent Labs  Lab 08-11-2020 2151 07/31/20 0550 08/01/20 0251 08/02/20 0557  NA 134* 134* 135 135  K 3.9 3.6 3.3* 3.9  CL 96* 97* 96* 98  CO2 28 27 32 30  GLUCOSE 209* 154* 81 86  BUN 24* 21 19 25*  CREATININE 1.94* 1.64* 1.55* 1.84*  CALCIUM 8.7* 8.2* 8.3* 8.0*   GFR: Estimated Creatinine Clearance: 27.9 mL/min (A) (by C-G formula based on SCr of 1.84 mg/dL (H)). Liver Function Tests: Recent Labs  Lab 08-11-20 2151 07/31/20 0550  AST 20 19  ALT 11 11  ALKPHOS 73 60  BILITOT 1.0 1.0  PROT 7.7 6.8  ALBUMIN 2.7* 2.3*   Cardiac Enzymes: Recent Labs  Lab 08/01/20 1026  CKTOTAL 340   Urine analysis:    Component Value Date/Time   COLORURINE YELLOW 07/31/2020 2033   APPEARANCEUR CLEAR 07/31/2020 2033   LABSPEC 1.015 07/31/2020 2033   PHURINE 5.0  07/31/2020 2033   GLUCOSEU NEGATIVE 07/31/2020 2033   HGBUR MODERATE (A) 07/31/2020 2033   BILIRUBINUR NEGATIVE 07/31/2020 2033   KETONESUR NEGATIVE 07/31/2020 2033   PROTEINUR 100 (A) 07/31/2020 2033   UROBILINOGEN 0.2 09/19/2011 1701   NITRITE NEGATIVE 07/31/2020 2033   LEUKOCYTESUR NEGATIVE 07/31/2020 2033   Recent Results (from the past 240 hour(s))  SARS CORONAVIRUS 2 (TAT 6-24 HRS) Nasopharyngeal Nasopharyngeal Swab     Status: None   Collection Time: 07/31/20  3:01 AM   Specimen: Nasopharyngeal Swab  Result Value Ref Range Status   SARS Coronavirus 2 NEGATIVE NEGATIVE Final    Comment: (NOTE) SARS-CoV-2 target nucleic acids are NOT DETECTED.  The SARS-CoV-2 RNA is generally detectable in upper and lower respiratory specimens during the acute phase of infection. Negative results do not preclude SARS-CoV-2 infection,  do not rule out co-infections with other pathogens, and should not be used as the sole basis for treatment or other patient management decisions. Negative results must be combined with clinical observations, patient history, and epidemiological information. The expected result is Negative.  Fact Sheet for Patients: HairSlick.no  Fact Sheet for Healthcare Providers: quierodirigir.com  This test is not yet approved or cleared by the [place] FDA and  has been authorized for detection and/or diagnosis of SARS-CoV-2 by FDA under an Emergency Use Authorization (EUA). This EUA will remain  in effect (meaning this test can be used) for the duration of the COVID-19 declaration under Se ction 564(b)(1) of the Act, 21 U.S.C. section 360bbb-3(b)(1), unless the authorization is terminated or revoked sooner.  Performed at [facility], [address], [place], [place] [address]   Culture, blood (routine x 2)     Status: Abnormal (Preliminary result)   Collection Time: 07/31/20  8:01 AM   Specimen:  BLOOD LEFT HAND  Result Value Ref Range Status   Specimen Description BLOOD LEFT HAND  Final   Special Requests   Final    BOTTLES DRAWN AEROBIC ONLY Blood Culture adequate volume   Culture  Setup Time   Final    AEROBIC BOTTLE ONLY GRAM POSITIVE COCCI CRITICAL RESULT CALLED TO, READ BACK BY AND VERIFIED WITH: PHARMD Ihor Austin [id number] FCP    Culture (A)  Final    STAPHYLOCOCCUS AUREUS SUSCEPTIBILITIES TO FOLLOW Performed at [facility], [address], [place], [place] [address]    Report Status PENDING  Incomplete  Blood Culture ID Panel (Reflexed)     Status: Abnormal   Collection Time: 07/31/20  8:01 AM  Result Value Ref Range Status   Enterococcus faecalis NOT DETECTED NOT DETECTED Final   Enterococcus Faecium NOT DETECTED NOT DETECTED Final   Listeria monocytogenes NOT DETECTED NOT DETECTED Final   Staphylococcus species DETECTED (A) NOT DETECTED Final    Comment: CRITICAL RESULT CALLED TO, READ BACK BY AND VERIFIED WITH: Marissa Nestle [id number] FCP    Staphylococcus aureus (BCID) DETECTED (A) NOT DETECTED Final    Comment: Methicillin (oxacillin)-resistant Staphylococcus aureus (MRSA). MRSA is predictably resistant to beta-lactam antibiotics (except ceftaroline). Preferred therapy is vancomycin unless clinically contraindicated. Patient requires contact precautions if  hospitalized. CRITICAL RESULT CALLED TO, READ BACK BY AND VERIFIED WITH: PHARMD THOMAS J [id number] FCP    Staphylococcus epidermidis NOT DETECTED NOT DETECTED Final   Staphylococcus lugdunensis NOT DETECTED NOT DETECTED Final   Streptococcus species NOT DETECTED NOT DETECTED Final   Streptococcus agalactiae NOT DETECTED NOT DETECTED Final   Streptococcus pneumoniae NOT DETECTED NOT DETECTED Final   Streptococcus pyogenes NOT DETECTED NOT DETECTED Final   A.calcoaceticus-baumannii NOT DETECTED NOT DETECTED Final   Bacteroides fragilis NOT DETECTED NOT DETECTED Final   Enterobacterales NOT  DETECTED NOT DETECTED Final   Enterobacter cloacae complex NOT DETECTED NOT DETECTED Final   Escherichia coli NOT DETECTED NOT DETECTED Final   Klebsiella aerogenes NOT DETECTED NOT DETECTED Final   Klebsiella oxytoca NOT DETECTED NOT DETECTED Final   Klebsiella pneumoniae NOT DETECTED NOT DETECTED Final   Proteus species NOT DETECTED NOT DETECTED Final   Salmonella species NOT DETECTED NOT DETECTED Final   Serratia marcescens NOT DETECTED NOT DETECTED Final   Haemophilus influenzae NOT DETECTED NOT DETECTED Final   Neisseria meningitidis NOT DETECTED NOT DETECTED Final   Pseudomonas aeruginosa NOT DETECTED NOT DETECTED Final   Stenotrophomonas maltophilia  NOT DETECTED NOT DETECTED Final   Candida albicans NOT DETECTED NOT DETECTED Final   Candida auris NOT DETECTED NOT DETECTED Final   Candida glabrata NOT DETECTED NOT DETECTED Final   Candida krusei NOT DETECTED NOT DETECTED Final   Candida parapsilosis NOT DETECTED NOT DETECTED Final   Candida tropicalis NOT DETECTED NOT DETECTED Final   Cryptococcus neoformans/gattii NOT DETECTED NOT DETECTED Final   Meth resistant mecA/C and MREJ DETECTED (A) NOT DETECTED Final    Comment: CRITICAL RESULT CALLED TO, READ BACK BY AND VERIFIED WITH: Marissa NestleHARMD THOMAS J [id number] FCP Performed at [facility], [address] [place], [place] [address]   Culture, blood (routine x 2)     Status: None (Preliminary result)   Collection Time: 07/31/20  8:02 AM   Specimen: BLOOD  Result Value Ref Range Status   Specimen Description BLOOD LEFT ANTECUBITAL  Final   Special Requests   Final    BOTTLES DRAWN AEROBIC ONLY Blood Culture adequate volume   Culture   Final    NO GROWTH 2 DAYS Performed at [facility], [address] [place], [place] [address]    Report Status PENDING  Incomplete  Culture, blood (routine x 2)     Status: None (Preliminary result)   Collection Time: 08/02/20  5:57 AM   Specimen: BLOOD LEFT HAND  Result Value Ref  Range Status   Specimen Description BLOOD LEFT HAND  Final   Special Requests   Final    BOTTLES DRAWN AEROBIC AND ANAEROBIC Blood Culture adequate volume Performed at [facility], [address] [place], [place] [address]    Culture PENDING  Incomplete   Report Status PENDING  Incomplete      Radiology Studies: ECHOCARDIOGRAM COMPLETE  Result Date: 08/01/2020    ECHOCARDIOGRAM REPORT   Patient Name:   Ivan Parks Date of Exam: 08/01/2020 Medical Rec #:  [id number]     Height:       65.0 in Accession #:    [id number]    Weight:       157.0 lb Date of Birth:  [birth year]    BSA:          1.785 m Patient Age:    79 years      BP:           109/53 mmHg Patient Gender: M             HR:           83 bpm. Exam Location:  Inpatient Procedure: 2D Echo, Cardiac Doppler and Color Doppler Indications:    Bacteremia R78.81  History:        Patient has prior history of Echocardiogram examinations. CHF,                 COPD, Signs/Symptoms:Dementia; Risk Factors:Hypertension,                 Diabetes, Dyslipidemia and Sleep Apnea. Acute kidney injury.                 Gram-positive bacteremia. Cellulitis,.  Sonographer:    Leta Junglingiffany Cooper RDCS Referring Phys: [id number] GREGORY D CALONE  Sonographer Comments: Exam ended per patients request. IMPRESSIONS  1. Left ventricular ejection fraction, by estimation, is 55 to 60%. The left ventricle has normal function. The left ventricle has no regional wall motion abnormalities. There is mild concentric left ventricular hypertrophy. Left ventricular diastolic parameters are consistent with Grade  II diastolic dysfunction (pseudonormalization). Elevated left atrial pressure.  2. Right ventricular systolic function is normal. The right ventricular size is normal.  3. The mitral valve is degenerative. No evidence of mitral valve regurgitation.  4. The aortic valve is tricuspid. There is moderate calcification of the aortic valve. There is moderate thickening of the aortic  valve. Aortic valve regurgitation is mild to moderate. Mild to moderate aortic valve sclerosis/calcification is present, without any evidence of aortic stenosis.  5. The inferior vena cava is dilated in size with <50% respiratory variability, suggesting right atrial pressure of 15 mmHg. Conclusion(s)/Recommendation(s): No evidence of valvular vegetations on this transthoracic echocardiogram. Would recommend a transesophageal echocardiogram to exclude infective endocarditis if clinically indicated. FINDINGS  Left Ventricle: Left ventricular ejection fraction, by estimation, is 55 to 60%. The left ventricle has normal function. The left ventricle has no regional wall motion abnormalities. The left ventricular internal cavity size was normal in size. There is  mild concentric left ventricular hypertrophy. Left ventricular diastolic parameters are consistent with Grade II diastolic dysfunction (pseudonormalization). Elevated left atrial pressure. Right Ventricle: The right ventricular size is normal. No increase in right ventricular wall thickness. Right ventricular systolic function is normal. Left Atrium: Left atrial size was normal in size. Right Atrium: Right atrial size was normal in size. Pericardium: There is no evidence of pericardial effusion. Mitral Valve: The mitral valve is degenerative in appearance. There is moderate thickening of the mitral valve leaflet(s). Mild to moderate mitral annular calcification. No evidence of mitral valve regurgitation. Tricuspid Valve: The tricuspid valve is normal in structure. Tricuspid valve regurgitation is not demonstrated. Aortic Valve: The aortic valve is tricuspid. There is moderate calcification of the aortic valve. There is moderate thickening of the aortic valve. Aortic valve regurgitation is mild to moderate. Aortic regurgitation PHT measures 538 msec. Mild to moderate aortic valve sclerosis/calcification is present, without any evidence of aortic stenosis. Aortic  valve mean gradient measures 6.4 mmHg. Aortic valve peak gradient measures 11.9 mmHg. Pulmonic Valve: The pulmonic valve was grossly normal. Pulmonic valve regurgitation is not visualized. Aorta: The aortic root is normal in size and structure. Venous: The inferior vena cava is dilated in size with less than 50% respiratory variability, suggesting right atrial pressure of 15 mmHg. IAS/Shunts: No atrial level shunt detected by color flow Doppler.  LEFT VENTRICLE PLAX 2D LVIDd:         4.10 cm Diastology LVIDs:         3.70 cm LV e' medial:    5.98 cm/s LV PW:         1.30 cm LV E/e' medial:  15.3 LV IVS:        1.30 cm LV e' lateral:   6.53 cm/s                        LV E/e' lateral: 14.0  RIGHT VENTRICLE RV S prime:     10.70 cm/s TAPSE (M-mode): 1.5 cm LEFT ATRIUM             Index       RIGHT ATRIUM           Index LA diam:        2.40 cm 1.34 cm/m  RA Area:     13.20 cm LA Vol (A2C):   35.7 ml 20.00 ml/m RA Volume:   33.70 ml  18.88 ml/m LA Vol (A4C):   25.8 ml 14.43 ml/m LA Biplane Vol: 36.7  ml 20.56 ml/m  AORTIC VALVE AV Vmax:           172.60 cm/s AV Vmean:          117.400 cm/s AV VTI:            0.313 m AV Peak Grad:      11.9 mmHg AV Mean Grad:      6.4 mmHg LVOT Vmax:         73.05 cm/s LVOT Vmean:        47.100 cm/s LVOT VTI:          0.130 m LVOT/AV VTI ratio: 0.42 AI PHT:            538 msec  AORTA Ao Root diam: 3.50 cm MITRAL VALVE MV Area (PHT): 4.06 cm    SHUNTS MV Decel Time: 187 msec    Systemic VTI: 0.13 m MV E velocity: 91.70 cm/s MV A velocity: 97.30 cm/s MV E/A ratio:  0.94 Mihai Croitoru MD Electronically signed by Thurmon Fair MD Signature Date/Time: 08/01/2020/3:28:18 PM    Final     Scheduled Meds: . atorvastatin  20 mg Oral Daily  . carvedilol  12.5 mg Oral BID WC  . donepezil  10 mg Oral QHS  . enoxaparin (LOVENOX) injection  30 mg Subcutaneous Daily  . insulin aspart  0-9 Units Subcutaneous TID WC  . insulin glargine  15 Units Subcutaneous Daily  . pantoprazole  40 mg  Oral Daily  . polyethylene glycol  17 g Oral Daily  . vitamin B-12  1,000 mcg Oral Daily   Continuous Infusions: . [START ON 08/03/2020] DAPTOmycin (CUBICIN)  IV       LOS: 1 day   Time spent: 35 minutes.  Tyrone Nine, MD [facility] [email or web address] 08/02/2020, 11:29 AM

## 2020-08-02 NOTE — Progress Notes (Signed)
Hypoglycemic Event  CBG: 59 at 1139  Treatment: 2 apple juices, 1 orange juice, encouraged to eat lunch  CBG: 67 at 1203  Symptoms: None  Follow-up CBG: Time: 1203 and 1249 after d50 and lunch   CBG Result:157  Possible Reasons for Event: Inadequate meal intake  Comments/MD notified: MD notified, hypoglycemic protocol initiated    Ivan Parks

## 2020-08-02 NOTE — Progress Notes (Signed)
[facility]  Date of Admission:  08/11/2020     Total days of antibiotics 3         ASSESSMENT:  Mr. Portlock repeat blood cultures drawn this morning and are pending. TTE without evidence of vegetation and will check TEE which is scheduled for Monday. Nidus of infection remains likely his left buttock induration. Discussed the need for TEE and long term course of antibiotics between 4-6 weeks depending upon TEE results. Continue daptomycin with current CK level of 340.   PLAN:  1. Continue daptomycin.  2. Monitor blood cultures for clearance of bactermia.  3. TEE scheduled for Monday.  4. Monitor CK levels for therapeutic drug monitoring while on daptomycin.  5. Diabetes management per primary team.   Dr. Renold Don is available over the weekend as needed for ID related questions or concerns. Dr. Earlene Plater to follow up on Monday.   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 atorvastatin  20 mg Oral Daily  . carvedilol  12.5 mg Oral BID WC  . donepezil  10 mg Oral QHS  . enoxaparin (LOVENOX) injection  30 mg Subcutaneous Daily  . insulin aspart  0-9 Units Subcutaneous TID WC  . insulin glargine  15 Units Subcutaneous Daily  . pantoprazole  40 mg Oral Daily  . polyethylene glycol  17 g Oral Daily  . vitamin B-12  1,000 mcg Oral Daily    SUBJECTIVE:  Afebrile overnight with no acute events. Has concerns about fruit flies as the source of his infection.   No Known Allergies   Review of Systems: Review of Systems  Constitutional: Negative for chills, fever and weight loss.  Respiratory: Negative for cough, shortness of breath and wheezing.   Cardiovascular:  Negative for chest pain and leg swelling.  Gastrointestinal: Negative for abdominal pain, constipation, diarrhea, nausea and vomiting.  Skin: Negative for rash.      OBJECTIVE: Vitals:   08/01/20 1651 08/01/20 2045 08/02/20 0507 08/02/20 1003  BP: (!) 97/49 (!) 99/57 (!) 103/55 (!) 110/51  Pulse: 69 75 78 78  Resp: 17 16 18 18   Temp: 98 F (36.7 C) 98.1 F (36.7 C) 98.7 F (37.1 C) 98.2 F (36.8 C)  TempSrc:  Oral Oral   SpO2: 100% 98% 97% 100%  Weight:      Height:       Body mass index is 26.13 kg/m.  Physical Exam Constitutional:      General: He is not in acute distress.    Appearance: He is well-developed.     Comments: Lying in bed with head of bed elevated; pleasant.   Cardiovascular:     Rate and Rhythm: Normal rate and regular rhythm.     Heart sounds: Normal heart sounds.  Pulmonary:     Effort: Pulmonary effort is normal.     Breath sounds: Normal breath sounds.  Skin:    General: Skin is warm and dry.  Neurological:     Mental Status: He is alert and oriented to person, place, and time.  Psychiatric:        Behavior: Behavior normal.        Thought  Content: Thought content normal.        Judgment: Judgment normal.     Lab Results Lab Results  Component Value Date   WBC 16.5 (H) 08/02/2020   HGB 12.1 (L) 08/02/2020   HCT 40.1 08/02/2020   MCV 91.6 08/02/2020   PLT 186 08/02/2020    Lab Results  Component Value Date   CREATININE 1.84 (H) 08/02/2020   BUN 25 (H) 08/02/2020   NA 135 08/02/2020   K 3.9 08/02/2020   CL 98 08/02/2020   CO2 30 08/02/2020    Lab Results  Component Value Date   ALT 11 07/31/2020   AST 19 07/31/2020   ALKPHOS 60 07/31/2020   BILITOT 1.0 07/31/2020     Microbiology: Recent Results (from the past 240 hour(s))  SARS CORONAVIRUS 2 (TAT 6-24 HRS) Nasopharyngeal Nasopharyngeal Swab     Status: None   Collection Time: 07/31/20  3:01 AM   Specimen: Nasopharyngeal Swab  Result Value Ref Range Status   SARS  Coronavirus 2 NEGATIVE NEGATIVE Final    Comment: (NOTE) SARS-CoV-2 target nucleic acids are NOT DETECTED.  The SARS-CoV-2 RNA is generally detectable in upper and lower respiratory specimens during the acute phase of infection. Negative results do not preclude SARS-CoV-2 infection, do not rule out co-infections with other pathogens, and should not be used as the sole basis for treatment or other patient management decisions. Negative results must be combined with clinical observations, patient history, and epidemiological information. The expected result is Negative.  Fact Sheet for Patients: HairSlick.no  Fact Sheet for Healthcare Providers: quierodirigir.com  This test is not yet approved or cleared by the [place] FDA and  has been authorized for detection and/or diagnosis of SARS-CoV-2 by FDA under an Emergency Use Authorization (EUA). This EUA will remain  in effect (meaning this test can be used) for the duration of the COVID-19 declaration under Se ction 564(b)(1) of the Act, 21 U.S.C. section 360bbb-3(b)(1), unless the authorization is terminated or revoked sooner.  Performed at [facility], [address] [place], [place] [address]   Culture, blood (routine x 2)     Status: Abnormal (Preliminary result)   Collection Time: 07/31/20  8:01 AM   Specimen: BLOOD LEFT HAND  Result Value Ref Range Status   Specimen Description BLOOD LEFT HAND  Final   Special Requests   Final    BOTTLES DRAWN AEROBIC ONLY Blood Culture adequate volume   Culture  Setup Time   Final    AEROBIC BOTTLE ONLY GRAM POSITIVE COCCI CRITICAL RESULT CALLED TO, READ BACK BY AND VERIFIED WITH: PHARMD Ihor Austin [phone] FCP    Culture (A)  Final    STAPHYLOCOCCUS AUREUS SUSCEPTIBILITIES TO FOLLOW Performed at [facility], [address], [place], [place] [address]    Report Status PENDING  Incomplete  Blood Culture ID Panel  (Reflexed)     Status: Abnormal   Collection Time: 07/31/20  8:01 AM  Result Value Ref Range Status   Enterococcus faecalis NOT DETECTED NOT DETECTED Final   Enterococcus Faecium NOT DETECTED NOT DETECTED Final   Listeria monocytogenes NOT DETECTED NOT DETECTED Final   Staphylococcus species DETECTED (A) NOT DETECTED Final    Comment: CRITICAL RESULT CALLED TO, READ BACK BY AND VERIFIED WITH: Marissa Nestle [phone] FCP    Staphylococcus aureus (BCID) DETECTED (A) NOT DETECTED Final    Comment: Methicillin (oxacillin)-resistant Staphylococcus aureus (MRSA). MRSA is predictably resistant to beta-lactam antibiotics (  except ceftaroline). Preferred therapy is vancomycin unless clinically contraindicated. Patient requires contact precautions if  hospitalized. CRITICAL RESULT CALLED TO, READ BACK BY AND VERIFIED WITH: PHARMD THOMAS J [phone] FCP    Staphylococcus epidermidis NOT DETECTED NOT DETECTED Final   Staphylococcus lugdunensis NOT DETECTED NOT DETECTED Final   Streptococcus species NOT DETECTED NOT DETECTED Final   Streptococcus agalactiae NOT DETECTED NOT DETECTED Final   Streptococcus pneumoniae NOT DETECTED NOT DETECTED Final   Streptococcus pyogenes NOT DETECTED NOT DETECTED Final   A.calcoaceticus-baumannii NOT DETECTED NOT DETECTED Final   Bacteroides fragilis NOT DETECTED NOT DETECTED Final   Enterobacterales NOT DETECTED NOT DETECTED Final   Enterobacter cloacae complex NOT DETECTED NOT DETECTED Final   Escherichia coli NOT DETECTED NOT DETECTED Final   Klebsiella aerogenes NOT DETECTED NOT DETECTED Final   Klebsiella oxytoca NOT DETECTED NOT DETECTED Final   Klebsiella pneumoniae NOT DETECTED NOT DETECTED Final   Proteus species NOT DETECTED NOT DETECTED Final   Salmonella species NOT DETECTED NOT DETECTED Final   Serratia marcescens NOT DETECTED NOT DETECTED Final   Haemophilus influenzae NOT DETECTED NOT DETECTED Final   Neisseria meningitidis NOT DETECTED NOT  DETECTED Final   Pseudomonas aeruginosa NOT DETECTED NOT DETECTED Final   Stenotrophomonas maltophilia NOT DETECTED NOT DETECTED Final   Candida albicans NOT DETECTED NOT DETECTED Final   Candida auris NOT DETECTED NOT DETECTED Final   Candida glabrata NOT DETECTED NOT DETECTED Final   Candida krusei NOT DETECTED NOT DETECTED Final   Candida parapsilosis NOT DETECTED NOT DETECTED Final   Candida tropicalis NOT DETECTED NOT DETECTED Final   Cryptococcus neoformans/gattii NOT DETECTED NOT DETECTED Final   Meth resistant mecA/C and MREJ DETECTED (A) NOT DETECTED Final    Comment: CRITICAL RESULT CALLED TO, READ BACK BY AND VERIFIED WITH: Marissa Nestle [phone] FCP Performed at [facility], [address], [place], [place] [address]   Culture, blood (routine x 2)     Status: None (Preliminary result)   Collection Time: 07/31/20  8:02 AM   Specimen: BLOOD  Result Value Ref Range Status   Specimen Description BLOOD LEFT ANTECUBITAL  Final   Special Requests   Final    BOTTLES DRAWN AEROBIC ONLY Blood Culture adequate volume   Culture   Final    NO GROWTH 2 DAYS Performed at [facility], [address], [place], [place] [address]    Report Status PENDING  Incomplete  Culture, blood (routine x 2)     Status: None (Preliminary result)   Collection Time: 08/02/20  5:57 AM   Specimen: BLOOD LEFT HAND  Result Value Ref Range Status   Specimen Description BLOOD LEFT HAND  Final   Special Requests   Final    BOTTLES DRAWN AEROBIC AND ANAEROBIC Blood Culture adequate volume Performed at [facility], [address], [place], [place] [address]    Culture PENDING  Incomplete   Report Status PENDING  Incomplete     Marcos Eke, NP [facility]  08/02/2020  10:55 AM

## 2020-08-03 DIAGNOSIS — R7881 Bacteremia: Secondary | ICD-10-CM | POA: Diagnosis not present

## 2020-08-03 DIAGNOSIS — E1165 Type 2 diabetes mellitus with hyperglycemia: Secondary | ICD-10-CM | POA: Diagnosis not present

## 2020-08-03 DIAGNOSIS — N179 Acute kidney failure, unspecified: Secondary | ICD-10-CM | POA: Diagnosis not present

## 2020-08-03 DIAGNOSIS — L89329 Pressure ulcer of left buttock, unspecified stage: Secondary | ICD-10-CM | POA: Diagnosis not present

## 2020-08-03 LAB — GLUCOSE, CAPILLARY
Glucose-Capillary: 76 mg/dL (ref 70–99)
Glucose-Capillary: 105 mg/dL — ABNORMAL HIGH (ref 70–99)
Glucose-Capillary: 67 mg/dL — ABNORMAL LOW (ref 70–99)
Glucose-Capillary: 106 mg/dL — ABNORMAL HIGH (ref 70–99)
Glucose-Capillary: 85 mg/dL (ref 70–99)
Glucose-Capillary: 62 mg/dL — ABNORMAL LOW (ref 70–99)
Glucose-Capillary: 113 mg/dL — ABNORMAL HIGH (ref 70–99)

## 2020-08-03 LAB — CULTURE, BLOOD (ROUTINE X 2): Special Requests: ADEQUATE

## 2020-08-03 MED ORDER — INSULIN ASPART 100 UNIT/ML ~~LOC~~ SOLN
0.0000 [IU] | SUBCUTANEOUS | Status: DC
  Administered 2020-08-05: 3 [IU] via SUBCUTANEOUS
  Administered 2020-08-05: 2 [IU] via SUBCUTANEOUS
  Administered 2020-08-05 – 2020-08-06 (×3): 3 [IU] via SUBCUTANEOUS
  Administered 2020-08-06 – 2020-08-08 (×5): 2 [IU] via SUBCUTANEOUS
  Administered 2020-08-08: 3 [IU] via SUBCUTANEOUS
  Administered 2020-08-08: 2 [IU] via SUBCUTANEOUS
  Administered 2020-08-08: 3 [IU] via SUBCUTANEOUS
  Administered 2020-08-08: 2 [IU] via SUBCUTANEOUS
  Administered 2020-08-09: 1 [IU] via SUBCUTANEOUS
  Administered 2020-08-09: 2 [IU] via SUBCUTANEOUS
  Administered 2020-08-09: 3 [IU] via SUBCUTANEOUS
  Administered 2020-08-09: 1 [IU] via SUBCUTANEOUS
  Administered 2020-08-09 (×2): 2 [IU] via SUBCUTANEOUS
  Administered 2020-08-09: 1 [IU] via SUBCUTANEOUS
  Administered 2020-08-10: 5 [IU] via SUBCUTANEOUS
  Administered 2020-08-10: 3 [IU] via SUBCUTANEOUS
  Administered 2020-08-10 (×2): 5 [IU] via SUBCUTANEOUS
  Administered 2020-08-10: 2 [IU] via SUBCUTANEOUS
  Administered 2020-08-11: 1 [IU] via SUBCUTANEOUS
  Administered 2020-08-11: 2 [IU] via SUBCUTANEOUS
  Administered 2020-08-11: 3 [IU] via SUBCUTANEOUS
  Administered 2020-08-11: 1 [IU] via SUBCUTANEOUS

## 2020-08-03 MED ORDER — OXYCODONE HCL 5 MG PO TABS: 5.0000 mg | ORAL_TABLET | Freq: Once | ORAL | Status: DC

## 2020-08-03 MED ORDER — MORPHINE SULFATE (PF) 2 MG/ML IV SOLN
2.0000 mg | INTRAVENOUS | Status: DC | PRN
  Administered 2020-08-04 (×2): 2 mg via INTRAVENOUS
  Filled 2020-08-03 (×2): qty 1

## 2020-08-03 MED ORDER — DEXTROSE 50 % IV SOLN
INTRAVENOUS | Status: AC
  Administered 2020-08-03: 25 mL
  Filled 2020-08-03: qty 50

## 2020-08-03 MED ORDER — INSULIN GLARGINE 100 UNIT/ML ~~LOC~~ SOLN
5.0000 [IU] | Freq: Every day | SUBCUTANEOUS | Status: DC
  Filled 2020-08-03: qty 0.05

## 2020-08-03 NOTE — Progress Notes (Signed)
Hypoglycemic Event  CBG: 67  Treatment: 4 oz juice/soda  Symptoms: None  Follow-up CBG: Time:1207 CBG Result:105  Possible Reasons for Event: Inadequate meal intake  Comments/MD notified:Yes    Jyla Hopf G David Rodriquez

## 2020-08-03 NOTE — Progress Notes (Addendum)
PROGRESS NOTE  Ivan Parks  JYN:[id number] DOB: 04-Jul-1940 DOA: August 17, 2020 PCP: Laurann Montana, MD   Brief Narrative: Ivan Parks is an 80 y.o. male with a history of T2DM, chronic HFpEF, venous insufficiency, stage IIIb CKD, 2L O2-dependent COPD, cognitive deficits, and right inguinal hernia who presented to the ED for the 2nd time in the past month with right groin pain, poor appetite. He demonstrated a leukocytosis to 15.7k and AKI with creatinine of 1.9 from baseline of 1.3. Hydrocele seen on scrotum U/S without torsion. CT renal stone study without acute abnormality to explain pain. Exam benign. Patient was admitted with pelvic U/S pending this AM. Left buttock wound was noted with induration and no abscess on exam or imaging for which surgery was consulted (no I&D recommended) and ancef was started. Blood culture has resulted with MRSA for which ID was consulted and antibiotic changed to daptomycin. TTE without vegetation. TEE planned 08/05/2020.   Assessment & Plan: Principal Problem:   MRSA bacteremia Active Problems:   COPD, severe (HCC)   Leg edema, right- Greater than Left leg   Essential hypertension   Acute kidney injury (nontraumatic) (HCC)   CHF (congestive heart failure) (HCC)   Abdominal pain   Controlled type 2 diabetes mellitus with hyperglycemia (HCC)   Pressure injury of skin  Hydrocele, right groin pain, right inguinal hernia: Hernia is reducible, though may be contributing to some mild discomfort.  - Supportive care - Pt apparently has surgical follow up planned for hernia management.  Cellulitis, left buttock wound, MRSA bacteremia: No fluctuance on palpation. No debridement recommended by surgery.  - Continue daptomycin per ID. TTE negative for vegetation, repeat blood cultures obtained 3/18 AM. TEE scheduled for 3/21. - Continue local wound care per surgery/WOC.  - Monitor blood culture data.  - Restart home tramadol for pain control. - Tmax 100.5  overnight, but no other vital sign instability. Repeat cultures if recurrently febrile. Check CBC in AM.   AKI on stage IIIb CKD: Improving. Baseline SCr 1.3-1.5. 1.94 on admission.  - Hold ACE and diuretic. Does not appear to be overloaded.  - Recheck BMP in AM.  Chronic HFpEF: LVEF preserved, though G2DD and dilated IVC on TTE 3/17. Given a dose of lasix due to leg swelling, though Cr bumped so will not continue this at this time with limited po intake. - Hold lasix again today, ACEi with AKI as above.   Venous insufficiency: No DVT on U/S - Elevate legs.  COPD: Severe.  - Continue supplemental oxygen, does not appear to be in exacerbation.   HTN: Continue coreg.   Cognitive impairment, debility:  - PT/OT evaluations. Has assistance with wife, son, step son and daughter but has grown less independent of late, likely as result of brewing illness. - Continue aricept  T2DM: HbA1c down to 8.7% from 10.9%. CBGs well controlled. - Continue CBGs AC/HS and SSI. Decrease lantus to 5u daily.   DVT prophylaxis: Lovenox Code Status: Full Family Communication: None at bedside. Spoke to wife 3/18, planning to call after TEE 3/21 unless status changes. Disposition Plan:  Status is: Inpatient  Remains inpatient appropriate because:Ongoing diagnostic testing needed not appropriate for outpatient work up, Unsafe d/c plan and IV treatments appropriate due to intensity of illness or inability to take PO  Dispo: The patient is from: Home              Anticipated d/c is to: PT/OT consults pending - anticipate SNF rehab  Patient currently is not medically stable to d/c.   Difficult to place patient No  Consultants:   Surgery  ID  Procedures:   None  Antimicrobials:  Ancef 3/16 - 3/17  Daptomycin 3/17 >>   Subjective: Feels tired this morning and declines exam of left buttock. Not eating much at lunch yesterday, some asymptomatic hypoglycemia. 100.5 Tmax overnight  without fever, chills subjectively or VS changes. Pain in left buttock stable.   Objective: Vitals:   08/02/20 1622 08/02/20 2053 08/03/20 0356 08/03/20 0933  BP: 122/73 (!) 100/54 92/62 101/66  Pulse: 80 75 73 76  Resp: 17 18 18 18   Temp: 100.3 F (37.9 C) (!) 100.4 F (38 C) (!) 97.4 F (36.3 C) 97.8 F (36.6 C)  TempSrc: Oral Oral Oral Oral  SpO2: 99% 100% 94%   Weight:      Height:        Intake/Output Summary (Last 24 hours) at 08/03/2020 1650 Last data filed at 08/03/2020 0900 Gross per 24 hour  Intake 360 ml  Output 750 ml  Net -390 ml   Filed Weights   08/14/2020 2122  Weight: 71.2 kg   Gen: [age] y.o. male in no distress Pulm: Nonlabored breathing room air. Clear. CV: Regular rate and rhythm. No murmur, rub, or gallop. No JVD, stable dependent edema. GI: Abdomen soft, non-tender, non-distended, with normoactive bowel sounds.  Ext: Warm, no deformities Skin: No new rashes, lesions or ulcers on visualized skin. Pt declined full exam. Neuro: Alert and oriented. No focal neurological deficits. Psych: Judgement and insight appear marginal. Mood euthymic & affect congruent. Behavior is appropriate.    Data Reviewed: I have personally reviewed following labs and imaging studies  CBC: Recent Labs  Lab 08/11/2020 2151 07/31/20 0550 08/01/20 0251 08/02/20 0557  WBC 15.7* 15.7* 15.1* 16.5*  NEUTROABS  --  13.0*  --  13.9*  HGB 13.2 12.5* 11.4* 12.1*  HCT 44.3 40.7 37.5* 40.1  MCV 91.0 89.1 90.8 91.6  PLT 227 190 192 186   Basic Metabolic Panel: Recent Labs  Lab 08/08/2020 2151 07/31/20 0550 08/01/20 0251 08/02/20 0557  NA 134* 134* 135 135  K 3.9 3.6 3.3* 3.9  CL 96* 97* 96* 98  CO2 28 27 32 30  GLUCOSE 209* 154* 81 86  BUN 24* 21 19 25*  CREATININE 1.94* 1.64* 1.55* 1.84*  CALCIUM 8.7* 8.2* 8.3* 8.0*   GFR: Estimated Creatinine Clearance: 27.9 mL/min (A) (by C-G formula based on SCr of 1.84 mg/dL (H)). Liver Function Tests: Recent Labs  Lab  07/19/2020 2151 07/31/20 0550  AST 20 19  ALT 11 11  ALKPHOS 73 60  BILITOT 1.0 1.0  PROT 7.7 6.8  ALBUMIN 2.7* 2.3*   Cardiac Enzymes: Recent Labs  Lab 08/01/20 1026  CKTOTAL 340   Urine analysis:    Component Value Date/Time   COLORURINE YELLOW 07/31/2020 2033   APPEARANCEUR CLEAR 07/31/2020 2033   LABSPEC 1.015 07/31/2020 2033   PHURINE 5.0 07/31/2020 2033   GLUCOSEU NEGATIVE 07/31/2020 2033   HGBUR MODERATE (A) 07/31/2020 2033   BILIRUBINUR NEGATIVE 07/31/2020 2033   KETONESUR NEGATIVE 07/31/2020 2033   PROTEINUR 100 (A) 07/31/2020 2033   UROBILINOGEN 0.2 09/19/2011 1701   NITRITE NEGATIVE 07/31/2020 2033   LEUKOCYTESUR NEGATIVE 07/31/2020 2033   Recent Results (from the past 240 hour(s))  SARS CORONAVIRUS 2 (TAT 6-24 HRS) Nasopharyngeal Nasopharyngeal Swab     Status: None   Collection Time: 07/31/20  3:01 AM   Specimen: Nasopharyngeal  Swab  Result Value Ref Range Status   SARS Coronavirus 2 NEGATIVE NEGATIVE Final    Comment: (NOTE) SARS-CoV-2 target nucleic acids are NOT DETECTED.  The SARS-CoV-2 RNA is generally detectable in upper and lower respiratory specimens during the acute phase of infection. Negative results do not preclude SARS-CoV-2 infection, do not rule out co-infections with other pathogens, and should not be used as the sole basis for treatment or other patient management decisions. Negative results must be combined with clinical observations, patient history, and epidemiological information. The expected result is Negative.  Fact Sheet for Patients: HairSlick.no  Fact Sheet for Healthcare Providers: quierodirigir.com  This test is not yet approved or cleared by the [place] FDA and  has been authorized for detection and/or diagnosis of SARS-CoV-2 by FDA under an Emergency Use Authorization (EUA). This EUA will remain  in effect (meaning this test can be used) for the duration of  the COVID-19 declaration under Se ction 564(b)(1) of the Act, 21 U.S.C. section 360bbb-3(b)(1), unless the authorization is terminated or revoked sooner.  Performed at [facility], [address] [place], [place] [address]   Culture, blood (routine x 2)     Status: Abnormal   Collection Time: 07/31/20  8:01 AM   Specimen: BLOOD LEFT HAND  Result Value Ref Range Status   Specimen Description BLOOD LEFT HAND  Final   Special Requests   Final    BOTTLES DRAWN AEROBIC ONLY Blood Culture adequate volume   Culture  Setup Time   Final    AEROBIC BOTTLE ONLY GRAM POSITIVE COCCI CRITICAL RESULT CALLED TO, READ BACK BY AND VERIFIED WITH: Marissa Nestle [phone] FCP Performed at [facility], [address] [place], [place] [address]    Culture METHICILLIN RESISTANT STAPHYLOCOCCUS AUREUS (A)  Final   Report Status 08/03/2020 FINAL  Final   Organism ID, Bacteria METHICILLIN RESISTANT STAPHYLOCOCCUS AUREUS  Final      Susceptibility   Methicillin resistant staphylococcus aureus - MIC*    CIPROFLOXACIN >=8 RESISTANT Resistant     ERYTHROMYCIN >=8 RESISTANT Resistant     GENTAMICIN <=0.5 SENSITIVE Sensitive     OXACILLIN >=4 RESISTANT Resistant     TETRACYCLINE <=1 SENSITIVE Sensitive     VANCOMYCIN 1 SENSITIVE Sensitive     TRIMETH/SULFA >=320 RESISTANT Resistant     CLINDAMYCIN <=0.25 SENSITIVE Sensitive     RIFAMPIN <=0.5 SENSITIVE Sensitive     Inducible Clindamycin NEGATIVE Sensitive     * METHICILLIN RESISTANT STAPHYLOCOCCUS AUREUS  Blood Culture ID Panel (Reflexed)     Status: Abnormal   Collection Time: 07/31/20  8:01 AM  Result Value Ref Range Status   Enterococcus faecalis NOT DETECTED NOT DETECTED Final   Enterococcus Faecium NOT DETECTED NOT DETECTED Final   Listeria monocytogenes NOT DETECTED NOT DETECTED Final   Staphylococcus species DETECTED (A) NOT DETECTED Final    Comment: CRITICAL RESULT CALLED TO, READ BACK BY AND VERIFIED WITH: Marissa Nestle  [phone] FCP    Staphylococcus aureus (BCID) DETECTED (A) NOT DETECTED Final    Comment: Methicillin (oxacillin)-resistant Staphylococcus aureus (MRSA). MRSA is predictably resistant to beta-lactam antibiotics (except ceftaroline). Preferred therapy is vancomycin unless clinically contraindicated. Patient requires contact precautions if  hospitalized. CRITICAL RESULT CALLED TO, READ BACK BY AND VERIFIED WITH: Marissa Nestle [phone] FCP    Staphylococcus epidermidis NOT DETECTED NOT DETECTED Final   Staphylococcus lugdunensis NOT DETECTED NOT DETECTED Final   Streptococcus species NOT  DETECTED NOT DETECTED Final   Streptococcus agalactiae NOT DETECTED NOT DETECTED Final   Streptococcus pneumoniae NOT DETECTED NOT DETECTED Final   Streptococcus pyogenes NOT DETECTED NOT DETECTED Final   A.calcoaceticus-baumannii NOT DETECTED NOT DETECTED Final   Bacteroides fragilis NOT DETECTED NOT DETECTED Final   Enterobacterales NOT DETECTED NOT DETECTED Final   Enterobacter cloacae complex NOT DETECTED NOT DETECTED Final   Escherichia coli NOT DETECTED NOT DETECTED Final   Klebsiella aerogenes NOT DETECTED NOT DETECTED Final   Klebsiella oxytoca NOT DETECTED NOT DETECTED Final   Klebsiella pneumoniae NOT DETECTED NOT DETECTED Final   Proteus species NOT DETECTED NOT DETECTED Final   Salmonella species NOT DETECTED NOT DETECTED Final   Serratia marcescens NOT DETECTED NOT DETECTED Final   Haemophilus influenzae NOT DETECTED NOT DETECTED Final   Neisseria meningitidis NOT DETECTED NOT DETECTED Final   Pseudomonas aeruginosa NOT DETECTED NOT DETECTED Final   Stenotrophomonas maltophilia NOT DETECTED NOT DETECTED Final   Candida albicans NOT DETECTED NOT DETECTED Final   Candida auris NOT DETECTED NOT DETECTED Final   Candida glabrata NOT DETECTED NOT DETECTED Final   Candida krusei NOT DETECTED NOT DETECTED Final   Candida parapsilosis NOT DETECTED NOT DETECTED Final   Candida tropicalis NOT  DETECTED NOT DETECTED Final   Cryptococcus neoformans/gattii NOT DETECTED NOT DETECTED Final   Meth resistant mecA/C and MREJ DETECTED (A) NOT DETECTED Final    Comment: CRITICAL RESULT CALLED TO, READ BACK BY AND VERIFIED WITH: Marissa Nestle [phone] FCP Performed at [facility], [address] [place], [place] [address]   Culture, blood (routine x 2)     Status: None (Preliminary result)   Collection Time: 07/31/20  8:02 AM   Specimen: BLOOD  Result Value Ref Range Status   Specimen Description BLOOD LEFT ANTECUBITAL  Final   Special Requests   Final    BOTTLES DRAWN AEROBIC ONLY Blood Culture adequate volume   Culture   Final    NO GROWTH 3 DAYS Performed at [facility], [address], [place], [place] [address]    Report Status PENDING  Incomplete  Culture, blood (routine x 2)     Status: None (Preliminary result)   Collection Time: 08/02/20  5:57 AM   Specimen: BLOOD LEFT ARM  Result Value Ref Range Status   Specimen Description BLOOD LEFT ARM  Final   Special Requests   Final    BOTTLES DRAWN AEROBIC AND ANAEROBIC Blood Culture results may not be optimal due to an excessive volume of blood received in culture bottles   Culture   Final    NO GROWTH 1 DAY Performed at [facility], [address], [place], [place] [address]    Report Status PENDING  Incomplete  Culture, blood (routine x 2)     Status: None (Preliminary result)   Collection Time: 08/02/20  5:57 AM   Specimen: BLOOD LEFT HAND  Result Value Ref Range Status   Specimen Description BLOOD LEFT HAND  Final   Special Requests   Final    BOTTLES DRAWN AEROBIC AND ANAEROBIC Blood Culture adequate volume   Culture   Final    NO GROWTH 1 DAY Performed at [facility], [address], [place], [place] [address]    Report Status PENDING  Incomplete      Radiology Studies: No results found.  Scheduled Meds: . atorvastatin  20 mg Oral Daily  . carvedilol  12.5 mg  Oral BID WC  .  donepezil  10 mg Oral QHS  . enoxaparin (LOVENOX) injection  30 mg Subcutaneous Daily  . insulin aspart  0-9 Units Subcutaneous TID WC  . insulin glargine  15 Units Subcutaneous Daily  . pantoprazole  40 mg Oral Daily  . polyethylene glycol  17 g Oral Daily  . vitamin B-12  1,000 mcg Oral Daily   Continuous Infusions: . DAPTOmycin (CUBICIN)  IV       LOS: 2 days   Time spent: 25 minutes.  Tyrone Nine, MD [facility] [email or web address] 08/03/2020, 4:50 PM

## 2020-08-03 NOTE — Progress Notes (Signed)
10:Ivan Parks Nurse called due to patient complaining of not being able to swallow.  CBG was low at 62 and he was unable to swallow the swallow juice given, so IV D50 was given.  Patient was placed n.p.o. as a precaution to prevent aspiration.  Current insulin regimen was discontinued and patient was placed on ISS and hypoglycemic protocol.  SLP consult placed for swallow eval in the morning.  Oxycodone was discontinued and patient was placed on IV morphine.

## 2020-08-04 DIAGNOSIS — R109 Unspecified abdominal pain: Secondary | ICD-10-CM | POA: Diagnosis not present

## 2020-08-04 DIAGNOSIS — I5032 Chronic diastolic (congestive) heart failure: Secondary | ICD-10-CM | POA: Diagnosis not present

## 2020-08-04 DIAGNOSIS — E1165 Type 2 diabetes mellitus with hyperglycemia: Secondary | ICD-10-CM | POA: Diagnosis not present

## 2020-08-04 DIAGNOSIS — N179 Acute kidney failure, unspecified: Secondary | ICD-10-CM | POA: Diagnosis not present

## 2020-08-04 LAB — GLUCOSE, CAPILLARY
Glucose-Capillary: 109 mg/dL — ABNORMAL HIGH (ref 70–99)
Glucose-Capillary: 147 mg/dL — ABNORMAL HIGH (ref 70–99)
Glucose-Capillary: 154 mg/dL — ABNORMAL HIGH (ref 70–99)
Glucose-Capillary: 60 mg/dL — ABNORMAL LOW (ref 70–99)
Glucose-Capillary: 84 mg/dL (ref 70–99)
Glucose-Capillary: 98 mg/dL (ref 70–99)
Glucose-Capillary: 99 mg/dL (ref 70–99)

## 2020-08-04 LAB — CBC
HCT: 40 % (ref 39.0–52.0)
Hemoglobin: 12.3 g/dL — ABNORMAL LOW (ref 13.0–17.0)
MCH: 28 pg (ref 26.0–34.0)
MCHC: 30.8 g/dL (ref 30.0–36.0)
MCV: 90.9 fL (ref 80.0–100.0)
Platelets: 210 10*3/uL (ref 150–400)
RBC: 4.4 MIL/uL (ref 4.22–5.81)
RDW: 12.4 % (ref 11.5–15.5)
WBC: 18.4 10*3/uL — ABNORMAL HIGH (ref 4.0–10.5)
nRBC: 0 % (ref 0.0–0.2)

## 2020-08-04 LAB — BASIC METABOLIC PANEL
Anion gap: 8 (ref 5–15)
BUN: 33 mg/dL — ABNORMAL HIGH (ref 8–23)
CO2: 32 mmol/L (ref 22–32)
Calcium: 8.5 mg/dL — ABNORMAL LOW (ref 8.9–10.3)
Chloride: 96 mmol/L — ABNORMAL LOW (ref 98–111)
Creatinine, Ser: 2.07 mg/dL — ABNORMAL HIGH (ref 0.61–1.24)
GFR, Estimated: 32 mL/min — ABNORMAL LOW (ref >60)
Glucose, Bld: 77 mg/dL (ref 70–99)
Potassium: 4.6 mmol/L (ref 3.5–5.1)
Sodium: 136 mmol/L (ref 135–145)

## 2020-08-04 LAB — CK: Total CK: 151 U/L (ref 49–397)

## 2020-08-04 MED ORDER — HYDROMORPHONE HCL 2 MG PO TABS
1.0000 mg | ORAL_TABLET | ORAL | Status: DC | PRN
  Administered 2020-08-05 – 2020-08-07 (×3): 2 mg via ORAL
  Filled 2020-08-04 (×4): qty 1

## 2020-08-04 MED ORDER — DEXTROSE 50 % IV SOLN
INTRAVENOUS | Status: AC
  Administered 2020-08-04: 50 mL
  Filled 2020-08-04: qty 50

## 2020-08-04 MED ORDER — HYDROMORPHONE HCL 1 MG/ML IJ SOLN: 0.5000 mg | INTRAMUSCULAR | Status: DC | PRN

## 2020-08-04 MED ORDER — DEXTROSE IN LACTATED RINGERS 5 % IV SOLN: INTRAVENOUS | Status: DC

## 2020-08-04 MED ORDER — LACTATED RINGERS IV BOLUS
500.0000 mL | Freq: Once | INTRAVENOUS | Status: AC
  Administered 2020-08-04: 500 mL via INTRAVENOUS

## 2020-08-04 NOTE — Progress Notes (Signed)
At beginning of shift, the patient is moaning loudly and very restless.  He is not cooperative and does not want to be touched or moved.  Per shift report, patient had refused all daytime medications except PO Ultram which was given at 2242.  Next dose not due till 2342.  Dr. Thomes Dinning called and made aware.  Order received for one time dose of Oxycodone.  In the interim, patient's CBG at 2125 was 62.  When attempting to get him to drink some apple juice, he refused and stated that he could not swallow.  He was adamant that he was unable to swallow.  I gave 1/2 AMP of D50 at 2147.  At 2240, his CBG was 113.  Dr. Thomes Dinning called and made aware.  Patient made NPO and ST Swallowing Evaluation put in.  Also, order received for CBG with Insulin coverage to change to Q4 hours.  IV Morphine 2 mg Q 4 hours put in and can be given if SBP is >110.  At 2251, BP is 100/61.  Rechecked at 0020 and was 120/58.  2 mg IV Morphine given at 0031 with no relief.  Patient is refusing to be turned or repositioned.  He is irritable at the frequent VS and CBGs.  At 0401 - his CBG was 60.  A whole AMP of D50 given at 0411.  At 0442, his CBG was 147.  His B/P at 0409, was 93/65 and was unable to give IV Morphine.  He was awake and alert.  Argumentative and resistant to repositioning at the time.  Will continue to monitor patient.  Bernie Covey RN

## 2020-08-04 NOTE — Anesthesia Preprocedure Evaluation (Addendum)
Anesthesia Evaluation  Patient identified by MRN, date of birth, ID band Patient awake    Reviewed: Allergy & Precautions, H&P , NPO status , Patient's Chart, lab work & pertinent test results, reviewed documented beta blocker date and time   History of Anesthesia Complications Negative for: history of anesthetic complications  Airway Mallampati: II   Neck ROM: full    Dental   Pulmonary sleep apnea , COPD (severe),  oxygen dependent, former smoker,    breath sounds clear to auscultation       Cardiovascular hypertension, Pt. on medications and Pt. on home beta blockers + Peripheral Vascular Disease and +CHF   Rhythm:regular Rate:Normal  TTE 07/22/20: EF 55-60%, mild LVH, grade II DD, mild to moderate AR   Neuro/Psych Dementia negative neurological ROS     GI/Hepatic Neg liver ROS, GERD  Medicated and Controlled,  Endo/Other  diabetes, Type 2, Insulin Dependent  Renal/GU ARFRenal disease (Cr 2.07)  negative genitourinary   Musculoskeletal negative musculoskeletal ROS (+)   Abdominal   Peds  Hematology  (+) anemia , Hgb 12.3   Anesthesia Other Findings Bacteremia  Reproductive/Obstetrics negative OB ROS                            Anesthesia Physical Anesthesia Plan  ASA: IV  Anesthesia Plan: MAC   Post-op Pain Management:    Induction: Intravenous  PONV Risk Score and Plan: 1 and Treatment may vary due to age or medical condition and Propofol infusion  Airway Management Planned: Natural Airway and Nasal Cannula  Additional Equipment:   Intra-op Plan:   Post-operative Plan:   Informed Consent: I have reviewed the patients History and Physical, chart, labs and discussed the procedure including the risks, benefits and alternatives for the proposed anesthesia with the patient or authorized representative who has indicated his/her understanding and acceptance.     Dental advisory  given  Plan Discussed with: CRNA, Anesthesiologist and Surgeon  Anesthesia Plan Comments:        Anesthesia Quick Evaluation

## 2020-08-04 NOTE — Progress Notes (Signed)
    [facility] has been requested to perform a transesophageal echocardiogram on Stephan Minister for bacteremia.  The patient has dementia and is currently lethargic and unable to provide consent.  After careful review of history and examination, the risks and benefits of transesophageal echocardiogram have been explained to his wife Freight forwarder - DPR on file) including risks of esophageal damage, perforation (1:10,000 risk), bleeding, pharyngeal hematoma as well as other potential complications associated with conscious sedation including aspiration, arrhythmia, respiratory failure and death. Alternatives to treatment were discussed, questions were answered. His wife is willing to allow [place] to proceed.   Tereso Newcomer, PA-C  08/04/2020 1:18 PM

## 2020-08-04 NOTE — Evaluation (Signed)
Clinical/Bedside Swallow Evaluation Patient Details  Name: Ivan Parks MRN: [id number] Date of Birth: 06/30/1940  Today's Date: 08/04/2020 Time: SLP Start Time (ACUTE ONLY): 1050 SLP Stop Time (ACUTE ONLY): 1104 SLP Time Calculation (min) (ACUTE ONLY): 14 min  Past Medical History:  Past Medical History:  Diagnosis Date  . Acute respiratory failure (HCC)   . AKI (acute kidney injury) (HCC) 04/01/2016  . Bilateral lower extremity edema   . Bronchitis   . Chest pain 12/02/2017  . CHF (congestive heart failure) (HCC)   . Chronic respiratory failure (HCC) 11/26/2014  . COPD (chronic obstructive pulmonary disease) (HCC)   . 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 Dementia (HCC)   . Diabetes (HCC) 05/02/2016  . DM (diabetes mellitus) (HCC)   . Essential hypertension 03/06/2015   hypertension   . HTN (hypertension)   . Hyperlipidemia   . Hypotension 03/31/2016  . Hypoxemia   . Leg edema, right- Greater than Left leg 07/05/2014   Lower extremity edema   . OSA (obstructive sleep apnea)   . Peripheral vascular disease (HCC)   . Tobacco abuse    Past Surgical History:  Past Surgical History:  Procedure Laterality Date  . COLON SURGERY     HPI:  80 y.o. male with a history of T2DM, chronic HFpEF, venous insufficiency, stage IIIb CKD, 2L O2-dependent severe COPD, cognitive deficits, and right inguinal hernia who presented to the ED for the 2nd time in the past month with right groin pain, poor appetite. Dx MRSA, cellulitis left buttock wound, hycrocele. The morning of 3/20 he reported difficulty swallowing to RN.  NT observed pt with POs, stating that he had no trouble swallowing per her observation.   Assessment / Plan / Recommendation Clinical Impression  Pt participated in limited swallow assessment due to his tendency to become agitated.  He  was reclined in bed - could not be encouraged to be helped to reposition, but was amenable to accepting some POs (he refused earlier today when family was present per NT). He was restless, easily aggravated, but during bouts of pleasantry he ate soft solids and drank multiple sips of water from a straw with no indication of dysphagia.  There was adequate bolus recognition, normal mastication, the appearance of a brisk swallow response, and no s/s of aspiration. No c/o of pain upon swallowing. He declined any regular solids, but given performance, there is no reason to believe he would have trouble eating a regular diet.  No dysphagia. Recommend resuming prior diet; encourage PO intake when pt is willing.  No SLP f/u is needed. Our service will sign off. SLP Visit Diagnosis: Dysphagia, unspecified (R13.10)    Aspiration Risk  No limitations    Diet Recommendation   regular solids, thin liquids  Medication Administration: Whole meds with liquid    Other  Recommendations Oral Care Recommendations: Oral care BID   Follow up Recommendations None      Frequency and Duration            Prognosis        Swallow Study   General HPI: 80 y.o. male with a history of T2DM, chronic HFpEF, venous insufficiency, stage IIIb CKD, 2L O2-dependent severe COPD, cognitive deficits, and right inguinal hernia who presented to the ED for the 2nd time in the  past month with right groin pain, poor appetite. Dx MRSA, cellulitis left buttock wound, hycrocele. The morning of 3/20 he reported difficulty swallowing to RN.  NT observed pt with POs, stating that he had no trouble swallowing per her observation. Type of Study: Bedside Swallow Evaluation Previous Swallow Assessment: no Diet Prior to this Study: Regular;Thin liquids Temperature Spikes Noted: No Respiratory Status: Nasal cannula History of Recent Intubation: No Behavior/Cognition: Alert;Uncooperative Oral Care Completed by SLP: No Oral Cavity -  Dentition: Missing dentition Self-Feeding Abilities: Needs assist;Able to feed self Patient Positioning: Partially reclined Baseline Vocal Quality: Normal Volitional Cough: Cognitively unable to elicit Volitional Swallow: Unable to elicit    Oral/Motor/Sensory Function Overall Oral Motor/Sensory Function: Within functional limits   Ice Chips Ice chips: Within functional limits   Thin Liquid Thin Liquid: Within functional limits    Nectar Thick Nectar Thick Liquid: Not tested   Honey Thick Honey Thick Liquid: Not tested   Puree Puree: Within functional limits   Solid     Solid: Not tested      [name] 08/04/2020,11:12 AM  [name], MA CCC/SLP Acute Rehabilitation Services Office number [phone] Pager [phone]

## 2020-08-04 NOTE — Progress Notes (Signed)
PROGRESS NOTE  Ivan MinisterLarry L Schimming  FAO:[id number] DOB: [birth year] DOA: 08/09/2020 PCP: Laurann MontanaWhite, [name], MD   Brief Narrative: Ivan Parks is an 80 y.o. male with a history of T2DM, chronic HFpEF, venous insufficiency, stage IIIb CKD, 2L O2-dependent COPD, cognitive deficits, and right inguinal hernia who presented to the ED for the 2nd time in the past month with right groin pain, poor appetite. He demonstrated a leukocytosis to 15.7k and AKI with creatinine of 1.9 from baseline of 1.3. Hydrocele seen on scrotum U/S without torsion. CT renal stone study without acute abnormality to explain pain. Exam benign. Patient was admitted with pelvic U/S pending this AM. Left buttock wound was noted with induration and no abscess on exam or imaging for which surgery was consulted (no I&D recommended) and ancef was started. Blood culture has resulted with MRSA for which ID was consulted and antibiotic changed to daptomycin. TTE without vegetation. TEE planned 08/05/2020.   Assessment & Plan: Principal Problem:   MRSA bacteremia Active Problems:   COPD, severe (HCC)   Leg edema, right- Greater than Left leg   Essential hypertension   Acute kidney injury (nontraumatic) (HCC)   CHF (congestive heart failure) (HCC)   Abdominal pain   Controlled type 2 diabetes mellitus with hyperglycemia (HCC)   Pressure injury of skin  Hydrocele, right groin pain, right inguinal hernia: Hernia is reducible, though may be contributing to some mild discomfort.  - Supportive care - Pt apparently has surgical follow up planned for hernia management.  Cellulitis, left buttock wound, MRSA bacteremia: No fluctuance on palpation. No debridement recommended by surgery.  - Continue daptomycin per ID. TTE negative for vegetation, repeat blood cultures obtained 3/18 AM. TEE scheduled for 3/21. - Continue local wound care per surgery/WOC. WBC up though wound itself appears to be improving. - Monitor blood culture data.  - Repeat  cultures if recurrently febrile.  AKI on stage IIIb CKD: Improved and now reworsening likely due to prerenal azotemia from decreased po intake, may be complicated by relative hypotension. Baseline SCr 1.3-1.5. 1.94 on admission.  - Bolus LR 500cc then start gtt w/dextrose.  - Hold ACE and diuretic. Legs shriveled appearing.  - Monitor BMP - Recheck CK this AM since he's on dapto and has diffuse pain  Chronic HFpEF: LVEF preserved, though G2DD and dilated IVC on TTE 3/17. Given a dose of lasix due to leg swelling, though Cr bumped so will not continue this at this time with limited po intake. - Hold lasix again today, ACEi with AKI as above.   Venous insufficiency: No DVT on U/S - Elevate legs.  COPD: Severe baseline but no exacerbation.  - Continue supplemental oxygen, does not appear to be in exacerbation.   HTN: Continue coreg.   Cognitive impairment, debility, now with acute delirium: - Mentation appearing to be more withdrawn in recent visits. Will institute delirium precautions, treat pain (change from morphine to po dilaudid given renal insufficiency), can give IV dilaudid pending SLP evaluation. No focal deficits on exam including no dysarthria so suspicion for CVA is low. Will look forward to SLP evaluation. - PT/OT evaluations. Has assistance with wife, son, step son and daughter but has grown less independent of late, likely as result of brewing illness. - Continue aricept  T2DM: HbA1c down to 8.7% from 10.9%.  - Decreased insulin, start dextrose in IVF since no po intake.   DVT prophylaxis: Lovenox 30mg  q24h Code Status: Full Family Communication: None at bedside. Spoke to wife 3/18,  planning to call after TEE 3/21 unless status changes. Disposition Plan:  Status is: Inpatient  Remains inpatient appropriate because:Ongoing diagnostic testing needed not appropriate for outpatient work up, Unsafe d/c plan and IV treatments appropriate due to intensity of illness or  inability to take PO  Dispo: The patient is from: Home              Anticipated d/c is to: PT/OT consults pending - anticipate SNF rehab              Patient currently is not medically stable to d/c.   Difficult to place patient No  Consultants:   Surgery  ID  Procedures:   None  Antimicrobials:  Ancef 3/16 - 3/17  Daptomycin 3/17 >>   Subjective: Resisted exam yesterday and becoming more withdrawn in general. Overnight events include hypoglycemia and resistance to nursing requests. He reports pain without telling me where, though voices discomfort with any palpation anywhere. Softer BPs overnight as well. Not eating or drinking. No fevers.   Objective: Vitals:   08/03/20 2251 08/04/20 0020 08/04/20 0409 08/04/20 0914  BP: 100/61 (!) 120/58 93/65 94/65   Pulse:   87 82  Resp:   19 18  Temp:   98.5 F (36.9 C) 98.4 F (36.9 C)  TempSrc:   Axillary Oral  SpO2:   96% 97%  Weight:      Height:        Intake/Output Summary (Last 24 hours) at 08/04/2020 1052 Last data filed at 08/04/2020 0900 Gross per 24 hour  Intake 233.03 ml  Output 1000 ml  Net -766.97 ml   Filed Weights   August 06, 2020 2122  Weight: 71.2 kg   Gen: Frail elderly male in no distress HEENT: Dry mucous membranes Pulm: Nonlabored breathing room air. Clear. CV: Regular rate and rhythm. No murmur, rub, or gallop. No JVD, no dependent edema. GI: Abdomen soft, non-tender, non-distended, with normoactive bowel sounds.  Ext: Warm, dry. Legs appear shriveled.  Skin: No new rashes, lesions or ulcers on visualized skin. Left buttock cellulitis has decreased area and intensity of induration still with soft eschar slightly larger than before, still no fluctuance.  Neuro: Alert and oriented. Moves all extremities without focal neurological deficits. No dysarthria.  Psych: Judgement and insight appear impaired. Mood withdrawn & affect congruent.    Data Reviewed: I have personally reviewed following labs and  imaging studies  CBC: Recent Labs  Lab 06-Aug-2020 2151 07/31/20 0550 08/01/20 0251 08/02/20 0557 08/04/20 0304  WBC 15.7* 15.7* 15.1* 16.5* 18.4*  NEUTROABS  --  13.0*  --  13.9*  --   HGB 13.2 12.5* 11.4* 12.1* 12.3*  HCT 44.3 40.7 37.5* 40.1 40.0  MCV 91.0 89.1 90.8 91.6 90.9  PLT 227 190 192 186 210   Basic Metabolic Panel: Recent Labs  Lab August 06, 2020 2151 07/31/20 0550 08/01/20 0251 08/02/20 0557 08/04/20 0304  NA 134* 134* 135 135 136  K 3.9 3.6 3.3* 3.9 4.6  CL 96* 97* 96* 98 96*  CO2 28 27 32 30 32  GLUCOSE 209* 154* 81 86 77  BUN 24* 21 19 25* 33*  CREATININE 1.94* 1.64* 1.55* 1.84* 2.07*  CALCIUM 8.7* 8.2* 8.3* 8.0* 8.5*   GFR: Estimated Creatinine Clearance: 24.8 mL/min (A) (by C-G formula based on SCr of 2.07 mg/dL (H)). Liver Function Tests: Recent Labs  Lab August 06, 2020 2151 07/31/20 0550  AST 20 19  ALT 11 11  ALKPHOS 73 60  BILITOT 1.0 1.0  PROT 7.7  6.8  ALBUMIN 2.7* 2.3*   Cardiac Enzymes: Recent Labs  Lab 08/01/20 1026  CKTOTAL 340   Urine analysis:    Component Value Date/Time   COLORURINE YELLOW 07/31/2020 2033   APPEARANCEUR CLEAR 07/31/2020 2033   LABSPEC 1.015 07/31/2020 2033   PHURINE 5.0 07/31/2020 2033   GLUCOSEU NEGATIVE 07/31/2020 2033   HGBUR MODERATE (A) 07/31/2020 2033   BILIRUBINUR NEGATIVE 07/31/2020 2033   KETONESUR NEGATIVE 07/31/2020 2033   PROTEINUR 100 (A) 07/31/2020 2033   UROBILINOGEN 0.2 09/19/2011 1701   NITRITE NEGATIVE 07/31/2020 2033   LEUKOCYTESUR NEGATIVE 07/31/2020 2033   Recent Results (from the past 240 hour(s))  SARS CORONAVIRUS 2 (TAT 6-24 HRS) Nasopharyngeal Nasopharyngeal Swab     Status: None   Collection Time: 07/31/20  3:01 AM   Specimen: Nasopharyngeal Swab  Result Value Ref Range Status   SARS Coronavirus 2 NEGATIVE NEGATIVE Final    Comment: (NOTE) SARS-CoV-2 target nucleic acids are NOT DETECTED.  The SARS-CoV-2 RNA is generally detectable in upper and lower respiratory specimens during  the acute phase of infection. Negative results do not preclude SARS-CoV-2 infection, do not rule out co-infections with other pathogens, and should not be used as the sole basis for treatment or other patient management decisions. Negative results must be combined with clinical observations, patient history, and epidemiological information. The expected result is Negative.  Fact Sheet for Patients: HairSlick.no  Fact Sheet for Healthcare Providers: quierodirigir.com  This test is not yet approved or cleared by the [place] FDA and  has been authorized for detection and/or diagnosis of SARS-CoV-2 by FDA under an Emergency Use Authorization (EUA). This EUA will remain  in effect (meaning this test can be used) for the duration of the COVID-19 declaration under Se ction 564(b)(1) of the Act, 21 U.S.C. section 360bbb-3(b)(1), unless the authorization is terminated or revoked sooner.  Performed at [facility], [address], [place], [place] [address]   Culture, blood (routine x 2)     Status: Abnormal   Collection Time: 07/31/20  8:01 AM   Specimen: BLOOD LEFT HAND  Result Value Ref Range Status   Specimen Description BLOOD LEFT HAND  Final   Special Requests   Final    BOTTLES DRAWN AEROBIC ONLY Blood Culture adequate volume   Culture  Setup Time   Final    AEROBIC BOTTLE ONLY GRAM POSITIVE COCCI CRITICAL RESULT CALLED TO, READ BACK BY AND VERIFIED WITH: Marissa Nestle [phone] FCP Performed at [facility], [address] [place], [place] [address]    Culture METHICILLIN RESISTANT STAPHYLOCOCCUS AUREUS (A)  Final   Report Status 08/03/2020 FINAL  Final   Organism ID, Bacteria METHICILLIN RESISTANT STAPHYLOCOCCUS AUREUS  Final      Susceptibility   Methicillin resistant staphylococcus aureus - MIC*    CIPROFLOXACIN >=8 RESISTANT Resistant     ERYTHROMYCIN >=8 RESISTANT Resistant     GENTAMICIN <=0.5  SENSITIVE Sensitive     OXACILLIN >=4 RESISTANT Resistant     TETRACYCLINE <=1 SENSITIVE Sensitive     VANCOMYCIN 1 SENSITIVE Sensitive     TRIMETH/SULFA >=320 RESISTANT Resistant     CLINDAMYCIN <=0.25 SENSITIVE Sensitive     RIFAMPIN <=0.5 SENSITIVE Sensitive     Inducible Clindamycin NEGATIVE Sensitive     * METHICILLIN RESISTANT STAPHYLOCOCCUS AUREUS  Blood Culture ID Panel (Reflexed)     Status: Abnormal   Collection Time: 07/31/20  8:01 AM  Result Value Ref Range Status  Enterococcus faecalis NOT DETECTED NOT DETECTED Final   Enterococcus Faecium NOT DETECTED NOT DETECTED Final   Listeria monocytogenes NOT DETECTED NOT DETECTED Final   Staphylococcus species DETECTED (A) NOT DETECTED Final    Comment: CRITICAL RESULT CALLED TO, READ BACK BY AND VERIFIED WITH: Marissa Nestle [phone] FCP    Staphylococcus aureus (BCID) DETECTED (A) NOT DETECTED Final    Comment: Methicillin (oxacillin)-resistant Staphylococcus aureus (MRSA). MRSA is predictably resistant to beta-lactam antibiotics (except ceftaroline). Preferred therapy is vancomycin unless clinically contraindicated. Patient requires contact precautions if  hospitalized. CRITICAL RESULT CALLED TO, READ BACK BY AND VERIFIED WITH: PHARMD THOMAS J [phone] FCP    Staphylococcus epidermidis NOT DETECTED NOT DETECTED Final   Staphylococcus lugdunensis NOT DETECTED NOT DETECTED Final   Streptococcus species NOT DETECTED NOT DETECTED Final   Streptococcus agalactiae NOT DETECTED NOT DETECTED Final   Streptococcus pneumoniae NOT DETECTED NOT DETECTED Final   Streptococcus pyogenes NOT DETECTED NOT DETECTED Final   A.calcoaceticus-baumannii NOT DETECTED NOT DETECTED Final   Bacteroides fragilis NOT DETECTED NOT DETECTED Final   Enterobacterales NOT DETECTED NOT DETECTED Final   Enterobacter cloacae complex NOT DETECTED NOT DETECTED Final   Escherichia coli NOT DETECTED NOT DETECTED Final   Klebsiella aerogenes NOT DETECTED  NOT DETECTED Final   Klebsiella oxytoca NOT DETECTED NOT DETECTED Final   Klebsiella pneumoniae NOT DETECTED NOT DETECTED Final   Proteus species NOT DETECTED NOT DETECTED Final   Salmonella species NOT DETECTED NOT DETECTED Final   Serratia marcescens NOT DETECTED NOT DETECTED Final   Haemophilus influenzae NOT DETECTED NOT DETECTED Final   Neisseria meningitidis NOT DETECTED NOT DETECTED Final   Pseudomonas aeruginosa NOT DETECTED NOT DETECTED Final   Stenotrophomonas maltophilia NOT DETECTED NOT DETECTED Final   Candida albicans NOT DETECTED NOT DETECTED Final   Candida auris NOT DETECTED NOT DETECTED Final   Candida glabrata NOT DETECTED NOT DETECTED Final   Candida krusei NOT DETECTED NOT DETECTED Final   Candida parapsilosis NOT DETECTED NOT DETECTED Final   Candida tropicalis NOT DETECTED NOT DETECTED Final   Cryptococcus neoformans/gattii NOT DETECTED NOT DETECTED Final   Meth resistant mecA/C and MREJ DETECTED (A) NOT DETECTED Final    Comment: CRITICAL RESULT CALLED TO, READ BACK BY AND VERIFIED WITH: Marissa Nestle [phone] FCP Performed at [facility], [address] [place], [place] [address]   Culture, blood (routine x 2)     Status: None (Preliminary result)   Collection Time: 07/31/20  8:02 AM   Specimen: BLOOD  Result Value Ref Range Status   Specimen Description BLOOD LEFT ANTECUBITAL  Final   Special Requests   Final    BOTTLES DRAWN AEROBIC ONLY Blood Culture adequate volume   Culture   Final    NO GROWTH 3 DAYS Performed at [facility], [address], [place], [place] [address]    Report Status PENDING  Incomplete  Culture, blood (routine x 2)     Status: None (Preliminary result)   Collection Time: 08/02/20  5:57 AM   Specimen: BLOOD LEFT ARM  Result Value Ref Range Status   Specimen Description BLOOD LEFT ARM  Final   Special Requests   Final    BOTTLES DRAWN AEROBIC AND ANAEROBIC Blood Culture results may not be optimal due to an  excessive volume of blood received in culture bottles   Culture   Final    NO GROWTH 1 DAY Performed at [facility], [address] [place], [place] [address]    Report Status PENDING  Incomplete  Culture, blood (routine x 2)     Status: None (Preliminary result)   Collection Time: 08/02/20  5:57 AM   Specimen: BLOOD LEFT HAND  Result Value Ref Range Status   Specimen Description BLOOD LEFT HAND  Final   Special Requests   Final    BOTTLES DRAWN AEROBIC AND ANAEROBIC Blood Culture adequate volume   Culture   Final    NO GROWTH 1 DAY Performed at [facility], [address] [place], [place] [address]    Report Status PENDING  Incomplete      Radiology Studies: No results found.  Scheduled Meds: . atorvastatin  20 mg Oral Daily  . carvedilol  12.5 mg Oral BID WC  . donepezil  10 mg Oral QHS  . enoxaparin (LOVENOX) injection  30 mg Subcutaneous Daily  . insulin aspart  0-9 Units Subcutaneous Q4H  . pantoprazole  40 mg Oral Daily  . polyethylene glycol  17 g Oral Daily  . vitamin B-12  1,000 mcg Oral Daily   Continuous Infusions: . DAPTOmycin (CUBICIN)  IV 650 mg (08/03/20 2149)  . dextrose 5% lactated ringers    . lactated ringers       LOS: 3 days   Time spent: 35 minutes.  Tyrone Nine, MD [facility] [email or web address] 08/04/2020, 10:52 AM

## 2020-08-04 NOTE — Progress Notes (Signed)
Grandaughter at the bedside phone number [phone]  Daughter Little [phone]  Family would like to be notified if anything changes with patient.

## 2020-08-05 DIAGNOSIS — E1165 Type 2 diabetes mellitus with hyperglycemia: Secondary | ICD-10-CM | POA: Diagnosis not present

## 2020-08-05 DIAGNOSIS — J449 Chronic obstructive pulmonary disease, unspecified: Secondary | ICD-10-CM | POA: Diagnosis not present

## 2020-08-05 DIAGNOSIS — E785 Hyperlipidemia, unspecified: Secondary | ICD-10-CM | POA: Diagnosis not present

## 2020-08-05 DIAGNOSIS — E1151 Type 2 diabetes mellitus with diabetic peripheral angiopathy without gangrene: Secondary | ICD-10-CM | POA: Diagnosis not present

## 2020-08-05 DIAGNOSIS — K219 Gastro-esophageal reflux disease without esophagitis: Secondary | ICD-10-CM | POA: Diagnosis not present

## 2020-08-05 DIAGNOSIS — R109 Unspecified abdominal pain: Secondary | ICD-10-CM | POA: Diagnosis not present

## 2020-08-05 DIAGNOSIS — N179 Acute kidney failure, unspecified: Secondary | ICD-10-CM | POA: Diagnosis not present

## 2020-08-05 DIAGNOSIS — I10 Essential (primary) hypertension: Secondary | ICD-10-CM | POA: Diagnosis not present

## 2020-08-05 DIAGNOSIS — I5032 Chronic diastolic (congestive) heart failure: Secondary | ICD-10-CM | POA: Diagnosis not present

## 2020-08-05 DIAGNOSIS — J441 Chronic obstructive pulmonary disease with (acute) exacerbation: Secondary | ICD-10-CM | POA: Diagnosis not present

## 2020-08-05 LAB — CBC
HCT: 40.3 % (ref 39.0–52.0)
Hemoglobin: 12.5 g/dL — ABNORMAL LOW (ref 13.0–17.0)
MCH: 28 pg (ref 26.0–34.0)
MCHC: 31 g/dL (ref 30.0–36.0)
MCV: 90.2 fL (ref 80.0–100.0)
Platelets: 243 10*3/uL (ref 150–400)
RBC: 4.47 MIL/uL (ref 4.22–5.81)
RDW: 12.4 % (ref 11.5–15.5)
WBC: 15.1 10*3/uL — ABNORMAL HIGH (ref 4.0–10.5)
nRBC: 0 % (ref 0.0–0.2)

## 2020-08-05 LAB — BASIC METABOLIC PANEL
Anion gap: 8 (ref 5–15)
BUN: 39 mg/dL — ABNORMAL HIGH (ref 8–23)
CO2: 32 mmol/L (ref 22–32)
Calcium: 8.7 mg/dL — ABNORMAL LOW (ref 8.9–10.3)
Chloride: 98 mmol/L (ref 98–111)
Creatinine, Ser: 1.87 mg/dL — ABNORMAL HIGH (ref 0.61–1.24)
GFR, Estimated: 36 mL/min — ABNORMAL LOW (ref >60)
Glucose, Bld: 232 mg/dL — ABNORMAL HIGH (ref 70–99)
Potassium: 4.6 mmol/L (ref 3.5–5.1)
Sodium: 138 mmol/L (ref 135–145)

## 2020-08-05 LAB — GLUCOSE, CAPILLARY
Glucose-Capillary: 177 mg/dL — ABNORMAL HIGH (ref 70–99)
Glucose-Capillary: 193 mg/dL — ABNORMAL HIGH (ref 70–99)
Glucose-Capillary: 206 mg/dL — ABNORMAL HIGH (ref 70–99)
Glucose-Capillary: 207 mg/dL — ABNORMAL HIGH (ref 70–99)
Glucose-Capillary: 220 mg/dL — ABNORMAL HIGH (ref 70–99)

## 2020-08-05 LAB — CULTURE, BLOOD (ROUTINE X 2)
Culture: NO GROWTH
Special Requests: ADEQUATE

## 2020-08-05 MED ORDER — VANCOMYCIN HCL 750 MG/150ML IV SOLN
750.0000 mg | INTRAVENOUS | Status: DC
  Administered 2020-08-05 – 2020-08-10 (×6): 750 mg via INTRAVENOUS
  Filled 2020-08-05 (×6): qty 150

## 2020-08-05 MED ORDER — OLANZAPINE 5 MG PO TABS
5.0000 mg | ORAL_TABLET | Freq: Every day | ORAL | Status: DC
  Administered 2020-08-05 – 2020-08-10 (×6): 5 mg via ORAL
  Filled 2020-08-05 (×7): qty 1

## 2020-08-05 MED ORDER — HALOPERIDOL 1 MG PO TABS
2.0000 mg | ORAL_TABLET | Freq: Three times a day (TID) | ORAL | Status: DC | PRN
  Administered 2020-08-05 – 2020-08-08 (×2): 2 mg via ORAL
  Filled 2020-08-05 (×3): qty 2

## 2020-08-05 MED ORDER — HALOPERIDOL LACTATE 5 MG/ML IJ SOLN
2.0000 mg | Freq: Four times a day (QID) | INTRAMUSCULAR | Status: DC | PRN
  Filled 2020-08-05: qty 1

## 2020-08-05 MED ORDER — HALOPERIDOL LACTATE 5 MG/ML IJ SOLN
2.0000 mg | Freq: Three times a day (TID) | INTRAMUSCULAR | Status: DC | PRN
  Filled 2020-08-05: qty 1

## 2020-08-05 NOTE — Progress Notes (Signed)
    [facility] has been requested to perform a transesophageal echocardiogram on Ivan Parks for bacteremia.  After careful review of history and examination, the risks and benefits of transesophageal echocardiogram have been explained including risks of esophageal damage, perforation (1:10,000 risk), bleeding, pharyngeal hematoma as well as other potential complications associated with conscious sedation including aspiration, arrhythmia, respiratory failure and death. Alternatives to treatment were discussed, questions were answered. Patient is willing to proceed.   Pt is scheduled for TEE tomorrow Sep 05, 2020 at 1100 am with Dr. Avyaan Summer Salvia. I have spoken with is wife who continues to agree with the plan.   Ivan Parks, [place]  08/05/2020 4:17 PM

## 2020-08-05 NOTE — Progress Notes (Signed)
PROGRESS NOTE  Ivan Parks  BFX:[id number] DOB: [birth year] DOA: 13-Aug-2020 PCP: Laurann Montana, MD   Brief Narrative: Ivan Parks is an 80 y.o. male with a history of T2DM, chronic HFpEF, venous insufficiency, stage IIIb CKD, 2L O2-dependent COPD, cognitive deficits, and right inguinal hernia who presented to the ED for the 2nd time in the past month with right groin pain, poor appetite. He demonstrated a leukocytosis to 15.7k and AKI with creatinine of 1.9 from baseline of 1.3. Hydrocele seen on scrotum U/S without torsion. CT renal stone study without acute abnormality to explain pain. Exam benign. Patient was admitted with pelvic U/S pending this AM. Left buttock wound was noted with induration and no abscess on exam or imaging for which surgery was consulted (no I&D recommended) and ancef was started. Blood culture has resulted with MRSA for which ID was consulted and antibiotic changed to daptomycin. TTE without vegetation. TEE planned 08/05/2020.   Assessment & Plan: Principal Problem:   MRSA bacteremia Active Problems:   COPD, severe (HCC)   Leg edema, right- Greater than Left leg   Essential hypertension   Acute kidney injury (nontraumatic) (HCC)   CHF (congestive heart failure) (HCC)   Abdominal pain   Controlled type 2 diabetes mellitus with hyperglycemia (HCC)   Pressure injury of skin   Cellulitis, left buttock wound, MRSA bacteremia: No fluctuance on palpation. No debridement recommended by surgery.  - Continue daptomycin per ID. TTE negative for vegetation, repeat blood cultures obtained 3/18 are NGx3D. TEE scheduled for 3/21. - Continue local wound care per surgery/WOC.  - Monitor blood culture data.  - Repeat cultures if recurrently febrile.  AKI on stage IIIb CKD: Improved and now reworsening likely due to prerenal azotemia from decreased po intake, may be complicated by relative hypotension. Baseline SCr 1.3-1.5. 1.94 on admission.  - Continue IVF. - Hold ACE and  diuretic. Legs shriveled appearing.  - Monitor BMP - CK wnl  Chronic HFpEF: LVEF preserved, though G2DD and dilated IVC on TTE 3/17. Given a dose of lasix due to leg swelling, though Cr bumped so will not continue this at this time with limited po intake. - Holding diuretic, started IVF, will decrease rate. PO intake is nearly nothing and this may or may not be due to acute issues. Pt's family reports steady decline of late. Hold lasix again today, ACEi with AKI as above.   Hydrocele, right groin pain, right inguinal hernia: Hernia is reducible, though may be contributing to some mild discomfort.  - Supportive care - Pt apparently has surgical follow up planned for hernia management.  Venous insufficiency: No DVT on U/S - Elevate legs.  COPD: Severe baseline but no exacerbation.  - Continue supplemental oxygen   HTN: Continue coreg.   Cognitive impairment, debility, now with acute delirium: - Mentation appearing to be more withdrawn in recent visits with some agitation and even combative but for me has been redirectable. Continue delirium precautions, treating pain (change from morphine to po dilaudid given renal insufficiency), can give IV dilaudid if not taking po.   - PT/OT evaluations. Has assistance with wife, son, step son and daughter but has grown less independent of late.  - Will consult palliative care for assistance with goals of care discussions going forward. Since SNF is recommended and the patient appears to strongly dislike being in healthcare environment, would offer option of home hospice. - Continue aricept, add zyprexa qHS and we could add IV haldol if he becomes combative again.  Not taking po medications or food/drink: This is not due to dysphagia. SLP evaluation without evidence of dysphagia.   T2DM: HbA1c down to 8.7% from 10.9%.  - Stopped basal insulin, continue dextrose in IVF since no po intake.   DVT prophylaxis: Lovenox 30mg  q24h Code Status:  Full Family Communication: None at bedside. Spoke to wife at long length this morning. She describes [name] as "ornery" but he will respond to authoritative directions which is consistent with my experience as well. Disposition Plan:  Status is: Inpatient  Remains inpatient appropriate because:Ongoing diagnostic testing needed not appropriate for outpatient work up, Unsafe d/c plan and IV treatments appropriate due to intensity of illness or inability to take PO  Dispo: The patient is from: Home              Anticipated d/c is to: PT/OT consults pending - anticipate SNF rehab              Patient currently is not medically stable to d/c.   Difficult to place patient No  Consultants:   Surgery  ID  Procedures:   None  Antimicrobials:  Ancef 3/16 - 3/17  Daptomycin 3/17 >>   Subjective: Still having intermittent nonadherence to care delivery and moaning without ability to state cause. Eating and drinking nothing. No fevers.   Objective: Vitals:   08/04/20 0914 08/04/20 1635 08/04/20 2009 08/05/20 0652  BP: 94/65 111/61 95/69 126/67  Pulse: 82 84 91 92  Resp: 18 20 (!) 21 18  Temp: 98.4 F (36.9 C) 98.5 F (36.9 C) 97.8 F (36.6 C) 98.4 F (36.9 C)  TempSrc: Oral Oral Axillary   SpO2: 97% 96% 94% 97%  Weight:      Height:        Intake/Output Summary (Last 24 hours) at 08/05/2020 1106 Last data filed at 08/05/2020 0900 Gross per 24 hour  Intake 1287.62 ml  Output 975 ml  Net 312.62 ml   Filed Weights   07/26/2020 2122  Weight: 71.2 kg   Gen: Frail, frazzled male in no distress Pulm: Nonlabored breathing room air. Clear anteriorly. CV: Regular rate and rhythm. No murmur, rub, or gallop. No JVD, trace dependent edema. GI: Abdomen soft, non-tender, non-distended, with normoactive bowel sounds.  Ext: Warm, no deformities Skin: No new rashes, lesions or ulcers on visualized skin. Pt declined exam of left buttock wound as he was on that side and didn't want to roll  over. Neuro: Alert, redirectable and oriented, remembers me when redirected but otherwise moaning, moving all extremities. No focal neurological deficits. Psych: Judgement and insight appear impaired, waxing/waning. Needed mittens last night, off now.   Data Reviewed: I have personally reviewed following labs and imaging studies  CBC: Recent Labs  Lab 07/31/20 0550 08/01/20 0251 08/02/20 0557 08/04/20 0304 08/05/20 0742  WBC 15.7* 15.1* 16.5* 18.4* 15.1*  NEUTROABS 13.0*  --  13.9*  --   --   HGB 12.5* 11.4* 12.1* 12.3* 12.5*  HCT 40.7 37.5* 40.1 40.0 40.3  MCV 89.1 90.8 91.6 90.9 90.2  PLT 190 192 186 210 243   Basic Metabolic Panel: Recent Labs  Lab 07/31/20 0550 08/01/20 0251 08/02/20 0557 08/04/20 0304 08/05/20 0742  NA 134* 135 135 136 138  K 3.6 3.3* 3.9 4.6 4.6  CL 97* 96* 98 96* 98  CO2 27 32 30 32 32  GLUCOSE 154* 81 86 77 232*  BUN 21 19 25* 33* 39*  CREATININE 1.64* 1.55* 1.84* 2.07* 1.87*  CALCIUM  8.2* 8.3* 8.0* 8.5* 8.7*   GFR: Estimated Creatinine Clearance: 27.4 mL/min (A) (by C-G formula based on SCr of 1.87 mg/dL (H)). Liver Function Tests: Recent Labs  Lab 08/07/2020 2151 07/31/20 0550  AST 20 19  ALT 11 11  ALKPHOS 73 60  BILITOT 1.0 1.0  PROT 7.7 6.8  ALBUMIN 2.7* 2.3*   Cardiac Enzymes: Recent Labs  Lab 08/01/20 1026 08/04/20 0304  CKTOTAL 340 151   Urine analysis:    Component Value Date/Time   COLORURINE YELLOW 07/31/2020 2033   APPEARANCEUR CLEAR 07/31/2020 2033   LABSPEC 1.015 07/31/2020 2033   PHURINE 5.0 07/31/2020 2033   GLUCOSEU NEGATIVE 07/31/2020 2033   HGBUR MODERATE (A) 07/31/2020 2033   BILIRUBINUR NEGATIVE 07/31/2020 2033   KETONESUR NEGATIVE 07/31/2020 2033   PROTEINUR 100 (A) 07/31/2020 2033   UROBILINOGEN 0.2 09/19/2011 1701   NITRITE NEGATIVE 07/31/2020 2033   LEUKOCYTESUR NEGATIVE 07/31/2020 2033   Recent Results (from the past 240 hour(s))  SARS CORONAVIRUS 2 (TAT 6-24 HRS) Nasopharyngeal Nasopharyngeal  Swab     Status: None   Collection Time: 07/31/20  3:01 AM   Specimen: Nasopharyngeal Swab  Result Value Ref Range Status   SARS Coronavirus 2 NEGATIVE NEGATIVE Final    Comment: (NOTE) SARS-CoV-2 target nucleic acids are NOT DETECTED.  The SARS-CoV-2 RNA is generally detectable in upper and lower respiratory specimens during the acute phase of infection. Negative results do not preclude SARS-CoV-2 infection, do not rule out co-infections with other pathogens, and should not be used as the sole basis for treatment or other patient management decisions. Negative results must be combined with clinical observations, patient history, and epidemiological information. The expected result is Negative.  Fact Sheet for Patients: HairSlick.nohttps://www.fda.gov/media/138098/download  Fact Sheet for Healthcare Providers: quierodirigir.comhttps://www.fda.gov/media/138095/download  This test is not yet approved or cleared by the [place] FDA and  has been authorized for detection and/or diagnosis of SARS-CoV-2 by FDA under an Emergency Use Authorization (EUA). This EUA will remain  in effect (meaning this test can be used) for the duration of the COVID-19 declaration under Se ction 564(b)(1) of the Act, 21 U.S.C. section 360bbb-3(b)(1), unless the authorization is terminated or revoked sooner.  Performed at [facility], [address] [place], [place] [address]   Culture, blood (routine x 2)     Status: Abnormal   Collection Time: 07/31/20  8:01 AM   Specimen: BLOOD LEFT HAND  Result Value Ref Range Status   Specimen Description BLOOD LEFT HAND  Final   Special Requests   Final    BOTTLES DRAWN AEROBIC ONLY Blood Culture adequate volume   Culture  Setup Time   Final    AEROBIC BOTTLE ONLY GRAM POSITIVE COCCI CRITICAL RESULT CALLED TO, READ BACK BY AND VERIFIED WITH: Marissa NestleHARMD THOMAS J [id number] FCP Performed at [facility], [address] [place], [place] [address]    Culture METHICILLIN  RESISTANT STAPHYLOCOCCUS AUREUS (A)  Final   Report Status 08/03/2020 FINAL  Final   Organism ID, Bacteria METHICILLIN RESISTANT STAPHYLOCOCCUS AUREUS  Final      Susceptibility   Methicillin resistant staphylococcus aureus - MIC*    CIPROFLOXACIN >=8 RESISTANT Resistant     ERYTHROMYCIN >=8 RESISTANT Resistant     GENTAMICIN <=0.5 SENSITIVE Sensitive     OXACILLIN >=4 RESISTANT Resistant     TETRACYCLINE <=1 SENSITIVE Sensitive     VANCOMYCIN 1 SENSITIVE Sensitive     TRIMETH/SULFA >=320 RESISTANT Resistant  CLINDAMYCIN <=0.25 SENSITIVE Sensitive     RIFAMPIN <=0.5 SENSITIVE Sensitive     Inducible Clindamycin NEGATIVE Sensitive     * METHICILLIN RESISTANT STAPHYLOCOCCUS AUREUS  Blood Culture ID Panel (Reflexed)     Status: Abnormal   Collection Time: 07/31/20  8:01 AM  Result Value Ref Range Status   Enterococcus faecalis NOT DETECTED NOT DETECTED Final   Enterococcus Faecium NOT DETECTED NOT DETECTED Final   Listeria monocytogenes NOT DETECTED NOT DETECTED Final   Staphylococcus species DETECTED (A) NOT DETECTED Final    Comment: CRITICAL RESULT CALLED TO, READ BACK BY AND VERIFIED WITH: Marissa Nestle [id number] FCP    Staphylococcus aureus (BCID) DETECTED (A) NOT DETECTED Final    Comment: Methicillin (oxacillin)-resistant Staphylococcus aureus (MRSA). MRSA is predictably resistant to beta-lactam antibiotics (except ceftaroline). Preferred therapy is vancomycin unless clinically contraindicated. Patient requires contact precautions if  hospitalized. CRITICAL RESULT CALLED TO, READ BACK BY AND VERIFIED WITH: PHARMD THOMAS J [id number] FCP    Staphylococcus epidermidis NOT DETECTED NOT DETECTED Final   Staphylococcus lugdunensis NOT DETECTED NOT DETECTED Final   Streptococcus species NOT DETECTED NOT DETECTED Final   Streptococcus agalactiae NOT DETECTED NOT DETECTED Final   Streptococcus pneumoniae NOT DETECTED NOT DETECTED Final   Streptococcus pyogenes NOT DETECTED  NOT DETECTED Final   A.calcoaceticus-baumannii NOT DETECTED NOT DETECTED Final   Bacteroides fragilis NOT DETECTED NOT DETECTED Final   Enterobacterales NOT DETECTED NOT DETECTED Final   Enterobacter cloacae complex NOT DETECTED NOT DETECTED Final   Escherichia coli NOT DETECTED NOT DETECTED Final   Klebsiella aerogenes NOT DETECTED NOT DETECTED Final   Klebsiella oxytoca NOT DETECTED NOT DETECTED Final   Klebsiella pneumoniae NOT DETECTED NOT DETECTED Final   Proteus species NOT DETECTED NOT DETECTED Final   Salmonella species NOT DETECTED NOT DETECTED Final   Serratia marcescens NOT DETECTED NOT DETECTED Final   Haemophilus influenzae NOT DETECTED NOT DETECTED Final   Neisseria meningitidis NOT DETECTED NOT DETECTED Final   Pseudomonas aeruginosa NOT DETECTED NOT DETECTED Final   Stenotrophomonas maltophilia NOT DETECTED NOT DETECTED Final   Candida albicans NOT DETECTED NOT DETECTED Final   Candida auris NOT DETECTED NOT DETECTED Final   Candida glabrata NOT DETECTED NOT DETECTED Final   Candida krusei NOT DETECTED NOT DETECTED Final   Candida parapsilosis NOT DETECTED NOT DETECTED Final   Candida tropicalis NOT DETECTED NOT DETECTED Final   Cryptococcus neoformans/gattii NOT DETECTED NOT DETECTED Final   Meth resistant mecA/C and MREJ DETECTED (A) NOT DETECTED Final    Comment: CRITICAL RESULT CALLED TO, READ BACK BY AND VERIFIED WITH: Marissa Nestle [phone] FCP Performed at [facility], [address] [place], [place] [address]   Culture, blood (routine x 2)     Status: None   Collection Time: 07/31/20  8:02 AM   Specimen: BLOOD  Result Value Ref Range Status   Specimen Description BLOOD LEFT ANTECUBITAL  Final   Special Requests   Final    BOTTLES DRAWN AEROBIC ONLY Blood Culture adequate volume   Culture   Final    NO GROWTH 5 DAYS Performed at [facility], [address], [place], [place] [address]    Report Status 08/05/2020 FINAL  Final  Culture,  blood (routine x 2)     Status: None (Preliminary result)   Collection Time: 08/02/20  5:57 AM   Specimen: BLOOD LEFT ARM  Result Value Ref Range Status   Specimen Description  BLOOD LEFT ARM  Final   Special Requests   Final    BOTTLES DRAWN AEROBIC AND ANAEROBIC Blood Culture results may not be optimal due to an excessive volume of blood received in culture bottles   Culture   Final    NO GROWTH 3 DAYS Performed at [facility], [address], [place], [place] [address]    Report Status PENDING  Incomplete  Culture, blood (routine x 2)     Status: None (Preliminary result)   Collection Time: 08/02/20  5:57 AM   Specimen: BLOOD LEFT HAND  Result Value Ref Range Status   Specimen Description BLOOD LEFT HAND  Final   Special Requests   Final    BOTTLES DRAWN AEROBIC AND ANAEROBIC Blood Culture adequate volume   Culture   Final    NO GROWTH 3 DAYS Performed at [facility], [address] [place], [place] [address]    Report Status PENDING  Incomplete      Radiology Studies: No results found.  Scheduled Meds: . atorvastatin  20 mg Oral Daily  . carvedilol  12.5 mg Oral BID WC  . donepezil  10 mg Oral QHS  . enoxaparin (LOVENOX) injection  30 mg Subcutaneous Daily  . insulin aspart  0-9 Units Subcutaneous Q4H  . pantoprazole  40 mg Oral Daily  . polyethylene glycol  17 g Oral Daily  . vitamin B-12  1,000 mcg Oral Daily   Continuous Infusions: . DAPTOmycin (CUBICIN)  IV 650 mg (08/03/20 2149)  . dextrose 5% lactated ringers 100 mL/hr at 08/05/20 0316     LOS: 4 days   Time spent: 35 minutes.  Tyrone Nine, MD [facility] [email or web address] 08/05/2020, 11:06 AM

## 2020-08-05 NOTE — Progress Notes (Signed)
During the night, the patient remains agitated, argumentative, pulling oxygen off, and refusing anything to eat or drink.  It takes maximum 3 people to turn and position patient due to pushing against staff and resisting to be turned even after stating he needs to be turned.  He is complaining of severe pain all over; but is refusing to take anything by mouth.  He is screaming and saying Help.  When assessed, he is unwilling to verbalize what help he needs.  He began striking at staff when attempting to put his oxygen back on.  His Oxygen levels will drop into the low 80s without it.   Green Safety Mitts placed on patient and oxygen replaced.  Repositioned several times with the help of multiple staff.  Will continue to monitor patient.  Bernie Covey RN

## 2020-08-05 NOTE — Progress Notes (Signed)
Brief ID note:  Patient chart reviewed this morning with noted episodes of agitation and combativeness.  Continues on daptomycin for MRSA bacteremia with repeat blood cultures 08/02/2020 no growth to date.  Presumed source of infection from a left gluteal wound with no evidence of abscess seen on imaging and no debridement recommended by surgery.  Physical therapy has recommended home health PT versus SNF.  Will change antibiotics to vancomycin per pharmacy recommendations given cost associated with daptomycin with his insurance or if he is going to SNF as well.  TEE scheduled for tomorrow.   Vedia Coffer for Infectious Disease [facility] 08/05/2020, 12:17 PM

## 2020-08-05 NOTE — Progress Notes (Signed)
  Pharmacy Antibiotic Note  Ivan Parks is a 80 y.o. male admitted on 07/29/2020 with MRSA bacteremia. Pharmacy has been consulted for vancomycin dosing.  Scr 1.87, very sporadic, difficult to assess baseline but seems to be slightly elevated. Predicted AUC with 750mg  q24h was 538 with peak 30.6 and trough 16.4.   Plan: Stop daptomycin  Start vancomycin 750mg  IV q24h  Monitor Scr daily, and levels as needed   Height: 5\' 5"  (165.1 cm) Weight: 71.2 kg (157 lb) IBW/kg (Calculated) : 61.5  Temp (24hrs), Avg:98.2 F (36.8 C), Min:97.8 F (36.6 C), Max:98.5 F (36.9 C)  Recent Labs  Lab 07/31/20 0550 08/01/20 0251 08/02/20 0557 08/04/20 0304 08/05/20 0742  WBC 15.7* 15.1* 16.5* 18.4* 15.1*  CREATININE 1.64* 1.55* 1.84* 2.07* 1.87*    Estimated Creatinine Clearance: 27.4 mL/min (A) (by C-G formula based on SCr of 1.87 mg/dL (H)).    No Known Allergies  Antimicrobials this admission: Daptomycin 3/17>>3/21 Vancomycin 3/21>>   Microbiology results: 3/16 BC: MRSA 3/18 BC: ngtd  Thank you for allowing pharmacy to be a part of this patient's care.  4/21 08/05/2020 12:47 PM

## 2020-08-06 ENCOUNTER — Inpatient Hospital Stay (HOSPITAL_COMMUNITY): Payer: HMO | Admitting: Anesthesiology

## 2020-08-06 ENCOUNTER — Encounter (HOSPITAL_COMMUNITY): Admission: EM | Disposition: E | Payer: Self-pay | Source: Home / Self Care | Attending: Family Medicine

## 2020-08-06 ENCOUNTER — Inpatient Hospital Stay (HOSPITAL_COMMUNITY): Payer: HMO

## 2020-08-06 ENCOUNTER — Encounter (HOSPITAL_COMMUNITY): Payer: Self-pay | Admitting: Family Medicine

## 2020-08-06 DIAGNOSIS — I5032 Chronic diastolic (congestive) heart failure: Secondary | ICD-10-CM | POA: Diagnosis not present

## 2020-08-06 DIAGNOSIS — I351 Nonrheumatic aortic (valve) insufficiency: Secondary | ICD-10-CM | POA: Diagnosis not present

## 2020-08-06 DIAGNOSIS — I361 Nonrheumatic tricuspid (valve) insufficiency: Secondary | ICD-10-CM | POA: Diagnosis not present

## 2020-08-06 DIAGNOSIS — N179 Acute kidney failure, unspecified: Secondary | ICD-10-CM | POA: Diagnosis not present

## 2020-08-06 DIAGNOSIS — R7881 Bacteremia: Secondary | ICD-10-CM | POA: Diagnosis not present

## 2020-08-06 DIAGNOSIS — R109 Unspecified abdominal pain: Secondary | ICD-10-CM | POA: Diagnosis not present

## 2020-08-06 DIAGNOSIS — E1165 Type 2 diabetes mellitus with hyperglycemia: Secondary | ICD-10-CM | POA: Diagnosis not present

## 2020-08-06 HISTORY — PX: TEE WITHOUT CARDIOVERSION: SHX5443

## 2020-08-06 LAB — BASIC METABOLIC PANEL
Anion gap: 9 (ref 5–15)
BUN: 36 mg/dL — ABNORMAL HIGH (ref 8–23)
CO2: 29 mmol/L (ref 22–32)
Calcium: 8.4 mg/dL — ABNORMAL LOW (ref 8.9–10.3)
Chloride: 99 mmol/L (ref 98–111)
Creatinine, Ser: 1.62 mg/dL — ABNORMAL HIGH (ref 0.61–1.24)
GFR, Estimated: 43 mL/min — ABNORMAL LOW (ref >60)
Glucose, Bld: 237 mg/dL — ABNORMAL HIGH (ref 70–99)
Potassium: 4.8 mmol/L (ref 3.5–5.1)
Sodium: 137 mmol/L (ref 135–145)

## 2020-08-06 LAB — GLUCOSE, CAPILLARY
Glucose-Capillary: 229 mg/dL — ABNORMAL HIGH (ref 70–99)
Glucose-Capillary: 209 mg/dL — ABNORMAL HIGH (ref 70–99)
Glucose-Capillary: 233 mg/dL — ABNORMAL HIGH (ref 70–99)
Glucose-Capillary: 122 mg/dL — ABNORMAL HIGH (ref 70–99)
Glucose-Capillary: 136 mg/dL — ABNORMAL HIGH (ref 70–99)
Glucose-Capillary: 163 mg/dL — ABNORMAL HIGH (ref 70–99)

## 2020-08-06 LAB — CBC
HCT: 39.1 % (ref 39.0–52.0)
Hemoglobin: 11.4 g/dL — ABNORMAL LOW (ref 13.0–17.0)
MCH: 27.2 pg (ref 26.0–34.0)
MCHC: 29.2 g/dL — ABNORMAL LOW (ref 30.0–36.0)
MCV: 93.3 fL (ref 80.0–100.0)
Platelets: 169 10*3/uL (ref 150–400)
RBC: 4.19 MIL/uL — ABNORMAL LOW (ref 4.22–5.81)
RDW: 12.5 % (ref 11.5–15.5)
WBC: 15.8 10*3/uL — ABNORMAL HIGH (ref 4.0–10.5)
nRBC: 0 % (ref 0.0–0.2)

## 2020-08-06 SURGERY — ECHOCARDIOGRAM, TRANSESOPHAGEAL
Anesthesia: Monitor Anesthesia Care

## 2020-08-06 MED ORDER — PROPOFOL 10 MG/ML IV BOLUS
INTRAVENOUS | Status: DC | PRN
  Administered 2020-08-06: 30 mg via INTRAVENOUS

## 2020-08-06 MED ORDER — SODIUM CHLORIDE 0.9 % IV SOLN: INTRAVENOUS | Status: DC

## 2020-08-06 MED ORDER — BUTAMBEN-TETRACAINE-BENZOCAINE 2-2-14 % EX AERO
INHALATION_SPRAY | CUTANEOUS | Status: DC | PRN
  Administered 2020-08-06: 2 via TOPICAL

## 2020-08-06 MED ORDER — PHENYLEPHRINE 40 MCG/ML (10ML) SYRINGE FOR IV PUSH (FOR BLOOD PRESSURE SUPPORT)
PREFILLED_SYRINGE | INTRAVENOUS | Status: DC | PRN
  Administered 2020-08-06 (×5): 80 ug via INTRAVENOUS

## 2020-08-06 MED ORDER — LIDOCAINE 2% (20 MG/ML) 5 ML SYRINGE
INTRAMUSCULAR | Status: DC | PRN
  Administered 2020-08-06: 100 mg via INTRAVENOUS

## 2020-08-06 MED ORDER — PROPOFOL 500 MG/50ML IV EMUL
INTRAVENOUS | Status: DC | PRN
  Administered 2020-08-06: 100 ug/kg/min via INTRAVENOUS

## 2020-08-06 MED ORDER — INSULIN DETEMIR 100 UNIT/ML ~~LOC~~ SOLN
5.0000 [IU] | Freq: Every day | SUBCUTANEOUS | Status: DC
  Administered 2020-08-06 – 2020-08-08 (×2): 5 [IU] via SUBCUTANEOUS
  Filled 2020-08-06 (×3): qty 0.05

## 2020-08-06 MED ORDER — DEXTROSE-NACL 5-0.45 % IV SOLN: INTRAVENOUS | Status: DC

## 2020-08-06 NOTE — Anesthesia Procedure Notes (Signed)
Procedure Name: MAC Date/Time: 08/10/2020 11:10 AM Performed by: Amadeo Garnet, CRNA Pre-anesthesia Checklist: Patient identified, Emergency Drugs available, Suction available and Patient being monitored Patient Re-evaluated:Patient Re-evaluated prior to induction Oxygen Delivery Method: Nasal cannula Preoxygenation: Pre-oxygenation with 100% oxygen Induction Type: IV induction Placement Confirmation: positive ETCO2 Dental Injury: Teeth and Oropharynx as per pre-operative assessment

## 2020-08-06 NOTE — Progress Notes (Signed)
PROGRESS NOTE  [name]  XBJ:[id number] DOB: Oct 23, 1940 DOA: 07/22/2020 PCP: Laurann Montana, MD   Brief Narrative: Ivan Parks is an 80 y.o. male with a history of T2DM, chronic HFpEF, venous insufficiency, stage IIIb CKD, 2L O2-dependent COPD, cognitive deficits, and right inguinal hernia who presented to the ED for the 2nd time in the past month with right groin pain, poor appetite. He demonstrated a leukocytosis to 15.7k and AKI with creatinine of 1.9 from baseline of 1.3. Hydrocele seen on scrotum U/S without torsion. CT renal stone study without acute abnormality to explain pain. Exam benign. Patient was admitted with pelvic U/S pending this AM. Left buttock wound was noted with induration and no abscess on exam or imaging for which surgery was consulted (no I&D recommended) and ancef was started. Blood culture has resulted with MRSA for which ID was consulted and antibiotic changed to daptomycin. TTE without vegetation. TEE 2020-08-18 also without vegetation, LVEF 55%.   Assessment & Plan: Principal Problem:   MRSA bacteremia Active Problems:   COPD, severe (HCC)   Leg edema, right- Greater than Left leg   Essential hypertension   Acute kidney injury (nontraumatic) (HCC)   CHF (congestive heart failure) (HCC)   Abdominal pain   Controlled type 2 diabetes mellitus with hyperglycemia (HCC)   Pressure injury of skin  Left gluteal cellulitis, MRSA bacteremia: No fluctuance on palpation. No debridement recommended by surgery.  - Changed daptomycin > vancomycin per ID. TTE and TEE negative for vegetation, repeat blood cultures obtained 3/18 are NGx4D.   - Continue local wound care per surgery/WOC.  - Monitor blood culture data.  - Repeat cultures if recurrently febrile.  AKI on stage IIIb CKD: Improved and now reworsening likely due to prerenal azotemia from decreased po intake, may be complicated by relative hypotension. Baseline SCr 1.3-1.5. 1.94 on admission.  - Continue IVF for  now as po intake remains nil. Legs appearing slightly less shriveled, SCr nearing baseline, will change to D51/2NS @ 75cc/hr. - Hold ACE and diuretic still, monitor pulmonary exam. - Monitor BMP - CK wnl  Chronic HFpEF: LVEF preserved, though G2DD and dilated IVC on TTE 3/17. Given a dose of lasix due to leg swelling, though Cr bumped so will not continue this at this time with limited po intake. - Holding diuretic, decrease IVF rate. Pt's family reports steady decline of late. Hold lasix again today, ACEi with AKI as above.   Hydrocele, right groin pain, right inguinal hernia: Hernia is reducible, though may be contributing to some mild discomfort.  - Supportive care - Pt apparently has surgical follow up planned for hernia management.  Venous insufficiency: No DVT on U/S - Elevate legs.  COPD: Severe baseline but no exacerbation.  - Continue supplemental oxygen   HTN: Continue coreg.   Cognitive impairment, debility, now with acute delirium: - Continue delirium precautions, treating pain, prn haldol if agitated/combative.  - PT/OT evaluations. Has assistance with wife, son, step son and daughter but has grown less independent of late.  - Will consult palliative care for assistance with goals of care discussions going forward. Since SNF is recommended and the patient appears to strongly dislike being in healthcare environment, would offer option of home hospice. - Continue aricept, added zyprexa qHS   Not taking po medications or food/drink: This is not due to dysphagia. SLP evaluation without evidence of dysphagia.   T2DM: HbA1c down to 8.7% from 10.9%.  - Will restart low dose basal insulin since CBGs  up with D5 in IVF. Continue sensitive SSI  DVT prophylaxis: Lovenox 30mg  q24h Code Status: Full Family Communication: None at bedside. Spoke with wife last 3/21. Disposition Plan:  Status is: Inpatient  Remains inpatient appropriate because:Ongoing diagnostic testing needed  not appropriate for outpatient work up, Unsafe d/c plan and IV treatments appropriate due to intensity of illness or inability to take PO  Dispo: The patient is from: Home              Anticipated d/c is to: PT/OT consults pending - anticipate SNF rehab              Patient currently is not medically stable to d/c.   Difficult to place patient No  Consultants:   Surgery  ID  Procedures:   None  Antimicrobials:  Ancef 3/16 - 3/17  Daptomycin 3/17 - 3/21  Vancomycin 3/21 >>   Subjective: Pt more cooperative this AM but remains withdrawn. Not eating anything. Does not interact much with me, reports some pain but not severe, worse with palpation of left gluteal area and with repositioning, constant, nonradiating. Amenable to TEE today.  Objective: Vitals:   August 27, 2020 1038 Aug 27, 2020 1136 08/27/20 1146 August 27, 2020 1153  BP: (!) 138/45 (!) 110/47 (!) 113/53 (!) 106/49  Pulse: 60 79 81 76  Resp: (!) 23 (!) 25 (!) 25 (!) 26  Temp: 98.1 F (36.7 C) 98.3 F (36.8 C)    TempSrc: Temporal Oral    SpO2: 98% 99% 100% 100%  Weight: 71.2 kg     Height: 5\' 5"  (1.651 m)       Intake/Output Summary (Last 24 hours) at Aug 27, 2020 1208 Last data filed at 08-27-2020 1126 Gross per 24 hour  Intake 1300.1 ml  Output 100 ml  Net 1200.1 ml   Filed Weights   08/07/2020 2122 2020/08/27 1038  Weight: 71.2 kg 71.2 kg   Gen: Frazzled elderly withdrawn male in no distress Pulm: Nonlabored without crackles. CV: Regular rate and rhythm. No murmur, rub, or gallop. No JVD, trace pitting dependent edema. GI: Abdomen soft, non-tender, non-distended, with normoactive bowel sounds.  Ext: Warm, no deformities. Chronic LE hyperpigmentation Skin: No new rashes, lesions or ulcers on visualized skin. Left gluteal area of induration with central eschar is approximately stable, tracking a bit dependently/medially and inferiorly. Still no fluctuance.  Neuro: Alert, not cooperative with exam but very weak  diffusely. Psych: Difficult to determine. He's calm.   Data Reviewed: I have personally reviewed following labs and imaging studies  CBC: Recent Labs  Lab 07/31/20 0550 08/01/20 0251 08/02/20 0557 08/04/20 0304 08/05/20 0742 Aug 27, 2020 0323  WBC 15.7* 15.1* 16.5* 18.4* 15.1* 15.8*  NEUTROABS 13.0*  --  13.9*  --   --   --   HGB 12.5* 11.4* 12.1* 12.3* 12.5* 11.4*  HCT 40.7 37.5* 40.1 40.0 40.3 39.1  MCV 89.1 90.8 91.6 90.9 90.2 93.3  PLT 190 192 186 210 243 169   Basic Metabolic Panel: Recent Labs  Lab 08/01/20 0251 08/02/20 0557 08/04/20 0304 08/05/20 0742 08-27-20 0323  NA 135 135 136 138 137  K 3.3* 3.9 4.6 4.6 4.8  CL 96* 98 96* 98 99  CO2 32 30 32 32 29  GLUCOSE 81 86 77 232* 237*  BUN 19 25* 33* 39* 36*  CREATININE 1.55* 1.84* 2.07* 1.87* 1.62*  CALCIUM 8.3* 8.0* 8.5* 8.7* 8.4*   GFR: Estimated Creatinine Clearance: 31.6 mL/min (A) (by C-G formula based on SCr of 1.62 mg/dL (H)). Liver Function  Tests: Recent Labs  Lab 07/19/2020 2151 07/31/20 0550  AST 20 19  ALT 11 11  ALKPHOS 73 60  BILITOT 1.0 1.0  PROT 7.7 6.8  ALBUMIN 2.7* 2.3*   Cardiac Enzymes: Recent Labs  Lab 08/01/20 1026 08/04/20 0304  CKTOTAL 340 151   Urine analysis:    Component Value Date/Time   COLORURINE YELLOW 07/31/2020 2033   APPEARANCEUR CLEAR 07/31/2020 2033   LABSPEC 1.015 07/31/2020 2033   PHURINE 5.0 07/31/2020 2033   GLUCOSEU NEGATIVE 07/31/2020 2033   HGBUR MODERATE (A) 07/31/2020 2033   BILIRUBINUR NEGATIVE 07/31/2020 2033   KETONESUR NEGATIVE 07/31/2020 2033   PROTEINUR 100 (A) 07/31/2020 2033   UROBILINOGEN 0.2 09/19/2011 1701   NITRITE NEGATIVE 07/31/2020 2033   LEUKOCYTESUR NEGATIVE 07/31/2020 2033   Recent Results (from the past 240 hour(s))  SARS CORONAVIRUS 2 (TAT 6-24 HRS) Nasopharyngeal Nasopharyngeal Swab     Status: None   Collection Time: 07/31/20  3:01 AM   Specimen: Nasopharyngeal Swab  Result Value Ref Range Status   SARS Coronavirus 2  NEGATIVE NEGATIVE Final    Comment: (NOTE) SARS-CoV-2 target nucleic acids are NOT DETECTED.  The SARS-CoV-2 RNA is generally detectable in upper and lower respiratory specimens during the acute phase of infection. Negative results do not preclude SARS-CoV-2 infection, do not rule out co-infections with other pathogens, and should not be used as the sole basis for treatment or other patient management decisions. Negative results must be combined with clinical observations, patient history, and epidemiological information. The expected result is Negative.  Fact Sheet for Patients: HairSlick.no  Fact Sheet for Healthcare Providers: quierodirigir.com  This test is not yet approved or cleared by the [place] FDA and  has been authorized for detection and/or diagnosis of SARS-CoV-2 by FDA under an Emergency Use Authorization (EUA). This EUA will remain  in effect (meaning this test can be used) for the duration of the COVID-19 declaration under Se ction 564(b)(1) of the Act, 21 U.S.C. section 360bbb-3(b)(1), unless the authorization is terminated or revoked sooner.  Performed at [facility], [address], [place], [place] [address]   Culture, blood (routine x 2)     Status: Abnormal   Collection Time: 07/31/20  8:01 AM   Specimen: BLOOD LEFT HAND  Result Value Ref Range Status   Specimen Description BLOOD LEFT HAND  Final   Special Requests   Final    BOTTLES DRAWN AEROBIC ONLY Blood Culture adequate volume   Culture  Setup Time   Final    AEROBIC BOTTLE ONLY GRAM POSITIVE COCCI CRITICAL RESULT CALLED TO, READ BACK BY AND VERIFIED WITH: Marissa Nestle [id number] FCP Performed at [facility], [address] [place], [place] [address]    Culture METHICILLIN RESISTANT STAPHYLOCOCCUS AUREUS (A)  Final   Report Status 08/03/2020 FINAL  Final   Organism ID, Bacteria METHICILLIN RESISTANT STAPHYLOCOCCUS  AUREUS  Final      Susceptibility   Methicillin resistant staphylococcus aureus - MIC*    CIPROFLOXACIN >=8 RESISTANT Resistant     ERYTHROMYCIN >=8 RESISTANT Resistant     GENTAMICIN <=0.5 SENSITIVE Sensitive     OXACILLIN >=4 RESISTANT Resistant     TETRACYCLINE <=1 SENSITIVE Sensitive     VANCOMYCIN 1 SENSITIVE Sensitive     TRIMETH/SULFA >=320 RESISTANT Resistant     CLINDAMYCIN <=0.25 SENSITIVE Sensitive     RIFAMPIN <=0.5 SENSITIVE Sensitive     Inducible Clindamycin NEGATIVE Sensitive     *  METHICILLIN RESISTANT STAPHYLOCOCCUS AUREUS  Blood Culture ID Panel (Reflexed)     Status: Abnormal   Collection Time: 07/31/20  8:01 AM  Result Value Ref Range Status   Enterococcus faecalis NOT DETECTED NOT DETECTED Final   Enterococcus Faecium NOT DETECTED NOT DETECTED Final   Listeria monocytogenes NOT DETECTED NOT DETECTED Final   Staphylococcus species DETECTED (A) NOT DETECTED Final    Comment: CRITICAL RESULT CALLED TO, READ BACK BY AND VERIFIED WITH: Marissa NestleHARMD THOMAS J [id number] FCP    Staphylococcus aureus (BCID) DETECTED (A) NOT DETECTED Final    Comment: Methicillin (oxacillin)-resistant Staphylococcus aureus (MRSA). MRSA is predictably resistant to beta-lactam antibiotics (except ceftaroline). Preferred therapy is vancomycin unless clinically contraindicated. Patient requires contact precautions if  hospitalized. CRITICAL RESULT CALLED TO, READ BACK BY AND VERIFIED WITH: PHARMD THOMAS J [id number] FCP    Staphylococcus epidermidis NOT DETECTED NOT DETECTED Final   Staphylococcus lugdunensis NOT DETECTED NOT DETECTED Final   Streptococcus species NOT DETECTED NOT DETECTED Final   Streptococcus agalactiae NOT DETECTED NOT DETECTED Final   Streptococcus pneumoniae NOT DETECTED NOT DETECTED Final   Streptococcus pyogenes NOT DETECTED NOT DETECTED Final   A.calcoaceticus-baumannii NOT DETECTED NOT DETECTED Final   Bacteroides fragilis NOT DETECTED NOT DETECTED Final    Enterobacterales NOT DETECTED NOT DETECTED Final   Enterobacter cloacae complex NOT DETECTED NOT DETECTED Final   Escherichia coli NOT DETECTED NOT DETECTED Final   Klebsiella aerogenes NOT DETECTED NOT DETECTED Final   Klebsiella oxytoca NOT DETECTED NOT DETECTED Final   Klebsiella pneumoniae NOT DETECTED NOT DETECTED Final   Proteus species NOT DETECTED NOT DETECTED Final   Salmonella species NOT DETECTED NOT DETECTED Final   Serratia marcescens NOT DETECTED NOT DETECTED Final   Haemophilus influenzae NOT DETECTED NOT DETECTED Final   Neisseria meningitidis NOT DETECTED NOT DETECTED Final   Pseudomonas aeruginosa NOT DETECTED NOT DETECTED Final   Stenotrophomonas maltophilia NOT DETECTED NOT DETECTED Final   Candida albicans NOT DETECTED NOT DETECTED Final   Candida auris NOT DETECTED NOT DETECTED Final   Candida glabrata NOT DETECTED NOT DETECTED Final   Candida krusei NOT DETECTED NOT DETECTED Final   Candida parapsilosis NOT DETECTED NOT DETECTED Final   Candida tropicalis NOT DETECTED NOT DETECTED Final   Cryptococcus neoformans/gattii NOT DETECTED NOT DETECTED Final   Meth resistant mecA/C and MREJ DETECTED (A) NOT DETECTED Final    Comment: CRITICAL RESULT CALLED TO, READ BACK BY AND VERIFIED WITH: Marissa NestleHARMD THOMAS J [id number] FCP Performed at [facility], [address] [place], [place] [address]   Culture, blood (routine x 2)     Status: None   Collection Time: 07/31/20  8:02 AM   Specimen: BLOOD  Result Value Ref Range Status   Specimen Description BLOOD LEFT ANTECUBITAL  Final   Special Requests   Final    BOTTLES DRAWN AEROBIC ONLY Blood Culture adequate volume   Culture   Final    NO GROWTH 5 DAYS Performed at [facility], [address] [place], [place] [address]    Report Status 08/05/2020 FINAL  Final  Culture, blood (routine x 2)     Status: None (Preliminary result)   Collection Time: 08/02/20  5:57 AM   Specimen: BLOOD LEFT ARM  Result Value  Ref Range Status   Specimen Description BLOOD LEFT ARM  Final   Special Requests   Final    BOTTLES DRAWN AEROBIC AND ANAEROBIC Blood Culture results may not  be optimal due to an excessive volume of blood received in culture bottles   Culture   Final    NO GROWTH 4 DAYS Performed at [facility], [address] [place], [place] [address]    Report Status PENDING  Incomplete  Culture, blood (routine x 2)     Status: None (Preliminary result)   Collection Time: 08/02/20  5:57 AM   Specimen: BLOOD LEFT HAND  Result Value Ref Range Status   Specimen Description BLOOD LEFT HAND  Final   Special Requests   Final    BOTTLES DRAWN AEROBIC AND ANAEROBIC Blood Culture adequate volume   Culture   Final    NO GROWTH 4 DAYS Performed at [facility], [address], [place], [place] [address]    Report Status PENDING  Incomplete      Radiology Studies: No results found.  Scheduled Meds: . [MAR Hold] atorvastatin  20 mg Oral Daily  . [MAR Hold] carvedilol  12.5 mg Oral BID WC  . [MAR Hold] donepezil  10 mg Oral QHS  . [MAR Hold] enoxaparin (LOVENOX) injection  30 mg Subcutaneous Daily  . [MAR Hold] insulin aspart  0-9 Units Subcutaneous Q4H  . [MAR Hold] OLANZapine  5 mg Oral QHS  . [MAR Hold] pantoprazole  40 mg Oral Daily  . [MAR Hold] polyethylene glycol  17 g Oral Daily  . [MAR Hold] vitamin B-12  1,000 mcg Oral Daily   Continuous Infusions: . sodium chloride    . sodium chloride Stopped (08/11/2020 1142)  . dextrose 5% lactated ringers 75 mL/hr at 07/27/2020 0810  . [MAR Hold] vancomycin 750 mg (08/05/20 1836)     LOS: 5 days   Time spent: 35 minutes.  Tyrone Nine, MD [facility] [email or web address] 07/17/2020, 12:08 PM

## 2020-08-06 NOTE — CV Procedure (Signed)
Brief TEE Note  LVEF 55% Moderate TR Mild AR No LA/LAA thrombus No endocarditis  For additional details see full report.  Ivan Parks [name] Salvia, MD, [facility] 07/28/2020 11:31 AM

## 2020-08-06 NOTE — Anesthesia Postprocedure Evaluation (Signed)
Anesthesia Post Note  Patient: Ivan Parks  Procedure(s) Performed: TRANSESOPHAGEAL ECHOCARDIOGRAM (TEE) (N/A )     Patient location during evaluation: Endoscopy Anesthesia Type: MAC Level of consciousness: awake and alert Pain management: pain level controlled Vital Signs Assessment: post-procedure vital signs reviewed and stable Respiratory status: spontaneous breathing, nonlabored ventilation, respiratory function stable and patient connected to nasal cannula oxygen Cardiovascular status: stable and blood pressure returned to baseline Postop Assessment: no apparent nausea or vomiting Anesthetic complications: no   No complications documented.  Last Vitals:  Vitals:   07/25/2020 1146 07/24/2020 1153  BP: (!) 113/53 (!) 106/49  Pulse: 81 76  Resp: (!) 25 (!) 26  Temp:    SpO2: 100% 100%    Last Pain:  Vitals:   07/27/2020 1153  TempSrc:   PainSc: [place]

## 2020-08-06 NOTE — Transfer of Care (Signed)
Immediate Anesthesia Transfer of Care Note  Patient: Ivan Parks  Procedure(s) Performed: TRANSESOPHAGEAL ECHOCARDIOGRAM (TEE) (N/A )  Patient Location: Endoscopy Unit  Anesthesia Type:MAC  Level of Consciousness: drowsy  Airway & Oxygen Therapy: Patient Spontanous Breathing and Patient connected to nasal cannula oxygen  Post-op Assessment: Report given to RN, Post -op Vital signs reviewed and stable and Patient moving all extremities  Post vital signs: Reviewed and stable  Last Vitals:  Vitals Value Taken Time  BP 110/47 07/22/2020 1136  Temp 36.8 C 07/16/2020 1136  Pulse 78 08/04/2020 1139  Resp 23 07/28/2020 1139  SpO2 99 % 07/28/2020 1139  Vitals shown include unvalidated device data.  Last Pain:  Vitals:   08/14/2020 1136  TempSrc: Oral  PainSc:       Patients Stated Pain Goal: 0 (04/02/51 0802)  Complications: No complications documented.

## 2020-08-06 NOTE — Progress Notes (Signed)
  Echocardiogram Echocardiogram Transesophageal has been performed.  Ivan Parks 07/22/2020, 11:57 AM

## 2020-08-06 NOTE — H&P (Signed)
Ivan Parks is a 80 y.o. male who has presented today for surgery, with the diagnosis of bactermia.  The various methods of treatment have been discussed with the patient and family. After consideration of risks, benefits and other options for treatment, the patient has consented to  Procedure(s): TRANSESOPHAGEAL ECHOCARDIOGRAM (TEE) (N/A) as a surgical intervention .  The patient's history has been reviewed, patient examined, no change in status, stable for surgery.  I have reviewed the patient's chart and labs.  Questions were answered to the patient's satisfaction.    Ulus Hazen [name] Salvia, MD, [facility]  08/09/2020 11:02 AM

## 2020-08-06 NOTE — Plan of Care (Signed)
  Problem: Education: Goal: Knowledge of General Education information will improve Description: Including pain rating scale, medication(s)/side effects and non-pharmacologic comfort measures Outcome: Progressing   Problem: Safety: Goal: Ability to remain free from injury will improve Outcome: Progressing   

## 2020-08-06 NOTE — Plan of Care (Signed)
  Problem: Nutrition: Goal: Adequate nutrition will be maintained Outcome: Not Progressing   Problem: Coping: Goal: Level of anxiety will decrease Outcome: Not Progressing   Problem: Pain Managment: Goal: General experience of comfort will improve Outcome: Progressing   

## 2020-08-07 ENCOUNTER — Inpatient Hospital Stay: Payer: Self-pay

## 2020-08-07 DIAGNOSIS — Z66 Do not resuscitate: Secondary | ICD-10-CM | POA: Diagnosis not present

## 2020-08-07 DIAGNOSIS — E1165 Type 2 diabetes mellitus with hyperglycemia: Secondary | ICD-10-CM | POA: Diagnosis not present

## 2020-08-07 DIAGNOSIS — Z515 Encounter for palliative care: Secondary | ICD-10-CM

## 2020-08-07 DIAGNOSIS — L89329 Pressure ulcer of left buttock, unspecified stage: Secondary | ICD-10-CM | POA: Diagnosis not present

## 2020-08-07 DIAGNOSIS — R627 Adult failure to thrive: Secondary | ICD-10-CM | POA: Diagnosis not present

## 2020-08-07 DIAGNOSIS — R7881 Bacteremia: Secondary | ICD-10-CM | POA: Diagnosis not present

## 2020-08-07 DIAGNOSIS — B9562 Methicillin resistant Staphylococcus aureus infection as the cause of diseases classified elsewhere: Secondary | ICD-10-CM | POA: Diagnosis not present

## 2020-08-07 DIAGNOSIS — F05 Delirium due to known physiological condition: Secondary | ICD-10-CM

## 2020-08-07 LAB — CULTURE, BLOOD (ROUTINE X 2)
Culture: NO GROWTH
Culture: NO GROWTH
Special Requests: ADEQUATE

## 2020-08-07 LAB — BASIC METABOLIC PANEL
Anion gap: 8 (ref 5–15)
BUN: 37 mg/dL — ABNORMAL HIGH (ref 8–23)
CO2: 28 mmol/L (ref 22–32)
Calcium: 8.2 mg/dL — ABNORMAL LOW (ref 8.9–10.3)
Chloride: 103 mmol/L (ref 98–111)
Creatinine, Ser: 1.77 mg/dL — ABNORMAL HIGH (ref 0.61–1.24)
GFR, Estimated: 38 mL/min — ABNORMAL LOW (ref >60)
Glucose, Bld: 214 mg/dL — ABNORMAL HIGH (ref 70–99)
Potassium: 4.9 mmol/L (ref 3.5–5.1)
Sodium: 139 mmol/L (ref 135–145)

## 2020-08-07 LAB — CBC
HCT: 43.3 % (ref 39.0–52.0)
Hemoglobin: 12.4 g/dL — ABNORMAL LOW (ref 13.0–17.0)
MCH: 27.6 pg (ref 26.0–34.0)
MCHC: 28.6 g/dL — ABNORMAL LOW (ref 30.0–36.0)
MCV: 96.2 fL (ref 80.0–100.0)
Platelets: 200 10*3/uL (ref 150–400)
RBC: 4.5 MIL/uL (ref 4.22–5.81)
RDW: 12.6 % (ref 11.5–15.5)
WBC: 12.5 10*3/uL — ABNORMAL HIGH (ref 4.0–10.5)
nRBC: 0 % (ref 0.0–0.2)

## 2020-08-07 LAB — GLUCOSE, CAPILLARY
Glucose-Capillary: 175 mg/dL — ABNORMAL HIGH (ref 70–99)
Glucose-Capillary: 176 mg/dL — ABNORMAL HIGH (ref 70–99)
Glucose-Capillary: 185 mg/dL — ABNORMAL HIGH (ref 70–99)
Glucose-Capillary: 187 mg/dL — ABNORMAL HIGH (ref 70–99)
Glucose-Capillary: 187 mg/dL — ABNORMAL HIGH (ref 70–99)
Glucose-Capillary: 200 mg/dL — ABNORMAL HIGH (ref 70–99)

## 2020-08-07 MED ORDER — ENOXAPARIN SODIUM 40 MG/0.4ML ~~LOC~~ SOLN
40.0000 mg | Freq: Every day | SUBCUTANEOUS | Status: DC
  Administered 2020-08-08 – 2020-08-09 (×2): 40 mg via SUBCUTANEOUS
  Filled 2020-08-07 (×3): qty 0.4

## 2020-08-07 NOTE — Progress Notes (Signed)
Patient would not let me put back the cardiac monitor. Keeps removing it off his chest. Educate patient of the importance, refuse to listened. oncall night provider MD Opyd made aware.

## 2020-08-07 NOTE — Progress Notes (Signed)
Pt pulled his IV out. Went to clean the blood up and change the bed, pt yelling swinging while being cleaned up.

## 2020-08-07 NOTE — Progress Notes (Signed)
[facility] Progress Note  Patient: Ivan Parks    ZOX:[id number]  DOA: 07/24/2020     Date of Service: the patient was seen and examined on 08/07/2020  Brief hospital course: Past medical history of type II DM, chronic HFpEF, CKD 3B, COPD, chronic respiratory failure on 2 LPM, cognitive deficit, noncompliance, right inguinal hernia. Presents with right groin pain found to have left gluteal cellulitis with MRSA bacteremia. ID was consulted. Currently plan is PICC line insertion, antibiotics for 4 weeks and arrangement for safe discharge.  Assessment and Plan: 1. Left gluteal cellulitis, MRSA bacteremia: Sepsis POA Met SIRS criteria on admission with tachycardia tachypnea and leukocytosis along with fever. No fluctuance on palpation.  No debridement recommended by General surgery.  Blood cultures positive for MRSA ID was consulted. Initially patient was on daptomycin. Now changed to vancomycin TTE and TEE negative for any vegetation. Repeat cultures so far negative on 3/18. Continue local wound care. Per ID recommend 4 weeks of antibiotics, and 12/30/2020. ID recommends PICC line over tunneled catheter as patient has history of poor compliance and may likely not come back for a catheter removal which will lead to further complication. Also given his poor understanding of his medical condition patient remains a poor candidate for long-term HD. Based on this information we will insert a PICC line.  2. Cognitive impairment, debility, now with acute delirium: Acute metabolic encephalopathy secondary to - Continue delirium precautions, treating pain, prn haldol if agitated/combative.  - PT/OT evaluations. Has assistance with wife, son, step son and daughter but has grown less independent of late.  - Will consult palliative care for assistance with goals of care discussions going forward. - Continue aricept, added zyprexa qHS  Also perform further work-up for reversible causes for  delirium.  3. AKI on stage IIIb CKD: Improved and now stable for last 48 hours. decreased po intake, Baseline SCr 1.3-1.5. 1.94 on admission.  - Hold ACE and diuretics  4. Chronic HFpEF HTN LVEF preserved, though G2DD and dilated IVC on TTE 3/17.  Given a dose of lasix due to leg swelling, though Cr bumped so will not continue this at this time with limited po intake. family reports steady decline of late.  Blood pressure currently controlled.  Continue Coreg - Holding diuretic, ACEi with AKI as above.   5. Hydrocele, right groin pain, right inguinal hernia:  Hernia is reducible, though may be contributing to some mild discomfort.  - Supportive care - Pt apparently has surgical follow up planned for hernia management.  6.  Type 2 diabetes mellitus uncontrolled with hyperglycemia with renal complication and long-term insulin use On sliding scale insulin right now. Globin A1c trending down. Monitor.  7. Venous insufficiency:  No DVT on U/S - Elevate legs.  8. COPD: Severe  baseline but no exacerbation.  - Continue supplemental oxygen   9.  Poor p.o. intake Likely from AMS. Monitor.  10.  Left buttocks pressure ulcer stage II POA Continue foam dressing  Pressure Injury 07/31/20 Buttocks Left Stage 2 -  Partial thickness loss of dermis presenting as a shallow open injury with a red, pink wound bed without slough. red (Active)  07/31/20 0529  Location: Buttocks  Location Orientation: Left  Staging: Stage 2 -  Partial thickness loss of dermis presenting as a shallow open injury with a red, pink wound bed without slough.  Wound Description (Comments): red  Present on Admission: Yes     Pressure Injury 07/31/20 Buttocks Left Unstageable - Full  thickness tissue loss in which the base of the injury is covered by slough (yellow, tan, gray, green or brown) and/or eschar (tan, brown or black) in the wound bed. surrounding tissue hard red (Active)  07/31/20 0529  Location:  Buttocks  Location Orientation: Left  Staging: Unstageable - Full thickness tissue loss in which the base of the injury is covered by slough (yellow, tan, gray, green or brown) and/or eschar (tan, brown or black) in the wound bed.  Wound Description (Comments): surrounding tissue hard red  Present on Admission: Yes     Diet: Regular diet DVT Prophylaxis:   enoxaparin (LOVENOX) injection 40 mg Start: 08/07/20 1000    Advance goals of care discussion: DNR  Family Communication: no family was present at bedside, at the time of interview.   Disposition:  Status is: Inpatient  Remains inpatient appropriate because: Need for IV antibiotics and arrange for SNF as well as further discussion regarding GOC.  Dispo: The patient is from: Home              Anticipated d/c is to: SNF              Patient currently is not medically stable to d/c.   Difficult to place patient   Subjective: No nausea no vomiting.  No fever no chills.  Patient is not aware why he is here in the hospital and whether he is actually in the hospital or not. Significantly confused.  No hallucination so far.  Physical Exam:  General: Appear in mild distress, no Rash; Oral Mucosa Clear, moist. no Abnormal Neck Mass Or lumps, Conjunctiva normal  Cardiovascular: S1 and S2 Present, no Murmur, Respiratory: good respiratory effort, Bilateral Air entry present and CTA, no Crackles, no wheezes Abdomen: Bowel Sound present, Soft and no tenderness Extremities: no Pedal edema Neurology: alert and not oriented to time, place, and person affect emotionally labile. no new focal deficit Gait not checked due to patient safety concerns  Vitals:   08/07/20 0521 08/07/20 1023 08/07/20 1202 08/07/20 1657  BP: 136/80 (!) 145/62 (!) 125/35 114/77  Pulse: 72 91 78 74  Resp: '17 17  17  ' Temp: 98.3 F (36.8 C) 98.2 F (36.8 C)  97.9 F (36.6 C)  TempSrc: Oral     SpO2: 97% 99%  97%  Weight:      Height:        Intake/Output  Summary (Last 24 hours) at 08/07/2020 1729 Last data filed at 08/07/2020 1300 Gross per 24 hour  Intake 60 ml  Output 250 ml  Net -190 ml   Filed Weights   08/09/2020 2122 08/07/2020 1038 08/11/2020 2050  Weight: 71.2 kg 71.2 kg 71.2 kg    Data Reviewed: I have personally reviewed and interpreted daily labs, tele strips, imaging. I reviewed all nursing notes, pharmacy notes, vitals, pertinent old records I have discussed plan of care as described above with RN and patient/family.  CBC: Recent Labs  Lab 08/02/20 0557 08/04/20 0304 08/05/20 0742 08/05/2020 0323 08/07/20 0454  WBC 16.5* 18.4* 15.1* 15.8* 12.5*  NEUTROABS 13.9*  --   --   --   --   HGB 12.1* 12.3* 12.5* 11.4* 12.4*  HCT 40.1 40.0 40.3 39.1 43.3  MCV 91.6 90.9 90.2 93.3 96.2  PLT 186 210 243 169 115   Basic Metabolic Panel: Recent Labs  Lab 08/02/20 0557 08/04/20 0304 08/05/20 0742 07/17/2020 0323 08/07/20 0454  NA 135 136 138 137 139  K 3.9 4.6 4.6  4.8 4.9  CL 98 96* 98 99 103  CO2 30 32 32 29 28  GLUCOSE 86 77 232* 237* 214*  BUN 25* 33* 39* 36* 37*  CREATININE 1.84* 2.07* 1.87* 1.62* 1.77*  CALCIUM 8.0* 8.5* 8.7* 8.4* 8.2*    Studies: [place] EKG SITE RITE  Result Date: 08/07/2020 If Site Rite image not attached, placement could not be confirmed due to current cardiac rhythm.   Scheduled Meds: . atorvastatin  20 mg Oral Daily  . carvedilol  12.5 mg Oral BID WC  . donepezil  10 mg Oral QHS  . enoxaparin (LOVENOX) injection  40 mg Subcutaneous Daily  . insulin aspart  0-9 Units Subcutaneous Q4H  . insulin detemir  5 Units Subcutaneous Daily  . OLANZapine  5 mg Oral QHS  . pantoprazole  40 mg Oral Daily  . polyethylene glycol  17 g Oral Daily  . vitamin B-12  1,000 mcg Oral Daily   Continuous Infusions: . sodium chloride Stopped (08/05/2020 1142)  . dextrose 5 % and 0.45% NaCl 75 mL/hr at 08/07/20 0431  . vancomycin 750 mg (07/17/2020 1653)   PRN Meds: acetaminophen **OR** acetaminophen, albuterol,  haloperidol **OR** haloperidol lactate, haloperidol lactate, HYDROmorphone (DILAUDID) injection, HYDROmorphone, traMADol  Time spent: 35 minutes  Author: Berle Mull, MD [facility] 08/07/2020 5:29 PM  To reach On-call, see care teams to locate the attending and reach out via [email or web address]. Between 7PM-7AM, please contact night-coverage If you still have difficulty reaching the attending provider, please page the [facility] (Director on Call) for [facility] on amion for assistance.

## 2020-08-07 NOTE — Progress Notes (Signed)
PHARMACY CONSULT NOTE FOR:  OUTPATIENT  PARENTERAL ANTIBIOTIC THERAPY (OPAT)  Indication: bacteremia Regimen: Vancomycin 750mg  IV q24h End date: 08/30/20  IV antibiotic discharge orders are pended. To discharging provider:  please sign these orders via discharge navigator,  Select New Orders & click on the button choice - Manage This Unsigned Work.     Dwayne A. 09/01/20, PharmD, BCPS, [facility] Clinical Pharmacist [place] Please utilize Amion for appropriate phone number to reach the unit pharmacist [facility])   08/07/2020, 12:40 PM

## 2020-08-07 NOTE — Progress Notes (Signed)
[place] for Infectious Disease  Date of Admission:  08/14/2020           Reason for visit: Follow up on MRSA bacteremia  Current antibiotics: Vancomycin 3/21--present  Previous antibiotics: Daptomycin 3/17--3/20 Cefazolin 3/16--3/17   ASSESSMENT:    1. MRSA bacteremia: presumably from a left gluteal wound with no evidence of abscess seen on imaging and no debridement recommended by surgery.  TEE without endocarditis and cultures negative as of 08/02/20 2. AKI on CKD 3. Cognitive impairment/delirium  PLAN:    . Continue with vancomycin per pharmacy recommendations . Plan for 4 weeks of therapy from negative blood cultures.  End date = 08/30/20 . Wound care, glycemic control . Hospitalist to d/w nephrology regarding placing PICC line given CKD.  Okay from ID standpoint to place . Will sign off, please call as needed . See OPAT note  Diagnosis: MRSA bacteremia  Culture Result: MRSA  No Known Allergies  OPAT Orders Discharge antibiotics to be given via PICC line Discharge antibiotics: Per pharmacy protocol Vancomycin Aim for Vancomycin trough 15-20 or AUC 400-550 (unless otherwise indicated) Duration: 4 weeks End Date: 08/30/20  [facility] Care Per Protocol:  Home health RN for IV administration and teaching; PICC line care and labs.    Labs weekly while on IV antibiotics: _x_ CBC with differential x__ BMP __ CMP __ CRP __ ESR _x_ Vancomycin trough __ CK  _x_ Please pull PIC at completion of IV antibiotics __ Please leave PIC in place until doctor has seen patient or been notified  Fax weekly labs to [phone]  Clinic Follow Up Appt: 08/22/20 @ 230pm with Dr Tommy Medal       Principal Problem:   MRSA bacteremia Active Problems:   COPD, severe ([place])   Leg edema, right- Greater than Left leg   Essential hypertension   Acute kidney injury (nontraumatic) (H[facility])   CHF (congestive heart failure) ([place])   Abdominal pain   Controlled type 2  diabetes mellitus with hyperglycemia ([place])   Pressure injury of skin    MEDICATIONS:    Scheduled Meds: . atorvastatin  20 mg Oral Daily  . carvedilol  12.5 mg Oral BID WC  . donepezil  10 mg Oral QHS  . enoxaparin (LOVENOX) injection  40 mg Subcutaneous Daily  . insulin aspart  0-9 Units Subcutaneous Q4H  . insulin detemir  5 Units Subcutaneous Daily  . OLANZapine  5 mg Oral QHS  . pantoprazole  40 mg Oral Daily  . polyethylene glycol  17 g Oral Daily  . vitamin B-12  1,000 mcg Oral Daily   Continuous Infusions: . sodium chloride Stopped (07/21/2020 1142)  . dextrose 5 % and 0.45% NaCl 75 mL/hr at 08/07/20 0431  . vancomycin 750 mg (07/24/2020 1653)   PRN Meds:.acetaminophen **OR** acetaminophen, albuterol, haloperidol **OR** haloperidol lactate, haloperidol lactate, HYDROmorphone (DILAUDID) injection, HYDROmorphone, traMADol  SUBJECTIVE:   Remain delirious, not conversant.  Review of Systems  Unable to perform ROS: Mental acuity      OBJECTIVE:   Blood pressure (!) 125/35, pulse 78, temperature 98.2 F (36.8 C), resp. rate 17, height 5' 5" (1.651 m), weight 71.2 kg, SpO2 99 %. Body mass index is 26.13 kg/m.  Physical Exam Constitutional:      General: He is not in acute distress.    Appearance: Normal appearance.  HENT:     Head: Normocephalic and atraumatic.  Eyes:     Extraocular Movements: Extraocular movements intact.  Conjunctiva/sclera: Conjunctivae normal.  Pulmonary:     Effort: Pulmonary effort is normal. No respiratory distress.  Neurological:     General: No focal deficit present.     Mental Status: He is alert. He is disoriented.      Lab Results: Lab Results  Component Value Date   WBC 12.5 (H) 08/07/2020   HGB 12.4 (L) 08/07/2020   HCT 43.3 08/07/2020   MCV 96.2 08/07/2020   PLT 200 08/07/2020    Lab Results  Component Value Date   NA 139 08/07/2020   K 4.9 08/07/2020   CO2 28 08/07/2020   GLUCOSE 214 (H) 08/07/2020   BUN 37  (H) 08/07/2020   CREATININE 1.77 (H) 08/07/2020   CALCIUM 8.2 (L) 08/07/2020   GFRNONAA 38 (L) 08/07/2020   GFRAA 49 (L) 04/07/2019    Lab Results  Component Value Date   ALT 11 07/31/2020   AST 19 07/31/2020   ALKPHOS 60 07/31/2020   BILITOT 1.0 07/31/2020    No results found for: CRP     Component Value Date/Time   ESRSEDRATE 4 11/09/2010 0448     I have reviewed the micro and lab results in Epic.  Imaging: ECHO TEE  Result Date: 08/02/2020    TRANSESOPHOGEAL ECHO REPORT   Patient Name:   Ivan Parks [facility] Date of Exam: 08/09/2020 Medical Rec #:  [id number]     Height:       65.0 in Accession #:    [id number]    Weight:       157.0 lb Date of Birth:  November 14, 1940    BSA:          1.785 m Patient Age:    80 years      BP:           98/55 mmHg Patient Gender: M             HR:           77 bpm. Exam Location:  Inpatient Procedure: Transesophageal Echo Indications:     Bacteremia  History:         Patient has prior history of Echocardiogram examinations, most                  recent 08/01/2020. CHF, COPD; Risk Factors:Current Smoker,                  Dyslipidemia, Hypertension and Diabetes.  Sonographer:     Bernadene Person RDCS Referring Phys:  [id number] Tami Lin [facility] Diagnosing Phys: Skeet Latch MD PROCEDURE: After discussion of the risks and benefits of a TEE, an informed consent was obtained from the patient. The transesophogeal probe was passed without difficulty through the esophogus of the patient. Local oropharyngeal anesthetic was provided with Cetacaine. Sedation performed by different physician. The patient was monitored while under deep sedation. Anesthestetic sedation was provided intravenously by Anesthesiology: 111.32m of Propofol, 1068mof Lidocaine. The patient's vital signs; including heart rate, blood pressure, and oxygen saturation; remained stable throughout the procedure. The patient developed no complications during the procedure. IMPRESSIONS  1. Left ventricular  ejection fraction, by estimation, is 60 to 65%. The left ventricle has normal function. The left ventricle has no regional wall motion abnormalities. There is mild concentric left ventricular hypertrophy.  2. Right ventricular systolic function is normal. The right ventricular size is normal.  3. No left atrial/left atrial appendage thrombus was detected.  4. The mitral valve is normal in structure. Trivial mitral valve  regurgitation. No evidence of mitral stenosis.  5. 3D images of the tricuspid valve were obtained. Tricuspid valve regurgitation is mild to moderate.  6. The aortic valve is tricuspid. There is mild thickening of the aortic valve. Aortic valve regurgitation is mild to moderate. No aortic stenosis is present. Conclusion(s)/Recommendation(s): No evidence of vegetation/infective endocarditis on this transesophageal echocardiogram. FINDINGS  Left Ventricle: Left ventricular ejection fraction, by estimation, is 60 to 65%. The left ventricle has normal function. The left ventricle has no regional wall motion abnormalities. The left ventricular internal cavity size was normal in size. There is  mild concentric left ventricular hypertrophy. Right Ventricle: The right ventricular size is normal. No increase in right ventricular wall thickness. Right ventricular systolic function is normal. Left Atrium: Left atrial size was normal in size. No left atrial/left atrial appendage thrombus was detected. Right Atrium: Right atrial size was normal in size. Pericardium: There is no evidence of pericardial effusion. Mitral Valve: The mitral valve is normal in structure. Trivial mitral valve regurgitation. No evidence of mitral valve stenosis. Tricuspid Valve: 3D images of the tricuspid valve were obtained. The tricuspid valve is normal in structure. Tricuspid valve regurgitation is mild to moderate. No evidence of tricuspid stenosis. Aortic Valve: The aortic valve is tricuspid. There is mild thickening of the aortic  valve. Aortic valve regurgitation is mild to moderate. No aortic stenosis is present. Pulmonic Valve: The pulmonic valve was normal in structure. Pulmonic valve regurgitation is not visualized. No evidence of pulmonic stenosis. Aorta: The aortic root is normal in size and structure. There is minimal (Grade I) atheroma plaque involving the descending aorta. IAS/Shunts: No atrial level shunt detected by color flow Doppler.  LEFT VENTRICLE PLAX 2D LVOT diam:     2.00 cm LVOT Area:     3.14 cm  TRICUSPID VALVE TR Peak grad:   37.0 mmHg TR Vmax:        304.00 cm/s  SHUNTS Systemic Diam: 2.00 cm Skeet Latch MD Electronically signed by Skeet Latch MD Signature Date/Time: 08/02/2020/3:07:23 PM    Final      Imaging independently reviewed in Epic.    [facility] for [facility]ious [facility] [phone] pager 08/07/2020, 12:19 PM  I spent greater than 35 minutes with the patient including greater than 50% of time in face to face counsel of the patient and in coordination of their care.

## 2020-08-07 NOTE — Consult Note (Signed)
Consultation Note Date: 08/07/2020   Patient Name: Ivan Parks  DOB: August 13, 1940  MRN: [id number]  Age / Sex: 80 y.o., male  PCP: Laurann Montana, MD Referring Physician: Rolly Salter, MD  Reason for Consultation: Establishing goals of care and Psychosocial/spiritual support  HPI/Patient Profile: 80 y.o. male   admitted on 08/04/2020 with   history of diabetes mellitus type 2, diastolic CHF, cognitive deficit, chronic venous insufficiency with chronic edema, chronic kidney disease stage III baseline creatinine around 1.3, COPD on home oxygen presents to the ER for the second time in the last 1 month with complaints of right groin pain.    Reported poor po intake.  Today is day 7 of this hospitalization  Patient with left gluteal cellulitis/MRSA bacteremia, continued poor oral intake and overall failure to thrive.  Patient does not have capacity to make medical decisions.  Family face treatment option decisions, advanced directive decisions and anticipatory care needs.    Clinical Assessment and Goals of Care:   This NP Lorinda Creed reviewed medical records, received report from team, assessed the patient and then meet at the patient's bedside and spoke to his wife and daughter/Crytal ( only biological daughter)  by phone  to discuss diagnosis, prognosis, GOC, EOL wishes disposition and options.   Concept of Palliative Care was introduced as specialized medical care for people and their families living with serious illness.  If focuses on providing relief from the symptoms and stress of a serious illness.  The goal is to improve quality of life for both the patient and the family.  Values and goals of care important to patient and family were attempted to be elicited.  Created space and opportunity for patient  and family to explore thoughts and feelings regarding current medical situation.    Patient's wife quickly became overwhelmed with conversation and asked that I speak directly to her daughter Crystal.   Aggie Cosier has insight into her father's condition, she works as an Engineer, manufacturing.  Both patient's wife  and daughter verbalize that they remain hopeful improved for improvement and are open to all offered and available medical interventions to prolong life.   A  discussion was had today regarding advanced directives.  Concepts specific to code status, artifical feeding and hydration, continued IV antibiotics and rehospitalization was had.    The difference between a aggressive medical intervention path  and a palliative comfort care path for this patient at this time was had.     Questions and concerns addressed.  Patient  encouraged to call with questions or concerns.     PMT will continue to support holistically.          No documented healthcare power of attorney or advanced care planning documents..  wife is his next of kin. Wife tends to defer conversation and decisions to her daughter Crystal     SUMMARY OF RECOMMENDATIONS    Code Status/Advance Care Planning:  DNR-documented today    Both wife and daughter communicated understanding and desire for DNR/DNI  Palliative Prophylaxis:   Aspiration, Bowel Regimen, Delirium Protocol, Frequent Pain Assessment and Oral Care  Additional Recommendations (Limitations, Scope, Preferences):  Full Scope Treatment  Psycho-social/Spiritual:   Desire for further Chaplaincy support:no  Additional Recommendations: Education on Hospice  Prognosis:   Unable to determine  Discharge Planning:  Family worry with patient's continued decline that he will not be able to return to previous living situation, living at home with his wife.   To Be Determined      Primary Diagnoses: Present on Admission: . Abdominal pain . COPD, severe (HCC) . Leg edema, right- Greater than Left leg . Essential hypertension .  Controlled type 2 diabetes mellitus with hyperglycemia (HCC) . MRSA bacteremia   I have reviewed the medical record, interviewed the patient and family, and examined the patient. The following aspects are pertinent.  Past Medical History:  Diagnosis Date  . Acute respiratory failure (HCC)   . AKI (acute kidney injury) (HCC) 04/01/2016  . Bilateral lower extremity edema   . Bronchitis   . Chest pain 12/02/2017  . CHF (congestive heart failure) (HCC)   . Chronic respiratory failure (HCC) 11/26/2014  . COPD (chronic obstructive pulmonary disease) (HCC)   . 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 Dementia (HCC)   . Diabetes (HCC) 05/02/2016  . DM (diabetes mellitus) (HCC)   . Essential hypertension 03/06/2015   hypertension   . HTN (hypertension)   . Hyperlipidemia   . Hypotension 03/31/2016  . Hypoxemia   . Leg edema, right- Greater than Left leg 07/05/2014   Lower extremity edema   . OSA (obstructive sleep apnea)   . Peripheral vascular disease (HCC)   . Tobacco abuse    Social History   Socioeconomic History  . Marital status: Married    Spouse name: Not on file  . Number of children: 7  . Years of education: Not on file  . Highest education level: Not on file  Occupational History  . Occupation: retired  Tobacco Use  . Smoking status: Former Smoker    Packs/day: 1.50    Years: 50.00    Pack years: 75.00    Types: Cigarettes    Quit date: 11/04/2010    Years since quitting: 9.7  . Smokeless tobacco: Never Used  Vaping Use  . Vaping Use: Never used  Substance and Sexual Activity  . Alcohol use: No    Alcohol/week: 0.0 standard drinks  . Drug use: No  . Sexual activity: Not on file  Other Topics Concern  . Not on file  Social History Narrative   Lives with wife and 2 adult children who help with driving errands, cooking,  cleaning, hands on care as needed.    Daughter [name] does daily dressing changes to client's bilateral lower extremeties   Social Determinants of Health   Financial Resource Strain: Not on file  Food Insecurity: No Food Insecurity  . Worried About Programme researcher, broadcasting/film/video in the Last Year: Never true  . Ran Out of Food in the Last Year: Never true  Transportation Needs: No Transportation Needs  . Lack of Transportation (Medical): No  . Lack of Transportation (Non-Medical): No  Physical Activity: Not on file  Stress: Not on file  Social Connections: Not on file   Family History  Problem Relation Age of Onset  .  Diabetes Mother   . Hypertension Mother   . Peripheral vascular disease Mother        amputation  . Diabetes Father        Right Leg Amputation-Gangrene  . Hypertension Father   . Peripheral vascular disease Father        amputation  . Cancer Father        Lead Poison-Ca  . Pneumonia Father   . Diabetes Sister   . Hypertension Sister   . Diabetes Daughter   . Hypertension Daughter    Scheduled Meds: . atorvastatin  20 mg Oral Daily  . carvedilol  12.5 mg Oral BID WC  . donepezil  10 mg Oral QHS  . enoxaparin (LOVENOX) injection  40 mg Subcutaneous Daily  . insulin aspart  0-9 Units Subcutaneous Q4H  . insulin detemir  5 Units Subcutaneous Daily  . OLANZapine  5 mg Oral QHS  . pantoprazole  40 mg Oral Daily  . polyethylene glycol  17 g Oral Daily  . vitamin B-12  1,000 mcg Oral Daily   Continuous Infusions: . sodium chloride Stopped (10-01-20 1142)  . dextrose 5 % and 0.45% NaCl 75 mL/hr at 08/07/20 0431  . vancomycin 750 mg (10-01-20 1653)   PRN Meds:.acetaminophen **OR** acetaminophen, albuterol, haloperidol **OR** haloperidol lactate, haloperidol lactate, HYDROmorphone (DILAUDID) injection, HYDROmorphone, traMADol Medications Prior to Admission:  Prior to Admission medications   Medication Sig Start Date End Date Taking? Authorizing Provider  acetaminophen  (TYLENOL) 325 MG tablet Take 2 tablets (650 mg total) by mouth every 6 (six) hours as needed for mild pain (or Fever >/= 101). 11/22/18  Yes Buriev, Isaiah SergeUlugbek N, MD  albuterol (PROVENTIL) (2.5 MG/3ML) 0.083% nebulizer solution TAKE 3 MLS (2.5 MG TOTAL) BY NEBULIZATION EVERY 6 (SIX) HOURS AS NEEDED FOR WHEEZING OR SHORTNESS OF BREATH. 01/13/19  Yes Byrum, Les Pouobert S, MD  albuterol (VENTOLIN HFA) 108 (90 Base) MCG/ACT inhaler Inhale 2 puffs into the lungs every 6 (six) hours as needed for wheezing or shortness of breath.   Yes [provider]  atorvastatin (LIPITOR) 20 MG tablet Take 1 tablet (20 mg total) by mouth daily at 6 PM. Patient taking differently: Take 20 mg by mouth daily. 11/22/18  Yes Buriev, Isaiah SergeUlugbek N, MD  donepezil (ARICEPT) 10 MG tablet Take 10 mg by mouth at bedtime. 07/23/20  Yes [provider]  furosemide (LASIX) 80 MG tablet Take 80 mg by mouth daily. 05/23/20  Yes [provider]  insulin degludec (TRESIBA FLEXTOUCH) 100 UNIT/ML FlexTouch Pen Inject 0.15 mLs (15 Units total) into the skin daily. 01/15/20  Yes Romero BellingEllison, Sean, MD  lisinopril (ZESTRIL) 40 MG tablet Take 40 mg by mouth daily. 03/31/19  Yes [provider]  magnesium hydroxide (MILK OF MAGNESIA) 400 MG/5ML suspension Take 30 mLs by mouth daily as needed for mild constipation.   Yes [provider]  OXYGEN Inhale 2.5 L into the lungs continuous.   Yes [provider]  pantoprazole (PROTONIX) 40 MG tablet Take 1 tablet (40 mg total) by mouth daily. 12/17/17  Yes Turner, Cornelious Bryantraci R, MD  phenylephrine (SUDAFED PE) 10 MG TABS tablet Take 10 mg by mouth every 6 (six) hours as needed (allergies).   Yes [provider]  predniSONE (DELTASONE) 10 MG tablet Take 1 tablet (10 mg total) by mouth daily with breakfast. 07/11/20  Yes Nadara Mustarduda, Marcus V, MD  Tiotropium Bromide-Olodaterol (STIOLTO RESPIMAT) 2.5-2.5 MCG/ACT AERS USE 2 PUFFS ONCE A DAY Patient taking differently: Inhale 2  puffs into  the lungs daily. 01/10/19  Yes Leslye Peer, MD  traMADol (ULTRAM) 50 MG tablet Take 50 mg by mouth every 6 (six) hours as needed for pain. 07/29/20  Yes [provider]  vitamin B-12 (CYANOCOBALAMIN) 1000 MCG tablet Take 1,000 mcg by mouth daily.   Yes [provider]  carvedilol (COREG) 12.5 MG tablet Take 1 tablet (12.5 mg total) by mouth 2 (two) times daily with a meal. 11/22/18   Buriev, Isaiah Serge, MD  Insulin Pen Needle (PEN NEEDLES) 31G X 6 MM MISC 1 each by Does not apply route daily. E11.9 01/19/20   Romero Belling, MD   No Known Allergies Review of Systems  Unable to perform ROS: Mental status change    Physical Exam Constitutional:      Appearance: He is normal weight. He is ill-appearing.  Cardiovascular:     Rate and Rhythm: Normal rate.  Skin:    General: Skin is warm and dry.  Neurological:     Mental Status: He is alert.  Psychiatric:        Behavior: Behavior is agitated.     Vital Signs: BP (!) 125/35   Pulse 78   Temp 98.2 F (36.8 C)   Resp 17   Ht 5\' 5"  (1.651 m)   Wt 71.2 kg   SpO2 99%   BMI 26.13 kg/m  Pain Scale: 0-10   Pain Score: 8    SpO2: SpO2: 99 % O2 Device:SpO2: 99 % O2 Flow Rate: .O2 Flow Rate (L/min): 3 L/min  IO: Intake/output summary:   Intake/Output Summary (Last 24 hours) at 08/07/2020 1435 Last data filed at 08/07/2020 1300 Gross per 24 hour  Intake 234.85 ml  Output 475 ml  Net -240.15 ml    LBM: Last BM Date: 08/03/20 Baseline Weight: Weight: 71.2 kg Most recent weight: Weight: 71.2 kg     Palliative Assessment/Data:  30 % at best   Discussed with Dr 08/05/20   Time In: 1400 Time Out: 1515 Time Total: 75 minutes Greater than 50%  of this time was spent counseling and coordinating care related to the above assessment and plan.  Signed by: 1516, NP   Please contact Palliative Medicine Team phone at [phone] for questions and concerns.  For individual provider: See  [phone]

## 2020-08-07 NOTE — Progress Notes (Signed)
Pt is refusing to take medication. He is spitting them out and closes his mouth tight. Pt is swinging and hitting.

## 2020-08-07 NOTE — Progress Notes (Signed)
Pt will not open his mouth for medication. Swatting when trying to give insulin.

## 2020-08-08 ENCOUNTER — Inpatient Hospital Stay (HOSPITAL_COMMUNITY): Payer: HMO

## 2020-08-08 ENCOUNTER — Encounter (HOSPITAL_COMMUNITY): Payer: Self-pay | Admitting: Cardiovascular Disease

## 2020-08-08 DIAGNOSIS — R7881 Bacteremia: Secondary | ICD-10-CM | POA: Diagnosis not present

## 2020-08-08 DIAGNOSIS — R638 Other symptoms and signs concerning food and fluid intake: Secondary | ICD-10-CM

## 2020-08-08 DIAGNOSIS — E1165 Type 2 diabetes mellitus with hyperglycemia: Secondary | ICD-10-CM | POA: Diagnosis not present

## 2020-08-08 DIAGNOSIS — B9562 Methicillin resistant Staphylococcus aureus infection as the cause of diseases classified elsewhere: Secondary | ICD-10-CM | POA: Diagnosis not present

## 2020-08-08 DIAGNOSIS — L89329 Pressure ulcer of left buttock, unspecified stage: Secondary | ICD-10-CM | POA: Diagnosis not present

## 2020-08-08 DIAGNOSIS — R627 Adult failure to thrive: Secondary | ICD-10-CM | POA: Diagnosis not present

## 2020-08-08 DIAGNOSIS — F0391 Unspecified dementia with behavioral disturbance: Secondary | ICD-10-CM

## 2020-08-08 LAB — CBC WITH DIFFERENTIAL/PLATELET
Abs Immature Granulocytes: 0.11 10*3/uL — ABNORMAL HIGH (ref 0.00–0.07)
Basophils Absolute: 0 10*3/uL (ref 0.0–0.1)
Basophils Relative: 0 %
Eosinophils Absolute: 0.2 10*3/uL (ref 0.0–0.5)
Eosinophils Relative: 1 %
HCT: 41 % (ref 39.0–52.0)
Hemoglobin: 11.6 g/dL — ABNORMAL LOW (ref 13.0–17.0)
Immature Granulocytes: 1 %
Lymphocytes Relative: 7 %
Lymphs Abs: 1 10*3/uL (ref 0.7–4.0)
MCH: 27.6 pg (ref 26.0–34.0)
MCHC: 28.3 g/dL — ABNORMAL LOW (ref 30.0–36.0)
MCV: 97.4 fL (ref 80.0–100.0)
Monocytes Absolute: 1.1 10*3/uL — ABNORMAL HIGH (ref 0.1–1.0)
Monocytes Relative: 8 %
Neutro Abs: 11.8 10*3/uL — ABNORMAL HIGH (ref 1.7–7.7)
Neutrophils Relative %: 83 %
Platelets: 198 10*3/uL (ref 150–400)
RBC: 4.21 MIL/uL — ABNORMAL LOW (ref 4.22–5.81)
RDW: 12.5 % (ref 11.5–15.5)
WBC: 14.1 10*3/uL — ABNORMAL HIGH (ref 4.0–10.5)
nRBC: 0 % (ref 0.0–0.2)

## 2020-08-08 LAB — COMPREHENSIVE METABOLIC PANEL
ALT: 7 U/L (ref 0–44)
AST: 12 U/L — ABNORMAL LOW (ref 15–41)
Albumin: 1.7 g/dL — ABNORMAL LOW (ref 3.5–5.0)
Alkaline Phosphatase: 77 U/L (ref 38–126)
Anion gap: 4 — ABNORMAL LOW (ref 5–15)
BUN: 35 mg/dL — ABNORMAL HIGH (ref 8–23)
CO2: 33 mmol/L — ABNORMAL HIGH (ref 22–32)
Calcium: 7.8 mg/dL — ABNORMAL LOW (ref 8.9–10.3)
Chloride: 101 mmol/L (ref 98–111)
Creatinine, Ser: 1.62 mg/dL — ABNORMAL HIGH (ref 0.61–1.24)
GFR, Estimated: 43 mL/min — ABNORMAL LOW (ref >60)
Glucose, Bld: 209 mg/dL — ABNORMAL HIGH (ref 70–99)
Potassium: 4.9 mmol/L (ref 3.5–5.1)
Sodium: 138 mmol/L (ref 135–145)
Total Bilirubin: 0.3 mg/dL (ref 0.3–1.2)
Total Protein: 6.4 g/dL — ABNORMAL LOW (ref 6.5–8.1)

## 2020-08-08 LAB — GLUCOSE, CAPILLARY
Glucose-Capillary: 157 mg/dL — ABNORMAL HIGH (ref 70–99)
Glucose-Capillary: 190 mg/dL — ABNORMAL HIGH (ref 70–99)
Glucose-Capillary: 191 mg/dL — ABNORMAL HIGH (ref 70–99)
Glucose-Capillary: 208 mg/dL — ABNORMAL HIGH (ref 70–99)
Glucose-Capillary: 227 mg/dL — ABNORMAL HIGH (ref 70–99)
Glucose-Capillary: 69 mg/dL — ABNORMAL LOW (ref 70–99)

## 2020-08-08 LAB — T4, FREE: Free T4: 0.76 ng/dL (ref 0.61–1.12)

## 2020-08-08 LAB — AMMONIA: Ammonia: 53 umol/L — ABNORMAL HIGH (ref 9–35)

## 2020-08-08 LAB — TSH: TSH: 0.392 u[IU]/mL (ref 0.350–4.500)

## 2020-08-08 LAB — VANCOMYCIN, TROUGH: Vancomycin Tr: 16 ug/mL (ref 15–20)

## 2020-08-08 LAB — MAGNESIUM: Magnesium: 1.8 mg/dL (ref 1.7–2.4)

## 2020-08-08 LAB — VITAMIN B12: Vitamin B-12: 632 pg/mL (ref 180–914)

## 2020-08-08 LAB — C-REACTIVE PROTEIN: CRP: 15.6 mg/dL — ABNORMAL HIGH (ref <1.0)

## 2020-08-08 MED ORDER — DEXTROSE-NACL 5-0.45 % IV SOLN: INTRAVENOUS | Status: DC

## 2020-08-08 MED ORDER — CHLORHEXIDINE GLUCONATE CLOTH 2 % EX PADS
6.0000 | MEDICATED_PAD | Freq: Every day | CUTANEOUS | Status: DC
  Administered 2020-08-08 – 2020-08-09 (×2): 6 via TOPICAL

## 2020-08-08 MED ORDER — SODIUM CHLORIDE 0.9% FLUSH: 10.0000 mL | INTRAVENOUS | Status: DC | PRN

## 2020-08-08 MED ORDER — SODIUM CHLORIDE 0.9% FLUSH
10.0000 mL | Freq: Two times a day (BID) | INTRAVENOUS | Status: DC
  Administered 2020-08-09 – 2020-08-11 (×2): 10 mL

## 2020-08-08 NOTE — Plan of Care (Signed)
  Problem: Nutrition: Goal: Adequate nutrition will be maintained Outcome: Not Progressing   Problem: Coping: Goal: Level of anxiety will decrease Outcome: Not Progressing   

## 2020-08-08 NOTE — Progress Notes (Signed)
Patient ID: Ivan Parks, male   DOB: [birth year], 80 y.o.   MRN: [id number]  Medical  records reviewed  80 y.o. male   admitted on 07/22/2020 with  history of diabetes mellitus type 2, diastolic CHF, cognitive deficit, chronic venous insufficiency with chronic edema, chronic kidney disease stage III baseline creatinine around 1.3, COPD on home oxygen presents to the ER for the second time in the last 1 month with complaints of right groin pain.   Patient with left gluteal cellulitis/MRSA bacteremia, continued poor oral intake and overall failure to thrive.      This NP visited patient at the bedside as a follow up to  yesterday's GOCs meeting.  Patient is confused, oriented only to person.  Easily agitated.  He does not have capacity to make medical decisions.   I spoke to patient's wife and daughter Ivan Parks by phone this afternoon.  Continued conversation regarding current medical  situation and the patient's continued overall failure to thrive      Education offered on adult failure to thrive and the limitations of medical interventions to prolong quality of life.  We spoke specifically to his poor oral intake and decisions regarding artificial feeding and hydration now on into the future.  Education offered on risks and benefits of tube feeds.    Patient's wife is overwhelmed with situation and defers decisions to her daughter Ivan Parks. Family communicate desire for short term artifical feeding as they continue to hope for improvement.  I shared my worry that with Mr Leiber's confusion it will be difficult to maintain tube without at a minimum mitting him.  They remain hopeful for improvement.  I recommend that as a family they meet with this NP next week in person to further clarify GOCs and next steps in treatment plan.   Ivan Parks agrees.  She will talk with her family.   Discussed with patient the importance of continued conversation with family and the medical providers regarding  overall plan of care and treatment options,  ensuring decisions are within the context of the patients values and GOCs.  Questions and concerns addressed   Discussed with Dr Allena Katz   Total time spent on the unit was 35 minutes  Greater than 50% of the time was spent in counseling and coordination of care  Lorinda Creed NP  Palliative Medicine Team Team Phone # [phone] Pager [phone]

## 2020-08-08 NOTE — Progress Notes (Signed)
Peripherally Inserted Central Catheter Placement  The IV Nurse has discussed with the patient and/or persons authorized to consent for the patient, the purpose of this procedure and the potential benefits and risks involved with this procedure.  The benefits include less needle sticks, lab draws from the catheter, and the patient may be discharged home with the catheter. Risks include, but not limited to, infection, bleeding, blood clot (thrombus formation), and puncture of an artery; nerve damage and irregular heartbeat and possibility to perform a PICC exchange if needed/ordered by physician.  Alternatives to this procedure were also discussed.  Bard Power PICC patient education guide, fact sheet on infection prevention and patient information card has been provided to patient /or left at bedside.    PICC Placement Documentation  PICC Single Lumen 08/08/20 Right Brachial 38 cm 0 cm (Active)  Indication for Insertion or Continuance of Line Home intravenous therapies (PICC only) 08/08/20 1201  Exposed Catheter (cm) 0 cm 08/08/20 1201  Site Assessment Clean;Dry;Intact 08/08/20 1201  Line Status Flushed;Saline locked;Blood return noted 08/08/20 1201  Dressing Type Transparent;Securing device 08/08/20 1201  Dressing Status Clean;Dry;Intact 08/08/20 1201  Antimicrobial disc in place? Yes 08/08/20 1201  Dressing Intervention New dressing 08/08/20 1201  Dressing Change Due 08/15/20 08/08/20 1201       Annett Fabian 08/08/2020, 12:04 PM

## 2020-08-08 NOTE — Progress Notes (Signed)
[facility] Progress Note  Patient: Ivan Parks    JAS:[id number]  DOA: 08/07/2020     Date of Service: the patient was seen and examined on 08/08/2020  Brief hospital course: Past medical history of type II DM, chronic HFpEF, CKD 3B, COPD, chronic respiratory failure on 2 LPM, cognitive deficit, noncompliance, right inguinal hernia. Presents with right groin pain found to have left gluteal cellulitis with MRSA bacteremia. ID was consulted. Currently plan is monitor for further improvement in mentation and nutrition.  Assessment and Plan: 1. Left gluteal cellulitis, MRSA bacteremia: Sepsis POA Met SIRS criteria on admission with tachycardia tachypnea and leukocytosis along with fever. No fluctuance on palpation.  No debridement recommended by General surgery.  Blood cultures positive for MRSA ID was consulted. Initially patient was on daptomycin. Now changed to vancomycin TTE and TEE negative for any vegetation. Repeat cultures so far negative on 3/18. Continue local wound care. Per ID recommend 4 weeks of antibiotics, and 12/30/2020. ID recommends PICC line over tunneled catheter as patient has history of poor compliance and may likely not come back for a catheter removal which will lead to further complication. Also given his poor understanding of his medical condition patient remains a poor candidate for long-term HD. Based on this information we will insert a PICC line.  2. Cognitive impairment, debility, now with acute delirium: Acute metabolic encephalopathy secondary to - Continue delirium precautions, treating pain, prn haldol if agitated/combative. - PT/OT evaluations. Has assistance with wife, son, step son and daughter but has grown less independent of late.  -Appreciate consult palliative care for assistance with goals of care discussions going forward. - Continue aricept, addedzyprexa qHS  CT scan negative for any acute abnormality. Serum creatinine stable.   LFTs stable.  Ammonia level mildly elevated.  Will initiate lactulose. B12 632.  CRP 15.6. TSH and free T4 stable as well.  3. AKI on stage IIIb CKD: Improved and now stable for last 48 hours. decreased po intake, Baseline SCr 1.3-1.5. 1.94 on admission.  - Hold ACE and diuretics  4. Chronic HFpEF HTN LVEF preserved, though G2DD and dilated IVC on TTE 3/17.  Given a dose of lasix due to leg swelling, though Cr bumped so will not continue this at this time with limited po intake. family reports steady decline of late.  Blood pressure currently controlled.  Continue Coreg - Holding diuretic, ACEi with AKI as above.   5. Hydrocele, right groin pain, right inguinal hernia:  Hernia is reducible, though may be contributing to some mild discomfort.  - Supportive care - Pt apparently has surgical follow up planned for hernia management.  6.  Type 2 diabetes mellitus uncontrolled with hyperglycemia with renal complication and long-term insulin use On sliding scale insulin right now. Globin A1c trending down. Monitor.  7. Venous insufficiency:  No DVT on U/S - Elevate legs.  8. COPD: Severe  baseline but no exacerbation.  - Continue supplemental oxygen   9.  Poor p.o. intake Likely from AMS. Monitor. Minimal oral intake in the last 48 hours. As of right now family would like to attempt temporary feeding tube.  Patient most likely will not maintain it given his encephalopathy.  Will order cortrak tomorrow.  10.  Left buttocks pressure ulcer stage II POA Continue foam dressing  Pressure Injury 07/31/20 Buttocks Left Stage 2 -  Partial thickness loss of dermis presenting as a shallow open injury with a red, pink wound bed without slough. red (Active)  07/31/20 4193  Location: Buttocks  Location Orientation: Left  Staging: Stage 2 -  Partial thickness loss of dermis presenting as a shallow open injury with a red, pink wound bed without slough.  Wound Description  (Comments): red  Present on Admission: Yes     Pressure Injury 07/31/20 Buttocks Left Unstageable - Full thickness tissue loss in which the base of the injury is covered by slough (yellow, tan, gray, green or brown) and/or eschar (tan, brown or black) in the wound bed. surrounding tissue hard red (Active)  07/31/20 0529  Location: Buttocks  Location Orientation: Left  Staging: Unstageable - Full thickness tissue loss in which the base of the injury is covered by slough (yellow, tan, gray, green or brown) and/or eschar (tan, brown or black) in the wound bed.  Wound Description (Comments): surrounding tissue hard red  Present on Admission: Yes     Diet: Regular diet DVT Prophylaxis:   enoxaparin (LOVENOX) injection 40 mg Start: 08/07/20 1000    Advance goals of care discussion: DNR  Family Communication: no family was present at bedside, at the time of interview.   Disposition:  Status is: Inpatient  Remains inpatient appropriate because:IV treatments appropriate due to intensity of illness or inability to take PO   Dispo: The patient is from: Home              Anticipated d/c is to: SNF              Patient currently is not medically stable to d/c.   Difficult to place patient Yes  Subjective: No nausea or vomiting but no fever no chills.  No chest pain. no Abdominal pain.  More sleepy today.  Incoherent speech today.  No other focal deficit.  Physical Exam:  General: Appear in mild distress, no Rash; Oral Mucosa Clear, moist. no Abnormal Neck Mass Or lumps, Conjunctiva normal  Cardiovascular: S1 and S2 Present, no Murmur, Respiratory: good respiratory effort, Bilateral Air entry present and CTA, no Crackles, no wheezes Abdomen: Bowel Sound present, Soft and no tenderness Extremities: no Pedal edema Neurology: alert and not oriented to time, place, and person affect anxious. no new focal deficit Gait not checked due to patient safety concerns  Vitals:   08/07/20 2045  08/08/20 0501 08/08/20 1004 08/08/20 1637  BP: (!) 150/93 134/80 (!) 118/58 (!) 133/99  Pulse: 90 (!) 57 60 78  Resp: '16 16 19 19  ' Temp: 98.4 F (36.9 C) 98.2 F (36.8 C) 97.6 F (36.4 C) 98.2 F (36.8 C)  TempSrc:   Oral   SpO2: 96% 100% 98% 97%  Weight: 71.2 kg     Height:        Intake/Output Summary (Last 24 hours) at 08/08/2020 1939 Last data filed at 08/08/2020 1700 Gross per 24 hour  Intake 1200 ml  Output 850 ml  Net 350 ml   Filed Weights   07/29/2020 1038 07/22/2020 2050 08/07/20 2045  Weight: 71.2 kg 71.2 kg 71.2 kg    Data Reviewed: I have personally reviewed and interpreted daily labs, tele strips, imaging. I reviewed all nursing notes, pharmacy notes, vitals, pertinent old records I have discussed plan of care as described above with RN and patient/family.  CBC: Recent Labs  Lab 08/02/20 0557 08/04/20 0304 08/05/20 0742 08/05/2020 0323 08/07/20 0454 08/08/20 1400  WBC 16.5* 18.4* 15.1* 15.8* 12.5* 14.1*  NEUTROABS 13.9*  --   --   --   --  11.8*  HGB 12.1* 12.3* 12.5* 11.4* 12.4* 11.6*  HCT 40.1 40.0 40.3 39.1 43.3 41.0  MCV 91.6 90.9 90.2 93.3 96.2 97.4  PLT 186 210 243 169 200 160   Basic Metabolic Panel: Recent Labs  Lab 08/04/20 0304 08/05/20 0742 07/20/2020 0323 08/07/20 0454 08/08/20 1400  NA 136 138 137 139 138  K 4.6 4.6 4.8 4.9 4.9  CL 96* 98 99 103 101  CO2 32 32 29 28 33*  GLUCOSE 77 232* 237* 214* 209*  BUN 33* 39* 36* 37* 35*  CREATININE 2.07* 1.87* 1.62* 1.77* 1.62*  CALCIUM 8.5* 8.7* 8.4* 8.2* 7.8*  MG  --   --   --   --  1.8    Studies: CT HEAD WO CONTRAST  Result Date: 08/08/2020 CLINICAL DATA:  Mental status changes of uncertain etiology EXAM: CT HEAD WITHOUT CONTRAST TECHNIQUE: Contiguous axial images were obtained from the base of the skull through the vertex without intravenous contrast. Sagittal and coronal MPR images reconstructed from axial data set. Images were repeated due to patient motion. COMPARISON:  04/07/2019  FINDINGS: Brain: Generalized atrophy. Normal ventricular morphology. No midline shift or mass effect. Small vessel chronic ischemic changes of deep cerebral white matter. Scattered artifacts from beam hardening and motion. No definite intracranial hemorrhage, mass lesion, or evidence of infarction. Vascular: No hyperdense vessels Skull: Intact Sinuses/Orbits: Clear Other: N/A IMPRESSION: Atrophy with small vessel chronic ischemic changes of deep cerebral white matter. No definite acute intracranial abnormalities. Electronically Signed   By: Lavonia Dana M.D.   On: 08/08/2020 17:01    Scheduled Meds: . atorvastatin  20 mg Oral Daily  . carvedilol  12.5 mg Oral BID WC  . Chlorhexidine Gluconate Cloth  6 each Topical Daily  . donepezil  10 mg Oral QHS  . enoxaparin (LOVENOX) injection  40 mg Subcutaneous Daily  . insulin aspart  0-9 Units Subcutaneous Q4H  . OLANZapine  5 mg Oral QHS  . pantoprazole  40 mg Oral Daily  . polyethylene glycol  17 g Oral Daily  . sodium chloride flush  10-40 mL Intracatheter Q12H  . vitamin B-12  1,000 mcg Oral Daily   Continuous Infusions: . vancomycin 750 mg (08/08/20 1700)   PRN Meds: acetaminophen **OR** acetaminophen, albuterol, haloperidol **OR** haloperidol lactate, sodium chloride flush  Time spent: 35 minutes  Author: Berle Mull, MD [facility] 08/08/2020 7:39 PM  To reach On-call, see care teams to locate the attending and reach out via [email or web address]. Between 7PM-7AM, please contact night-coverage If you still have difficulty reaching the attending provider, please page the [facility] (Director on Call) for [facility] on amion for assistance.

## 2020-08-08 NOTE — Progress Notes (Signed)
  Pharmacy Antibiotic Note  Ivan Parks is a 80 y.o. male admitted on 07/19/2020 with MRSA bacteremia. Pharmacy has been consulted for vancomycin dosing.  Vancomycin trough obtained 3/24 PM is therapeutic at 16 mcg/ml on current dose of Vancomycin 750mg  IV every 24 hours.  SCr is improving, currently down to 1.62. Will continue current dosing and no changes needed to OPAT.  Plan: Continue vancomycin 750mg  IV q24h  Monitor SCr and levels as appropriately ordered - OPAT orders are in place for discharge.   Height: 5\' 5"  (165.1 cm) Weight: 71.2 kg (156 lb 15.5 oz) IBW/kg (Calculated) : 61.5  Temp (24hrs), Avg:98 F (36.7 C), Min:97.6 F (36.4 C), Max:98.4 F (36.9 C)  Recent Labs  Lab 08/04/20 0304 08/05/20 0742 2020-08-19 0323 08/07/20 0454 08/08/20 1400  WBC 18.4* 15.1* 15.8* 12.5* 14.1*  CREATININE 2.07* 1.87* 1.62* 1.77* 1.62*  VANCOTROUGH  --   --   --   --  16    Estimated Creatinine Clearance: 31.6 mL/min (A) (by C-G formula based on SCr of 1.62 mg/dL (H)).    No Known Allergies  Antimicrobials this admission: Daptomycin 3/17>>3/21 Vancomycin 3/21>>   Microbiology results: 3/16 BC: MRSA 3/18 BC: ngtd  Thank you for allowing pharmacy to be a part of this patient's care.  4/21, PharmD, BCPS, BCCCP Clinical Pharmacist Please refer to [facility] for [facility] Pharmacy numbers 08/08/2020 4:26 PM

## 2020-08-09 DIAGNOSIS — R7881 Bacteremia: Secondary | ICD-10-CM | POA: Diagnosis not present

## 2020-08-09 DIAGNOSIS — B9562 Methicillin resistant Staphylococcus aureus infection as the cause of diseases classified elsewhere: Secondary | ICD-10-CM | POA: Diagnosis not present

## 2020-08-09 DIAGNOSIS — L89329 Pressure ulcer of left buttock, unspecified stage: Secondary | ICD-10-CM | POA: Diagnosis not present

## 2020-08-09 DIAGNOSIS — E1165 Type 2 diabetes mellitus with hyperglycemia: Secondary | ICD-10-CM | POA: Diagnosis not present

## 2020-08-09 DIAGNOSIS — E43 Unspecified severe protein-calorie malnutrition: Secondary | ICD-10-CM | POA: Insufficient documentation

## 2020-08-09 LAB — GLUCOSE, CAPILLARY
Glucose-Capillary: 139 mg/dL — ABNORMAL HIGH (ref 70–99)
Glucose-Capillary: 145 mg/dL — ABNORMAL HIGH (ref 70–99)
Glucose-Capillary: 151 mg/dL — ABNORMAL HIGH (ref 70–99)
Glucose-Capillary: 167 mg/dL — ABNORMAL HIGH (ref 70–99)
Glucose-Capillary: 173 mg/dL — ABNORMAL HIGH (ref 70–99)
Glucose-Capillary: 193 mg/dL — ABNORMAL HIGH (ref 70–99)
Glucose-Capillary: 219 mg/dL — ABNORMAL HIGH (ref 70–99)

## 2020-08-09 LAB — LIPID PANEL
Cholesterol: 74 mg/dL (ref 0–200)
HDL: 26 mg/dL — ABNORMAL LOW (ref >40)
LDL Cholesterol: 33 mg/dL (ref 0–99)
Total CHOL/HDL Ratio: 2.8 RATIO
Triglycerides: 77 mg/dL (ref <150)
VLDL: 15 mg/dL (ref 0–40)

## 2020-08-09 MED ORDER — WHITE PETROLATUM EX OINT
TOPICAL_OINTMENT | CUTANEOUS | Status: AC
  Administered 2020-08-09: 0.2
  Filled 2020-08-09: qty 28.35

## 2020-08-09 MED ORDER — FREE WATER
150.0000 mL | Status: DC
  Administered 2020-08-09 – 2020-08-10 (×4): 150 mL

## 2020-08-09 MED ORDER — PROSOURCE TF PO LIQD
45.0000 mL | Freq: Every day | ORAL | Status: DC
  Administered 2020-08-09: 45 mL
  Filled 2020-08-09 (×2): qty 45

## 2020-08-09 MED ORDER — JUVEN PO PACK
1.0000 | PACK | Freq: Two times a day (BID) | ORAL | Status: DC
  Administered 2020-08-09: 1
  Filled 2020-08-09: qty 1

## 2020-08-09 MED ORDER — OSMOLITE 1.5 CAL PO LIQD
1000.0000 mL | ORAL | Status: DC
  Administered 2020-08-09: 1000 mL
  Filled 2020-08-09 (×2): qty 1000

## 2020-08-09 NOTE — Progress Notes (Signed)
Initial Nutrition Assessment  DOCUMENTATION CODES:  Severe malnutrition in context of chronic illness  INTERVENTION:   Monitor magnesium, potassium, and phosphorus daily for at least 3 days, MD to replete as needed, as pt is at risk for refeeding syndrome.  Begin TF via Cortrak: -Osmolite 1.5 @ 25ml/hr, advance by 37ml/hr Q8H until goal rate of 70ml/hr ( ) is reached -9ml Prosource TF daily - free water flush Q4H -1 packet Juven BID, each packet provides 95 calories, 2.5 grams of protein (collagen), and 9.8 grams of carbohydrate (3 grams sugar); also contains 7 grams of L-arginine and L-glutamine, 300 mg vitamin C, 15 mg vitamin E, 1.2 mcg vitamin B-12, 9.5 mg zinc, 200 mg calcium, and 1.5 g  Calcium Beta-hydroxy-Beta-methylbutyrate to support wound healing  At goal, TF will provide 2200 kcals, 101g protein, free water ( total free water with flushes)  NUTRITION DIAGNOSIS:  Severe Malnutrition related to chronic illness as evidenced by severe muscle depletion,severe fat depletion.  GOAL:  Patient will meet greater than or equal to 90% of their needs  MONITOR:  TF tolerance,Skin,Labs,Weight trends,I & O's  REASON FOR ASSESSMENT:  Consult Enteral/tube feeding initiation and management  ASSESSMENT:  Pt admitted with sepsis 2/2 L gluteal cellulitis with MRSA bacteremia. PMH includes type 2 DM, chronic HFpEF, CKD stage 3, COPD, chronic respiratory failure on 2L O2, cognitive deficit, medical noncompliance, and R inguinal hernia.  Pt unable to answer RD questions at this time. All responses unintelligible.  Pt had Cortrak placed today due to little-to-no PO intake throughout admission (Cortrak tip is gastric). As feeding is initiated, monitor magnesium, potassium, and phosphorus daily for at least 3 days, MD to replete as needed, as pt is at risk for refeeding syndrome given pt has not had a meal documented as more than 0% for >1 week. RD will order TF with slow  titration.   Note PMT following.   Reviewed weight history. Pt weighed 78.5kg (172.7lbs) on 02/26/20 and was 71.2kg (156.97 lbs) upon admit. This indicates a 9% weight loss x 5 months, which is insignificant for timeframe.   UOP: x 24hours  Medications: SSI Q4H, protonix, miralax, vitamin B12 Labs reviewed. CBGs [phone]  NUTRITION - FOCUSED PHYSICAL EXAM: Flowsheet Row Most Recent Value  Orbital Region No depletion  Upper Arm Region Severe depletion  Thoracic and Lumbar Region Severe depletion  Buccal Region Severe depletion  Temple Region Mild depletion  Clavicle Bone Region Severe depletion  Clavicle and Acromion Bone Region Severe depletion  Scapular Bone Region Severe depletion  Dorsal Hand Moderate depletion  Patellar Region Severe depletion  Anterior Thigh Region Severe depletion  Posterior Calf Region Severe depletion  Edema (RD Assessment) Mild  [BLE]  Hair Other (Comment)  [thinning]  Eyes Reviewed  Mouth Reviewed  Skin Other (Comment)  [skin is very dry]  Nails Reviewed     Diet Order:   Diet Order            Diet regular Room service appropriate? Yes; Fluid consistency: Thin  Diet effective now                EDUCATION NEEDS:  No education needs have been identified at this time  Skin:  Skin Assessment: Skin Integrity Issues: Skin Integrity Issues:: Stage II,Unstageable Stage II: L buttocks Unstageable: L buttocks  Last BM:  3/24  Height:  Ht Readings from Last 1 Encounters:  07/27/2020 5\' 5"  (1.651 m)  Weight:  Wt Readings from Last 1 Encounters:  08/07/20  71.2 kg  BMI:  Body mass index is 26.12 kg/m.  Estimated Nutritional Needs:  Kcal:  2000-2200 Protein:  100-115 grams Fluid:  >2L    Eugene Gavia, MS, RD, LDN RD pager number and weekend/on-call pager number located in Amion.

## 2020-08-09 NOTE — Progress Notes (Deleted)
Currently has PORT and 18g AC. Declined need for further access at this time. Tomasita Morrow, RN VAST

## 2020-08-09 NOTE — Care Management Important Message (Signed)
Important Message  Patient Details  Name: Ivan Parks MRN: [id number] Date of Birth: 01/13/1941   Medicare Important Message Given:  Yes - Important Message mailed due to current National Emergency  Verbal consent obtained due to current National Emergency    Contact Name: Djon Call Date: 08/09/20  Time: 1346 Phone: [phone] Outcome: No Answer/Busy Important Message mailed to: Patient address on file    [name] 08/09/2020, 1:46 PM

## 2020-08-09 NOTE — Evaluation (Signed)
Occupational Therapy Evaluation and Discharge Summary Patient Details Name: Ivan Parks MRN: [id number] DOB: August 04, 1940 Today's Date: 08/09/2020    History of Present Illness Ivan Parks is a 80 y/o male who reported to ED with complaints of R groin pain and poor appeptite. Pt reported to [facility] in Feb for similar complaints but there were no significant findings. Pt was admitted on 07/18/2020 with abdominal pain. PMH includes dementia, HF, CKD, DM, LE edema, and COPD on 2L O2 at home.   Clinical Impression   Pt admitted with the above diagnosis and has the deficits listed below. Pt is in need of further OT services but is not agreeable at this time.  Pt uncooperative during treatment, swinging at therapist and resisting all mobility and assistance.  Pt stating he does not want therapist in room to do anything and will not get OOB. Pt covered in stool on arrival. Pt cleaned up with much resistance and left in bed with alarm on.  Spoke to MD who stated this is no longer delirium.  Do not feel this pt is currently appropriate for OT services.  If status changes, please reconsult.     Follow Up Recommendations  Supervision/Assistance - 24 hour;SNF    Equipment Recommendations       Recommendations for Other Services       Precautions / Restrictions Precautions Precautions: Fall Precaution Comments: Pt swinging at therapist. Restrictions Weight Bearing Restrictions: No      Mobility Bed Mobility Overal bed mobility: Needs Assistance Bed Mobility: Rolling Rolling: Min assist         General bed mobility comments: Pt only agreed to rolling.    Transfers                 General transfer comment: unable    Balance Overall balance assessment: Needs assistance             Standing balance comment: unable to assess                           ADL either performed or assessed with clinical judgement   ADL Overall ADL's : Needs  assistance/impaired Eating/Feeding: Minimal assistance   Grooming: Wash/dry hands;Wash/dry face;Set up;Sitting   Upper Body Bathing: Moderate assistance   Lower Body Bathing: Total assistance;+2 for physical assistance;Bed level   Upper Body Dressing : Total assistance;Bed level   Lower Body Dressing: Total assistance;Bed level       Toileting- Clothing Manipulation and Hygiene: Total assistance;Bed level         General ADL Comments: Pt would not transfer to side of bed so all adls completed in bed. Pt covered in stool on arrival.  Pt cleaned up and did assist with rolling with much encouragment.     Vision Patient Visual Report: No change from baseline Vision Assessment?: No apparent visual deficits Additional Comments: unsure about vision as pt not cognitively aware enough to accurately test today.     Perception Perception Perception Tested?: No   Praxis Praxis Praxis tested?: Not tested    Pertinent Vitals/Pain Pain Assessment: No/denies pain     Hand Dominance     Extremity/Trunk Assessment Upper Extremity Assessment Upper Extremity Assessment: Overall WFL for tasks assessed   Lower Extremity Assessment Lower Extremity Assessment: Defer to PT evaluation   Cervical / Trunk Assessment Cervical / Trunk Assessment: Kyphotic   Communication Communication Communication: No difficulties   Cognition Arousal/Alertness: Awake/alert Behavior During Therapy: Restless;Agitated  Overall Cognitive Status: History of cognitive impairments - at baseline Area of Impairment: Orientation;Attention;Memory;Safety/judgement;Following commands;Awareness;Problem solving                 Orientation Level: Place;Time;Situation Current Attention Level: Sustained Memory: Decreased recall of precautions;Decreased short-term memory Following Commands: Follows one step commands inconsistently Safety/Judgement: Decreased awareness of safety Awareness: Intellectual Problem  Solving: Slow processing;Decreased initiation;Difficulty sequencing;Requires verbal cues;Requires tactile cues General Comments: No family in room. Pt resistant to all mobility stating nobody here knows what they are doing and threatening to "punch therapist in the face."  Pt did swing at therapist.   General Comments  Pt with pressure sore on bottom.    Exercises     Shoulder Instructions      Home Living Family/patient expects to be discharged to:: Skilled nursing facility                                 Additional Comments: Pt has been cared for at home by family and nurse that comes in 2-3x a week.  Not sure family can handle him at this point and therefore will rec SNF but if he goes home, rec a trial at [facility] if he becomes more cooperative.      Prior Functioning/Environment Level of Independence: Needs assistance  Gait / Transfers Assistance Needed: unsure. Chart states he sits in a chair all day.  Unusre about ambulation. ADL's / Homemaking Assistance Needed: [facility] nurse assists with bathing.  FAmiy assists with most adls.   Comments: Pt poor historian. Per notes from other members of healthcare team, believe he lives with his spouse, son and step son.        OT Problem List: Decreased strength;Decreased range of motion;Impaired balance (sitting and/or standing);Decreased cognition;Decreased safety awareness;Decreased knowledge of use of DME or AE;Decreased knowledge of precautions      OT Treatment/Interventions:      OT Goals(Current goals can be found in the care plan section) Acute Rehab OT Goals Patient Stated Goal: none stated OT Goal Formulation: Patient unable to participate in goal setting  OT Frequency:     Barriers to D/C:            Co-evaluation              AM-PAC OT "6 Clicks" Daily Activity     Outcome Measure Help from another person eating meals?: A Little Help from another person taking care of personal grooming?: A Lot Help from  another person toileting, which includes using toliet, bedpan, or urinal?: Total Help from another person bathing (including washing, rinsing, drying)?: Total Help from another person to put on and taking off regular upper body clothing?: Total Help from another person to put on and taking off regular lower body clothing?: Total 6 Click Score: 9   End of Session Nurse Communication: Mobility status;Other (comment) (uncooperative)  Activity Tolerance: Treatment limited secondary to agitation Patient left: in bed;with call bell/phone within reach;with bed alarm set  OT Visit Diagnosis: Other symptoms and signs involving cognitive function;Other abnormalities of gait and mobility (R26.89)                Time: 0093-8182 OT Time Calculation (min): 21 min Charges:  OT General Charges $OT Visit: 1 Visit OT Evaluation $OT Eval Moderate Complexity: 1 Mod  Hope Budds 08/09/2020, 8:57 AM

## 2020-08-09 NOTE — Progress Notes (Signed)
[facility] Progress Note  Patient: Ivan Parks    LPF:[id number]  DOA: 08/07/2020     Date of Service: the patient was seen and examined on 08/09/2020  Brief hospital course: Past medical history of type II DM, chronic HFpEF, CKD 3B, COPD, chronic respiratory failure on 2 LPM, cognitive deficit, noncompliance, right inguinal hernia. Presents with right groin pain found to have left gluteal cellulitis with MRSA bacteremia. ID was consulted. Currently plan is monitor for further improvement in mentation and nutrition.  Assessment and Plan: 1. Left gluteal cellulitis, MRSA bacteremia: Sepsis POA Met SIRS criteria on admission with tachycardia tachypnea and leukocytosis along with fever. No fluctuance on palpation.  No debridement recommended by General surgery.  Blood cultures positive for MRSA ID was consulted. Initially patient was on daptomycin. Now changed to vancomycin TTE and TEE negative for any vegetation. Repeat cultures so far negative on 3/18. Continue local wound care. Per ID recommend 4 weeks of antibiotics, and 12/30/2020. ID recommends PICC line over tunneled catheter as patient has history of poor compliance and may likely not come back for a catheter removal which will lead to further complication. Also given his poor understanding of his medical condition patient remains a poor candidate for long-term HD. Based on this information PICC line was inserted.  2. Cognitive impairment, dementia. Acute metabolic encephalopathy secondary to sepsis, currently resolved. Continue delirium precautions, treating pain, prn haldol if agitated/combative. PT/OT evaluations. Has assistance with wife, son, step son and daughter but has grown less independent of late.  Appreciate consult palliative care for assistance with goals of care discussions going forward. Continue aricept, addedzyprexa qHS  CT scan negative for any acute abnormality. Serum creatinine stable.  LFTs  stable.  Ammonia level mildly elevated.  Will initiate lactulose. B12 632.  CRP 15.6. TSH and free T4 stable as well.  3. AKI on stage IIIb CKD: Improved and now stable for last 48 hours. decreased po intake, Baseline SCr 1.3-1.5. 1.94 on admission.  Hold ACE and diuretics  4. Chronic HFpEF HTN LVEF preserved, though G2DD and dilated IVC on TTE 3/17.  Given a dose of lasix due to leg swelling, though Cr bumped so will not continue this at this time with limited po intake. family reports steady decline of late.  Blood pressure currently controlled.  Continue Coreg Holding diuretic, ACEi with AKI as above.   5. Hydrocele, right groin pain, right inguinal hernia:  Hernia is reducible, though may be contributing to some mild discomfort.  Supportive care Pt apparently has surgical follow up planned for hernia management.  6. Type 2 diabetes mellitus uncontrolled with hyperglycemia with renal complication and long-term insulin use On sliding scale insulin right now. Globin A1c trending down. Monitor.  7. Venous insufficiency:  No DVT on U/S Elevate legs.  8. COPD: Severe  baseline but no exacerbation.  - Continue supplemental oxygen   9.  Poor p.o. intake Severe protein calorie malnutrition Minimal oral intake in last 4 days. As of right now family would like to attempt temporary feeding tube.  cortrak inserted on 3/25. Initiate on tube feeding. Monitor for refeeding syndrome.  Appreciate dietary consultation.  10.  Left buttocks pressure ulcer stage II POA Continue foam dressing  Pressure Injury 07/31/20 Buttocks Left Stage 2 -  Partial thickness loss of dermis presenting as a shallow open injury with a red, pink wound bed without slough. red (Active)  07/31/20 0529  Location: Buttocks  Location Orientation: Left  Staging: Stage 2 -  Partial thickness loss of dermis presenting as a shallow open injury with a red, pink wound bed without slough.  Wound Description  (Comments): red  Present on Admission: Yes     Pressure Injury 07/31/20 Buttocks Left Unstageable - Full thickness tissue loss in which the base of the injury is covered by slough (yellow, tan, gray, green or brown) and/or eschar (tan, brown or black) in the wound bed. surrounding tissue hard red (Active)  07/31/20 0529  Location: Buttocks  Location Orientation: Left  Staging: Unstageable - Full thickness tissue loss in which the base of the injury is covered by slough (yellow, tan, gray, green or brown) and/or eschar (tan, brown or black) in the wound bed.  Wound Description (Comments): surrounding tissue hard red  Present on Admission: Yes     Diet: Regular diet DVT Prophylaxis:   enoxaparin (LOVENOX) injection 40 mg Start: 08/07/20 1000    Advance goals of care discussion: DNR  Family Communication: no family was present at bedside, at the time of interview.   Disposition:  Status is: Inpatient  Remains inpatient appropriate because:IV treatments appropriate due to intensity of illness or inability to take PO   Dispo: The patient is from: Home              Anticipated d/c is to: SNF              Patient currently is not medically stable to d/c.   Difficult to place patient Yes  Subjective: Minimal oral intake.  No nausea no vomiting or no acute events.  Continues to have agitation and behavioral issues.  Physical Exam:  General: Appear in mild distress, no Rash; Oral Mucosa Clear, moist. no Abnormal Neck Mass Or lumps, Conjunctiva normal  Cardiovascular: S1 and S2 Present, no Murmur, Respiratory: good respiratory effort, Bilateral Air entry present and CTA, no Crackles, no wheezes Abdomen: Bowel Sound present, Soft and no tenderness Extremities: Trace pedal edema Neurology: alert and oriented to time, place, and person affect appropriate. no new focal deficit Gait not checked due to patient safety concerns  Vitals:   08/08/20 2002 08/09/20 0520 08/09/20 0942 08/09/20  1631  BP: (!) 138/95 (!) 128/96 (!) 117/56 118/61  Pulse: 82 69 (!) 57 73  Resp: _0 Temp: 97.6 F (36.4 C) 98 F (36.7 C) 97.9 F (36.6 C) 97.9 F (36.6 C)  TempSrc: Oral Oral  Oral  SpO2: 97% 98% 98% 97%  Weight:      Height:        Intake/Output Summary (Last 24 hours) at 08/09/2020 1900 Last data filed at 08/09/2020 1750 Gross per 24 hour  Intake 1173.33 ml  Output 400 ml  Net 773.33 ml   Filed Weights   07/31/2020 1038 08/01/2020 2050 08/07/20 2045  Weight: 71.2 kg 71.2 kg 71.2 kg    Data Reviewed: I have personally reviewed and interpreted daily labs, tele strips, imaging. I reviewed all nursing notes, pharmacy notes, vitals, pertinent old records I have discussed plan of care as described above with RN and patient/family.  CBC: Recent Labs  Lab 08/04/20 0304 08/05/20 0742 07/16/2020 0323 08/07/20 0454 08/08/20 1400  WBC 18.4* 15.1* 15.8* 12.5* 14.1*  NEUTROABS  --   --   --   --  11.8*  HGB 12.3* 12.5* 11.4* 12.4* 11.6*  HCT 40.0 40.3 39.1 43.3 41.0  MCV 90.9 90.2 93.3 96.2 97.4  PLT 210 243 169 200 329   Basic Metabolic Panel: Recent Labs  Lab 08/04/20 0304 08/05/20 0742 07/29/2020 0323 08/07/20 0454 08/08/20 1400  NA 136 138 137 139 138  K 4.6 4.6 4.8 4.9 4.9  CL 96* 98 99 103 101  CO2 32 32 29 28 33*  GLUCOSE 77 232* 237* 214* 209*  BUN 33* 39* 36* 37* 35*  CREATININE 2.07* 1.87* 1.62* 1.77* 1.62*  CALCIUM 8.5* 8.7* 8.4* 8.2* 7.8*  MG  --   --   --   --  1.8    Studies: No results found.  Scheduled Meds: . atorvastatin  20 mg Oral Daily  . carvedilol  12.5 mg Oral BID WC  . Chlorhexidine Gluconate Cloth  6 each Topical Daily  . donepezil  10 mg Oral QHS  . enoxaparin (LOVENOX) injection  40 mg Subcutaneous Daily  . feeding supplement (PROSource TF)  45 mL Per Tube Daily  . free water  150 mL Per Tube Q4H  . insulin aspart  0-9 Units Subcutaneous Q4H  . nutrition supplement (JUVEN)  1 packet Per Tube BID BM  . OLANZapine  5 mg Oral  QHS  . pantoprazole  40 mg Oral Daily  . polyethylene glycol  17 g Oral Daily  . sodium chloride flush  10-40 mL Intracatheter Q12H  . vitamin B-12  1,000 mcg Oral Daily   Continuous Infusions: . feeding supplement (OSMOLITE 1.5 CAL) 1,000 mL (08/09/20 1600)  . vancomycin 750 mg (08/09/20 1635)   PRN Meds: acetaminophen **OR** acetaminophen, albuterol, haloperidol **OR** haloperidol lactate, sodium chloride flush  Time spent: 35 minutes  Author: Berle Mull, MD [facility] 08/09/2020 7:00 PM  To reach On-call, see care teams to locate the attending and reach out via [email or web address]. Between 7PM-7AM, please contact night-coverage If you still have difficulty reaching the attending provider, please page the [facility] (Director on Call) for [facility] on amion for assistance.

## 2020-08-09 NOTE — Procedures (Addendum)
Cortrak  Person Inserting Tube:  Kanika Bungert, RD Tube Type:  Cortrak - 43 inches Tube Location:  Left nare Initial Placement:  Stomach Secured by: Bridle Technique Used to Measure Tube Placement:  Documented cm marking at nare/ corner of mouth Cortrak Secured At:  67 cm   No x-ray is required. RN may begin using tube.   If the tube becomes dislodged please keep the tube and contact the Cortrak team at [email or web address] (password [email or web address]) for replacement.  If after hours and replacement cannot be delayed, place a NG tube and confirm placement with an abdominal x-ray.    Smaran Gaus RD, LDN Clinical Nutrition Pager listed in AMION    

## 2020-08-09 NOTE — Progress Notes (Signed)
Hypoglycemic event:   Patient CBG result is 69 mg/dL. Pt. has poor oral  intake. Confused and combative. On call night  provider MD Opyd made aware and orders were given. See new orders

## 2020-08-09 NOTE — Evaluation (Signed)
Physical Therapy Evaluation Patient Details Name: Ivan Parks MRN: [id number] DOB: 10/26/1940 Today's Date: 08/09/2020   History of Present Illness  Ivan Parks is a 79 y/o male who reported to ED with complaints of R groin pain and poor appeptite. Pt reported to [facility] in Feb for similar complaints but there were no significant findings. Pt was admitted on 07/18/2020 with abdominal pain. PMH includes dementia, HF, CKD, DM, LE edema, and COPD on 2L O2 at home.    Clinical Impression  Pt received in bed, impulsive but stating that he wants to get out of bed. Generally max Ax2 for all mobility. Attempted to stand with max Ax2 and hand-held assist but unable to clear hips off bed. Needed verbal and tactile cueing to stay on task. Demonstrated decreased problem solving, sequencing, safety awareness and judgement, wanting to stand without footwear/socks and unaware of lines. Followed simple commands inconsistently. Pt left in bed with all needs met, call bell within reach, bed alarm active, and dietician present. Pt would benefit from PT to increase independency and decrease risk for fall. Will continue to follow acutely.    Follow Up Recommendations Home health PT;Supervision for mobility/OOB (Home health if family can provide adequate support/supervision. If not, will have to consider SNF)    Equipment Recommendations  3in1 (PT)    Recommendations for Other Services       Precautions / Restrictions Precautions Precautions: Fall Precaution Comments: Reports of swinging at therapist. Restrictions Weight Bearing Restrictions: No      Mobility  Bed Mobility Overal bed mobility: Needs Assistance Bed Mobility: Supine to Sit;Sit to Supine     Supine to sit: Max assist;+2 for physical assistance Sit to supine: Max assist;+2 for physical assistance        Transfers Overall transfer level: Needs assistance Equipment used: 2 person hand held assist Transfers: Sit to/from Stand Sit to  Stand: Max assist;+2 physical assistance         General transfer comment: attempted but unsuccessful and unable to stand  Ambulation/Gait             General Gait Details: unable  Stairs            Wheelchair Mobility    Modified Rankin (Stroke Patients Only)       Balance Overall balance assessment: Needs assistance Sitting-balance support: Feet supported;Bilateral upper extremity supported Sitting balance-Leahy Scale: Poor Sitting balance - Comments: Required at least one UE Postural control: Posterior lean   Standing balance-Leahy Scale: Poor Standing balance comment: Reliant on external assistance                             Pertinent Vitals/Pain Pain Assessment: No/denies pain    Home Living                        Prior Function                 Hand Dominance        Extremity/Trunk Assessment                Communication      Cognition Arousal/Alertness: Awake/alert Behavior During Therapy: Restless;Flat affect Overall Cognitive Status: History of cognitive impairments - at baseline Area of Impairment: Attention;Memory;Safety/judgement;Following commands;Awareness;Problem solving                   Current Attention Level: Sustained Memory: Decreased recall of precautions;Decreased   short-term memory Following Commands: Follows one step commands inconsistently Safety/Judgement: Decreased awareness of safety Awareness: Emergent Problem Solving: Slow processing;Decreased initiation;Difficulty sequencing;Requires verbal cues;Requires tactile cues General Comments: No family in room.      General Comments      Exercises     Assessment/Plan    PT Assessment    PT Problem List         PT Treatment Interventions      PT Goals (Current goals can be found in the Care Plan section)  Acute Rehab PT Goals Patient Stated Goal: walk    Frequency Min 2X/week   Barriers to discharge         Co-evaluation               AM-PAC PT "6 Clicks" Mobility  Outcome Measure Help needed turning from your back to your side while in a flat bed without using bedrails?: A Lot Help needed moving from lying on your back to sitting on the side of a flat bed without using bedrails?: A Lot Help needed moving to and from a bed to a chair (including a wheelchair)?: Total Help needed standing up from a chair using your arms (e.g., wheelchair or bedside chair)?: A Lot Help needed to walk in hospital room?: Total Help needed climbing 3-5 steps with a railing? : Total 6 Click Score: 9    End of Session Equipment Utilized During Treatment: Gait belt Activity Tolerance: Patient tolerated treatment well Patient left: in bed;with call bell/phone within reach;with bed alarm set;Other (comment) (dietician present) Nurse Communication: Mobility status PT Visit Diagnosis: Unsteadiness on feet (R26.81);Muscle weakness (generalized) (M62.81)    Time:  -      Charges:          , SPT   

## 2020-08-09 NOTE — Progress Notes (Signed)
PT Cancellation Note  Patient Details Name: TRAVION KE MRN: [id number] DOB: [birth year]   Cancelled Treatment:    Reason Eval/Treat Not Completed: Other (comment) appears busy speaking with another hospital provider- will attempt to return as time/schedule allow.    Madelaine Etienne, DPT, PN1   Supplemental Physical Therapist [facility]    Pager [phone] Acute Rehab Office [phone]

## 2020-08-10 ENCOUNTER — Inpatient Hospital Stay (HOSPITAL_COMMUNITY): Payer: HMO

## 2020-08-10 DIAGNOSIS — L89329 Pressure ulcer of left buttock, unspecified stage: Secondary | ICD-10-CM | POA: Diagnosis not present

## 2020-08-10 DIAGNOSIS — E1165 Type 2 diabetes mellitus with hyperglycemia: Secondary | ICD-10-CM | POA: Diagnosis not present

## 2020-08-10 DIAGNOSIS — B9562 Methicillin resistant Staphylococcus aureus infection as the cause of diseases classified elsewhere: Secondary | ICD-10-CM | POA: Diagnosis not present

## 2020-08-10 DIAGNOSIS — R7881 Bacteremia: Secondary | ICD-10-CM | POA: Diagnosis not present

## 2020-08-10 LAB — BASIC METABOLIC PANEL
Anion gap: 4 — ABNORMAL LOW (ref 5–15)
BUN: 51 mg/dL — ABNORMAL HIGH (ref 8–23)
CO2: 31 mmol/L (ref 22–32)
Calcium: 8.1 mg/dL — ABNORMAL LOW (ref 8.9–10.3)
Chloride: 100 mmol/L (ref 98–111)
Creatinine, Ser: 2.01 mg/dL — ABNORMAL HIGH (ref 0.61–1.24)
GFR, Estimated: 33 mL/min — ABNORMAL LOW (ref >60)
Glucose, Bld: 253 mg/dL — ABNORMAL HIGH (ref 70–99)
Potassium: 5.1 mmol/L (ref 3.5–5.1)
Sodium: 135 mmol/L (ref 135–145)

## 2020-08-10 LAB — MAGNESIUM: Magnesium: 1.9 mg/dL (ref 1.7–2.4)

## 2020-08-10 LAB — GLUCOSE, CAPILLARY
Glucose-Capillary: 216 mg/dL — ABNORMAL HIGH (ref 70–99)
Glucose-Capillary: 173 mg/dL — ABNORMAL HIGH (ref 70–99)
Glucose-Capillary: 280 mg/dL — ABNORMAL HIGH (ref 70–99)
Glucose-Capillary: 287 mg/dL — ABNORMAL HIGH (ref 70–99)
Glucose-Capillary: 269 mg/dL — ABNORMAL HIGH (ref 70–99)

## 2020-08-10 LAB — PHOSPHORUS: Phosphorus: 3.2 mg/dL (ref 2.5–4.6)

## 2020-08-10 MED ORDER — DEXTROSE IN LACTATED RINGERS 5 % IV SOLN: INTRAVENOUS | Status: DC

## 2020-08-10 MED ORDER — ENSURE ENLIVE PO LIQD
237.0000 mL | Freq: Two times a day (BID) | ORAL | Status: DC
  Administered 2020-08-10 – 2020-08-11 (×2): 237 mL via ORAL

## 2020-08-10 MED ORDER — JUVEN PO PACK
1.0000 | PACK | Freq: Two times a day (BID) | ORAL | Status: DC
  Administered 2020-08-10 – 2020-08-11 (×2): 1 via ORAL
  Filled 2020-08-10 (×2): qty 1

## 2020-08-10 MED ORDER — ENOXAPARIN SODIUM 30 MG/0.3ML ~~LOC~~ SOLN
30.0000 mg | Freq: Every day | SUBCUTANEOUS | Status: DC
  Administered 2020-08-10: 30 mg via SUBCUTANEOUS
  Filled 2020-08-10: qty 0.3

## 2020-08-10 MED ORDER — POLYETHYLENE GLYCOL 3350 17 G PO PACK: 17.0000 g | PACK | Freq: Every day | ORAL | Status: DC | PRN

## 2020-08-10 MED ORDER — LACTATED RINGERS IV SOLN: INTRAVENOUS | Status: DC

## 2020-08-10 NOTE — Plan of Care (Signed)
  Problem: Education: Goal: Knowledge of General Education information will improve Description: Including pain rating scale, medication(s)/side effects and non-pharmacologic comfort measures Outcome: Progressing   Problem: Health Behavior/Discharge Planning: Goal: Ability to manage health-related needs will improve Outcome: Progressing   Problem: Clinical Measurements: Goal: Ability to maintain clinical measurements within normal limits will improve Outcome: Progressing Goal: Will remain free from infection Outcome: Progressing Goal: Diagnostic test results will improve Outcome: Progressing Goal: Cardiovascular complication will be avoided Outcome: Progressing   Problem: Nutrition: Goal: Adequate nutrition will be maintained Outcome: Progressing   Problem: Coping: Goal: Level of anxiety will decrease Outcome: Progressing   Problem: Elimination: Goal: Will not experience complications related to bowel motility Outcome: Progressing   Problem: Pain Managment: Goal: General experience of comfort will improve Outcome: Progressing   Problem: Safety: Goal: Ability to remain free from injury will improve Outcome: Progressing   Problem: Skin Integrity: Goal: Risk for impaired skin integrity will decrease Outcome: Progressing   Problem: Safety: Goal: Non-violent Restraint(s) Outcome: Progressing

## 2020-08-10 NOTE — Progress Notes (Addendum)
[facility] Progress Note  Patient: Ivan Parks    PJK:[id number]  DOA: 07/20/2020     Date of Service: the patient was seen and examined on 08/10/2020  Brief hospital course: Past medical history of type II DM, chronic HFpEF, CKD 3B, COPD, chronic respiratory failure on 2 LPM, cognitive deficit, noncompliance, right inguinal hernia. Presents with right groin pain found to have left gluteal cellulitis with MRSA bacteremia. ID was consulted. Currently plan is monitor for further improvement in nutrition.  Continue to engage with the family regarding goals of care  Assessment and Plan: 1. Left gluteal cellulitis, MRSA bacteremia: Sepsis POA Met SIRS criteria on admission with tachycardia tachypnea and leukocytosis along with fever. No fluctuance on palpation.  No debridement recommended by General surgery.  Blood cultures positive for MRSA ID was consulted. Initially patient was on daptomycin. Now changed to vancomycin TTE and TEE negative for any vegetation. Repeat cultures so far negative on 3/18. Continue local wound care. Per ID recommend 4 weeks of antibiotics, and 12/30/2020. ID recommends PICC line over tunneled catheter as patient has history of poor compliance and may likely not come back for a catheter removal which will lead to further complication. Also given his poor understanding of his medical condition patient remains a poor candidate for long-term HD. Based on this information PICC line was inserted.  2. Cognitive impairment, dementia. Acute metabolic encephalopathy secondary to sepsis, currently resolved. Continue delirium precautions, treating pain, prn haldol if agitated/combative. PT/OT evaluations. Has assistance with wife, son, step son and daughter but has grown less independent of late.  Appreciate consult palliative care for assistance with goals of care discussions going forward. Continue aricept, addedzyprexa qHS  CT scan negative for any acute  abnormality. Serum creatinine stable.  LFTs stable.  Ammonia level mildly elevated.  Will initiate lactulose. B12 632.  CRP 15.6. TSH and free T4 stable as well.  3. AKI on stage IIIb CKD: Improved and now stable for last 48 hours. decreased po intake, Baseline SCr 1.3-1.5. 1.94 on admission.  Hold ACE and diuretics  4. Chronic HFpEF HTN LVEF preserved, though G2DD and dilated IVC on TTE 3/17.  Given a dose of lasix due to leg swelling, though Cr bumped so will not continue this at this time with limited po intake. family reports steady decline of late.  Blood pressure currently controlled.  Continue Coreg Holding diuretic, ACEi with AKI as above.   5. Hydrocele, right groin pain, right inguinal hernia:  Hernia is reducible, though may be contributing to some mild discomfort.  Supportive care Pt apparently has surgical follow up planned for hernia management.  6. Type 2 diabetes mellitus uncontrolled with hyperglycemia with renal complication and long-term insulin use On sliding scale insulin right now. Globin A1c trending down. Monitor.  7. Venous insufficiency:  No DVT on U/S Elevate legs.  8. COPD: Severe  baseline but no exacerbation.  - Continue supplemental oxygen   9.  Poor p.o. intake Severe protein calorie malnutrition Minimal oral intake in last 4 days. As of right now family would like to attempt temporary feeding tube.  cortrak inserted on 3/25. Initiate on tube feeding. Monitor for refeeding syndrome.  Appreciate dietary consultation.  10.  Left buttocks pressure ulcer stage II POA Continue foam dressing  Pressure Injury 07/31/20 Buttocks Left Stage 2 -  Partial thickness loss of dermis presenting as a shallow open injury with a red, pink wound bed without slough. red (Active)  07/31/20 0529  Location: Buttocks  Location Orientation: Left  Staging: Stage 2 -  Partial thickness loss of dermis presenting as a shallow open injury with a red, pink  wound bed without slough.  Wound Description (Comments): red  Present on Admission: Yes     Pressure Injury 07/31/20 Buttocks Left Unstageable - Full thickness tissue loss in which the base of the injury is covered by slough (yellow, tan, gray, green or brown) and/or eschar (tan, brown or black) in the wound bed. surrounding tissue hard red (Active)  07/31/20 0529  Location: Buttocks  Location Orientation: Left  Staging: Unstageable - Full thickness tissue loss in which the base of the injury is covered by slough (yellow, tan, gray, green or brown) and/or eschar (tan, brown or black) in the wound bed.  Wound Description (Comments): surrounding tissue hard red  Present on Admission: Yes   11.  Goals of care conversation. Extensive conversation with the family, patient's wife as well as daughter on the phone. Also extensive discussion with the patient regarding his goals of care. Patient has poor p.o. intake throughout his hospital stay and does not participate in care on a consistent basis. Appears to be short of breath as well as in pain. With the concern of worsening mentation currently not receiving any medication to assist with this condition. Due to poor p.o. intake currently receiving IV fluids and this will lead to worsening of the shortness of breath. Appears to be having third spacing with lower extremity edema as well. Poor prognosis in the setting of multiple comorbidities. Family wanted to pursue core track, patient pulled out in less than 24-hour.  Not a candidate for long-term feeding tube. Explained to wife in detail.  She tells me to do the best that you can do and when you cannot do anything, God will decide what to do. Daughter will take turns with family to be present in the hospital to feed the patient. Patient ate some apple sauce and drank some juice with niece on 3/25 and he ate some part of his lunch on 3/26.  We will continue to monitor for oral intake  improvement. Patient on the other hand, appearing to be more clear than other days if he is not able to eat or drink, he would rather focus on comfort with pain control and symptom control.  12.  Hyperglycemia/hypoglycemia. Due to patient's poor p.o. intake patient has been suffering from hypoglycemia and is on D5 LR. Now appears to be eating and therefore sugars are running high. We will discontinue her D5 continue LR.  Diet: Regular diet DVT Prophylaxis:   enoxaparin (LOVENOX) injection 30 mg Start: 08/10/20 1000    Advance goals of care discussion: DNR  Family Communication: no family was present at bedside, at the time of interview.  Discussed with wife and daughter on the phone.  Disposition:  Status is: Inpatient  Remains inpatient appropriate because:IV treatments appropriate due to intensity of illness or inability to take PO   Dispo: The patient is from: Home              Anticipated d/c is to: SNF              Patient currently is not medically stable to d/c.   Difficult to place patient Yes  Subjective: No nausea no vomiting.  No fever no chills.  Reports back pain and leg pain as well as groin pain.  Also appears visibly short of breath.  Physical Exam:  General: Appear in mild distress,  no Rash; Oral Mucosa Clear, moist. no Abnormal Neck Mass Or lumps, Conjunctiva normal  Cardiovascular: S1 and S2 Present, no Murmur, Respiratory: increased respiratory effort, Bilateral Air entry present and bilateral  Crackles, no wheezes Abdomen: Bowel Sound present, Soft and no tenderness Extremities: trace Pedal edema Neurology: alert and oriented to time, place, and person affect appropriate. no new focal deficit Gait not checked due to patient safety concerns   Vitals:   08/09/20 2118 08/10/20 0436 08/10/20 0920 08/10/20 1620  BP: 130/63 112/62  109/69  Pulse: (!) 110 (!) 59 65 (!) 52  Resp: _0 Temp: (!) 97.5 F (36.4 C) 97.9 F (36.6 C)  98 F (36.7 C)   TempSrc: Oral Oral    SpO2: (!) 66% 96%  93%  Weight:      Height:        Intake/Output Summary (Last 24 hours) at 08/10/2020 1937 Last data filed at 08/10/2020 1600 Gross per 24 hour  Intake 958.06 ml  Output 400 ml  Net 558.06 ml   Filed Weights   07/24/2020 1038 07/23/2020 2050 08/07/20 2045  Weight: 71.2 kg 71.2 kg 71.2 kg    Data Reviewed: I have personally reviewed and interpreted daily labs, tele strips, imaging. I reviewed all nursing notes, pharmacy notes, vitals, pertinent old records I have discussed plan of care as described above with RN and patient/family.  CBC: Recent Labs  Lab 08/04/20 0304 08/05/20 0742 07/28/2020 0323 08/07/20 0454 08/08/20 1400  WBC 18.4* 15.1* 15.8* 12.5* 14.1*  NEUTROABS  --   --   --   --  11.8*  HGB 12.3* 12.5* 11.4* 12.4* 11.6*  HCT 40.0 40.3 39.1 43.3 41.0  MCV 90.9 90.2 93.3 96.2 97.4  PLT 210 243 169 200 850   Basic Metabolic Panel: Recent Labs  Lab 08/05/20 0742 08/10/2020 0323 08/07/20 0454 08/08/20 1400 08/10/20 0330  NA 138 137 139 138 135  K 4.6 4.8 4.9 4.9 5.1  CL 98 99 103 101 100  CO2 32 29 28 33* 31  GLUCOSE 232* 237* 214* 209* 253*  BUN 39* 36* 37* 35* 51*  CREATININE 1.87* 1.62* 1.77* 1.62* 2.01*  CALCIUM 8.7* 8.4* 8.2* 7.8* 8.1*  MG  --   --   --  1.8 1.9  PHOS  --   --   --   --  3.2    Studies: DG CHEST PORT 1 VIEW  Result Date: 08/10/2020 CLINICAL DATA:  Respiratory failure, cellulitis and bacteremia. EXAM: PORTABLE CHEST 1 VIEW COMPARISON:  07/31/2020 FINDINGS: Stable heart size. Interval placement of nasal feeding tube extending below the diaphragm. Lungs demonstrate mild increase in atelectasis at the left base. No overt edema, airspace consolidation, pneumothorax or pleural fluid identified. IMPRESSION: Mild increase in left basilar atelectasis. Electronically Signed   By: Aletta Edouard M.D.   On: 08/10/2020 11:44    Scheduled Meds: . carvedilol  12.5 mg Oral BID WC  . Chlorhexidine Gluconate  Cloth  6 each Topical Daily  . donepezil  10 mg Oral QHS  . enoxaparin (LOVENOX) injection  30 mg Subcutaneous Daily  . feeding supplement  237 mL Oral BID BM  . insulin aspart  0-9 Units Subcutaneous Q4H  . nutrition supplement (JUVEN)  1 packet Oral BID BM  . OLANZapine  5 mg Oral QHS  . sodium chloride flush  10-40 mL Intracatheter Q12H  . vitamin B-12  1,000 mcg Oral Daily   Continuous Infusions: . lactated ringers 75  mL/hr at 08/10/20 1705  . vancomycin 750 mg (08/10/20 1710)   PRN Meds: acetaminophen **OR** acetaminophen, albuterol, haloperidol **OR** haloperidol lactate, polyethylene glycol, sodium chloride flush  Time spent: 35 minutes  Author: Berle Mull, MD [facility] 08/10/2020 7:37 PM  To reach On-call, see care teams to locate the attending and reach out via [email or web address]. Between 7PM-7AM, please contact night-coverage If you still have difficulty reaching the attending provider, please page the [facility] (Director on Call) for [facility] on amion for assistance.

## 2020-08-10 NOTE — Plan of Care (Signed)
  Problem: Education: Goal: Knowledge of General Education information will improve Description Including pain rating scale, medication(s)/side effects and non-pharmacologic comfort measures Outcome: Progressing   Problem: Nutrition: Goal: Adequate nutrition will be maintained Outcome: Progressing   Problem: Elimination: Goal: Will not experience complications related to bowel motility Outcome: Progressing   Problem: Skin Integrity: Goal: Risk for impaired skin integrity will decrease Outcome: Progressing   

## 2020-08-10 NOTE — Progress Notes (Signed)
At beginning of shift last night, patient was found out of bed and sitting on the chair.  He was yelling for "Help".  He had defecated on the bed and on the floor.  His PICC line was wrapped around his arm.  He was cleaned.  When it was time to go back to the bed, he stated he was unable to stand.  It took maximum assist with two team members to assist him back to bed.  At approximately 0430, he pulled his Cortrak out.  Will continue to monitor patient.  Bernie Covey RN

## 2020-08-11 ENCOUNTER — Inpatient Hospital Stay (HOSPITAL_COMMUNITY): Payer: HMO

## 2020-08-11 DIAGNOSIS — R7881 Bacteremia: Secondary | ICD-10-CM | POA: Diagnosis not present

## 2020-08-11 DIAGNOSIS — L89329 Pressure ulcer of left buttock, unspecified stage: Secondary | ICD-10-CM | POA: Diagnosis not present

## 2020-08-11 DIAGNOSIS — B9562 Methicillin resistant Staphylococcus aureus infection as the cause of diseases classified elsewhere: Secondary | ICD-10-CM | POA: Diagnosis not present

## 2020-08-11 DIAGNOSIS — E1165 Type 2 diabetes mellitus with hyperglycemia: Secondary | ICD-10-CM | POA: Diagnosis not present

## 2020-08-11 LAB — CBC
HCT: 42.4 % (ref 39.0–52.0)
Hemoglobin: 11.9 g/dL — ABNORMAL LOW (ref 13.0–17.0)
MCH: 27.7 pg (ref 26.0–34.0)
MCHC: 28.1 g/dL — ABNORMAL LOW (ref 30.0–36.0)
MCV: 98.6 fL (ref 80.0–100.0)
Platelets: 233 10*3/uL (ref 150–400)
RBC: 4.3 MIL/uL (ref 4.22–5.81)
RDW: 12.8 % (ref 11.5–15.5)
WBC: 19.6 10*3/uL — ABNORMAL HIGH (ref 4.0–10.5)
nRBC: 0.1 % (ref 0.0–0.2)

## 2020-08-11 LAB — BASIC METABOLIC PANEL
Anion gap: 3 — ABNORMAL LOW (ref 5–15)
BUN: 60 mg/dL — ABNORMAL HIGH (ref 8–23)
CO2: 31 mmol/L (ref 22–32)
Calcium: 8.2 mg/dL — ABNORMAL LOW (ref 8.9–10.3)
Chloride: 102 mmol/L (ref 98–111)
Creatinine, Ser: 2.65 mg/dL — ABNORMAL HIGH (ref 0.61–1.24)
GFR, Estimated: 24 mL/min — ABNORMAL LOW (ref >60)
Glucose, Bld: 182 mg/dL — ABNORMAL HIGH (ref 70–99)
Potassium: 5.1 mmol/L (ref 3.5–5.1)
Sodium: 136 mmol/L (ref 135–145)

## 2020-08-11 LAB — MAGNESIUM: Magnesium: 2 mg/dL (ref 1.7–2.4)

## 2020-08-11 LAB — GLUCOSE, CAPILLARY
Glucose-Capillary: 141 mg/dL — ABNORMAL HIGH (ref 70–99)
Glucose-Capillary: 170 mg/dL — ABNORMAL HIGH (ref 70–99)
Glucose-Capillary: 176 mg/dL — ABNORMAL HIGH (ref 70–99)
Glucose-Capillary: 211 mg/dL — ABNORMAL HIGH (ref 70–99)

## 2020-08-11 MED ORDER — LORAZEPAM 2 MG/ML PO CONC: 1.0000 mg | ORAL | Status: DC | PRN

## 2020-08-11 MED ORDER — GLYCOPYRROLATE 0.2 MG/ML IJ SOLN: 0.2000 mg | INTRAMUSCULAR | Status: DC | PRN

## 2020-08-11 MED ORDER — HEPARIN SODIUM (PORCINE) 5000 UNIT/ML IJ SOLN: 5000.0000 [IU] | Freq: Three times a day (TID) | INTRAMUSCULAR | Status: DC

## 2020-08-11 MED ORDER — LORAZEPAM 1 MG PO TABS: 1.0000 mg | ORAL_TABLET | ORAL | Status: DC | PRN

## 2020-08-11 MED ORDER — OXYCODONE HCL 20 MG/ML PO CONC: 5.0000 mg | ORAL | Status: DC | PRN

## 2020-08-11 MED ORDER — VANCOMYCIN VARIABLE DOSE PER UNSTABLE RENAL FUNCTION (PHARMACIST DOSING): Status: DC

## 2020-08-11 MED ORDER — HYDROMORPHONE HCL 1 MG/ML IJ SOLN
0.5000 mg | INTRAMUSCULAR | Status: DC | PRN
  Administered 2020-08-11: 0.5 mg via INTRAVENOUS
  Filled 2020-08-11: qty 1

## 2020-08-11 MED ORDER — LORAZEPAM 2 MG/ML IJ SOLN
1.0000 mg | INTRAMUSCULAR | Status: DC | PRN
  Filled 2020-08-11: qty 1

## 2020-08-11 MED ORDER — DIPHENHYDRAMINE HCL 50 MG/ML IJ SOLN: 12.5000 mg | INTRAMUSCULAR | Status: DC | PRN

## 2020-08-11 MED ORDER — ONDANSETRON HCL 4 MG/2ML IJ SOLN: 4.0000 mg | Freq: Four times a day (QID) | INTRAMUSCULAR | Status: DC | PRN

## 2020-08-11 MED ORDER — SODIUM CHLORIDE 0.9 % IV SOLN: INTRAVENOUS | Status: DC

## 2020-08-11 MED ORDER — ONDANSETRON 4 MG PO TBDP: 4.0000 mg | ORAL_TABLET | Freq: Four times a day (QID) | ORAL | Status: DC | PRN

## 2020-08-11 MED ORDER — SODIUM CHLORIDE 0.9 % IV BOLUS
1000.0000 mL | Freq: Once | INTRAVENOUS | Status: AC
  Administered 2020-08-11: 1000 mL via INTRAVENOUS

## 2020-08-11 MED ORDER — NITROGLYCERIN 0.4 MG SL SUBL: 0.4000 mg | SUBLINGUAL_TABLET | SUBLINGUAL | Status: DC | PRN

## 2020-08-11 MED ORDER — GLYCOPYRROLATE 1 MG PO TABS
1.0000 mg | ORAL_TABLET | ORAL | Status: DC | PRN
  Filled 2020-08-11: qty 1

## 2020-08-11 MED ORDER — MAGIC MOUTHWASH
15.0000 mL | Freq: Four times a day (QID) | ORAL | Status: DC | PRN
  Filled 2020-08-11: qty 15

## 2020-08-11 MED ORDER — ACETAMINOPHEN 325 MG PO TABS: 650.0000 mg | ORAL_TABLET | Freq: Four times a day (QID) | ORAL | Status: DC | PRN

## 2020-08-11 MED ORDER — ACETAMINOPHEN 650 MG RE SUPP: 650.0000 mg | Freq: Four times a day (QID) | RECTAL | Status: DC | PRN

## 2020-08-11 MED ORDER — LORAZEPAM 2 MG/ML IJ SOLN
1.0000 mg | INTRAMUSCULAR | Status: DC | PRN
  Administered 2020-08-12: 1 mg via INTRAVENOUS

## 2020-08-11 NOTE — Progress Notes (Signed)
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 Presents with right groin pain found to have left gluteal cellulitis with MRSA bacteremia. ID was consulted. Currently plan is comfort care given worsening renal dysfunction, worsening mentation, worsening respiratory status as well as ongoing complaint of pain with poor p.o. intake  Assessment and Plan: 1. Left gluteal cellulitis, MRSA bacteremia: Sepsis POA Met SIRS criteria on admission with tachycardia tachypnea and leukocytosis along with fever. No fluctuance on palpation.  No debridement recommended by General surgery.  Blood cultures positive for MRSA ID was consulted. Initially patient was on daptomycin. Now changed to vancomycin TTE and TEE negative for any vegetation. Repeat cultures so far negative on 3/18. Continue local wound care. Per ID recommend 4 weeks of antibiotics, and 12/30/2020. ID recommends PICC line over tunneled catheter as patient has history of poor compliance and may likely not come back for a catheter removal which will lead to further complication. Also given his poor understanding of his medical condition patient remains a poor candidate for long-term HD. Based on this information PICC line was inserted. Currently comfort care  2. Cognitive impairment, dementia. Acute metabolic encephalopathy secondary to sepsis, currently resolved. Continue delirium precautions, treating pain, prn haldol if agitated/combative. PT/OT evaluations. Has assistance with wife, son, step son and daughter but has grown less independent of late.  Appreciate consult palliative care for assistance with goals of care discussions going  forward. Continue aricept, addedzyprexa qHS  CT scan negative for any acute abnormality. Serum creatinine stable.  LFTs stable.  Ammonia level mildly elevated.  Will initiate lactulose. B12 632.  CRP 15.6. TSH and free T4 stable as well. Mentation worsened again on 3/27 requiring further evaluation with CT scan which was unremarkable, CBG was unremarkable.  EEG was ordered.  But before this can be done discussion with the family was performed and patient was transitioned to comfort care.  3. AKI on stage IIIb CKD: Continue to have decreased po intake, Baseline SCr 1.3-1.5. 1.94 on admission.  Hold ACE and diuretics Renal function worsening this is despite patient receiving aggressive IV hydration as well as oral intake. This likely reflecting in patient's mentation. Not a candidate for further hemodialysis or treatment. Continue IV fluid will only worsen patient's respiratory distress. Discussed with the patient's and family transition to comfort care.  4.   Acute on chronic HFpEF HTN LVEF preserved, though G2DD and dilated IVC on TTE 3/17.  Given a dose of lasix due to leg swelling, though Cr bumped so will not continue this at this time with limited po intake. family reports steady decline of late.  Blood pressure currently controlled.  Continue Coreg Holding diuretic, ACEi with AKI as above. Appears to be having worsening respiratory stress now on 4 L of oxygen. Transition to comfort care.  5. Hydrocele, right groin pain, right inguinal hernia:  Hernia is reducible, though may be contributing to some mild discomfort.  Supportive care Transition to comfort care.  6. Type 2 diabetes mellitus uncontrolled with hyperglycemia with renal complication and long-term insulin use Patient was on sliding scale insulin. Due to poor p.o. intake patient was actually on dextrose fluid. Currently transition to comfort care.  7. Venous insufficiency:  No DVT on U/S Elevate legs.  8.  COPD: Severe  baseline but no exacerbation.  - Continue supplemental oxygen , continue comfort measures.  9.  Poor p.o. intake Severe protein calorie malnutrition Minimal oral intake throughout the hospital stay As of right now family would like to attempt temporary feeding tube.  cortrak inserted on 3/25. And tube feedings were started although patient removed this within 24 hours. Now transition to comfort care.  10.  Left buttocks pressure ulcer stage II POA Continue foam dressing  Pressure Injury 07/31/20 Buttocks Left Stage 2 -  Partial thickness loss of dermis presenting as a shallow open injury with a red, pink wound bed without slough. red (Active)  07/31/20 0529  Location: Buttocks  Location Orientation: Left  Staging: Stage 2 -  Partial thickness loss of dermis presenting as a shallow open injury with a red, pink wound bed without slough.  Wound Description (Comments): red  Present on Admission: Yes     Pressure Injury 07/31/20 Buttocks Left Unstageable - Full thickness tissue loss in which the base of the injury is covered by slough (yellow, tan, gray, green or brown) and/or eschar (tan, brown or black) in the wound bed. surrounding tissue hard red (Active)  07/31/20 0529  Location: Buttocks  Location Orientation: Left  Staging: Unstageable - Full thickness tissue loss in which the base of the injury is covered by slough (yellow, tan, gray, green or brown) and/or eschar (tan, brown or black) in the wound bed.  Wound Description (Comments): surrounding tissue hard red  Present on Admission: Yes   11.  Goals of care conversation. Extensive conversation with the family, patient's wife as well as daughter on the phone. Also extensive discussion with the patient regarding his goals of care. Patient has poor p.o. intake throughout his hospital stay and does not participate in care on a consistent basis. Appears to be short of breath as well as in pain. With the concern of  worsening mentation currently not receiving any medication to assist with this condition. Due to poor p.o. intake currently receiving IV fluids and this will lead to worsening of the shortness of breath. Appears to be having third spacing with lower extremity edema as well. Poor prognosis in the setting of multiple comorbidities. Family wanted to pursue core track, patient pulled out in less than 24-hour.  Not a candidate for long-term feeding tube. Explained to wife in detail.  She tells me to do the best that you can do and when you cannot do anything, God will decide what to do. Daughter will take turns with family to be present in the hospital to feed the patient. Patient ate some apple sauce and drank some juice with niece on 3/25 and he ate some part of his lunch on 3/26.  We will continue to monitor for oral intake improvement. Patient on the other hand, appearing to be more clear than other days if he is not able to eat or drink, he would rather focus on comfort with pain control and symptom control.  3/27. Extensive discussion with patient's 2 daughters and wife on the phone. Patient with worsening mentation worsening renal function worsening respiratory distress and ongoing chest pain. Does not appear to be progressing well with his oral intake and appears to have worsening side effect of IV hydration. Unable to tolerate core track. due to worsening mentation CT scan was performed which was also unremarkable but mentation did not improve. Patient has also intermittent episodes of bradycardia  requiring stopping of carvedilol. At present based on this discussion recommended family transition to comfort care.  Family agrees.  Likely will require residential hospice.  12.  Hyperglycemia/hypoglycemia. Now comfort care.  Diet: Regular diet DVT Prophylaxis:   Advance goals of care discussion: DNR  Family Communication: no family was present at bedside, at the time of interview.   Discussed with wife and daughter on the phone.  Disposition:  Status is: Inpatient  Remains inpatient appropriate because:IV treatments appropriate due to intensity of illness or inability to take PO   Dispo: The patient is from: Home              Anticipated d/c is to: SNF              Patient currently is not medically stable to d/c.   Difficult to place patient Yes  Subjective: Confused.  Unable to follow commands.  Minimally responsive.  Physical Exam:  General: Appear in mild distress, no Rash; Oral Mucosa Clear, moist. no Abnormal Neck Mass Or lumps, Conjunctiva normal  Cardiovascular: S1 and S2 Present, no Murmur, Respiratory: increased respiratory effort, Bilateral Air entry present and bilateral  Crackles, no wheezes Abdomen: Bowel Sound present, Soft and no tenderness Extremities: trace Pedal edema Neurology: Lethargic, not oriented, incoherent speech. Bilateral upper extremity strength intact. Gait not checked due to patient safety concerns   Vitals:   08/10/20 1620 08/10/20 2047 08/11/20 0507 08/11/20 0929  BP: 109/69 92/80 (!) 112/56 130/71  Pulse: (!) 52 (!) 58 (!) 54 (!) 51  Resp: _0 Temp: 98 F (36.7 C) 97.8 F (36.6 C) 97.6 F (36.4 C) 98.2 F (36.8 C)  TempSrc:  Axillary Axillary Oral  SpO2: 93% 97% 93% 92%  Weight:   71 kg   Height:        Intake/Output Summary (Last 24 hours) at 08/11/2020 1825 Last data filed at 08/11/2020 0900 Gross per 24 hour  Intake 562.56 ml  Output -  Net 562.56 ml   Filed Weights   07/16/2020 2050 08/07/20 2045 08/11/20 0507  Weight: 71.2 kg 71.2 kg 71 kg    Data Reviewed: I have personally reviewed and interpreted daily labs, tele strips, imaging. I reviewed all nursing notes, pharmacy notes, vitals, pertinent old records I have discussed plan of care as described above with RN and patient/family.  CBC: Recent Labs  Lab 08/05/20 0742 07/22/2020 0323 08/07/20 0454 08/08/20 1400 08/11/20 0318  WBC 15.1*  15.8* 12.5* 14.1* 19.6*  NEUTROABS  --   --   --  11.8*  --   HGB 12.5* 11.4* 12.4* 11.6* 11.9*  HCT 40.3 39.1 43.3 41.0 42.4  MCV 90.2 93.3 96.2 97.4 98.6  PLT 243 169 200 198 767   Basic Metabolic Panel: Recent Labs  Lab 08/03/2020 0323 08/07/20 0454 08/08/20 1400 08/10/20 0330 08/11/20 0318  NA 137 139 138 135 136  K 4.8 4.9 4.9 5.1 5.1  CL 99 103 101 100 102  CO2 29 28 33* 31 31  GLUCOSE 237* 214* 209* 253* 182*  BUN 36* 37* 35* 51* 60*  CREATININE 1.62* 1.77* 1.62* 2.01* 2.65*  CALCIUM 8.4* 8.2* 7.8* 8.1* 8.2*  MG  --   --  1.8 1.9 2.0  PHOS  --   --   --  3.2  --     Studies: CT HEAD WO CONTRAST  Result Date: 08/11/2020 CLINICAL DATA:  80 year old male with altered mental status. EXAM: CT HEAD WITHOUT CONTRAST TECHNIQUE:  Contiguous axial images were obtained from the base of the skull through the vertex without intravenous contrast. COMPARISON:  08/08/2020 CT and prior studies FINDINGS: Brain: No evidence of acute infarction, hemorrhage, hydrocephalus, extra-axial collection or mass lesion/mass effect. Atrophy and chronic small-vessel white matter ischemic changes are again noted. Vascular: Carotid atherosclerotic calcifications are noted. Skull: Normal. Negative for fracture or focal lesion. Sinuses/Orbits: No acute finding. Other: None IMPRESSION: 1. No evidence of acute intracranial abnormality. 2. Atrophy and chronic small-vessel white matter ischemic changes. Electronically Signed   By: Margarette Canada M.D.   On: 08/11/2020 11:47    Scheduled Meds: . Chlorhexidine Gluconate Cloth  6 each Topical Daily  . nutrition supplement (JUVEN)  1 packet Oral BID BM  . OLANZapine  5 mg Oral QHS  . sodium chloride flush  10-40 mL Intracatheter Q12H   Continuous Infusions:  PRN Meds: acetaminophen **OR** acetaminophen, albuterol, diphenhydrAMINE, glycopyrrolate **OR** glycopyrrolate **OR** glycopyrrolate, haloperidol **OR** haloperidol lactate, HYDROmorphone (DILAUDID) injection,  LORazepam **OR** LORazepam **OR** LORazepam, LORazepam, magic mouthwash, nitroGLYCERIN, ondansetron **OR** ondansetron (ZOFRAN) IV, oxyCODONE **OR** oxyCODONE, polyethylene glycol, sodium chloride flush  Time spent: 35 minutes  Author: Berle Mull, MD [facility] 08/11/2020 6:25 PM  To reach On-call, see care teams to locate the attending and reach out via [email or web address]. Between 7PM-7AM, please contact night-coverage If you still have difficulty reaching the attending provider, please page the [facility] (Director on Call) for [facility] on amion for assistance.

## 2020-08-11 NOTE — Plan of Care (Signed)
  Problem: Education: Goal: Knowledge of General Education information will improve Description: Including pain rating scale, medication(s)/side effects and non-pharmacologic comfort measures Outcome: Progressing   Problem: Pain Managment: Goal: General experience of comfort will improve Outcome: Progressing   Problem: Safety: Goal: Ability to remain free from injury will improve Outcome: Progressing   

## 2020-08-11 NOTE — Progress Notes (Signed)
  Pharmacy Antibiotic Note  Ivan Parks is a 80 y.o. male admitted on 07/24/2020 with MRSA bacteremia. Pharmacy has been consulted for vancomycin dosing.  SCr up to 2.6 from 1.6.  Holding vancomycin due to worsening renal function. Spoke with Dr. Allena Katz, ideally would like to switch to Linezolid since not renally adjusted but will need ID approval.   Plan: Hold vancomycin today Could get vancomycin random 3/28 if not switched to linzeolid  Monitor SCr and levels as appropriately ordered - OPAT orders are in place for discharge.   Height: 5\' 5"  (165.1 cm) Weight: 71 kg (156 lb 8.4 oz) IBW/kg (Calculated) : 61.5  Temp (24hrs), Avg:97.9 F (36.6 C), Min:97.6 F (36.4 C), Max:98.2 F (36.8 C)  Recent Labs  Lab 08/05/20 0742 09-02-20 0323 08/07/20 0454 08/08/20 1400 08/10/20 0330 08/11/20 0318  WBC 15.1* 15.8* 12.5* 14.1*  --  19.6*  CREATININE 1.87* 1.62* 1.77* 1.62* 2.01* 2.65*  VANCOTROUGH  --   --   --  16  --   --     Estimated Creatinine Clearance: 19.3 mL/min (A) (by C-G formula based on SCr of 2.65 mg/dL (H)).    No Known Allergies  Antimicrobials this admission: Daptomycin 3/17>>3/21 Vancomycin 3/21>>  Dose adjustments this admission: 3/24 VT: 16 mcg/ml >> continue vancomycin 750mg  IV q24hours 3/27: dose held d/t renal function   Microbiology results: 3/16 BC: MRSA 3/18 BC: ngtd  Thank you for allowing pharmacy to be a part of this patient's care.  4/16, PharmD PGY-1 Acute Care Pharmacy Resident Office: [phone] 08/11/2020 12:59 PM

## 2020-08-12 ENCOUNTER — Telehealth: Payer: Self-pay | Admitting: Orthopedic Surgery

## 2020-08-16 NOTE — Telephone Encounter (Signed)
Sympathy card mailed to patient's family

## 2020-08-16 NOTE — Death Summary Note (Addendum)
[facility] Death Summary   Patient: Ivan Parks WJX:[id number]   PCP: Harlan Stains, MD DOB: [birth year]    Admit Date:  08-24-2020  Date of Death:   September 06, 2020   Time of Death:   09:43 AM  Length of Stay: [facility] Diagnoses:  Principle Cause of death Sepsis POA, Left gluteal cellulitis, MRSA bacteremia Principal Problem:   MRSA bacteremia Active Problems:   COPD, severe ([place])   Leg edema, right- Greater than Left leg   Essential hypertension   Acute kidney injury (nontraumatic) (HCC)   CHF (congestive heart failure) (HCC)   Abdominal pain   Controlled type 2 diabetes mellitus with hyperglycemia (HCC)   Pressure injury of skin   Protein-calorie malnutrition, severe  History of present illness: As per the H and P dictated on admission, "Ivan Parks is a 80 y.o. male with history of diabetes mellitus type 2, diastolic CHF, chronic venous insufficiency with chronic edema, chronic kidney disease stage III baseline creatinine around 1.3, COPD on home oxygen presents to the ER for the second time in the last 1 month with complaints of right groin pain.  But at this time patient also has been having poor appetite for the last 3 days hardly eating anything.  Denies vomiting or diarrhea.  Pain in the groin is mostly on the right side.  ED Course: In the ER labs showed leukocytosis with worsening renal function creatinine increased from 1.3 on June 28, 2020 it is around 1.9 with leukocytosis of 15.7.  On exam patient has mild tenderness the right groin area with CT renal study showing nothing acute and ultrasound of the scrotum showing nothing unremarkable except for the hydrocele.  Patient was given fluid bolus in the ER.  Covid test is still pending.  Admitted for worsening renal function with poor appetite with leukocytosis cause not clear."  Hospital Course:  1.Left gluteal cellulitis, MRSA bacteremia: Sepsis POA Met SIRS criteria on admission with tachycardia,  tachypnea and leukocytosis along with fever. Presented with right groin pain. No fluctuance on palpation.  No debridement recommended byGeneral surgery. Blood cultures positive for MRSA ID was consulted. Initially patient was on daptomycin, then changed to vancomycin TTE and TEE negative for any vegetation. Repeat cultures so far negative on 3/18. local wound care was provided.  Per ID recommend 4 weeks of antibiotics, end 08/30/2020. ID recommends PICC line over tunneled catheter as patient has history of poor compliance and may likely not come back for a catheter removal which will lead to further complication. Also given his poor understanding of his medical condition patient remains a poor candidate for long-term HD. Based on this information PICC line was inserted.  2.Cognitive impairment, dementia. Acute metabolic encephalopathy secondary to sepsis, intermittent.  Continue delirium precautions, treating pain, prn haldol if agitated/combative. Has assistance with wife, son, step son and daughter but has grown less independent of late.  Appreciate palliative care for assistance with goals of care discussions.  Was on aricept, addedzyprexa qHS Multiple CT scan were done due to intermittent changes in mentation. Negative for any acute abnormality. B12 632.  CRP 15.6. TSH and free T4 stable as well. Ammonia level was elevated but pt started having multiple BM, so it was not ordered.  Mentation worsened again on 3/27 requiring further evaluation with CT scan which was unremarkable, CBG was unremarkable.  EEG was ordered.  But before this can be done, discussion with the family was performed and patient was transitioned to comfort  care.  3.AKI on stage IIIb CKD: Continue to have decreased po intake, Baseline SCr 1.3-1.5. 1.94 on admission.  Held ACE and diuretics Renal function worsening despite patient receiving aggressive IV hydration as well as family attempting to increase  oral intake. Not a candidate for further hemodialysis or treatment. Continuation of IV fluid will only worsen patient's respiratory distress. Discussed with the patient's and family. Transition to comfort care.  4.  Acute on chronic HFpEF HTN LVEF preserved, though G2DD and dilated IVC on TTE 3/17.  Given a dose of lasix due to leg swelling, though Cr bumped so did not continue with limited po intake. family reports steady decline of late. Was on Coreg, held due to bradycardia episodes.  Also held diuretic, ACEi with AKI as above. Appears to be having worsening respiratory stress now on 4 L of oxygen. Transition to comfort care.  5.Hydrocele, right groin pain, right inguinal hernia: Hernia is reducible, though may be contributing to some mild discomfort.  Supportive care Transition to comfort care.  6.Type 2 diabetes mellitus uncontrolled with hyperglycemia and hypoglycemia with renal complication and long-term insulin use Patient was on sliding scale insulin. Due to poor p.o. intake patient was actually on dextrose fluid. Currently transition to comfort care.  7.Venous insufficiency: No DVT on U/S Elevate legs.  8.COPD: Severe baseline but no exacerbation.  Continue supplemental oxygen, continue comfort measures.  9.Poor p.o. intake Severe protein calorie malnutrition Minimal oral intake throughout the hospital stay As of right now family would like to attempt temporary feeding tube. Cortrak inserted on 3/25. Tube feedings were started although patient removed cortrak within 24 hours. Now transition to comfort care.  10.Left buttocks pressure ulcer stage II POA Continue foam dressing  11.  Goals of care conversation. 3/26  Extensive conversation with the family, patient's wife as well as daughter on the phone. Also extensive discussion with the patient regarding his goals of care. Patient has poor p.o. intake throughout his hospital stay and does  not participate in care on a consistent basis. Appears to be short of breath as well as in pain. With the concern of worsening mentation currently not receiving any medication to assist with this condition. Due to poor p.o. intake was receiving IV fluids and this will lead to worsening of the shortness of breath. Appears to be having third spacing with lower extremity edema as well. Poor prognosis in the setting of multiple comorbidities. Family wanted to pursue cortrak, patient pulled out in less than 24-hour.  Not a candidate for long-term feeding tube. Explained to wife in detail.  She tells me to do the best that you can do and when you cannot do anything, God will decide what to do. Daughter will take turns with family to be present in the hospital to feed the patient. Patient ate some apple sauce and drank some juice with niece on 3/25 and he ate some part of his lunch on 3/26. Patient on the other hand, appearing to be more clear than other days. If he is not able to eat or drink, he would rather focus on comfort with pain control and symptom control.  3/27 Extensive discussion with patient's 2 daughters and wife on the phone. Patient with worsening mentation, worsening renal function, worsening respiratory distress and ongoing chest pain. Does not appear to be progressing well with his oral intake and appears to have worsening side effect of IV hydration. Unable to tolerate cortrak. Due to worsening mentation, CT scan was performed which  was also unremarkable. Mentation did not improve. Patient has also intermittent episodes of bradycardia requiring stopping of carvedilol. At present based on this discussion recommended, family transition to comfort care.  Family agrees.    08/28/2022  Repeat discussion with family at bedside and explained to other family members, in last 24 hours needed one dose of dilaudid and one dose of ativan. Continue comfort care.   The patient was pronounced  deceased at 09:43AM, on 08-27-20. Family was at bedside. Support was provided.   Procedures and Results:  picc line placement   Echocardiogram   TEE  Cortrak  Consultations:  General surgery   ID   Palliative care   The results of significant diagnostics from this hospitalization (including imaging, microbiology, ancillary and laboratory) are listed below for reference.    Significant Diagnostic Studies: CT HEAD WO CONTRAST  Result Date: 08/11/2020 CLINICAL DATA:  80 year old male with altered mental status. EXAM: CT HEAD WITHOUT CONTRAST TECHNIQUE: Contiguous axial images were obtained from the base of the skull through the vertex without intravenous contrast. COMPARISON:  08/08/2020 CT and prior studies FINDINGS: Brain: No evidence of acute infarction, hemorrhage, hydrocephalus, extra-axial collection or mass lesion/mass effect. Atrophy and chronic small-vessel white matter ischemic changes are again noted. Vascular: Carotid atherosclerotic calcifications are noted. Skull: Normal. Negative for fracture or focal lesion. Sinuses/Orbits: No acute finding. Other: None IMPRESSION: 1. No evidence of acute intracranial abnormality. 2. Atrophy and chronic small-vessel white matter ischemic changes. Electronically Signed   By: Margarette Canada M.D.   On: 08/11/2020 11:47   CT HEAD WO CONTRAST  Result Date: 08/08/2020 CLINICAL DATA:  Mental status changes of uncertain etiology EXAM: CT HEAD WITHOUT CONTRAST TECHNIQUE: Contiguous axial images were obtained from the base of the skull through the vertex without intravenous contrast. Sagittal and coronal MPR images reconstructed from axial data set. Images were repeated due to patient motion. COMPARISON:  04/07/2019 FINDINGS: Brain: Generalized atrophy. Normal ventricular morphology. No midline shift or mass effect. Small vessel chronic ischemic changes of deep cerebral white matter. Scattered artifacts from beam hardening and motion. No definite  intracranial hemorrhage, mass lesion, or evidence of infarction. Vascular: No hyperdense vessels Skull: Intact Sinuses/Orbits: Clear Other: N/A IMPRESSION: Atrophy with small vessel chronic ischemic changes of deep cerebral white matter. No definite acute intracranial abnormalities. Electronically Signed   By: Lavonia Dana M.D.   On: 08/08/2020 17:01   MR PELVIS WO CONTRAST  Result Date: 07/31/2020 CLINICAL DATA:  Right groin pain. EXAM: MRI PELVIS WITHOUT CONTRAST TECHNIQUE: Multiplanar multisequence MR imaging of the pelvis was performed. No intravenous contrast was administered. COMPARISON:  CT abdomen pelvis from same day. FINDINGS: Limited study due to motion artifact. Urinary Tract:  No abnormality visualized. Bowel:  Unremarkable visualized pelvic bowel loops. Vascular/Lymphatic: Unchanged mildly enlarged left inguinal lymph node measuring 1.2 cm in short axis, likely reactive. No significant vascular findings. Reproductive: The prostate gland is unremarkable. Bilateral hydroceles again noted. Other:  Unchanged small fat containing right inguinal hernia. Musculoskeletal: No marrow signal abnormality. Small amount of fluid in the right iliopsoas bursa (series 7, image 29). Mild bilateral hip osteoarthritis. Full-thickness left anterior superior labral tear (series 4, image 70). IMPRESSION: 1. Limited study.  Mild right iliopsoas bursitis. 2. Mild bilateral hip osteoarthritis. 3. Full-thickness left anterior superior labral tear. 4. Unchanged small fat containing right inguinal hernia. Electronically Signed   By: Titus Dubin M.D.   On: 07/31/2020 10:06   DG CHEST PORT 1 VIEW  Result Date: 08/10/2020  CLINICAL DATA:  Respiratory failure, cellulitis and bacteremia. EXAM: PORTABLE CHEST 1 VIEW COMPARISON:  07/31/2020 FINDINGS: Stable heart size. Interval placement of nasal feeding tube extending below the diaphragm. Lungs demonstrate mild increase in atelectasis at the left base. No overt edema, airspace  consolidation, pneumothorax or pleural fluid identified. IMPRESSION: Mild increase in left basilar atelectasis. Electronically Signed   By: Aletta Edouard M.D.   On: 08/10/2020 11:44   DG Chest Port 1 View  Result Date: 07/31/2020 CLINICAL DATA:  Weakness EXAM: PORTABLE CHEST 1 VIEW COMPARISON:  04/07/2019 FINDINGS: The patient's chin obscures the apices. No focal consolidation or pleural effusion. Stable cardiomediastinal silhouette. No pneumothorax. IMPRESSION: No active disease. Electronically Signed   By: Donavan Foil M.D.   On: 07/31/2020 00:43   ECHOCARDIOGRAM COMPLETE  Result Date: 08/01/2020    ECHOCARDIOGRAM REPORT   Patient Name:   Ivan Parks [facility] Date of Exam: 08/01/2020 Medical Rec #:  [id number]     Height:       65.0 in Accession #:    [id number]    Weight:       157.0 lb Date of Birth:  1941/01/04    BSA:          1.785 m Patient Age:    69 years      BP:           109/53 mmHg Patient Gender: M             HR:           83 bpm. Exam Location:  Inpatient Procedure: 2D Echo, Cardiac Doppler and Color Doppler Indications:    Bacteremia R78.81  History:        Patient has prior history of Echocardiogram examinations. CHF,                 COPD, Signs/Symptoms:Dementia; Risk Factors:Hypertension,                 Diabetes, Dyslipidemia and Sleep Apnea. Acute kidney injury.                 Gram-positive bacteremia. Cellulitis,.  Sonographer:    Darlina Sicilian RDCS Referring Phys: [id number] GREGORY D CALONE  Sonographer Comments: Exam ended per patients request. IMPRESSIONS  1. Left ventricular ejection fraction, by estimation, is 55 to 60%. The left ventricle has normal function. The left ventricle has no regional wall motion abnormalities. There is mild concentric left ventricular hypertrophy. Left ventricular diastolic parameters are consistent with Grade II diastolic dysfunction (pseudonormalization). Elevated left atrial pressure.  2. Right ventricular systolic function is normal. The right  ventricular size is normal.  3. The mitral valve is degenerative. No evidence of mitral valve regurgitation.  4. The aortic valve is tricuspid. There is moderate calcification of the aortic valve. There is moderate thickening of the aortic valve. Aortic valve regurgitation is mild to moderate. Mild to moderate aortic valve sclerosis/calcification is present, without any evidence of aortic stenosis.  5. The inferior vena cava is dilated in size with <50% respiratory variability, suggesting right atrial pressure of 15 mmHg. Conclusion(s)/Recommendation(s): No evidence of valvular vegetations on this transthoracic echocardiogram. Would recommend a transesophageal echocardiogram to exclude infective endocarditis if clinically indicated. FINDINGS  Left Ventricle: Left ventricular ejection fraction, by estimation, is 55 to 60%. The left ventricle has normal function. The left ventricle has no regional wall motion abnormalities. The left ventricular internal cavity size was normal in size. There is  mild concentric left ventricular hypertrophy. Left  ventricular diastolic parameters are consistent with Grade II diastolic dysfunction (pseudonormalization). Elevated left atrial pressure. Right Ventricle: The right ventricular size is normal. No increase in right ventricular wall thickness. Right ventricular systolic function is normal. Left Atrium: Left atrial size was normal in size. Right Atrium: Right atrial size was normal in size. Pericardium: There is no evidence of pericardial effusion. Mitral Valve: The mitral valve is degenerative in appearance. There is moderate thickening of the mitral valve leaflet(s). Mild to moderate mitral annular calcification. No evidence of mitral valve regurgitation. Tricuspid Valve: The tricuspid valve is normal in structure. Tricuspid valve regurgitation is not demonstrated. Aortic Valve: The aortic valve is tricuspid. There is moderate calcification of the aortic valve. There is moderate  thickening of the aortic valve. Aortic valve regurgitation is mild to moderate. Aortic regurgitation PHT measures 538 msec. Mild to moderate aortic valve sclerosis/calcification is present, without any evidence of aortic stenosis. Aortic valve mean gradient measures 6.4 mmHg. Aortic valve peak gradient measures 11.9 mmHg. Pulmonic Valve: The pulmonic valve was grossly normal. Pulmonic valve regurgitation is not visualized. Aorta: The aortic root is normal in size and structure. Venous: The inferior vena cava is dilated in size with less than 50% respiratory variability, suggesting right atrial pressure of 15 mmHg. IAS/Shunts: No atrial level shunt detected by color flow Doppler.  LEFT VENTRICLE PLAX 2D LVIDd:         4.10 cm Diastology LVIDs:         3.70 cm LV e' medial:    5.98 cm/s LV PW:         1.30 cm LV E/e' medial:  15.3 LV IVS:        1.30 cm LV e' lateral:   6.53 cm/s                        LV E/e' lateral: 14.0  RIGHT VENTRICLE RV S prime:     10.70 cm/s TAPSE (M-mode): 1.5 cm LEFT ATRIUM             Index       RIGHT ATRIUM           Index LA diam:        2.40 cm 1.34 cm/m  RA Area:     13.20 cm LA Vol (A2C):   35.7 ml 20.00 ml/m RA Volume:   33.70 ml  18.88 ml/m LA Vol (A4C):   25.8 ml 14.43 ml/m LA Biplane Vol: 36.7 ml 20.56 ml/m  AORTIC VALVE AV Vmax:           172.60 cm/s AV Vmean:          117.400 cm/s AV VTI:            0.313 m AV Peak Grad:      11.9 mmHg AV Mean Grad:      6.4 mmHg LVOT Vmax:         73.05 cm/s LVOT Vmean:        47.100 cm/s LVOT VTI:          0.130 m LVOT/AV VTI ratio: 0.42 AI PHT:            538 msec  AORTA Ao Root diam: 3.50 cm MITRAL VALVE MV Area (PHT): 4.06 cm    SHUNTS MV Decel Time: 187 msec    Systemic VTI: 0.13 m MV E velocity: 91.70 cm/s MV A velocity: 97.30 cm/s MV E/A ratio:  0.94 Mihai Croitoru MD  Electronically signed by Sanda Klein MD Signature Date/Time: 08/01/2020/3:28:18 PM    Final    CT Renal Stone Study  Result Date: 07/31/2020 CLINICAL DATA:   Inguinal pain, leg weakness for 1 month EXAM: CT ABDOMEN AND PELVIS WITHOUT CONTRAST TECHNIQUE: Multidetector CT imaging of the abdomen and pelvis was performed following the standard protocol without IV contrast. COMPARISON:  06/28/2020 FINDINGS: Lower chest: No acute pleural or parenchymal lung disease. Unenhanced CT was performed per clinician order. Lack of IV contrast limits sensitivity and specificity, especially for evaluation of abdominal/pelvic solid viscera. Hepatobiliary: No focal liver abnormality is seen. No gallstones, gallbladder wall thickening, or biliary dilatation. Pancreas: Unremarkable. No pancreatic ductal dilatation or surrounding inflammatory changes. Spleen: Normal in size without focal abnormality. Adrenals/Urinary Tract: No urinary tract calculi or obstructive uropathy. The adrenals and bladder are stable, with no acute findings. Stomach/Bowel: No bowel obstruction or ileus. Normal appendix right lower quadrant. No bowel wall thickening or inflammatory change. Vascular/Lymphatic: Aortic atherosclerosis. No enlarged abdominal or pelvic lymph nodes. Reproductive: Prostate is unremarkable. Partial visualization of right hydrocele again noted. Other: No free fluid or free gas. Small fat containing right inguinal hernia again noted. No bowel herniation. Musculoskeletal: No acute or destructive bony lesions. Reconstructed images demonstrate no additional findings. IMPRESSION: 1. Stable small fat containing right inguinal hernia. 2. Right hydrocele again noted, incompletely evaluated on this exam. 3. No evidence of urinary tract calculi or obstructive uropathy. 4.  Aortic Atherosclerosis (ICD10-I70.0). Electronically Signed   By: Randa Ngo M.D.   On: 07/31/2020 00:42   ECHO TEE  Result Date: 07/29/2020    TRANSESOPHOGEAL ECHO REPORT   Patient Name:   Ivan Parks [facility] Date of Exam: 07/17/2020 Medical Rec #:  [id number]     Height:       65.0 in Accession #:    [id number]    Weight:       157.0  lb Date of Birth:  Jun 27, 1940    BSA:          1.785 m Patient Age:    2 years      BP:           98/55 mmHg Patient Gender: M             HR:           77 bpm. Exam Location:  Inpatient Procedure: Transesophageal Echo Indications:     Bacteremia  History:         Patient has prior history of Echocardiogram examinations, most                  recent 08/01/2020. CHF, COPD; Risk Factors:Current Smoker,                  Dyslipidemia, Hypertension and Diabetes.  Sonographer:     Bernadene Person RDCS Referring Phys:  [id number] Tami Lin [facility] Diagnosing Phys: Skeet Latch MD PROCEDURE: After discussion of the risks and benefits of a TEE, an informed consent was obtained from the patient. The transesophogeal probe was passed without difficulty through the esophogus of the patient. Local oropharyngeal anesthetic was provided with Cetacaine. Sedation performed by different physician. The patient was monitored while under deep sedation. Anesthestetic sedation was provided intravenously by Anesthesiology: 111.88mg  of Propofol, 100mg  of Lidocaine. The patient's vital signs; including heart rate, blood pressure, and oxygen saturation; remained stable throughout the procedure. The patient developed no complications during the procedure. IMPRESSIONS  1. Left ventricular ejection fraction, by estimation, is 60  to 65%. The left ventricle has normal function. The left ventricle has no regional wall motion abnormalities. There is mild concentric left ventricular hypertrophy.  2. Right ventricular systolic function is normal. The right ventricular size is normal.  3. No left atrial/left atrial appendage thrombus was detected.  4. The mitral valve is normal in structure. Trivial mitral valve regurgitation. No evidence of mitral stenosis.  5. 3D images of the tricuspid valve were obtained. Tricuspid valve regurgitation is mild to moderate.  6. The aortic valve is tricuspid. There is mild thickening of the aortic valve. Aortic valve  regurgitation is mild to moderate. No aortic stenosis is present. Conclusion(s)/Recommendation(s): No evidence of vegetation/infective endocarditis on this transesophageal echocardiogram. FINDINGS  Left Ventricle: Left ventricular ejection fraction, by estimation, is 60 to 65%. The left ventricle has normal function. The left ventricle has no regional wall motion abnormalities. The left ventricular internal cavity size was normal in size. There is  mild concentric left ventricular hypertrophy. Right Ventricle: The right ventricular size is normal. No increase in right ventricular wall thickness. Right ventricular systolic function is normal. Left Atrium: Left atrial size was normal in size. No left atrial/left atrial appendage thrombus was detected. Right Atrium: Right atrial size was normal in size. Pericardium: There is no evidence of pericardial effusion. Mitral Valve: The mitral valve is normal in structure. Trivial mitral valve regurgitation. No evidence of mitral valve stenosis. Tricuspid Valve: 3D images of the tricuspid valve were obtained. The tricuspid valve is normal in structure. Tricuspid valve regurgitation is mild to moderate. No evidence of tricuspid stenosis. Aortic Valve: The aortic valve is tricuspid. There is mild thickening of the aortic valve. Aortic valve regurgitation is mild to moderate. No aortic stenosis is present. Pulmonic Valve: The pulmonic valve was normal in structure. Pulmonic valve regurgitation is not visualized. No evidence of pulmonic stenosis. Aorta: The aortic root is normal in size and structure. There is minimal (Grade I) atheroma plaque involving the descending aorta. IAS/Shunts: No atrial level shunt detected by color flow Doppler.  LEFT VENTRICLE PLAX 2D LVOT diam:     2.00 cm LVOT Area:     3.14 cm  TRICUSPID VALVE TR Peak grad:   37.0 mmHg TR Vmax:        304.00 cm/s  SHUNTS Systemic Diam: 2.00 cm Skeet Latch MD Electronically signed by Skeet Latch MD  Signature Date/Time: 08/14/2020/3:07:23 PM    Final    US SCROTUM W/DOPPLER  Result Date: 07/31/2020 CLINICAL DATA:  Scrotal pain. EXAM: SCROTAL ULTRASOUND DOPPLER ULTRASOUND OF THE TESTICLES TECHNIQUE: Complete ultrasound examination of the testicles, epididymis, and other scrotal structures was performed. Color and spectral Doppler ultrasound were also utilized to evaluate blood flow to the testicles. COMPARISON:  CT renal 07/31/2020 FINDINGS: Right testicle Measurements: 3.2 x 2.1 x 2.7 cm. No mass or microlithiasis visualized. Left testicle Measurements: 3.5 x 1.8 x 2.7 cm. No mass or microlithiasis visualized. Right epididymis:  Normal in size and appearance. Left epididymis:  Normal in size and appearance. Hydrocele:  Bilateral hydroceles. Varicocele:  None visualized. Pulsed Doppler interrogation of both testes demonstrates normal low resistance arterial and venous waveforms bilaterally. IMPRESSION: 1. Bilateral hydroceles. 2. Otherwise unremarkable scrotal ultrasound. Electronically Signed   By: Iven Finn M.D.   On: 07/31/2020 04:45   VAS [place] LOWER EXTREMITY VENOUS (DVT)  Result Date: 07/31/2020  Lower Venous DVT Study Indications: Edema.  Risk Factors: CHF, chronic LE edema, known PVD. Limitations: Patient uncooperative with exam - movement. Comparison Study:  No previous venous duplex exams Performing Technologist: Rogelia Rohrer  Examination Guidelines: A complete evaluation includes B-mode imaging, spectral Doppler, color Doppler, and power Doppler as needed of all accessible portions of each vessel. Bilateral testing is considered an integral part of a complete examination. Limited examinations for reoccurring indications may be performed as noted. The reflux portion of the exam is performed with the patient in reverse Trendelenburg.  +---------+---------------+---------+-----------+----------+--------------+ RIGHT    CompressibilityPhasicitySpontaneityPropertiesThrombus Aging  +---------+---------------+---------+-----------+----------+--------------+ CFV      Full           Yes      Yes                                 +---------+---------------+---------+-----------+----------+--------------+ SFJ      Full                                                        +---------+---------------+---------+-----------+----------+--------------+ FV Prox  Full           Yes      Yes                                 +---------+---------------+---------+-----------+----------+--------------+ FV Mid   Full           Yes      Yes                                 +---------+---------------+---------+-----------+----------+--------------+ FV DistalFull           Yes      Yes                                 +---------+---------------+---------+-----------+----------+--------------+ PFV      Full                                                        +---------+---------------+---------+-----------+----------+--------------+ POP      Full           Yes      Yes                                 +---------+---------------+---------+-----------+----------+--------------+ PTV      Full                                                        +---------+---------------+---------+-----------+----------+--------------+ PERO     Full                                                        +---------+---------------+---------+-----------+----------+--------------+   +---------+---------------+---------+-----------+----------+--------------+  LEFT     CompressibilityPhasicitySpontaneityPropertiesThrombus Aging +---------+---------------+---------+-----------+----------+--------------+ CFV      Full           Yes      Yes                                 +---------+---------------+---------+-----------+----------+--------------+ SFJ      Full                                                         +---------+---------------+---------+-----------+----------+--------------+ FV Prox  Full           Yes      Yes                                 +---------+---------------+---------+-----------+----------+--------------+ FV Mid   Full           Yes      Yes                                 +---------+---------------+---------+-----------+----------+--------------+ FV DistalFull           Yes      Yes                                 +---------+---------------+---------+-----------+----------+--------------+ PFV      Full                                                        +---------+---------------+---------+-----------+----------+--------------+ POP      Full           Yes      Yes                                 +---------+---------------+---------+-----------+----------+--------------+ PTV      Full                                                        +---------+---------------+---------+-----------+----------+--------------+ PERO     Full                                                        +---------+---------------+---------+-----------+----------+--------------+     Summary: BILATERAL: - No evidence of deep vein thrombosis seen in the lower extremities, bilaterally. - No evidence of superficial venous thrombosis in the lower extremities, bilaterally. -No evidence of popliteal cyst, bilaterally.   *See table(s) above for measurements and observations. Electronically signed by Curt Jews MD on 07/31/2020 at 4:10:47 PM.    Final    [place]  EKG SITE RITE  Result Date: 08/07/2020 If [facility] image not attached, placement could not be confirmed due to current cardiac rhythm.   Microbiology: No results found for this or any previous visit (from the past 240 hour(s)).   Labs: CBC: Recent Labs  Lab 08/10/2020 0323 08/07/20 0454 08/08/20 1400 08/11/20 0318  WBC 15.8* 12.5* 14.1* 19.6*  NEUTROABS  --   --  11.8*  --   HGB 11.4* 12.4* 11.6* 11.9*  HCT  39.1 43.3 41.0 42.4  MCV 93.3 96.2 97.4 98.6  PLT 169 200 198 986   Basic Metabolic Panel: Recent Labs  Lab 07/21/2020 0323 08/07/20 0454 08/08/20 1400 08/10/20 0330 08/11/20 0318  NA 137 139 138 135 136  K 4.8 4.9 4.9 5.1 5.1  CL 99 103 101 100 102  CO2 29 28 33* 31 31  GLUCOSE 237* 214* 209* 253* 182*  BUN 36* 37* 35* 51* 60*  CREATININE 1.62* 1.77* 1.62* 2.01* 2.65*  CALCIUM 8.4* 8.2* 7.8* 8.1* 8.2*  MG  --   --  1.8 1.9 2.0  PHOS  --   --   --  3.2  --    Liver Function Tests: Recent Labs  Lab 08/08/20 1400  AST 12*  ALT 7  ALKPHOS 77  BILITOT 0.3  PROT 6.4*  ALBUMIN 1.7*   Cardiac Enzymes: No results for input(s): CKTOTAL, CKMB, CKMBINDEX, TROPONINI in the last 168 hours.  Time spent: 35 minutes  Signed:  Berle Mull  [facility]

## 2020-08-16 NOTE — Plan of Care (Signed)
  Problem: Coping: Goal: Level of anxiety will decrease Outcome: Progressing   Problem: Safety: Goal: Non-violent Restraint(s) Outcome: Progressing

## 2020-08-16 NOTE — Progress Notes (Signed)
Continues on comfort care. No s/s of pain or discomfort. Slept most of the shift. Had a period of yelling but calmed down with Ativan. Continue to monitor.

## 2020-08-16 NOTE — Telephone Encounter (Signed)
Pt daughter called to let you know this pt has passed away today.

## 2020-08-16 NOTE — Progress Notes (Signed)
Family at bedside, aware of patient condition of comfort care. Patient respiration shallow and heart rate at the low 25 beats per min.

## 2020-08-16 NOTE — Progress Notes (Signed)
Patient expired at 0943 am. Family at bedside and MD notiifed.

## 2020-08-16 NOTE — Progress Notes (Signed)
Patient ID: Ivan Parks, male   DOB: [birth year], 80 y.o.   MRN: [id number]  Medical  records reviewed  80 y.o. male   admitted on 18-Aug-2020 with  history of diabetes mellitus type 2, diastolic CHF, cognitive deficit, chronic venous insufficiency with chronic edema, chronic kidney disease stage III baseline creatinine around 1.3, COPD on home oxygen presents to the ER for the second time in the last 1 month with complaints of right groin pain.   Patient with left gluteal cellulitis/MRSA bacteremia, continued poor oral intake and overall failure to thrive.      Decision to shift to full comfort path made with attending yesterday.    This NP visited at bedside for palliative medicine needs and emotional support. Large family gathered.    Visiting with family, education offered  regarding natural trajectory and expectations at EOL, Mr Ivan Parks passed/expired.   Nursing at bedside.   Family actively grieving, emotional support and presence offered.  Chaplain  support offered but denied at this time.   Questions and concerns addressed   Discussed with bedside RN  Total time spent on the unit was 15 minutes  Greater than 50% of the time was spent in counseling and coordination of care  Lorinda Creed NP  Palliative Medicine Team Team Phone # [phone] Pager [phone]

## 2020-08-16 NOTE — Progress Notes (Signed)
[facility] called reference number [id number]. Not a suitable donor.

## 2020-08-16 DEATH — deceased

## 2020-08-19 LAB — VITAMIN B1: Vitamin B1 (Thiamine): 188.7 nmol/L (ref 66.5–200.0)

## 2020-08-22 ENCOUNTER — Inpatient Hospital Stay: Payer: HMO | Admitting: Infectious Disease

## 2020-08-23 ENCOUNTER — Ambulatory Visit: Payer: HMO | Admitting: Physician Assistant

## 2020-08-27 ENCOUNTER — Ambulatory Visit: Payer: Self-pay
